# Patient Record
Sex: Male | Born: 1935
Health system: Southern US, Community
[De-identification: ages and names within clinical notes are randomized; demographics above are authoritative.]

## PROBLEM LIST (undated history)

## (undated) DIAGNOSIS — K219 Gastro-esophageal reflux disease without esophagitis: Secondary | ICD-10-CM

## (undated) DIAGNOSIS — E119 Type 2 diabetes mellitus without complications: Secondary | ICD-10-CM

## (undated) DIAGNOSIS — I2510 Atherosclerotic heart disease of native coronary artery without angina pectoris: Secondary | ICD-10-CM

## (undated) DIAGNOSIS — M1990 Unspecified osteoarthritis, unspecified site: Secondary | ICD-10-CM

## (undated) DIAGNOSIS — I219 Acute myocardial infarction, unspecified: Secondary | ICD-10-CM

## (undated) DIAGNOSIS — E785 Hyperlipidemia, unspecified: Secondary | ICD-10-CM

## (undated) DIAGNOSIS — Z8719 Personal history of other diseases of the digestive system: Secondary | ICD-10-CM

## (undated) DIAGNOSIS — I10 Essential (primary) hypertension: Secondary | ICD-10-CM

## (undated) DIAGNOSIS — G8929 Other chronic pain: Secondary | ICD-10-CM

## (undated) DIAGNOSIS — I639 Cerebral infarction, unspecified: Secondary | ICD-10-CM

## (undated) DIAGNOSIS — J069 Acute upper respiratory infection, unspecified: Secondary | ICD-10-CM

## (undated) DIAGNOSIS — G459 Transient cerebral ischemic attack, unspecified: Secondary | ICD-10-CM

## (undated) DIAGNOSIS — I723 Aneurysm of iliac artery: Secondary | ICD-10-CM

## (undated) DIAGNOSIS — M549 Dorsalgia, unspecified: Secondary | ICD-10-CM

## (undated) DIAGNOSIS — J45909 Unspecified asthma, uncomplicated: Secondary | ICD-10-CM

## (undated) DIAGNOSIS — I672 Cerebral atherosclerosis: Secondary | ICD-10-CM

## (undated) HISTORY — DX: Transient cerebral ischemic attack, unspecified: G45.9

## (undated) HISTORY — DX: Essential (primary) hypertension: I10

## (undated) HISTORY — PX: KNEE CARTILAGE SURGERY: SHX688

## (undated) HISTORY — DX: Cerebral atherosclerosis: I67.2

## (undated) HISTORY — DX: Atherosclerotic heart disease of native coronary artery without angina pectoris: I25.10

## (undated) HISTORY — PX: SHOULDER ARTHROSCOPY W/ ROTATOR CUFF REPAIR: SHX2400

## (undated) HISTORY — PX: ANAL FISSURE REPAIR: SHX2312

## (undated) HISTORY — DX: Hyperlipidemia, unspecified: E78.5

## (undated) HISTORY — PX: CATARACT EXTRACTION W/ INTRAOCULAR LENS  IMPLANT, BILATERAL: SHX1307

## (undated) HISTORY — DX: Aneurysm of iliac artery: I72.3

## (undated) HISTORY — DX: Unspecified asthma, uncomplicated: J45.909

## (undated) HISTORY — DX: Cerebral infarction, unspecified: I63.9

## (undated) HISTORY — DX: Unspecified osteoarthritis, unspecified site: M19.90

## (undated) HISTORY — DX: Acute upper respiratory infection, unspecified: J06.9

---

## 1994-08-03 DIAGNOSIS — I219 Acute myocardial infarction, unspecified: Secondary | ICD-10-CM

## 1994-08-03 HISTORY — DX: Acute myocardial infarction, unspecified: I21.9

## 1994-08-03 HISTORY — PX: CORONARY ANGIOPLASTY: SHX604

## 2002-01-30 ENCOUNTER — Encounter: Payer: Self-pay | Admitting: Family Medicine

## 2002-01-30 ENCOUNTER — Encounter: Admission: RE | Admit: 2002-01-30 | Discharge: 2002-01-30 | Payer: Self-pay | Admitting: Family Medicine

## 2002-02-21 ENCOUNTER — Encounter: Payer: Self-pay | Admitting: Specialist

## 2002-02-21 ENCOUNTER — Encounter: Admission: RE | Admit: 2002-02-21 | Discharge: 2002-02-21 | Payer: Self-pay | Admitting: Specialist

## 2002-02-23 ENCOUNTER — Ambulatory Visit (HOSPITAL_BASED_OUTPATIENT_CLINIC_OR_DEPARTMENT_OTHER): Admission: RE | Admit: 2002-02-23 | Discharge: 2002-02-24 | Payer: Self-pay | Admitting: Specialist

## 2002-04-06 ENCOUNTER — Encounter: Admission: RE | Admit: 2002-04-06 | Discharge: 2002-05-26 | Payer: Self-pay | Admitting: Specialist

## 2003-02-27 ENCOUNTER — Ambulatory Visit (HOSPITAL_COMMUNITY): Admission: RE | Admit: 2003-02-27 | Discharge: 2003-02-27 | Payer: Self-pay | Admitting: Gastroenterology

## 2005-10-07 ENCOUNTER — Ambulatory Visit: Payer: Self-pay | Admitting: Cardiology

## 2007-04-20 ENCOUNTER — Ambulatory Visit: Payer: Self-pay | Admitting: Cardiology

## 2007-05-24 ENCOUNTER — Ambulatory Visit: Payer: Self-pay

## 2008-08-08 ENCOUNTER — Ambulatory Visit: Payer: Self-pay | Admitting: Cardiology

## 2009-05-30 ENCOUNTER — Encounter (INDEPENDENT_AMBULATORY_CARE_PROVIDER_SITE_OTHER): Payer: Self-pay | Admitting: *Deleted

## 2009-06-04 ENCOUNTER — Encounter: Payer: Self-pay | Admitting: Cardiology

## 2009-06-04 DIAGNOSIS — I672 Cerebral atherosclerosis: Secondary | ICD-10-CM | POA: Insufficient documentation

## 2009-06-05 ENCOUNTER — Ambulatory Visit: Payer: Self-pay

## 2009-06-05 ENCOUNTER — Encounter: Payer: Self-pay | Admitting: Cardiovascular Disease

## 2009-06-05 ENCOUNTER — Encounter: Payer: Self-pay | Admitting: Cardiology

## 2009-06-05 DIAGNOSIS — I10 Essential (primary) hypertension: Secondary | ICD-10-CM | POA: Insufficient documentation

## 2009-06-05 DIAGNOSIS — E1159 Type 2 diabetes mellitus with other circulatory complications: Secondary | ICD-10-CM | POA: Insufficient documentation

## 2009-08-28 ENCOUNTER — Ambulatory Visit: Payer: Self-pay | Admitting: Cardiology

## 2009-08-28 DIAGNOSIS — E1169 Type 2 diabetes mellitus with other specified complication: Secondary | ICD-10-CM

## 2009-08-28 DIAGNOSIS — I2510 Atherosclerotic heart disease of native coronary artery without angina pectoris: Secondary | ICD-10-CM

## 2009-08-28 DIAGNOSIS — J069 Acute upper respiratory infection, unspecified: Secondary | ICD-10-CM | POA: Insufficient documentation

## 2009-08-28 DIAGNOSIS — I25110 Atherosclerotic heart disease of native coronary artery with unstable angina pectoris: Secondary | ICD-10-CM | POA: Insufficient documentation

## 2009-08-28 DIAGNOSIS — E785 Hyperlipidemia, unspecified: Secondary | ICD-10-CM | POA: Insufficient documentation

## 2010-06-10 ENCOUNTER — Telehealth (INDEPENDENT_AMBULATORY_CARE_PROVIDER_SITE_OTHER): Payer: Self-pay | Admitting: *Deleted

## 2010-07-04 ENCOUNTER — Encounter: Payer: Self-pay | Admitting: Cardiology

## 2010-07-17 ENCOUNTER — Encounter: Payer: Self-pay | Admitting: Cardiovascular Disease

## 2010-07-17 DIAGNOSIS — I723 Aneurysm of iliac artery: Secondary | ICD-10-CM

## 2010-07-18 ENCOUNTER — Encounter: Payer: Self-pay | Admitting: Cardiovascular Disease

## 2010-07-18 ENCOUNTER — Ambulatory Visit: Payer: Self-pay

## 2010-09-02 NOTE — Progress Notes (Signed)
Summary: Records Request   Faxed Carotid to [facility] ([phone]). Debby Freiberg  June 10, 2010 4:03 PM

## 2010-09-02 NOTE — Assessment & Plan Note (Signed)
Summary: [facility]  Medications Added METOPROLOL TARTRATE 100 MG TABS (METOPROLOL TARTRATE) 1 by mouth two times a day AMLODIPINE BESY-BENAZEPRIL HCL 5-10 MG CAPS (AMLODIPINE BESY-BENAZEPRIL HCL) 1 by mouth daily SIMCOR 1000-20 MG XR24H-TAB (NIACIN-SIMVASTATIN) 1 by mouth daily NITROSTAT 0.4 MG SUBL (NITROGLYCERIN) as needed CELEBREX 200 MG CAPS (CELECOXIB) as needed HYDROCHLOROTHIAZIDE 25 MG TABS (HYDROCHLOROTHIAZIDE) 1/2 by mouth daily MELOXICAM 15 MG TABS (MELOXICAM) 1/2 by mouth daily ADVAIR DISKUS 100-50 MCG/DOSE AEPB (FLUTICASONE-SALMETEROL) 2 puffs daily PROAIR HFA 108 (90 BASE) MCG/ACT AERS (ALBUTEROL SULFATE) as needed VITAMIN D 1000 UNIT TABS (CHOLECALCIFEROL) 1 by mouth weekly ASPIRIN 81 MG  TABS (ASPIRIN) 1 by mouth daily FISH OIL   OIL (FISH OIL) 2 by mouth dialy AZITHROMYCIN 1 GM PACK (AZITHROMYCIN) as directed1 AZITHROMYCIN 1 GM PACK (AZITHROMYCIN) as directed      Allergies Added: NKDA  Visit Type:  Follow-up Primary Provider:  Dr. Vernon Prey  CC:  CAD.  History of Present Illness: The patient presents for yearly followup. Since I last saw him he has had no new cardiovascular problems. 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 With this he denies any chest discomfort, neck or arm discomfort. He has none of the excessive diaphoresis or "hungry feeling" that was his angina at the time of his infarct. He denies any shortness of breath, PND or orthopnea. He has had a cough and wheezing for a couple of weeks. He denies any palpitations, presyncope or syncope. He's had no weight gain or edema.  Current Medications (verified): 1)  Metoprolol Tartrate 100 Mg Tabs (Metoprolol Tartrate) .Marland Kitchen.. 1 By Mouth Two Times A Day 2)  Amlodipine Besy-Benazepril Hcl 5-10 Mg Caps (Amlodipine Besy-Benazepril Hcl) .Marland Kitchen.. 1 By Mouth Daily 3)  Simcor 1000-20 Mg Xr24h-Tab (Niacin-Simvastatin) .Marland Kitchen.. 1 By Mouth Daily 4)  Nitrostat 0.4 Mg Subl (Nitroglycerin) .... As Needed 5)  Celebrex 200 Mg Caps  (Celecoxib) .... As Needed 6)  Hydrochlorothiazide 25 Mg Tabs (Hydrochlorothiazide) .... 1/2 By Mouth Daily 7)  Meloxicam 15 Mg Tabs (Meloxicam) .... 1/2 By Mouth Daily 8)  Advair Diskus 100-50 Mcg/dose Aepb (Fluticasone-Salmeterol) .... 2 Puffs Daily 9)  Proair Hfa 108 (90 Base) Mcg/act Aers (Albuterol Sulfate) .... As Needed 10)  Vitamin D 1000 Unit Tabs (Cholecalciferol) .Marland Kitchen.. 1 By Mouth Weekly 11)  Aspirin 81 Mg  Tabs (Aspirin) .Marland Kitchen.. 1 By Mouth Daily 12)  Fish Oil   Oil (Fish Oil) .... 2 By Mouth Dialy  Allergies (verified): No Known Drug Allergies  Past History:  Past Medical History: Reviewed history from 08/16/2009 and no changes required.  1. Coronary artery disease (myocardial infarction at [facility] in       1996, 90% mid LAD stenosis, 60-70% proximal circumflex stenosis,       sequential 50% stenosis, proximal mid right coronary artery.  He       had an atherectomy and angioplasty of the LAD.  He had normal       ejection fraction).   2. Asthma.   3. Hypertension.   4. Hyperlipidemia.   5. Bilateral carotid artery plaque.   6. Lens implant.   7. Right knee surgery.   8. Repair of a rectal fistula.   Past Surgical History: Lens implant.   Right knee surgery.   Repair of a rectal  fistula.   Review of Systems       As stated in the HPI and negative for all other systems.   Vital Signs:  Patient profile:   77 year old male  Height:      69 inches Weight:      207 pounds BMI:     30.68 Pulse rate:   63 / minute Resp:     18 per minute BP sitting:   142 / 80  (right arm)  Vitals Entered By: Marrion Coy, CNA (August 28, 2009 10:13 AM)  Physical Exam  General:  Well developed, well nourished, in no acute distress. Head:  normocephalic and atraumatic Eyes:  PERRLA/EOM intact; conjunctiva and lids normal. Neck:  Neck supple, no JVD. No masses, thyromegaly or abnormal cervical nodes. Chest Wall:  no deformities or breast masses noted Lungs:  diffuse  expiratory wheezes and decreased breath sounds no crackles Abdomen:  Bowel sounds positive; abdomen soft and non-tender without masses, organomegaly, or hernias noted. No hepatosplenomegaly. Msk:  Back normal, normal gait. Muscle strength and tone normal. Extremities:  No clubbing or cyanosis. Neurologic:  Alert and oriented x 3. Skin:  Intact without lesions or rashes. Axillary Nodes:  no significant adenopathy Inguinal Nodes:  no significant adenopathy Psych:  Normal affect.   Detailed Cardiovascular Exam  Neck    Carotids: Carotids full and equal bilaterally without bruits.      Neck Veins: Normal, no JVD.    Heart    Inspection: no deformities or lifts noted.      Palpation: normal PMI with no thrills palpable.      Auscultation: regular rate and rhythm, S1, S2 without murmurs, rubs, gallops, or clicks.    Vascular    Abdominal Aorta: no palpable masses, pulsations, or audible bruits.      Femoral Pulses: normal femoral pulses bilaterally.      Pedal Pulses: normal pedal pulses bilaterally.      Radial Pulses: normal radial pulses bilaterally.      Peripheral Circulation: no clubbing, cyanosis, or edema noted with normal capillary refill.     Impression & Recommendations:  Problem # 1:  CORONARY ATHEROSCLEROSIS NATIVE CORONARY ARTERY (ICD-414.01)  The patient is having no new symptoms. No further cardiovascular testing is suggested. He exerts himself vigorously daily and I will consider this his stress test. He does need continued aggressive risk reduction. Orders: EKG w/ Interpretation (93000)  His updated medication list for this problem includes:    Metoprolol Tartrate 100 Mg Tabs (Metoprolol tartrate) .Marland Kitchen... 1 by mouth two times a day    Amlodipine Besy-benazepril Hcl 5-10 Mg Caps (Amlodipine besy-benazepril hcl) .Marland Kitchen... 1 by mouth daily    Nitrostat 0.4 Mg Subl (Nitroglycerin) .Marland Kitchen... As needed    Aspirin 81 Mg Tabs (Aspirin) .Marland Kitchen... 1 by mouth daily  Problem # 2:   HYPERTENSION (ICD-401.9)  His updated medication list for this problem includes:    Metoprolol Tartrate 100 Mg Tabs (Metoprolol tartrate) .Marland Kitchen... 1 by mouth two times a day    Amlodipine Besy-benazepril Hcl 5-10 Mg Caps (Amlodipine besy-benazepril hcl) .Marland Kitchen... 1 by mouth daily    Hydrochlorothiazide 25 Mg Tabs (Hydrochlorothiazide) .Marland Kitchen... 1/2 by mouth daily    Aspirin 81 Mg Tabs (Aspirin) .Marland Kitchen... 1 by mouth daily  Problem # 3:  UPPER RESPIRATORY INFECTION (ICD-465.9)  The patient has been battling cough and congestion consistent with a possible bacterial upper respiratory infection for the past 10 days or so. I will go ahead and take the liberty of giving him a Z-Pak and asked him to follow with his primary physician.  His updated medication list for this problem includes:    Advair Diskus 100-50 Mcg/dose Aepb (Fluticasone-salmeterol) .Marland Kitchen... 2 puffs  daily    Proair Hfa 108 (90 Base) Mcg/act Aers (Albuterol sulfate) .Marland Kitchen... As needed    Azithromycin 1 Gm Pack (Azithromycin) .Marland Kitchen... As directed  Problem # 4:  DYSLIPIDEMIA (ICD-272.4)  I reviewed his lipid profile. His LDL was 61 and HDL 45. This is excellent .  His updated medication list for this problem includes:    Simcor 1000-20 Mg Xr24h-tab (Niacin-simvastatin) .Marland Kitchen... 1 by mouth daily  Patient Instructions: 1)  Your physician recommends that you schedule a follow-up appointment in: 1 yr in [place] with Dr Antoine Poche 2)  Your physician recommends that you continue on your current medications as directed. Please refer to the Current Medication list given to you today. Prescriptions: AZITHROMYCIN 1 GM PACK (AZITHROMYCIN) as directed  #1 x 0   Entered by:   Charolotte Capuchin, RN   Authorized by:   Rollene Rotunda, MD, [facility]   Signed by:   Charolotte Capuchin, RN on 08/28/2009   Method used:   Electronically to        [facility] [phone]* (retail)       [address] [place].       [place], [place]  [address]       Ph: [phone]       Fax:  [phone]   RxID:   [id number] AZITHROMYCIN 1 GM PACK (AZITHROMYCIN) as directed1  #1 x 0   Entered by:   Charolotte Capuchin, RN   Authorized by:   Rollene Rotunda, MD, [facility]   Signed by:   Charolotte Capuchin, RN on 08/28/2009   Method used:   Electronically to        [facility] [phone]* (retail)       [address] [place].       [place], [place]  [address]       Ph: [phone]       Fax: [phone]   RxID:   [id number]

## 2010-09-04 NOTE — Miscellaneous (Signed)
Summary: Orders Update  Clinical Lists Changes  Problems: Added new problem of ILIAC ARTERY ANEURYSM (ICD-442.2) Orders: Added new Test order of Abdominal Aorta Duplex (Abd Aorta Duplex) - Signed

## 2010-11-19 ENCOUNTER — Ambulatory Visit (INDEPENDENT_AMBULATORY_CARE_PROVIDER_SITE_OTHER): Payer: Self-pay | Admitting: Cardiology

## 2010-11-19 ENCOUNTER — Encounter: Payer: Self-pay | Admitting: Cardiology

## 2010-11-19 VITALS — BP 156/84 | HR 64 | Ht 72.0 in | Wt 210.0 lb

## 2010-11-19 DIAGNOSIS — I672 Cerebral atherosclerosis: Secondary | ICD-10-CM

## 2010-11-19 DIAGNOSIS — I10 Essential (primary) hypertension: Secondary | ICD-10-CM

## 2010-11-19 DIAGNOSIS — I723 Aneurysm of iliac artery: Secondary | ICD-10-CM

## 2010-11-19 DIAGNOSIS — I2510 Atherosclerotic heart disease of native coronary artery without angina pectoris: Secondary | ICD-10-CM

## 2010-11-19 DIAGNOSIS — E785 Hyperlipidemia, unspecified: Secondary | ICD-10-CM

## 2010-11-19 MED ORDER — HYDROCHLOROTHIAZIDE 25 MG PO TABS: ORAL_TABLET | ORAL | Status: DC

## 2010-11-19 NOTE — Assessment & Plan Note (Signed)
I do not have a copy of the most recent lipids but this is usually followed expertly by his primary providers and I will defer to their management.

## 2010-11-19 NOTE — Assessment & Plan Note (Signed)
He had a mild plaque bilateral November 2010 and is due for followup again this winter.

## 2010-11-19 NOTE — Assessment & Plan Note (Addendum)
I reviewed his BP diary and his BPs are for the most part less than 140/90.  Of note  he had been instructed to increase his hydrochlorothiazide to 25 mg Monday Wednesday and Friday and I reviewed this with him and he will make this change.

## 2010-11-19 NOTE — Patient Instructions (Signed)
Increase Hydrochlorothiazide to 25 mg a day on Monday, Wednesday and Friday.  Continue other medications as ordered. You are being scheduled for a Lexiscan myoview.  Please follow the instruction sheet given. You are to have a carotid doppler and Abdominal ultrasound in November.  You will be called with an appointment however if you do not receive a call, please call the office at [phone] to schedule. Follow up with Dr Antoine Poche in 1 year in the [place] office.

## 2010-11-19 NOTE — Assessment & Plan Note (Signed)
He had small abdominal aortic aneurysm and iliac aneurysms and is due for followup this December

## 2010-11-19 NOTE — Progress Notes (Signed)
HPI The patient presents for followup of his known coronary disease. Since I last saw him he has had no acute complaints. He tries to stay active he somewhat limited by knee pain. 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 He does not have palpitations, presyncope or syncope. He has not had any new shortness of breath, PND or orthopnea. He has had no weight gain or edema.   Allergies  Allergen Reactions  . Codeine Other (See Comments)    Bad dreams    Current Outpatient Prescriptions  Medication Sig Dispense Refill  . Albuterol Sulfate (PROAIR HFA IN) Inhale into the lungs as directed.        Marland Kitchen amLODipine-benazepril (LOTREL) 5-10 MG per capsule Take 1 capsule by mouth daily.        Marland Kitchen aspirin 81 MG tablet Take 81 mg by mouth daily.        . Cholecalciferol (VITAMIN D3) 1000 UNITS CAPS Take by mouth once a week.        . fish oil-omega-3 fatty acids 1000 MG capsule Take 2 g by mouth daily.        . Fluticasone-Salmeterol (ADVAIR DISKUS) 100-50 MCG/DOSE AEPB Inhale 1 puff into the lungs every 12 (twelve) hours.        . hydrochlorothiazide 25 MG tablet Take 12.5 mg by mouth daily.        . meloxicam (MOBIC) 15 MG tablet Take 7.5 mg by mouth daily.        . metFORMIN (GLUMETZA) 500 MG (MOD) 24 hr tablet Take 500 mg by mouth daily with breakfast.        . metoprolol (LOPRESSOR) 100 MG tablet Take 100 mg by mouth 2 (two) times daily.        . niacin (NIASPAN) 1000 MG CR tablet Take 1,000 mg by mouth at bedtime.        . nitroGLYCERIN (NITROSTAT) 0.4 MG SL tablet Place 0.4 mg under the tongue every 5 (five) minutes as needed.        . rosuvastatin (CRESTOR) 10 MG tablet Take 10 mg by mouth daily.        . celecoxib (CELEBREX) 200 MG capsule Take 200 mg by mouth as needed.        Marland Kitchen DISCONTD: niacin-simvastatin (SIMCOR) 1000-20 MG 24 hr tablet Take 1 tablet by mouth daily.          Past Medical History  Diagnosis Date  .  DYSLIPIDEMIA   . HYPERTENSION   . CORONARY ATHEROSCLEROSIS NATIVE CORONARY ARTERY   . Cerebral atherosclerosis   . UPPER RESPIRATORY INFECTION   . ILIAC ARTERY ANEURYSM     Past Surgical History  Procedure Date  . Intraocular lens insertion   . Right knee surgery   . Repair of rectal fistula     ROS: As stated in the HPI and negative for all other systems.  PHYSICAL EXAM BP 156/84  Pulse 64  Ht 6' (1.829 m)  Wt 210 lb (95.255 kg)  BMI 28.48 kg/m2 GENERAL:  Well appearing HEENT:  Pupils equal round and reactive, fundi not visualized, oral mucosa unremarkable NECK:  No jugular venous distention, waveform within normal limits, carotid upstroke brisk and symmetric, right bruit, no thyromegaly LYMPHATICS:  No cervical, inguinal adenopathy LUNGS:  Clear to auscultation bilaterally BACK:  No CVA tenderness CHEST:  Unremarkable HEART:  PMI not displaced or sustained,S1 and S2 within normal limits, no S3, no  S4, no clicks, no rubs, no murmurs ABD:  Flat, positive bowel sounds normal in frequency in pitch, no bruits, no rebound, no guarding, no midline pulsatile mass, no hepatomegaly, no splenomegaly EXT:  2 plus pulses throughout, no edema, no cyanosis no clubbing SKIN:  No rashes no nodules NEURO:  Cranial nerves II through XII grossly intact, motor grossly intact throughout PSYCH:  Cognitively intact, oriented to person place and time  EKG:  Sinus rhythm, rate 65, axis within normal limits, intervals within normal, no acute ST-T wave changes.  ASSESSMENT AND PLAN

## 2010-11-19 NOTE — Assessment & Plan Note (Signed)
She is not as physically active as he used to be. It has been many years since his PCI and stenting of stress testing. This is indicated that he would not be a little on the treadmill so he will have pharmacologic perfusion imaging.

## 2010-11-20 ENCOUNTER — Encounter: Payer: Self-pay | Admitting: Cardiology

## 2010-11-20 NOTE — Progress Notes (Signed)
Addended by: Deliah Goody on: 11/20/2010 02:06 PM   Modules accepted: Orders

## 2010-12-02 ENCOUNTER — Encounter (HOSPITAL_COMMUNITY): Payer: Self-pay | Admitting: Radiology

## 2010-12-16 NOTE — Assessment & Plan Note (Signed)
[facility]                            CARDIOLOGY OFFICE NOTE   CORNEL, [name]                         MRN:          [id number]  DATE:08/08/2008                            DOB:          [birth year]    PRIMARY CARE PHYSICIAN:  Ernestina Penna, MD.   REASON FOR PRESENTATION:  Evaluate the patient with coronary artery  disease, hypertension, and dyslipidemia.   HISTORY OF PRESENT ILLNESS:  The patient presents for followup of the  above.  This is his yearly followup.  He is now 77 years old.  In the  last year, he has done well.  He does not exercise as much as I would  like, though he occasionally pedals a bicycle.  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  With this level of activity, he does not get any chest  pressure, neck, or arm discomfort.  He does not have any palpitation,  presyncope, or syncope.  He has no PND or orthopnea.  He has noted that  his blood pressure at home is running, elevated in the 140s-150s  routinely.   PAST MEDICAL HISTORY:  1. Coronary artery disease (myocardial infarction at [facility] in      1996, 90% mid LAD stenosis, 60-70% proximal circumflex stenosis,      sequential 50% stenosis, proximal mid right coronary artery.  He      had an atherectomy and angioplasty of the LAD.  He had normal      ejection fraction).  2. Asthma.  3. Hypertension.  4. Hyperlipidemia.  5. Bilateral carotid artery plaque.  6. Lens implant.  7. Right knee surgery.  8. Repair of a rectal fistula.   ALLERGIES:  Voltaren.   MEDICATIONS:  1. Metoprolol 100 mg b.i.d.  2. Advair.  3. Aspirin 81 mg daily.  4. Vitamin E.  5. Hydrochlorothiazide.  6. Fish oil.  7. Prilosec.  8. Advicor 1000/20.  9. Lotrel 5/10.  10.Mobic 7.5 mg daily.   REVIEW OF SYSTEMS:  As stated in the HPI and otherwise negative for all  other systems.   PHYSICAL EXAMINATION:  GENERAL:  The patient is pleasant and in no  distress.  VITAL SIGNS:  Blood pressure  142/76, heart rate 66 and regular, and  weight 215 pounds.  HEENT:  Eyelids unremarkable.  Pupils equal, round, and reactive to  light.  Fundi not visualized.  Oral mucosa unremarkable.  NECK:  No jugular venous distention at 45 degrees.  Carotid upstroke  brisk and symmetrical.  No bruits.  No thyromegaly.  LYMPHATICS:  No cervical, axillary, or inguinal adenopathy.  LUNGS:  Clear to auscultation bilaterally.  BACK:  No costovertebral angle tenderness.  CHEST:  Unremarkable.  HEART:  PMI not displaced or sustained.  S1 and S2 within normal limits.  No S3, no S4.  No clicks, no rubs, no murmurs.  ABDOMEN:  Obese, positive bowel sounds.  Normal in frequency and pitch.  No bruits, no rebound, no guarding or midline pulsatile mass.  No  hepatomegaly.  No splenomegaly.  SKIN:  No rashes, no nodules,  some skin cancers with one on his left  forearm.  NEUROLOGIC:  Oriented to person, place, and time.  Cranial nerves II  through XII grossly intact.  Motor grossly intact.   EKG, sinus rhythm, rate 66, axis within normal limits, intervals within  normal limits, no acute ST-T wave changes.   ASSESSMENT AND PLAN:  1. Coronary artery disease.  The patient is having no ongoing angina.      At this point, no further cardiovascular testing is suggested.      Rather, he should continue with aggressive risk reduction.  2. Hypertension.  Blood pressure is elevated and not at target.      Therefore, I am going to take the liberty of increasing his Lotrel      to 5/20.  When he starts this 2 weeks later, he will get a BMET.      Its okay for him to use his 2 weeks of the 5/10 prescription first.  3. Dyslipidemia.  This is followed closely by Dr. Christell Constant with a goal      LDL less than 100 and HDL greater than 40.  4. Weight.  The patient is overweight.  This has been stable.  We have      discussed the need for weight loss.  His body mass index is 29.  5. Followup.  I think he could come back in 1 year or  sooner if      needed.     Rollene Rotunda, MD, [facility]  Electronically Signed    JH/MedQ  DD: 08/08/2008  DT: 08/09/2008  Job #: [id number]   cc:   Ernestina Penna, M.D.

## 2010-12-16 NOTE — Assessment & Plan Note (Signed)
[facility]                            CARDIOLOGY OFFICE NOTE   RANGEL, ECHEVERRI                         MRN:          [id number]  DATE:04/20/2007                            DOB:          [birth year]    PRIMARY:  Ernestina Penna, M.D.   REASON FOR PRESENTATION:  Evaluate the patient with coronary artery  disease.   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  He returns for 28-month followup.  Since I last saw him, he  has been doing relatively well.  He has gained about 15 pounds because  he eats too much.  He does do work and raises horses.  He gets on an  exercise bike that he keeps in his yard.  He does that a couple of times  a week for about 12 minutes.  He says with this level of activity,  pedaling the bicycle quickly, he does not get any chest discomfort, neck  or arm discomfort.  He does not have any palpitations, pre-syncope, or  syncope.  He denies any PND or orthopnea.   PAST MEDICAL HISTORY:  Coronary artery disease (myocardial infarction at  [facility] in 1996, 90% mid LAD stenosis, 60-70% proximal circumflex  stenosis, sequential 50% stenosis, proximal mid right coronary artery.  He had atherectomy and angioplasty of the LAD.  He had a normal ejection  fraction).  Asthma.  Hypertension.  Hyperlipidemia.  Bilateral cataract  artery plaque.  Lens implant.  Right knee surgery.  Repair of a rectal  fistula.   ALLERGIES:  VOLTAREN.   MEDICATIONS:  1. Advicor 1000/20 mg daily.  2. Hydrochlorothiazide 12 mg daily.  3. Metoprolol 100 mg b.i.d.  4. Fish oil.  5. Aspirin 325 mg daily.  6. Mucinex.  7. Celebrex.  8. Prilosec.  9. Lotrel 5/10 daily.  10.Glucosamine.  11.Vitamin C and D.   REVIEW OF SYSTEMS:  As stated in the HPI and otherwise negative for  other systems.   PHYSICAL EXAMINATION:  The patient is in no distress.  Blood pressure 136/78, heart rate 64 and regular, weight 215  pounds,  body mass index 29.  HEENT:  Eyelids unremarkable.  Pupils are equal, round, and reactive to  light and accommodation.  Fundi are not visualized.  Oral mucosa  unremarkable.  NECK:  No jugular venous distension at 45 degrees, carotid upstroke  brisk and symmetric, right carotid bruit, no thyromegaly.  LYMPHATICS:  No cervical, axillary, or inguinal adenopathy.  LUNGS:  Clear to auscultation bilaterally.  BACK:  No costovertebral angle tenderness.  CHEST:  Unremarkable.  HEART:  PMI not displaced or sustained, S1 and S2 within normal limits,  no S3, no murmurs.  ABDOMEN:  Obese, positive bowel sounds, normal in frequency and pitch,  no bruits, rebound, guarding.  No midline pulsatile masses,  hepatomegaly, splenomegaly.  SKIN:  No rashes, no nodules.  EXTREMITIES:  With 2+ pulses throughout, no edema, cyanosis, clubbing.  NEURO:  Oriented to person, place, and time, cranial nerves 2-12 grossly  intact, motor grossly intact throughout.  EKG:  Sinus rhythm.  Rate 64.  Axis within normal limits, intervals  within normal limits.  No acute ST-T wave changes.   ASSESSMENT AND PLAN:  1. Coronary artery disease.  The patient is doing well with respect to      this.  He will continue with aggressive secondary risk reduction.      He is not exercising as much as I would suggest, and I outlined a      plan to him.  2. Carotid bruit.  It has been 3 years since his last carotid Doppler,      at which point he had 0-39% bilateral stenosis.  I will repeat      this.  3. Dyslipidemia per Dr. Christell Constant with a goal LDL of less than 100 and HDL      greater than 40.  4. Obesity.  We discussed the need to lose weight with diet and      exercise, and I gave him some specific instructions.  5. Hypertension.  Blood pressure is well controlled and he will      continue the other medications as listed.     Rollene Rotunda, MD, [facility]  Electronically Signed    JH/MedQ  DD: 04/20/2007  DT:  04/20/2007  Job #: [id number]

## 2011-01-07 ENCOUNTER — Ambulatory Visit (HOSPITAL_COMMUNITY): Payer: Medicare Other | Attending: Cardiology | Admitting: Radiology

## 2011-01-07 VITALS — Ht 71.0 in | Wt 201.0 lb

## 2011-01-07 DIAGNOSIS — I2510 Atherosclerotic heart disease of native coronary artery without angina pectoris: Secondary | ICD-10-CM | POA: Insufficient documentation

## 2011-01-07 MED ORDER — TECHNETIUM TC 99M TETROFOSMIN IV KIT
11.0000 | PACK | Freq: Once | INTRAVENOUS | Status: AC | PRN
  Administered 2011-01-07: 11 via INTRAVENOUS

## 2011-01-07 MED ORDER — REGADENOSON 0.4 MG/5ML IV SOLN
0.4000 mg | Freq: Once | INTRAVENOUS | Status: AC
  Administered 2011-01-07: 0.4 mg via INTRAVENOUS

## 2011-01-07 MED ORDER — TECHNETIUM TC 99M TETROFOSMIN IV KIT
33.0000 | PACK | Freq: Once | INTRAVENOUS | Status: AC | PRN
  Administered 2011-01-07: 33 via INTRAVENOUS

## 2011-01-07 NOTE — Progress Notes (Addendum)
[facility] SITE 3 NUCLEAR MED [address] [place] [address] [phone]  Cardiology Nuclear Med Study  [name] is a 77 y.o. male [id number] [birth year]   Nuclear Med Background Indication for Stress Test:  Evaluation for Ischemia and PTCA Patency History:  ~16 yrs ago MI>PTCA, h/o small AAA Cardiac Risk Factors: Carotid Disease, Hypertension, Lipids, NIDDM, PVD and TIA  Symptoms:  No cardiac complaints.   Nuclear Pre-Procedure Caffeine/Decaff Intake:  None NPO After: 9:00pm   Lungs:  Clear.  O2 sat 94% on RA. IV 0.9% NS with Angio Cath:  20g  IV Site: R Antecubital  IV Started by:  Bonnita Levan, RN  Height: 5\' 11"  (1.803 m)  Weight:  201 lb (91.173 kg)  BMI:  Body mass index is 28.03 kg/(m^2). Tech Comments:  Patient held Metformin and Metoprolol this AM    Nuclear Med Study 1 or 2 day study: 1 day  Stress Test Type:  Eugenie Birks  Reading MD: Charlton Haws, MD  Order Authorizing Provider:  Dr. Daiva Nakayama  Resting Radionuclide: Technetium 23m Tetrofosmin  Resting Radionuclide Dose: 11 mCi   Stress Radionuclide:  Technetium 45m Tetrofosmin  Stress Radionuclide Dose: 33 mCi           Stress Protocol Rest HR: 70 Stress HR: 84  Rest BP: 141/81 Stress BP: 145/92  Exercise Time (min): n/a METS: n/a   Predicted Max HR: 142 bpm % Max HR: 59.15 bpm Rate Pressure Product: 32440   Dose of Adenosine (mg):  n/a Dose of Lexiscan: 0.4 mg  Dose of Atropine (mg): n/a Dose of Dobutamine: n/a mcg/kg/min (at max HR)  Stress Test Technologist: Smiley Houseman, CMA-N  Nuclear Technologist:  Doyne Keel, CNMT     Rest Procedure:  Myocardial perfusion imaging was performed at rest 45 minutes following the intravenous administration of Technetium 44m Tetrofosmin.  Rest ECG: No acute changes.  Stress Procedure:  The patient received IV Lexiscan 0.4 mg over 15-seconds.  Technetium 36m Tetrofosmin injected at 30-seconds.  There were no significant changes with  Lexiscan, other than occasional PVC's.  Quantitative spect images were obtained after a 45 minute delay.  Stress ECG: No significant change from baseline ECG  QPS Raw Data Images:  Normal; no motion artifact; normal heart/lung ratio. Stress Images:  Normal homogeneous uptake in all areas of the myocardium. Rest Images:  Normal homogeneous uptake in all areas of the myocardium. Subtraction (SDS):  Normal Transient Ischemic Dilatation (Normal <1.22):.95 Lung/Heart Ratio (Normal <0.45):  .33   Quantitative Gated Spect Images QGS EDV:  72 ml QGS ESV:  24 ml QGS cine images:  NL LV Function; NL Wall Motion QGS EF: 67%  Impression Exercise Capacity:  Lexiscan with no exercise. BP Response:  Normal blood pressure response. Clinical Symptoms:  There is dyspnea. ECG Impression:  No significant ST segment change suggestive of ischemia. Comparison with Prior Nuclear Study: No previous nuclear study performed  Overall Impression:  Normal stress nuclear study.  Charlton Haws    No evidence of ischemia or infarct.  No further work up.  Rollene Rotunda

## 2011-01-08 NOTE — Progress Notes (Signed)
COPY ROUTED TO DR. HOCHREIN °

## 2011-01-14 NOTE — Progress Notes (Signed)
Pt's wife aware of results and will convey them to the patient.

## 2011-06-08 ENCOUNTER — Other Ambulatory Visit: Payer: Self-pay | Admitting: Cardiology

## 2011-07-08 ENCOUNTER — Other Ambulatory Visit: Payer: Self-pay | Admitting: Cardiology

## 2011-07-08 DIAGNOSIS — I714 Abdominal aortic aneurysm, without rupture: Secondary | ICD-10-CM

## 2011-07-20 ENCOUNTER — Encounter: Payer: Medicare Other | Admitting: Cardiology

## 2011-07-20 ENCOUNTER — Encounter: Payer: Medicare Other | Admitting: *Deleted

## 2011-08-20 ENCOUNTER — Encounter (INDEPENDENT_AMBULATORY_CARE_PROVIDER_SITE_OTHER): Payer: Medicare Other | Admitting: Cardiology

## 2011-08-20 DIAGNOSIS — I714 Abdominal aortic aneurysm, without rupture: Secondary | ICD-10-CM

## 2011-08-31 ENCOUNTER — Other Ambulatory Visit: Payer: Self-pay | Admitting: *Deleted

## 2011-08-31 DIAGNOSIS — I6529 Occlusion and stenosis of unspecified carotid artery: Secondary | ICD-10-CM

## 2011-09-01 ENCOUNTER — Encounter (INDEPENDENT_AMBULATORY_CARE_PROVIDER_SITE_OTHER): Payer: Medicare Other | Admitting: *Deleted

## 2011-09-01 DIAGNOSIS — I6529 Occlusion and stenosis of unspecified carotid artery: Secondary | ICD-10-CM

## 2011-11-25 ENCOUNTER — Ambulatory Visit (INDEPENDENT_AMBULATORY_CARE_PROVIDER_SITE_OTHER): Payer: Medicare Other | Admitting: Cardiology

## 2011-11-25 ENCOUNTER — Encounter: Payer: Self-pay | Admitting: Cardiology

## 2011-11-25 VITALS — BP 130/80 | HR 66 | Ht 72.0 in | Wt 208.0 lb

## 2011-11-25 DIAGNOSIS — E785 Hyperlipidemia, unspecified: Secondary | ICD-10-CM

## 2011-11-25 DIAGNOSIS — I10 Essential (primary) hypertension: Secondary | ICD-10-CM

## 2011-11-25 DIAGNOSIS — I2510 Atherosclerotic heart disease of native coronary artery without angina pectoris: Secondary | ICD-10-CM

## 2011-11-25 DIAGNOSIS — I739 Peripheral vascular disease, unspecified: Secondary | ICD-10-CM

## 2011-11-25 NOTE — Progress Notes (Signed)
HPI The patient presents for followup of his known coronary disease. Since I last saw him he has had no acute complaints. He tries to stay active he somewhat limited by knee pain. He works his tobacco plants which requires vigorous activity. With this he has no symptoms.  The patient denies any new symptoms such as chest discomfort, neck or arm discomfort. There has been no new shortness of breath, PND or orthopnea. There have been no reported palpitations, presyncope or syncope. He has had no weight gain or edema and in fact has continued to lose weight.   Allergies  Allergen Reactions  . Codeine Other (See Comments)    Bad dreams    Current Outpatient Prescriptions  Medication Sig Dispense Refill  . Albuterol Sulfate (PROAIR HFA IN) Inhale into the lungs as directed.        Marland Kitchen amLODipine-benazepril (LOTREL) 5-10 MG per capsule TAKE ONE CAPSULE BY MOUTH EVERY DAY  30 capsule  6  . aspirin 81 MG tablet Take 81 mg by mouth daily.        . fish oil-omega-3 fatty acids 1000 MG capsule Take 2 g by mouth daily.        . Fluticasone-Salmeterol (ADVAIR DISKUS) 100-50 MCG/DOSE AEPB Inhale 1 puff into the lungs every 12 (twelve) hours.        . hydrochlorothiazide 25 MG tablet Take 1/2 tablet daily except for Monday, Wednesday and Friday - then take one tablet daily  30 tablet  11  . meloxicam (MOBIC) 15 MG tablet Take 7.5 mg by mouth daily.        . metFORMIN (GLUMETZA) 500 MG (MOD) 24 hr tablet Take 500 mg by mouth daily with breakfast.        . metoprolol (LOPRESSOR) 100 MG tablet Take 100 mg by mouth 2 (two) times daily.        . niacin (NIASPAN) 1000 MG CR tablet Take 1,000 mg by mouth at bedtime.        . nitroGLYCERIN (NITROSTAT) 0.4 MG SL tablet Place 0.4 mg under the tongue every 5 (five) minutes as needed.        . rosuvastatin (CRESTOR) 10 MG tablet Take 10 mg by mouth daily.          Past Medical History  Diagnosis Date  . DYSLIPIDEMIA   . HYPERTENSION   . CORONARY ATHEROSCLEROSIS  NATIVE CORONARY ARTERY   . Cerebral atherosclerosis   . UPPER RESPIRATORY INFECTION   . ILIAC ARTERY ANEURYSM     Past Surgical History  Procedure Date  . Intraocular lens insertion   . Right knee surgery   . Repair of rectal fistula     ROS: As stated in the HPI and negative for all other systems.  PHYSICAL EXAM BP 130/80  Pulse 66  Ht 6' (1.829 m)  Wt 208 lb (94.348 kg)  BMI 28.21 kg/m2 GENERAL:  Well appearing HEENT:  Pupils equal round and reactive, fundi not visualized, oral mucosa unremarkable NECK:  No jugular venous distention, waveform within normal limits, carotid upstroke brisk and symmetric, right bruit, no thyromegaly LYMPHATICS:  No cervical, inguinal adenopathy LUNGS:  Clear to auscultation bilaterally BACK:  No CVA tenderness CHEST:  Unremarkable HEART:  PMI not displaced or sustained,S1 and S2 within normal limits, no S3, no S4, no clicks, no rubs, soft apical early peaking systolic murmur ABD:  Flat, positive bowel sounds normal in frequency in pitch, no bruits, no rebound, no guarding, no midline pulsatile mass,  no hepatomegaly, no splenomegaly EXT:  2 plus pulses throughout, no edema, no cyanosis no clubbing SKIN:  No rashes no nodules NEURO:  Cranial nerves II through XII grossly intact, motor grossly intact throughout PSYCH:  Cognitively intact, oriented to person place and time  EKG:  Sinus rhythm, rate 66, axis within normal limits, intervals within normal, no acute ST-T wave changes.  11/25/2011   ASSESSMENT AND PLAN

## 2011-11-25 NOTE — Patient Instructions (Signed)
The current medical regimen is effective;  continue present plan and medications.  Follow up in 1 year with Dr. Hochrein in [place].  You will receive a letter in the mail 2 months before you are due.  Please call us when you receive this letter to schedule your follow up appointment.  

## 2011-11-25 NOTE — Assessment & Plan Note (Signed)
His LDL and HDL were both 42. He will continue with the meds as listed.

## 2011-11-25 NOTE — Assessment & Plan Note (Signed)
I reviewed his for this and he is down ultrasound. He has mild carotid plaque a small aneurysm but will be followed in 2 years.

## 2011-11-25 NOTE — Assessment & Plan Note (Signed)
He had a stress test last year. At this point he will continue with risk reduction. No change in therapy is indicated.

## 2011-11-25 NOTE — Assessment & Plan Note (Signed)
The blood pressure is at target. No change in medications is indicated. We will continue with therapeutic lifestyle changes (TLC).  

## 2012-01-08 ENCOUNTER — Other Ambulatory Visit: Payer: Self-pay | Admitting: Cardiology

## 2012-06-02 ENCOUNTER — Encounter: Payer: Self-pay | Admitting: Cardiology

## 2012-06-25 ENCOUNTER — Other Ambulatory Visit: Payer: Self-pay | Admitting: Cardiology

## 2012-10-04 ENCOUNTER — Encounter: Payer: Self-pay | Admitting: Cardiology

## 2012-11-25 ENCOUNTER — Other Ambulatory Visit: Payer: Self-pay | Admitting: Family Medicine

## 2012-12-27 ENCOUNTER — Other Ambulatory Visit: Payer: Self-pay | Admitting: Nurse Practitioner

## 2012-12-27 ENCOUNTER — Other Ambulatory Visit: Payer: Self-pay | Admitting: Family Medicine

## 2012-12-28 ENCOUNTER — Ambulatory Visit (INDEPENDENT_AMBULATORY_CARE_PROVIDER_SITE_OTHER): Payer: Medicare Other | Admitting: Cardiology

## 2012-12-28 ENCOUNTER — Encounter: Payer: Self-pay | Admitting: Cardiology

## 2012-12-28 ENCOUNTER — Encounter: Payer: Self-pay | Admitting: *Deleted

## 2012-12-28 VITALS — BP 138/76 | HR 64 | Ht 71.0 in | Wt 207.8 lb

## 2012-12-28 DIAGNOSIS — I739 Peripheral vascular disease, unspecified: Secondary | ICD-10-CM

## 2012-12-28 DIAGNOSIS — I2510 Atherosclerotic heart disease of native coronary artery without angina pectoris: Secondary | ICD-10-CM

## 2012-12-28 NOTE — Progress Notes (Signed)
HPI The patient presents for followup of his known coronary disease. Since I last saw him he has had no acute complaints. He tries to stay active he somewhat limited by knee pain. He works his farm. With this he has no symptoms.  The patient denies any new symptoms such as chest discomfort, neck or arm discomfort. There has been no new shortness of breath, PND or orthopnea. There have been no reported palpitations, presyncope or syncope. He has had no weight gain or edema.   Allergies  Allergen Reactions  . Codeine Other (See Comments)    Bad dreams    Current Outpatient Prescriptions  Medication Sig Dispense Refill  . ADVAIR DISKUS 100-50 MCG/DOSE AEPB INHALE ONE DOSE BY MOUTH EVERY 12 HOURS AS NEEDED  60 each  4  . Albuterol Sulfate (PROAIR HFA IN) Inhale into the lungs as directed.        Marland Kitchen amLODipine-benazepril (LOTREL) 5-10 MG per capsule TAKE ONE CAPSULE BY MOUTH EVERY DAY  30 capsule  6  . ANDROGEL PUMP 20.25 MG/ACT (1.62%) GEL       . aspirin 81 MG tablet Take 81 mg by mouth daily.        . cholecalciferol (VITAMIN D) 1000 UNITS tablet Take 1,000 Units by mouth daily.      . fish oil-omega-3 fatty acids 1000 MG capsule Take 2 g by mouth daily.        . hydrochlorothiazide (HYDRODIURIL) 25 MG tablet TAKE ONE-HALF TABLET BY MOUTH EVERY DAY EXCEPT  ON MONDAY, WEDNESDAY AND FRIDAY TAKE ONE  TABLET BY MOUTH  30 tablet  2  . ibuprofen (ADVIL,MOTRIN) 200 MG tablet Take 200 mg by mouth daily.      . metFORMIN (GLUMETZA) 500 MG (MOD) 24 hr tablet Take 500 mg by mouth daily with breakfast.        . metoprolol (LOPRESSOR) 100 MG tablet TAKE ONE TABLET BY MOUTH TWICE DAILY  60 tablet  4  . niacin (NIASPAN) 1000 MG CR tablet Take 1,000 mg by mouth at bedtime.        . nitroGLYCERIN (NITROSTAT) 0.4 MG SL tablet Place 0.4 mg under the tongue every 5 (five) minutes as needed.        . rosuvastatin (CRESTOR) 10 MG tablet Take 10 mg by mouth daily.         No current facility-administered  medications for this visit.    Past Medical History  Diagnosis Date  . DYSLIPIDEMIA   . HYPERTENSION   . CORONARY ATHEROSCLEROSIS NATIVE CORONARY ARTERY     Myocardial infarction at [facility] in 1996 with a direct hernia the 90% LAD stenosis and medical management of 60-70% proximal circumflex and 50% RCA stenosis.  . Cerebral atherosclerosis   . UPPER RESPIRATORY INFECTION   . ILIAC ARTERY ANEURYSM     Past Surgical History  Procedure Laterality Date  . Intraocular lens insertion    . Right knee surgery    . Repair of rectal fistula      ROS: As stated in the HPI and negative for all other systems.  PHYSICAL EXAM BP 138/76  Pulse 64  Ht 5\' 11"  (1.803 m)  Wt 207 lb 12.8 oz (94.257 kg)  BMI 28.99 kg/m2 GENERAL:  Well appearing HEENT:  Pupils equal round and reactive, fundi not visualized, oral mucosa unremarkable NECK:  No jugular venous distention, waveform within normal limits, carotid upstroke brisk and symmetric,soft  right bruit, no thyromegaly LYMPHATICS:  No cervical, inguinal  adenopathy LUNGS:  Clear to auscultation bilaterally BACK:  No CVA tenderness CHEST: Unremarkable HEART:  PMI not displaced or sustained,S1 and S2 within normal limits, no S3, no S4, no clicks, no rubs, soft apical early peaking systolic murmur ABD:  Flat, positive bowel sounds normal in frequency in pitch, no bruits, no rebound, no guarding, no midline pulsatile mass, no hepatomegaly, no splenomegaly EXT:  2 plus pulses throughout, no edema, no cyanosis no clubbing SKIN:  No rashes no nodules NEURO:  Cranial nerves II through XII grossly intact, motor grossly intact throughout PSYCH:  Cognitively intact, oriented to person place and time  EKG:  Sinus rhythm, rate 64, axis within normal limits, intervals within normal, no acute ST-T wave changes.  12/28/2012   ASSESSMENT AND PLAN  CAD:  The patient has no new sypmtoms.  No further cardiovascular testing is indicated.  We will continue with  aggressive risk reduction and meds as listed.  HTN:  The blood pressure is at target. No change in medications is indicated. We will continue with therapeutic lifestyle changes (TLC).  HYPERLIPIDEMIA:  His lipids were at target in April.  He will continue current therapy.  CAROTID STENOSIS:  He is up-to-date with both followup of carotid Dopplers and abdominal ultrasound.

## 2012-12-28 NOTE — Patient Instructions (Addendum)
The current medical regimen is effective;  continue present plan and medications.  Follow up in 1 year with Dr Hochrein.  You will receive a letter in the mail 2 months before you are due.  Please call us when you receive this letter to schedule your follow up appointment.  

## 2013-01-19 ENCOUNTER — Ambulatory Visit: Payer: Self-pay | Admitting: Family Medicine

## 2013-01-25 ENCOUNTER — Other Ambulatory Visit: Payer: Self-pay | Admitting: Family Medicine

## 2013-01-26 ENCOUNTER — Other Ambulatory Visit: Payer: Self-pay

## 2013-01-26 MED ORDER — HYDROCHLOROTHIAZIDE 25 MG PO TABS: ORAL_TABLET | ORAL | Status: DC

## 2013-02-02 ENCOUNTER — Other Ambulatory Visit: Payer: Medicare Other

## 2013-02-02 ENCOUNTER — Ambulatory Visit (INDEPENDENT_AMBULATORY_CARE_PROVIDER_SITE_OTHER): Payer: Medicare Other | Admitting: Family Medicine

## 2013-02-02 ENCOUNTER — Encounter: Payer: Self-pay | Admitting: Family Medicine

## 2013-02-02 VITALS — BP 145/90 | HR 77 | Temp 97.7°F | Ht 68.5 in | Wt 201.2 lb

## 2013-02-02 DIAGNOSIS — E559 Vitamin D deficiency, unspecified: Secondary | ICD-10-CM

## 2013-02-02 DIAGNOSIS — R5381 Other malaise: Secondary | ICD-10-CM

## 2013-02-02 DIAGNOSIS — Z79899 Other long term (current) drug therapy: Secondary | ICD-10-CM

## 2013-02-02 DIAGNOSIS — I10 Essential (primary) hypertension: Secondary | ICD-10-CM

## 2013-02-02 DIAGNOSIS — E785 Hyperlipidemia, unspecified: Secondary | ICD-10-CM

## 2013-02-02 DIAGNOSIS — E119 Type 2 diabetes mellitus without complications: Secondary | ICD-10-CM

## 2013-02-02 LAB — POCT CBC
Granulocyte percent: 67 %G (ref 37–80)
Lymph, poc: 1.6 (ref 0.6–3.4)
MCH, POC: 34.6 pg — AB (ref 27–31.2)
MCV: 95.6 fL (ref 80–97)
Platelet Count, POC: 137 10*3/uL — AB (ref 142–424)
RDW, POC: 12.5 %
WBC: 5.4 10*3/uL (ref 4.6–10.2)

## 2013-02-02 LAB — HEPATIC FUNCTION PANEL
ALT: 38 U/L (ref 0–53)
AST: 40 U/L — ABNORMAL HIGH (ref 0–37)
Albumin: 4.3 g/dL (ref 3.5–5.2)
Alkaline Phosphatase: 85 U/L (ref 39–117)

## 2013-02-02 LAB — BASIC METABOLIC PANEL WITH GFR
BUN: 16 mg/dL (ref 6–23)
Calcium: 9.5 mg/dL (ref 8.4–10.5)
Creat: 1.27 mg/dL (ref 0.50–1.35)
GFR, Est African American: 61 mL/min
GFR, Est Non African American: 53 mL/min — ABNORMAL LOW
Potassium: 4.2 mEq/L (ref 3.5–5.3)

## 2013-02-02 NOTE — Progress Notes (Signed)
Subjective:    Patient ID: [name], male    DOB: 1929/08/25, 79 y.o.   MRN: [id number]  HPI Patient returns to clinic today for followup of chronic medical problems. This includes hyperlipidemia type 2 diabetes mellitus controlled, hypertension and coronary artery disease. He is up-to-date on his health maintenance. He is on AndroGel for testosterone deficiency. Patient indicates that he does not think using AndroGel has helped him any he would like to stop using it. We will check testosterone levels today and a PSA level in the blood work that was drawn this morning. Patient does complain with more muscle aches myalgias especially in the shoulders and arms. He would like a refill on the Mobic that he has taken in the past and we reminded him that he has to be careful with taking this medication and potentially damaging his stomach, his kidneys or running up his blood pressure.   Review of Systems  Constitutional: Positive for fatigue.  HENT: Negative.   Eyes: Negative.   Respiratory: Negative.   Cardiovascular: Negative.   Gastrointestinal: Negative.   Endocrine: Negative.   Genitourinary: Positive for frequency (2 x times at night).  Musculoskeletal: Positive for myalgias (bilateral legs) and arthralgias (knees, hips, elbows).  Skin: Positive for rash (improving, lower legs).  Allergic/Immunologic: Negative.   Neurological: Negative.   Hematological: Negative.   Psychiatric/Behavioral: Negative.        Objective:   Physical Exam BP 145/90  Pulse 77  Temp(Src) 97.7 F (36.5 C) (Oral)  Ht 5' 8.5" (1.74 m)  Wt 201 lb 3.2 oz (91.264 kg)  BMI 30.14 kg/m2  The patient appeared well nourished and normally developed for his age, alert and oriented to time and place. Speech, behavior and judgement appear normal. Vital signs as documented.  Head exam is unremarkable. No scleral icterus or pallor noted. Ears nose and throat were within normal limits.  Neck is without jugular  venous distension, thyromegally, or carotid bruits. Carotid upstrokes are brisk bilaterally. No cervical adenopathy. Lungs are clear anteriorly and posteriorly to auscultation. Normal respiratory effort. Cardiac exam reveals regular rate and rhythm at 96 per minute. First and second heart sounds normal.  No murmurs, rubs or gallops.  Abdominal exam reveals normal bowl sounds, no masses, no organomegaly and no aortic enlargement. No inguinal adenopathy. There is no abdominal tenderness. There is an umbilical hernia. Extremities are nonedematous and both femoral and pedal pulses are normal. Skin without pallor or jaundice.  Warm and dry, without rash. Neurologic exam reveals normal deep tendon reflexes and normal sensation. Diabetic foot exam was done today. Rectal exam revealed a slightly enlarged prostate that was smooth without any lumps. There were no rectal masses. The external genitalia were normal. There were no hernias present .          Assessment & Plan:  1. Type II or unspecified type diabetes mellitus without mention of complication, not stated as uncontrolled - POCT glycosylated hemoglobin (Hb A1C) - BASIC METABOLIC PANEL WITH GFR - Testosterone, Total & Free Direct - PSA  2. Hypertension - BASIC METABOLIC PANEL WITH GFR - Testosterone, Total & Free Direct - PSA  3. Encounter for long-term (current) use of other medications - Hepatic function panel - Testosterone, Total & Free Direct - PSA  4. Fatigue - POCT CBC - Testosterone, Total & Free Direct - PSA  5. Hyperlipidemia - NMR Lipoprofile with Lipids - Testosterone, Total & Free Direct - PSA  6. Vitamin D deficiency - Vitamin D  25 hydroxy - Testosterone, Total & Free Direct - PSA  7. Testosterone deficiency -Patient will stop AndroGel because it is not helping him  8. Arthralgias and myalgias -No the 15 mg 1/2-1 daily as needed after eating, otherwise he will try Advil one twice daily after meals, he  understands that he is not to take both medications  Patient Instructions  Fall precautions discussed Continue current meds and therapeutic lifestyle changes

## 2013-02-02 NOTE — Patient Instructions (Addendum)
Fall precautions discussed Continue current meds and therapeutic lifestyle changes 

## 2013-02-03 LAB — VITAMIN D 25 HYDROXY (VIT D DEFICIENCY, FRACTURES): Vit D, 25-Hydroxy: 40 ng/mL (ref 30–89)

## 2013-02-06 LAB — NMR LIPOPROFILE WITH LIPIDS
HDL Particle Number: 24.9 umol/L — ABNORMAL LOW (ref >=30.5)
HDL Size: 8.5 nm — ABNORMAL LOW (ref >=9.2)
HDL-C: 33 mg/dL — ABNORMAL LOW (ref >=40)
Large HDL-P: <1.3 umol/L — ABNORMAL LOW (ref >=4.8)
Large VLDL-P: 2.3 nmol/L (ref <=2.7)
Triglycerides: 172 mg/dL — ABNORMAL HIGH (ref <150)

## 2013-02-07 LAB — TESTOSTERONE, TOTAL AND FREE DIRECT MEASURE
Free Testosterone, Direct: 2.1 pg/mL — ABNORMAL LOW (ref 3.8–34.2)
Testosterone: 328 ng/dL (ref 300–890)

## 2013-02-27 ENCOUNTER — Other Ambulatory Visit: Payer: Self-pay | Admitting: Family Medicine

## 2013-03-28 ENCOUNTER — Other Ambulatory Visit: Payer: Self-pay | Admitting: Family Medicine

## 2013-03-29 ENCOUNTER — Other Ambulatory Visit: Payer: Self-pay

## 2013-03-29 MED ORDER — METFORMIN HCL ER 500 MG PO TB24: 500.0000 mg | ORAL_TABLET | Freq: Two times a day (BID) | ORAL | Status: DC

## 2013-03-29 MED ORDER — ROSUVASTATIN CALCIUM 10 MG PO TABS: 10.0000 mg | ORAL_TABLET | Freq: Every day | ORAL | Status: DC

## 2013-04-28 ENCOUNTER — Other Ambulatory Visit: Payer: Self-pay | Admitting: Family Medicine

## 2013-05-30 ENCOUNTER — Other Ambulatory Visit: Payer: Self-pay | Admitting: Family Medicine

## 2013-06-30 ENCOUNTER — Other Ambulatory Visit: Payer: Self-pay | Admitting: Family Medicine

## 2013-07-31 ENCOUNTER — Other Ambulatory Visit: Payer: Self-pay | Admitting: Family Medicine

## 2013-08-01 ENCOUNTER — Encounter: Payer: Self-pay | Admitting: Family Medicine

## 2013-08-01 ENCOUNTER — Ambulatory Visit (INDEPENDENT_AMBULATORY_CARE_PROVIDER_SITE_OTHER): Payer: Medicare Other | Admitting: Family Medicine

## 2013-08-01 VITALS — BP 148/82 | HR 58 | Temp 97.2°F | Ht 68.5 in | Wt 200.0 lb

## 2013-08-01 DIAGNOSIS — E559 Vitamin D deficiency, unspecified: Secondary | ICD-10-CM | POA: Insufficient documentation

## 2013-08-01 DIAGNOSIS — M25561 Pain in right knee: Secondary | ICD-10-CM

## 2013-08-01 DIAGNOSIS — M25569 Pain in unspecified knee: Secondary | ICD-10-CM

## 2013-08-01 DIAGNOSIS — I10 Essential (primary) hypertension: Secondary | ICD-10-CM

## 2013-08-01 DIAGNOSIS — E785 Hyperlipidemia, unspecified: Secondary | ICD-10-CM

## 2013-08-01 DIAGNOSIS — E8881 Metabolic syndrome: Secondary | ICD-10-CM

## 2013-08-01 DIAGNOSIS — R21 Rash and other nonspecific skin eruption: Secondary | ICD-10-CM

## 2013-08-01 DIAGNOSIS — E119 Type 2 diabetes mellitus without complications: Secondary | ICD-10-CM | POA: Insufficient documentation

## 2013-08-01 DIAGNOSIS — J209 Acute bronchitis, unspecified: Secondary | ICD-10-CM

## 2013-08-01 LAB — POCT WET PREP WITH KOH
Clue Cells Wet Prep HPF POC: NEGATIVE
KOH Prep POC: POSITIVE
Trichomonas, UA: NEGATIVE

## 2013-08-01 MED ORDER — AZITHROMYCIN 250 MG PO TABS: ORAL_TABLET | ORAL | Status: DC

## 2013-08-01 MED ORDER — MECLIZINE HCL 32 MG PO TABS: 32.0000 mg | ORAL_TABLET | Freq: Four times a day (QID) | ORAL | Status: DC | PRN

## 2013-08-01 NOTE — Patient Instructions (Addendum)
Continue current medications. Continue good therapeutic lifestyle changes which include good diet and exercise. Fall precautions discussed with patient. Schedule your flu vaccine if you haven't had it yet If you are over 77 years old - you may need Prevnar 13 or the adult Pneumonia vaccine. We will try to get a visit with [facility] orthopedics when they come to this office To get a Prevnar, flu shot, and shingle shot. Wait 2 weeks come back and get the flu and Prevnar then. Weight weeks after that and return and give the shingle shot. We will call him with lab work once those results are available Take antibiotic as directed Take Mucinex, blue and white in color, over-the-counter, one twice daily with a large glass of water for cough and congestion Use inhalers regularly Drink plenty of fluid

## 2013-08-01 NOTE — Addendum Note (Signed)
Addended by: Prescott Gum on: 08/01/2013 08:58 AM   Modules accepted: Orders

## 2013-08-01 NOTE — Addendum Note (Signed)
Addended by: Magdalene River on: 08/01/2013 08:54 AM   Modules accepted: Orders, Medications

## 2013-08-01 NOTE — Progress Notes (Signed)
Subjective:    Patient ID: [name], male    DOB: 1930/07/22, 79 y.o.   MRN: [id number]  HPI Pt here for follow up and management of chronic medical problems. Patient is here today for routine management of his problems, but he also is complaining of cough congestion and a rash on his legs. He's also had some dizziness. His past medical history was reviewed. His health maintenance indicates that he needs lab work flu shot Prevnar vaccine and also in need of a shingle shot. Patient also complains of bilateral knee pain.       Patient Active Problem List   Diagnosis Date Noted  . PVD (peripheral vascular disease) 11/25/2011  . ILIAC ARTERY ANEURYSM 07/17/2010  . DYSLIPIDEMIA 08/28/2009  . CORONARY ATHEROSCLEROSIS NATIVE CORONARY ARTERY 08/28/2009  . UPPER RESPIRATORY INFECTION 08/28/2009  . HYPERTENSION 06/05/2009  . CEREBRAL ATHEROSCLEROSIS 06/04/2009   Outpatient Encounter Prescriptions as of 08/01/2013  Medication Sig  . ADVAIR DISKUS 100-50 MCG/DOSE AEPB INHALE ONE PUFF INTO LUNGS EVERY 12 HOURS AS NEEDED  . amLODipine-benazepril (LOTREL) 5-10 MG per capsule TAKE ONE CAPSULE BY MOUTH EVERY DAY  . aspirin 81 MG tablet Take 81 mg by mouth daily.    . fish oil-omega-3 fatty acids 1000 MG capsule Take 2 g by mouth daily.    . hydrochlorothiazide (HYDRODIURIL) 25 MG tablet TAKE ONE-HALF TABLET BY MOUTH ONCE DAILY EXCEPT  MONDAY,  WEDNESDAY  AND  FRIDAY  TAKE  A  WHOLE  TABLET  . ibuprofen (ADVIL,MOTRIN) 200 MG tablet Take 200 mg by mouth daily.  . meloxicam (MOBIC) 15 MG tablet Take 15 mg by mouth daily.  . metFORMIN (GLUCOPHAGE-XR) 500 MG 24 hr tablet Take 1 tablet (500 mg total) by mouth 2 (two) times daily.  . metoprolol (LOPRESSOR) 100 MG tablet TAKE ONE TABLET BY MOUTH TWICE DAILY  . niacin (NIASPAN) 1000 MG CR tablet TAKE ONE TABLET BY MOUTH AT BEDTIME  . NITROSTAT 0.4 MG SL tablet USE AS DIRECTED  . PROAIR HFA 108 (90 BASE) MCG/ACT inhaler INHALE 2 PUFFS AS DIRECTED  FOUR TIMES A DAY AS NEEDED  . [DISCONTINUED] Albuterol Sulfate (PROAIR HFA IN) Inhale into the lungs as directed.    . [DISCONTINUED] CRESTOR 10 MG tablet TAKE ONE TABLET BY MOUTH ONCE DAILY  . rosuvastatin (CRESTOR) 10 MG tablet Take 1 tablet (10 mg total) by mouth daily.  . [DISCONTINUED] ANDROGEL PUMP 20.25 MG/ACT (1.62%) GEL   . [DISCONTINUED] cholecalciferol (VITAMIN D) 1000 UNITS tablet Take 1,000 Units by mouth daily.    Review of Systems  Constitutional: Negative.   HENT: Positive for congestion.   Eyes: Negative.   Respiratory: Positive for cough.   Cardiovascular: Negative.   Gastrointestinal: Negative.   Endocrine: Negative.   Genitourinary: Negative.   Musculoskeletal: Positive for arthralgias (knee pain).  Skin: Positive for rash (bilateral legs).  Allergic/Immunologic: Negative.   Neurological: Positive for dizziness.  Hematological: Negative.   Psychiatric/Behavioral: Negative.        Objective:   Physical Exam  Nursing note and vitals reviewed. Constitutional: He is oriented to person, place, and time. He appears well-developed and well-nourished. No distress.  HENT:  Head: Normocephalic and atraumatic.  Left Ear: External ear normal.  Mouth/Throat: Oropharynx is clear and moist. No oropharyngeal exudate.  Ears cerumen right EAC, nasal congestion bilateral  Eyes: Conjunctivae and EOM are normal. Pupils are equal, round, and reactive to light. Right eye exhibits no discharge. Left eye exhibits no discharge. No scleral  icterus.  Neck: Normal range of motion. Neck supple. No thyromegaly present.  Cardiovascular: Normal rate, regular rhythm, normal heart sounds and intact distal pulses.  Exam reveals no gallop and no friction rub.   No murmur heard. 72 per minute  Pulmonary/Chest: Effort normal and breath sounds normal. No respiratory distress. He has no wheezes. He has no rales. He exhibits no tenderness.  Dry cough, no axillary nodes  Abdominal: Soft. Bowel  sounds are normal. He exhibits no mass. There is no tenderness. There is no rebound and no guarding.  Umbilical hernia present with out tenderness  Musculoskeletal: He exhibits no edema and no tenderness.  Range of motion slightly limited due to 2 arthritic pain and deformity in his right knee  Lymphadenopathy:    He has no cervical adenopathy.  Neurological: He is alert and oriented to person, place, and time. He has normal reflexes. No cranial nerve deficit.  Skin: Skin is warm and dry. No rash noted. No erythema. No pallor.  Patches of circular skin rash on the lower legs right greater than the left  Psychiatric: He has a normal mood and affect. His behavior is normal. Judgment and thought content normal.   BP 148/82  Pulse 58  Temp(Src) 97.2 F (36.2 C) (Oral)  Ht 5' 8.5" (1.74 m)  Wt 200 lb (90.719 kg)  BMI 29.96 kg/m2  Wet prep or KOH prep not  available at the time of patient visit       Assessment & Plan:  1. HYPERTENSION - POCT CBC - BMP8+EGFR - Hepatic function panel  2. DYSLIPIDEMIA - POCT CBC - NMR, lipoprofile  3. Diabetes - POCT CBC - POCT glycosylated hemoglobin (Hb A1C) - POCT UA - Microalbumin  4. Vitamin D deficiency - Vit D  25 hydroxy (rtn osteoporosis monitoring)  5. Metabolic syndrome  6. Rash, skin - POCT Wet Prep with KOH  7. Acute bronchitis - azithromycin (ZITHROMAX) 250 MG tablet; 2 tablets for the first day then 1 daily for 4 days  Dispense: 6 tablet; Refill: 0  8. Bilateral knee pain  Patient Instructions  Continue current medications. Continue good therapeutic lifestyle changes which include good diet and exercise. Fall precautions discussed with patient. Schedule your flu vaccine if you haven't had it yet If you are over 86 years old - you may need Prevnar 13 or the adult Pneumonia vaccine. We will try to get a visit with [facility] orthopedics when they come to this office To get a Prevnar, flu shot, and shingle shot. Wait 2  weeks come back and get the flu and Prevnar then. Weight weeks after that and return and give the shingle shot. We will call him with lab work once those results are available Take antibiotic as directed Take Mucinex, blue and white in color, over-the-counter, one twice daily with a large glass of water for cough and congestion Use inhalers regularly Drink plenty of fluid   Use a coolmist humidification at home  Nyra Capes MD

## 2013-08-01 NOTE — Addendum Note (Signed)
Addended by: Prescott Gum on: 08/01/2013 08:57 AM   Modules accepted: Orders

## 2013-08-02 LAB — CBC WITH DIFFERENTIAL
Basophils Absolute: 0 10*3/uL (ref 0.0–0.2)
Eos: 5 %
Eosinophils Absolute: 0.4 10*3/uL (ref 0.0–0.4)
HCT: 44.3 % (ref 37.5–51.0)
Immature Granulocytes: 0 %
Lymphocytes Absolute: 2.1 10*3/uL (ref 0.7–3.1)
MCH: 33 pg (ref 26.6–33.0)
MCHC: 34.8 g/dL (ref 31.5–35.7)
MCV: 95 fL (ref 79–97)
Monocytes Absolute: 0.6 10*3/uL (ref 0.1–0.9)
Neutrophils Relative %: 54 %
Platelets: 189 10*3/uL (ref 150–379)
RBC: 4.67 x10E6/uL (ref 4.14–5.80)
RDW: 12.9 % (ref 12.3–15.4)

## 2013-08-02 LAB — HEPATIC FUNCTION PANEL
ALT: 16 IU/L (ref 0–44)
AST: 14 IU/L (ref 0–40)
Albumin: 4.5 g/dL (ref 3.5–4.7)
Alkaline Phosphatase: 98 IU/L (ref 39–117)
Bilirubin, Direct: 0.17 mg/dL (ref 0.00–0.40)
Total Bilirubin: 0.7 mg/dL (ref 0.0–1.2)
Total Protein: 7 g/dL (ref 6.0–8.5)

## 2013-08-02 LAB — NMR, LIPOPROFILE
HDL Particle Number: 29.7 umol/L — ABNORMAL LOW (ref >=30.5)
LDL Size: 20.6 nm (ref >20.5)
Small LDL Particle Number: 752 nmol/L — ABNORMAL HIGH (ref <=527)
Triglycerides by NMR: 154 mg/dL — ABNORMAL HIGH (ref <150)

## 2013-08-02 LAB — BMP8+EGFR
BUN/Creatinine Ratio: 14 (ref 10–22)
Calcium: 9.5 mg/dL (ref 8.6–10.2)
Creatinine, Ser: 1.03 mg/dL (ref 0.76–1.27)
GFR calc Af Amer: 78 mL/min/{1.73_m2} (ref >59)
GFR calc non Af Amer: 68 mL/min/{1.73_m2} (ref >59)
Sodium: 137 mmol/L (ref 134–144)

## 2013-08-02 LAB — VITAMIN D 25 HYDROXY (VIT D DEFICIENCY, FRACTURES): Vit D, 25-Hydroxy: 28.3 ng/mL — ABNORMAL LOW (ref 30.0–100.0)

## 2013-08-11 ENCOUNTER — Telehealth: Payer: Self-pay | Admitting: Family Medicine

## 2013-08-15 ENCOUNTER — Ambulatory Visit (INDEPENDENT_AMBULATORY_CARE_PROVIDER_SITE_OTHER): Payer: Medicare Other

## 2013-08-15 ENCOUNTER — Telehealth: Payer: Self-pay | Admitting: *Deleted

## 2013-08-15 DIAGNOSIS — Z23 Encounter for immunization: Secondary | ICD-10-CM

## 2013-08-15 NOTE — Telephone Encounter (Signed)
Please followup on this skin scraping results from the lab and let me know what the results were and we will call the patient

## 2013-08-15 NOTE — Telephone Encounter (Signed)
Gave patient results of recent labwork. Was asking about a scraping that was done and took to the lab. Do not see results in computer. Please advise if test was done.

## 2013-08-16 NOTE — Telephone Encounter (Signed)
Pt aware per lab note

## 2013-08-28 ENCOUNTER — Other Ambulatory Visit: Payer: Self-pay | Admitting: Family Medicine

## 2013-08-30 ENCOUNTER — Encounter: Payer: Medicare Other | Admitting: *Deleted

## 2013-08-30 NOTE — Progress Notes (Signed)
This encounter was created in error - please disregard.

## 2013-09-11 DIAGNOSIS — J349 Unspecified disorder of nose and nasal sinuses: Secondary | ICD-10-CM | POA: Insufficient documentation

## 2013-09-11 DIAGNOSIS — R918 Other nonspecific abnormal finding of lung field: Secondary | ICD-10-CM | POA: Insufficient documentation

## 2013-09-12 ENCOUNTER — Ambulatory Visit: Payer: Medicare Other

## 2013-09-12 ENCOUNTER — Encounter: Payer: Self-pay | Admitting: Family Medicine

## 2013-09-12 ENCOUNTER — Ambulatory Visit (INDEPENDENT_AMBULATORY_CARE_PROVIDER_SITE_OTHER): Payer: Medicare Other | Admitting: Family Medicine

## 2013-09-12 VITALS — BP 181/99 | HR 87 | Temp 97.1°F | Ht 68.5 in | Wt 202.0 lb

## 2013-09-12 DIAGNOSIS — J343 Hypertrophy of nasal turbinates: Secondary | ICD-10-CM | POA: Insufficient documentation

## 2013-09-12 DIAGNOSIS — E8881 Metabolic syndrome: Secondary | ICD-10-CM

## 2013-09-12 DIAGNOSIS — J329 Chronic sinusitis, unspecified: Secondary | ICD-10-CM

## 2013-09-12 DIAGNOSIS — I10 Essential (primary) hypertension: Secondary | ICD-10-CM

## 2013-09-12 DIAGNOSIS — Z8673 Personal history of transient ischemic attack (TIA), and cerebral infarction without residual deficits: Secondary | ICD-10-CM

## 2013-09-12 DIAGNOSIS — G459 Transient cerebral ischemic attack, unspecified: Secondary | ICD-10-CM | POA: Insufficient documentation

## 2013-09-12 NOTE — Progress Notes (Signed)
Subjective:    Patient ID: [name], male    DOB: 1929/11/06, 80 y.o.   MRN: [id number]  HPI Patient here today to discuss up-coming surgery with Dr Laurance Flatten. History is significant in that the patient had a T. I A. on January 24 and was hospitalized for 24 hours at [facility]. He had a CT scan of the head and neck and apparently this showed a severe sinus infection on the right side.  He was subsequently started on Aggrenox and it was felt urgent that he have  sinus surgery. This is scheduled for March. He indicates he has no further followups with the neurologist. His blood pressure was elevated initially today and a repeat blood pressure was 160/90. He indicates his blood pressures at home are good running from 120-130/60-70. The January 24 admission was diagnosed as a TIA per patient. He also has a history of arthritis in his right knee and he has a scheduled appointment with the orthopedist at this office this month..       Patient Active Problem List   Diagnosis Date Noted  . Metabolic syndrome 05/30/2535  . Vitamin D deficiency 08/01/2013  . Diabetes 08/01/2013  . PVD (peripheral vascular disease) 11/25/2011  . ILIAC ARTERY ANEURYSM 07/17/2010  . DYSLIPIDEMIA 08/28/2009  . CORONARY ATHEROSCLEROSIS NATIVE CORONARY ARTERY 08/28/2009  . UPPER RESPIRATORY INFECTION 08/28/2009  . HYPERTENSION 06/05/2009  . CEREBRAL ATHEROSCLEROSIS 06/04/2009   Outpatient Encounter Prescriptions as of 09/12/2013  Medication Sig  . ADVAIR DISKUS 100-50 MCG/DOSE AEPB INHALE ONE DOSE BY MOUTH EVERY 12 HOURS AS NEEDED  . amLODipine-benazepril (LOTREL) 5-10 MG per capsule TAKE ONE CAPSULE BY MOUTH ONCE DAILY  . dipyridamole-aspirin (AGGRENOX) 200-25 MG per 12 hr capsule Take 1 capsule by mouth 2 (two) times daily.  . fish oil-omega-3 fatty acids 1000 MG capsule Take 2 g by mouth daily.    . hydrochlorothiazide (HYDRODIURIL) 25 MG tablet TAKE ONE-HALF TABLET BY MOUTH ONCE DAILY EXCEPT  MONDAY,   WEDNESDAY  AND  FRIDAY  TAKE  A  WHOLE  TABLET  . ibuprofen (ADVIL,MOTRIN) 200 MG tablet Take 200 mg by mouth daily.  . meloxicam (MOBIC) 15 MG tablet Take 15 mg by mouth daily.  . metFORMIN (GLUCOPHAGE-XR) 500 MG 24 hr tablet TAKE ONE TABLET BY MOUTH TWICE DAILY  . metoprolol (LOPRESSOR) 100 MG tablet TAKE ONE TABLET BY MOUTH TWICE DAILY  . niacin (NIASPAN) 1000 MG CR tablet TAKE ONE TABLET BY MOUTH AT BEDTIME  . NITROSTAT 0.4 MG SL tablet USE AS DIRECTED  . PROAIR HFA 108 (90 BASE) MCG/ACT inhaler INHALE 2 PUFFS AS DIRECTED FOUR TIMES A DAY AS NEEDED  . rosuvastatin (CRESTOR) 10 MG tablet Take 1 tablet (10 mg total) by mouth daily.  . meclizine (ANTIVERT) 32 MG tablet Take 1 tablet (32 mg total) by mouth 4 (four) times daily as needed.  . [DISCONTINUED] aspirin 81 MG tablet Take 81 mg by mouth daily.    . [DISCONTINUED] azithromycin (ZITHROMAX) 250 MG tablet As directed    Review of Systems  Constitutional: Negative.   HENT: Negative.   Eyes: Negative.   Respiratory: Negative.   Cardiovascular: Negative.   Gastrointestinal: Negative.   Endocrine: Negative.   Genitourinary: Negative.   Musculoskeletal: Negative.   Skin: Negative.   Allergic/Immunologic: Negative.   Neurological: Negative.   Hematological: Negative.   Psychiatric/Behavioral: Negative.        Objective:   Physical Exam  Nursing note and vitals reviewed. Constitutional:  He is oriented to person, place, and time. He appears well-developed and well-nourished. No distress.  HENT:  Head: Normocephalic and atraumatic.  Right Ear: External ear normal.  Left Ear: External ear normal.  Nose: Nose normal.  Mouth/Throat: Oropharynx is clear and moist. No oropharyngeal exudate.  No sinus tenderness and no drainage apparent  Eyes: Conjunctivae and EOM are normal. Pupils are equal, round, and reactive to light. Right eye exhibits no discharge. Left eye exhibits no discharge. No scleral icterus.  Neck: Normal range of  motion. Neck supple. No thyromegaly present.  There is a right supraclavicular bruit. There are no carotid bruits.  Cardiovascular: Normal rate, regular rhythm, normal heart sounds and intact distal pulses.  Exam reveals no gallop and no friction rub.   No murmur heard. At 72 per minute  Pulmonary/Chest: Effort normal and breath sounds normal. No respiratory distress. He has no wheezes. He has no rales. He exhibits no tenderness.  Abdominal: Soft. Bowel sounds are normal.  Musculoskeletal: Normal range of motion.  Lymphadenopathy:    He has no cervical adenopathy.  Neurological: He is alert and oriented to person, place, and time. He has normal reflexes. No cranial nerve deficit.  Skin: Skin is warm and dry. No rash noted.  Psychiatric: He has a normal mood and affect. His behavior is normal. Judgment and thought content normal.   BP 181/99  Pulse 87  Temp(Src) 97.1 F (36.2 C) (Oral)  Ht 5' 8.5" (1.74 m)  Wt 202 lb (91.627 kg)  BMI 30.26 kg/m2        Assessment & Plan:  1. Sinusitis  2. History of TIA (transient ischemic attack)  3. Essential hypertension, benign  4. Metabolic syndrome Patient Instructions  You should continue to take the baby aspirin with the Aggrenox. This is directly taken from the note on discharge from [facility]. Keep your appointment with the orthopedic surgeon regarding your right knee Before any knee replacement is done you will need to see the cardiologist. Be sure her and remind the ear nose and throat specialist that her doing her sinus surgery about your TIA and the medication that you have been started on for this.    Arrie Senate MD

## 2013-09-12 NOTE — Patient Instructions (Signed)
You should continue to take the baby aspirin with the Aggrenox. This is directly taken from the note on discharge from [facility]. Keep your appointment with the orthopedic surgeon regarding your right knee Before any knee replacement is done you will need to see the cardiologist. Be sure her and remind the ear nose and throat specialist that her doing her sinus surgery about your TIA and the medication that you have been started on for this.

## 2013-09-25 ENCOUNTER — Telehealth: Payer: Self-pay | Admitting: *Deleted

## 2013-09-25 NOTE — Telephone Encounter (Signed)
Patient is having a dental procedure done soon. He takes aggrenox and Dr Bobby Rumpf wants a faxed note sent to him at fax # [phone] stating directions on how patient should stop med and if he is clear for procedure.  Aggrenox 25mg -200mg  1 pill BID

## 2013-09-25 NOTE — Telephone Encounter (Signed)
Pt understands and note to be sent to Dr Bobby Rumpf.

## 2013-09-25 NOTE — Telephone Encounter (Signed)
Although routine holding Aggrenox prior to most dental procedures is not recommmended since Dr Bobby Rumpf is asking to hold.... Recommend hold Aggrenox starting 5 days prior to dental procedure.  Restart Aggrenox the day following procedure.  Note given to Dr Laurance Flatten to sign and his nurse to fax to Dr Bobby Rumpf.  Tried to call patient no answer - will have nurse continue to try to contact him.

## 2013-09-25 NOTE — Telephone Encounter (Signed)
I spoke with Dr. Bobby Rumpf, and understanding his desire to not take this medication, but also understanding the patient's history of TIAs, I'm going to recommend that we just hold the medication the day of the dental work and then start it back the next day. Please send a note to Dr. Bobby Rumpf stating this.

## 2013-09-29 ENCOUNTER — Ambulatory Visit (HOSPITAL_BASED_OUTPATIENT_CLINIC_OR_DEPARTMENT_OTHER): Payer: Medicare Other

## 2013-09-29 ENCOUNTER — Ambulatory Visit (HOSPITAL_COMMUNITY): Payer: Medicare Other | Attending: Cardiovascular Disease

## 2013-09-29 ENCOUNTER — Encounter: Payer: Self-pay | Admitting: Cardiovascular Disease

## 2013-09-29 DIAGNOSIS — I739 Peripheral vascular disease, unspecified: Secondary | ICD-10-CM | POA: Insufficient documentation

## 2013-09-29 DIAGNOSIS — I714 Abdominal aortic aneurysm, without rupture, unspecified: Secondary | ICD-10-CM

## 2013-09-29 DIAGNOSIS — I6529 Occlusion and stenosis of unspecified carotid artery: Secondary | ICD-10-CM

## 2013-10-02 ENCOUNTER — Encounter (HOSPITAL_COMMUNITY): Payer: Medicare Other

## 2013-10-31 ENCOUNTER — Other Ambulatory Visit: Payer: Self-pay | Admitting: Family Medicine

## 2013-12-04 ENCOUNTER — Other Ambulatory Visit: Payer: Self-pay | Admitting: Family Medicine

## 2013-12-06 ENCOUNTER — Ambulatory Visit: Payer: Medicare Other | Admitting: Family Medicine

## 2013-12-20 ENCOUNTER — Other Ambulatory Visit: Payer: Self-pay | Admitting: Family Medicine

## 2013-12-22 ENCOUNTER — Ambulatory Visit (INDEPENDENT_AMBULATORY_CARE_PROVIDER_SITE_OTHER): Payer: Medicare Other | Admitting: Family Medicine

## 2013-12-22 ENCOUNTER — Encounter: Payer: Self-pay | Admitting: Family Medicine

## 2013-12-22 VITALS — BP 126/75 | HR 64 | Temp 97.8°F | Ht 68.5 in | Wt 198.0 lb

## 2013-12-22 DIAGNOSIS — E119 Type 2 diabetes mellitus without complications: Secondary | ICD-10-CM

## 2013-12-22 DIAGNOSIS — M2550 Pain in unspecified joint: Secondary | ICD-10-CM

## 2013-12-22 DIAGNOSIS — H6120 Impacted cerumen, unspecified ear: Secondary | ICD-10-CM

## 2013-12-22 DIAGNOSIS — Z8673 Personal history of transient ischemic attack (TIA), and cerebral infarction without residual deficits: Secondary | ICD-10-CM

## 2013-12-22 DIAGNOSIS — E785 Hyperlipidemia, unspecified: Secondary | ICD-10-CM

## 2013-12-22 DIAGNOSIS — H6121 Impacted cerumen, right ear: Secondary | ICD-10-CM

## 2013-12-22 DIAGNOSIS — R351 Nocturia: Secondary | ICD-10-CM

## 2013-12-22 DIAGNOSIS — I10 Essential (primary) hypertension: Secondary | ICD-10-CM

## 2013-12-22 DIAGNOSIS — E559 Vitamin D deficiency, unspecified: Secondary | ICD-10-CM

## 2013-12-22 LAB — POCT URINALYSIS DIPSTICK
Bilirubin, UA: NEGATIVE
Blood, UA: NEGATIVE
Glucose, UA: NEGATIVE
Ketones, UA: NEGATIVE
LEUKOCYTES UA: NEGATIVE
Nitrite, UA: NEGATIVE
PH UA: 6.5
Protein, UA: NEGATIVE
Spec Grav, UA: 1.01
Urobilinogen, UA: NEGATIVE

## 2013-12-22 LAB — POCT CBC
Granulocyte percent: 67.7 %G (ref 37–80)
HCT, POC: 46 % (ref 43.5–53.7)
Hemoglobin: 15.2 g/dL (ref 14.1–18.1)
Lymph, poc: 2.1 (ref 0.6–3.4)
MCH: 33 pg — AB (ref 27–31.2)
MCHC: 33.1 g/dL (ref 31.8–35.4)
MCV: 99.4 fL — AB (ref 80–97)
MPV: 7.8 fL (ref 0–99.8)
POC Granulocyte: 4.8 (ref 2–6.9)
POC LYMPH PERCENT: 29.5 %L (ref 10–50)
Platelet Count, POC: 182 10*3/uL (ref 142–424)
RBC: 4.6 M/uL — AB (ref 4.69–6.13)
RDW, POC: 12.3 %
WBC: 7.1 10*3/uL (ref 4.6–10.2)

## 2013-12-22 LAB — POCT GLYCOSYLATED HEMOGLOBIN (HGB A1C): Hemoglobin A1C: 6.4

## 2013-12-22 LAB — POCT UA - MICROSCOPIC ONLY
Bacteria, U Microscopic: NEGATIVE
Casts, Ur, LPF, POC: NEGATIVE
Crystals, Ur, HPF, POC: NEGATIVE
Mucus, UA: NEGATIVE
RBC, urine, microscopic: NEGATIVE
WBC, Ur, HPF, POC: NEGATIVE
YEAST UA: NEGATIVE

## 2013-12-22 MED ORDER — MELOXICAM 15 MG PO TABS: 15.0000 mg | ORAL_TABLET | Freq: Every day | ORAL | Status: DC

## 2013-12-22 NOTE — Progress Notes (Signed)
Subjective:    Patient ID: [name], male    DOB: 1930/02/09, 80 y.o.   MRN: [id number]  HPI Patient comes in today for his 6 month follow up on chronic medical conditions. He also needs a refill on his Meloxicam. He hasn't taken it in months but would like to restart it. The patient has a persistent rash on his right knee and he has an appointment to see the dermatologist .  He is due for lab work today and an FOBT. He also complains of hearing problems from the right ear. He is due to see the cardiologist in June. He is still contemplating on having knee replacement by the orthopedist. He will discuss with his cardiologist to make sure there is no contraindication to knee replacement. He does have nocturia.   Review of Systems  Constitutional: Negative.   HENT: Negative.   Eyes: Negative.   Respiratory: Negative.   Cardiovascular: Negative.   Gastrointestinal: Negative.   Endocrine: Negative.   Genitourinary:       Nocturia  Musculoskeletal: Positive for arthralgias (chronic).  Skin: Positive for rash (Rash on right knee. Using Lamisil. Seeing dermatologist in the next few days. ).  Allergic/Immunologic: Negative.   Neurological: Negative.   Hematological: Negative.   Psychiatric/Behavioral: Negative.        Objective:   Physical Exam  Nursing note and vitals reviewed. Constitutional: He is oriented to person, place, and time. He appears well-developed and well-nourished. No distress.  The patient was pleasant and cooperative and alert. He is somewhat kyphotic in appearance.  HENT:  Head: Normocephalic and atraumatic.  Left Ear: External ear normal.  Nose: Nose normal.  Mouth/Throat: Oropharynx is clear and moist. No oropharyngeal exudate.  The right ear canal was impacted with cerumen and this was partially removed with an ear curette and further removal will be necessary  Eyes: Conjunctivae and EOM are normal. Pupils are equal, round, and reactive to light. Right eye  exhibits no discharge. Left eye exhibits no discharge. No scleral icterus.  Neck: Normal range of motion. Neck supple. No thyromegaly present.  Cardiovascular: Normal rate, regular rhythm and normal heart sounds.  Exam reveals no gallop and no friction rub.   No murmur heard. At 84 per minute, pedal pulses in both feet were difficult to palpate. The inguinal pulses were palpable bilaterally.  Pulmonary/Chest: Effort normal and breath sounds normal. No respiratory distress. He has no wheezes. He has no rales. He exhibits no tenderness.  Breath sounds were good.  Abdominal: Soft. Bowel sounds are normal. He exhibits no mass. There is no tenderness. There is no rebound and no guarding.  Musculoskeletal: Normal range of motion. He exhibits no edema and no tenderness.  Lymphadenopathy:    He has no cervical adenopathy.  Neurological: He is alert and oriented to person, place, and time. He has normal reflexes. No cranial nerve deficit.  Skin: Skin is warm and dry. Rash (there is a discoloration of the right medial knee which appears to be a rash that is healing. He has been using Lamisil cream on this.) noted.  Psychiatric: He has a normal mood and affect. His behavior is normal. Judgment and thought content normal.   BP 126/75  Pulse 64  Temp(Src) 97.8 F (36.6 C) (Oral)  Ht 5' 8.5" (1.74 m)  Wt 198 lb (89.812 kg)  BMI 29.66 kg/m2       Assessment & Plan:  1. History of TIA (transient ischemic attack)  2. Diabetes -  BMP8+EGFR - POCT glycosylated hemoglobin (Hb A1C)  3. Vitamin D deficiency - Vit D  25 hydroxy (rtn osteoporosis monitoring)  4. Arthralgia  5. Hypertension - POCT CBC  6. Hyperlipidemia - NMR, lipoprofile - Hepatic function panel  7. Nocturia - POCT UA - Microscopic Only - POCT urinalysis dipstick  8. Right ear impacted cerumen  Patient Instructions                        Continue current medications. Continue good therapeutic lifestyle changes which  include good diet and exercise. Fall precautions discussed with patient. If an FOBT was given today- please return it to our front desk. If you are over 2 years old - you may need Prevnar 24 or the adult Pneumonia vaccine. Try debrox ear drops 2-3 drops nightly to the affected ear for 3 nights wait one week and repeat-this will help soften the ear cerumen so we'll be more easy to remove Tylenol is safe for band ibuprofen or meloxicam. Always try this before you take an NSAID Sure and discuss with the cardiologist the possibility of you having the right knee replaced    Medicare Annual Wellness Visit  [place] and the medical providers at [facility] strive to bring you the best medical care.  In doing so we not only want to address your current medical conditions and concerns but also to detect new conditions early and prevent illness, disease and health-related problems.    Medicare offers a yearly Wellness Visit which allows our clinical staff to assess your need for preventative services including immunizations, lifestyle education, counseling to decrease risk of preventable diseases and screening for fall risk and other medical concerns.    This visit is provided free of charge (no copay) for all Medicare recipients. The clinical pharmacists at [facility] have begun to conduct these Wellness Visits which will also include a thorough review of all your medications.    As you primary medical provider recommend that you make an appointment for your Annual Wellness Visit if you have not done so already this year.  You may set up this appointment before you leave today or you may call back [phone]) and schedule an appointment.  Please make sure when you call that you mention that you are scheduling your Annual Wellness Visit with the clinical pharmacist so that the appointment may be made for the proper length of time.      Arrie Senate MD

## 2013-12-22 NOTE — Patient Instructions (Addendum)
    Continue current medications. Continue good therapeutic lifestyle changes which include good diet and exercise. Fall precautions discussed with patient. If an FOBT was given today- please return it to our front desk. If you are over 78 years old - you may need Prevnar 32 or the adult Pneumonia vaccine. Try debrox ear drops 2-3 drops nightly to the affected ear for 3 nights wait one week and repeat-this will help soften the ear cerumen so we'll be more easy to remove Tylenol is safe for band ibuprofen or meloxicam. Always try this before you take an NSAID Sure and discuss with the cardiologist the possibility of you having the right knee replaced    Medicare Annual Wellness Visit  [place] and the medical providers at [facility] strive to bring you the best medical care.  In doing so we not only want to address your current medical conditions and concerns but also to detect new conditions early and prevent illness, disease and health-related problems.    Medicare offers a yearly Wellness Visit which allows our clinical staff to assess your need for preventative services including immunizations, lifestyle education, counseling to decrease risk of preventable diseases and screening for fall risk and other medical concerns.    This visit is provided free of charge (no copay) for all Medicare recipients. The clinical pharmacists at [facility] have begun to conduct these Wellness Visits which will also include a thorough review of all your medications.    As you primary medical provider recommend that you make an appointment for your Annual Wellness Visit if you have not done so already this year.  You may set up this appointment before you leave today or you may call back [phone]) and schedule an appointment.  Please make sure when you call that you mention that you are scheduling your Annual Wellness Visit with the clinical  pharmacist so that the appointment may be made for the proper length of time.

## 2013-12-23 LAB — HEPATIC FUNCTION PANEL
ALK PHOS: 80 IU/L (ref 39–117)
ALT: 19 IU/L (ref 0–44)
AST: 20 IU/L (ref 0–40)
Albumin: 5 g/dL — ABNORMAL HIGH (ref 3.5–4.7)
Bilirubin, Direct: 0.15 mg/dL (ref 0.00–0.40)
Total Bilirubin: 0.7 mg/dL (ref 0.0–1.2)
Total Protein: 7.4 g/dL (ref 6.0–8.5)

## 2013-12-23 LAB — BMP8+EGFR
BUN/Creatinine Ratio: 12 (ref 10–22)
BUN: 14 mg/dL (ref 8–27)
CALCIUM: 9.7 mg/dL (ref 8.6–10.2)
CO2: 28 mmol/L (ref 18–29)
Chloride: 96 mmol/L — ABNORMAL LOW (ref 97–108)
Creatinine, Ser: 1.16 mg/dL (ref 0.76–1.27)
GFR calc Af Amer: 68 mL/min/{1.73_m2} (ref >59)
GFR calc non Af Amer: 59 mL/min/{1.73_m2} — ABNORMAL LOW (ref >59)
Glucose: 148 mg/dL — ABNORMAL HIGH (ref 65–99)
Potassium: 4.2 mmol/L (ref 3.5–5.2)
Sodium: 139 mmol/L (ref 134–144)

## 2013-12-23 LAB — VITAMIN D 25 HYDROXY (VIT D DEFICIENCY, FRACTURES): VIT D 25 HYDROXY: 32 ng/mL (ref 30.0–100.0)

## 2013-12-23 LAB — NMR, LIPOPROFILE
CHOLESTEROL: 112 mg/dL (ref 100–199)
HDL CHOLESTEROL BY NMR: 41 mg/dL (ref >39)
HDL PARTICLE NUMBER: 35.1 umol/L (ref >=30.5)
LDL Particle Number: 633 nmol/L (ref <1000)
LDL Size: 20.3 nm (ref >20.5)
LDLC SERPL CALC-MCNC: 43 mg/dL (ref 0–99)
LP-IR Score: 61 — ABNORMAL HIGH (ref <=45)
Small LDL Particle Number: 331 nmol/L (ref <=527)
Triglycerides by NMR: 141 mg/dL (ref 0–149)

## 2013-12-27 ENCOUNTER — Other Ambulatory Visit: Payer: Medicare Other

## 2013-12-27 DIAGNOSIS — Z1212 Encounter for screening for malignant neoplasm of rectum: Secondary | ICD-10-CM

## 2013-12-29 LAB — FECAL OCCULT BLOOD, IMMUNOCHEMICAL: FECAL OCCULT BLD: NEGATIVE

## 2014-01-05 ENCOUNTER — Telehealth: Payer: Self-pay | Admitting: Cardiology

## 2014-01-08 ENCOUNTER — Other Ambulatory Visit: Payer: Self-pay | Admitting: Family Medicine

## 2014-01-10 ENCOUNTER — Ambulatory Visit: Payer: Medicare Other | Admitting: Cardiology

## 2014-01-26 ENCOUNTER — Ambulatory Visit (INDEPENDENT_AMBULATORY_CARE_PROVIDER_SITE_OTHER): Payer: Medicare Other | Admitting: Cardiology

## 2014-01-26 ENCOUNTER — Encounter: Payer: Self-pay | Admitting: Cardiology

## 2014-01-26 VITALS — BP 151/83 | HR 76 | Ht 71.5 in | Wt 198.0 lb

## 2014-01-26 DIAGNOSIS — I10 Essential (primary) hypertension: Secondary | ICD-10-CM

## 2014-01-26 DIAGNOSIS — I2510 Atherosclerotic heart disease of native coronary artery without angina pectoris: Secondary | ICD-10-CM

## 2014-01-26 DIAGNOSIS — I739 Peripheral vascular disease, unspecified: Secondary | ICD-10-CM

## 2014-01-26 DIAGNOSIS — I672 Cerebral atherosclerosis: Secondary | ICD-10-CM

## 2014-01-26 NOTE — Progress Notes (Signed)
HPI The patient presents for followup of his known coronary disease. Since I last saw him he he was at [facility] with an apparent TIA. I having trouble finding these records. He said HTN with Aggrenox but there was no clear etiology. He has since had no further symptoms. He works his farm. With this he has no symptoms.  The patient denies any new symptoms such as chest discomfort, neck or arm discomfort. There has been no new shortness of breath, PND or orthopnea. There have been no reported palpitations, presyncope or syncope. He has had no weight gain or edema.  He does have some limitations with knee pain however.   Allergies  Allergen Reactions  . Codeine Other (See Comments)    Bad dreams  . Lopid [Gemfibrozil] Nausea Only  . Voltaren [Diclofenac Sodium] Diarrhea    Current Outpatient Prescriptions  Medication Sig Dispense Refill  . ADVAIR DISKUS 100-50 MCG/DOSE AEPB INHALE ONE DOSE BY MOUTH EVERY 12 HOURS AS NEEDED  60 each  4  . amLODipine-benazepril (LOTREL) 5-10 MG per capsule TAKE ONE CAPSULE BY MOUTH ONCE DAILY  30 capsule  4  . dipyridamole-aspirin (AGGRENOX) 200-25 MG per 12 hr capsule Take 1 capsule by mouth 2 (two) times daily.      . fish oil-omega-3 fatty acids 1000 MG capsule Take 2 g by mouth daily.        . hydrochlorothiazide (HYDRODIURIL) 25 MG tablet TAKE ONE-HALF TABLET BY MOUTH ONCE DAILY EXCEPT  MONDAY,  WEDNESDAY,  AND  FRIDAY  TAKE  A  WHOLE  TABLET  30 tablet  5  . ibuprofen (ADVIL,MOTRIN) 200 MG tablet Take 200 mg by mouth daily.      . meclizine (ANTIVERT) 32 MG tablet Take 1 tablet (32 mg total) by mouth 4 (four) times daily as needed.  40 tablet  6  . meloxicam (MOBIC) 15 MG tablet Take 1 tablet (15 mg total) by mouth daily.  30 tablet  1  . metFORMIN (GLUCOPHAGE-XR) 500 MG 24 hr tablet TAKE ONE TABLET BY MOUTH TWICE DAILY  60 tablet  3  . metoprolol (LOPRESSOR) 100 MG tablet TAKE ONE TABLET BY MOUTH TWICE DAILY  60 tablet  4  . niacin (NIASPAN)  1000 MG CR tablet TAKE ONE TABLET BY MOUTH AT BEDTIME  30 tablet  5  . NITROSTAT 0.4 MG SL tablet USE AS DIRECTED  25 tablet  1  . PROAIR HFA 108 (90 BASE) MCG/ACT inhaler INHALE TWO PUFFS INTO LUNGS AS DIRECTED 4 TIMES DAILY AS NEEDED  18 each  0  . rosuvastatin (CRESTOR) 10 MG tablet Take 1 tablet (10 mg total) by mouth daily.  30 tablet  4   No current facility-administered medications for this visit.    Past Medical History  Diagnosis Date  . DYSLIPIDEMIA   . HYPERTENSION   . CORONARY ATHEROSCLEROSIS NATIVE CORONARY ARTERY     Myocardial infarction at [facility] in 1996 with a directional atherectomy of the 90% LAD stenosis and medical management of 60-70% proximal circumflex and 50% RCA stenosis.  . Cerebral atherosclerosis   . UPPER RESPIRATORY INFECTION   . ILIAC ARTERY ANEURYSM   . Diabetes mellitus without complication   . Arthritis   . Asthma   . TIA (transient ischemic attack)     Past Surgical History  Procedure Laterality Date  . Intraocular lens insertion    . Right knee surgery    . Repair of rectal fistula  ROS: As stated in the HPI and negative for all other systems.  PHYSICAL EXAM BP 151/83  Pulse 76  Ht 5' 11.5" (1.816 m)  Wt 198 lb (89.812 kg)  BMI 27.23 kg/m2 GENERAL:  Well appearing HEENT:  Pupils equal round and reactive, fundi not visualized, oral mucosa unremarkable NECK:  No jugular venous distention, waveform within normal limits, carotid upstroke brisk and symmetric,soft  right bruit, no thyromegaly LYMPHATICS:  No cervical, inguinal adenopathy LUNGS:  Clear to auscultation bilaterally BACK:  No CVA tenderness CHEST: Unremarkable HEART:  PMI not displaced or sustained,S1 and S2 within normal limits, no S3, no S4, no clicks, no rubs, soft apical early peaking systolic murmur ABD:  Flat, positive bowel sounds normal in frequency in pitch, no bruits, no rebound, no guarding, no midline pulsatile mass, no hepatomegaly, no splenomegaly EXT:   2 plus pulses throughout, no edema, no cyanosis no clubbing SKIN:  No rashes no nodules NEURO:  Cranial nerves II through XII grossly intact, motor grossly intact throughout PSYCH:  Cognitively intact, oriented to person place and time  EKG:  Sinus rhythm, rate 70, axis within normal limits, intervals within normal, no acute ST-T wave changes.  01/26/2014   ASSESSMENT AND PLAN  CAD:  The patient has no new sypmtoms since stress testing in 2012.  No further cardiovascular testing is indicated.  We will continue with aggressive risk reduction and meds as listed.  He is again having knee surgery. I will do a stress test prior to this for risk stratification.  HTN:  The blood pressure is mildly elevated.  However he says that this does not happen at home or in a. No change in medications is indicated. We will continue with therapeutic lifestyle changes (TLC).  HYPERLIPIDEMIA:   Per Redge Gainer, MD  CAROTID STENOSIS:  He had mild plaquing 2015. I will repeat this in 2017

## 2014-01-26 NOTE — Patient Instructions (Signed)
The current medical regimen is effective;  continue present plan and medications.  Follow up in 1 year with Dr Hochrein.  You will receive a letter in the mail 2 months before you are due.  Please call us when you receive this letter to schedule your follow up appointment.  

## 2014-02-08 ENCOUNTER — Other Ambulatory Visit: Payer: Self-pay | Admitting: Family Medicine

## 2014-03-19 ENCOUNTER — Other Ambulatory Visit: Payer: Self-pay | Admitting: Family Medicine

## 2014-04-17 DIAGNOSIS — H6123 Impacted cerumen, bilateral: Secondary | ICD-10-CM | POA: Insufficient documentation

## 2014-04-19 ENCOUNTER — Other Ambulatory Visit: Payer: Self-pay | Admitting: Family Medicine

## 2014-05-02 ENCOUNTER — Encounter: Payer: Self-pay | Admitting: Family Medicine

## 2014-05-02 ENCOUNTER — Ambulatory Visit (INDEPENDENT_AMBULATORY_CARE_PROVIDER_SITE_OTHER): Payer: Medicare Other | Admitting: Family Medicine

## 2014-05-02 VITALS — BP 136/73 | HR 62 | Temp 97.3°F | Ht 71.5 in | Wt 198.0 lb

## 2014-05-02 DIAGNOSIS — N390 Urinary tract infection, site not specified: Secondary | ICD-10-CM

## 2014-05-02 DIAGNOSIS — E119 Type 2 diabetes mellitus without complications: Secondary | ICD-10-CM

## 2014-05-02 DIAGNOSIS — E559 Vitamin D deficiency, unspecified: Secondary | ICD-10-CM

## 2014-05-02 DIAGNOSIS — E8881 Metabolic syndrome: Secondary | ICD-10-CM

## 2014-05-02 DIAGNOSIS — Z23 Encounter for immunization: Secondary | ICD-10-CM

## 2014-05-02 DIAGNOSIS — I10 Essential (primary) hypertension: Secondary | ICD-10-CM

## 2014-05-02 DIAGNOSIS — N400 Enlarged prostate without lower urinary tract symptoms: Secondary | ICD-10-CM

## 2014-05-02 DIAGNOSIS — E785 Hyperlipidemia, unspecified: Secondary | ICD-10-CM

## 2014-05-02 LAB — POCT CBC
Granulocyte percent: 76.6 %G (ref 37–80)
HCT, POC: 44.5 % (ref 43.5–53.7)
HEMOGLOBIN: 15.2 g/dL (ref 14.1–18.1)
Lymph, poc: 2.3 (ref 0.6–3.4)
MCH, POC: 34.2 pg — AB (ref 27–31.2)
MCHC: 34.2 g/dL (ref 31.8–35.4)
MCV: 100.1 fL — AB (ref 80–97)
MPV: 7.4 fL (ref 0–99.8)
POC Granulocyte: 9.2 — AB (ref 2–6.9)
POC LYMPH %: 19.2 % (ref 10–50)
Platelet Count, POC: 174 10*3/uL (ref 142–424)
RBC: 4.5 M/uL — AB (ref 4.69–6.13)
RDW, POC: 12.1 %
WBC: 12 10*3/uL — AB (ref 4.6–10.2)

## 2014-05-02 LAB — POCT URINALYSIS DIPSTICK
BILIRUBIN UA: NEGATIVE
GLUCOSE UA: NEGATIVE
KETONES UA: NEGATIVE
Nitrite, UA: NEGATIVE
Protein, UA: NEGATIVE
SPEC GRAV UA: 1.01
Urobilinogen, UA: NEGATIVE
pH, UA: 6

## 2014-05-02 LAB — POCT UA - MICROSCOPIC ONLY
BACTERIA, U MICROSCOPIC: NEGATIVE
Casts, Ur, LPF, POC: NEGATIVE
Crystals, Ur, HPF, POC: NEGATIVE
Mucus, UA: NEGATIVE
Yeast, UA: NEGATIVE

## 2014-05-02 LAB — POCT GLYCOSYLATED HEMOGLOBIN (HGB A1C): Hemoglobin A1C: 6.2

## 2014-05-02 MED ORDER — MELOXICAM 15 MG PO TABS: 15.0000 mg | ORAL_TABLET | Freq: Every day | ORAL | Status: DC

## 2014-05-02 NOTE — Progress Notes (Signed)
Subjective:    Patient ID: [name], male    DOB: 21-Mar-1930, 80 y.o.   MRN: [id number]  HPI Pt here for follow up and management of chronic multiple medical problems. The patient continues to complain of back pain. He is due to get lab work and a flu shot today. His Metadate will be refill. He will need a rectal exam today.        Patient Active Problem List   Diagnosis Date Noted  . History of TIA (transient ischemic attack) 09/12/2013  . Metabolic syndrome 38/25/0539  . Vitamin D deficiency 08/01/2013  . Diabetes 08/01/2013  . PVD (peripheral vascular disease) 11/25/2011  . ILIAC ARTERY ANEURYSM 07/17/2010  . DYSLIPIDEMIA 08/28/2009  . CORONARY ATHEROSCLEROSIS NATIVE CORONARY ARTERY 08/28/2009  . UPPER RESPIRATORY INFECTION 08/28/2009  . HYPERTENSION 06/05/2009  . CEREBRAL ATHEROSCLEROSIS 06/04/2009   Outpatient Encounter Prescriptions as of 05/02/2014  Medication Sig  . ADVAIR DISKUS 100-50 MCG/DOSE AEPB INHALE ONE DOSE BY MOUTH EVERY 12 HOURS AS NEEDED  . amLODipine-benazepril (LOTREL) 5-10 MG per capsule TAKE ONE CAPSULE BY MOUTH ONCE DAILY  . CRESTOR 10 MG tablet TAKE ONE TABLET BY MOUTH ONCE DAILY  . dipyridamole-aspirin (AGGRENOX) 200-25 MG per 12 hr capsule Take 1 capsule by mouth 2 (two) times daily.  . fish oil-omega-3 fatty acids 1000 MG capsule Take 2 g by mouth daily.    . hydrochlorothiazide (HYDRODIURIL) 25 MG tablet TAKE ONE-HALF TABLET BY MOUTH ONCE DAILY EXCEPT  MONDAY,  WEDNESDAY,  AND  FRIDAY  TAKE  A  WHOLE  TABLET  . ibuprofen (ADVIL,MOTRIN) 200 MG tablet Take 200 mg by mouth daily.  . meclizine (ANTIVERT) 32 MG tablet Take 1 tablet (32 mg total) by mouth 4 (four) times daily as needed.  . meloxicam (MOBIC) 15 MG tablet Take 1 tablet (15 mg total) by mouth daily.  . metFORMIN (GLUCOPHAGE-XR) 500 MG 24 hr tablet TAKE ONE TABLET BY MOUTH TWICE DAILY  . metoprolol (LOPRESSOR) 100 MG tablet TAKE ONE TABLET BY MOUTH TWICE DAILY  . niacin (NIASPAN) 1000  MG CR tablet TAKE ONE TABLET BY MOUTH AT BEDTIME  . NITROSTAT 0.4 MG SL tablet USE AS DIRECTED  . PROAIR HFA 108 (90 BASE) MCG/ACT inhaler INHALE TWO PUFFS INTO LUNGS AS DIRECTED 4 TIMES DAILY AS NEEDED  . [DISCONTINUED] PROAIR HFA 108 (90 BASE) MCG/ACT inhaler INHALE 2 PUFFS AS DIRECTED FOUR TIMES A DAY AS NEEDED  . [DISCONTINUED] PROAIR HFA 108 (90 BASE) MCG/ACT inhaler INHALE TWO PUFFS INTO LUNGS AS DIRECTED 4 TIMES DAILY AS NEEDED    Review of Systems  Constitutional: Negative.   HENT: Negative.   Eyes: Negative.   Respiratory: Negative.   Cardiovascular: Negative.   Gastrointestinal: Negative.   Endocrine: Negative.   Genitourinary: Negative.   Musculoskeletal: Positive for back pain.  Skin: Negative.   Allergic/Immunologic: Negative.   Neurological: Negative.   Hematological: Negative.   Psychiatric/Behavioral: Negative.        Objective:   Physical Exam  Nursing note and vitals reviewed. Constitutional: He is oriented to person, place, and time. He appears well-developed and well-nourished. No distress.  The patient is pleasant and appears to be younger than his stated age  HENT:  Head: Normocephalic and atraumatic.  Right Ear: External ear normal.  Left Ear: External ear normal.  Nose: Nose normal.  Mouth/Throat: Oropharynx is clear and moist. No oropharyngeal exudate.  Eyes: Conjunctivae and EOM are normal. Pupils are equal, round, and reactive to light.  Right eye exhibits no discharge. Left eye exhibits no discharge. No scleral icterus.  Neck: Normal range of motion. Neck supple. No thyromegaly present.  No carotid bruits  Cardiovascular: Normal rate, regular rhythm and normal heart sounds.  Exam reveals no gallop and no friction rub.   No murmur heard. At 72 per minute, distal pulses were diminished  Pulmonary/Chest: Effort normal and breath sounds normal. No respiratory distress. He has no wheezes. He has no rales. He exhibits no tenderness.  Lungs were clear  anteriorly and posteriorly  Abdominal: Soft. Bowel sounds are normal. He exhibits no mass. There is no tenderness. There is no rebound and no guarding.  There were no inguinal hernias or nodes. The patient does have an umbilical hernia.  Genitourinary: Rectum normal and penis normal.  Minimal prostate enlargement. No labs or masses. No rectal masses. No inguinal hernias. External genitalia within normal limits.  Musculoskeletal: Normal range of motion. He exhibits no edema and no tenderness.  Lymphadenopathy:    He has no cervical adenopathy.  Neurological: He is alert and oriented to person, place, and time. He has normal reflexes. No cranial nerve deficit.  Lower extremity reflexes were diminished equally bilaterally  Skin: Skin is warm and dry. No rash noted. No erythema. No pallor.  Psychiatric: He has a normal mood and affect. His behavior is normal. Judgment and thought content normal.   BP 136/73  Pulse 62  Temp(Src) 97.3 F (36.3 C) (Oral)  Ht 5' 11.5" (1.816 m)  Wt 198 lb (89.812 kg)  BMI 27.23 kg/m2        Assessment & Plan:  1. Vitamin D deficiency - POCT CBC - Vit D  25 hydroxy (rtn osteoporosis monitoring)  2. Metabolic syndrome - POCT CBC  3. HYPERTENSION - POCT CBC - BMP8+EGFR - Hepatic function panel  4. DYSLIPIDEMIA - POCT CBC - NMR, lipoprofile  5. Type 2 diabetes mellitus without complication - POCT CBC - POCT glycosylated hemoglobin (Hb A1C)  6. BPH (benign prostatic hyperplasia) - POCT CBC - POCT UA - Microscopic Only - POCT urinalysis dipstick  7. Vitamin D deficiency - POCT CBC - Vit D  25 hydroxy (rtn osteoporosis monitoring)  Meds ordered this encounter  Medications  . meloxicam (MOBIC) 15 MG tablet    Sig: Take 1 tablet (15 mg total) by mouth daily.    Dispense:  30 tablet    Refill:  2   Patient Instructions                       Medicare Annual Wellness Visit  [facility] and the medical providers at [facility] strive to bring you the best medical care.  In doing so we not only want to address your current medical conditions and concerns but also to detect new conditions early and prevent illness, disease and health-related problems.    Medicare offers a yearly Wellness Visit which allows our clinical staff to assess your need for preventative services including immunizations, lifestyle education, counseling to decrease risk of preventable diseases and screening for fall risk and other medical concerns.    This visit is provided free of charge (no copay) for all Medicare recipients. The clinical pharmacists at [facility] have begun to conduct these Wellness Visits which will also include a thorough review of all your medications.    As you primary medical provider recommend that you make an appointment for your Annual Wellness Visit if you  have not done so already this year.  You may set up this appointment before you leave today or you may call back ([phone]) and schedule an appointment.  Please make sure when you call that you mention that you are scheduling your Annual Wellness Visit with the clinical pharmacist so that the appointment may be made for the proper length of time.     Continue current medications. Continue good therapeutic lifestyle changes which include good diet and exercise. Fall precautions discussed with patient. If an FOBT was given today- please return it to our front desk. If you are over 53 years old - you may need Prevnar 48 or the adult Pneumonia vaccine.  Flu Shots will be available at our office starting mid- September. Please call and schedule a FLU CLINIC APPOINTMENT.   Continue to be careful with lifting and preventing falls so that you do not stir up the arthritis in your back Take as little meloxicam as possible because of all the side effects Take Tylenol for pain Protect your respiratory tract to prevent an asthma  flareup Monitor blood pressures and blood sugars at home when possible Always keep your feet checked regularly   Arrie Senate MD

## 2014-05-02 NOTE — Addendum Note (Signed)
Addended by: Pollyann Kennedy F on: 05/02/2014 05:25 PM   Modules accepted: Orders

## 2014-05-02 NOTE — Patient Instructions (Addendum)
Medicare Annual Wellness Visit  [facility] and the medical providers at [facility] strive to bring you the best medical care.  In doing so we not only want to address your current medical conditions and concerns but also to detect new conditions early and prevent illness, disease and health-related problems.    Medicare offers a yearly Wellness Visit which allows our clinical staff to assess your need for preventative services including immunizations, lifestyle education, counseling to decrease risk of preventable diseases and screening for fall risk and other medical concerns.    This visit is provided free of charge (no copay) for all Medicare recipients. The clinical pharmacists at [facility] have begun to conduct these Wellness Visits which will also include a thorough review of all your medications.    As you primary medical provider recommend that you make an appointment for your Annual Wellness Visit if you have not done so already this year.  You may set up this appointment before you leave today or you may call back [phone]) and schedule an appointment.  Please make sure when you call that you mention that you are scheduling your Annual Wellness Visit with the clinical pharmacist so that the appointment may be made for the proper length of time.     Continue current medications. Continue good therapeutic lifestyle changes which include good diet and exercise. Fall precautions discussed with patient. If an FOBT was given today- please return it to our front desk. If you are over 67 years old - you may need Prevnar 75 or the adult Pneumonia vaccine.  Flu Shots will be available at our office starting mid- September. Please call and schedule a FLU CLINIC APPOINTMENT.   Continue to be careful with lifting and preventing falls so that you do not stir up the arthritis in your back Take as little meloxicam as possible  because of all the side effects Take Tylenol for pain Protect your respiratory tract to prevent an asthma flareup Monitor blood pressures and blood sugars at home when possible Always keep your feet checked regularly

## 2014-05-03 LAB — BMP8+EGFR
BUN/Creatinine Ratio: 10 (ref 10–22)
BUN: 13 mg/dL (ref 8–27)
CO2: 25 mmol/L (ref 18–29)
CREATININE: 1.25 mg/dL (ref 0.76–1.27)
Calcium: 9.8 mg/dL (ref 8.6–10.2)
Chloride: 96 mmol/L — ABNORMAL LOW (ref 97–108)
GFR, EST AFRICAN AMERICAN: 62 mL/min/{1.73_m2} (ref >59)
GFR, EST NON AFRICAN AMERICAN: 53 mL/min/{1.73_m2} — AB (ref >59)
GLUCOSE: 158 mg/dL — AB (ref 65–99)
Potassium: 4.1 mmol/L (ref 3.5–5.2)
Sodium: 138 mmol/L (ref 134–144)

## 2014-05-03 LAB — HEPATIC FUNCTION PANEL
ALK PHOS: 72 IU/L (ref 39–117)
ALT: 43 IU/L (ref 0–44)
AST: 43 IU/L — ABNORMAL HIGH (ref 0–40)
Albumin: 4.6 g/dL (ref 3.5–4.7)
BILIRUBIN DIRECT: 0.21 mg/dL (ref 0.00–0.40)
Total Bilirubin: 0.8 mg/dL (ref 0.0–1.2)
Total Protein: 7.1 g/dL (ref 6.0–8.5)

## 2014-05-03 LAB — NMR, LIPOPROFILE
CHOLESTEROL: 124 mg/dL (ref 100–199)
HDL CHOLESTEROL BY NMR: 49 mg/dL (ref >39)
HDL PARTICLE NUMBER: 37.1 umol/L (ref >=30.5)
LDL Particle Number: 635 nmol/L (ref <1000)
LDL Size: 20.4 nm (ref >20.5)
LDLC SERPL CALC-MCNC: 49 mg/dL (ref 0–99)
LP-IR Score: 60 — ABNORMAL HIGH (ref <=45)
SMALL LDL PARTICLE NUMBER: 396 nmol/L (ref <=527)
Triglycerides by NMR: 131 mg/dL (ref 0–149)

## 2014-05-03 LAB — VITAMIN D 25 HYDROXY (VIT D DEFICIENCY, FRACTURES): VIT D 25 HYDROXY: 38 ng/mL (ref 30.0–100.0)

## 2014-05-04 LAB — URINE CULTURE: Organism ID, Bacteria: NO GROWTH

## 2014-05-25 ENCOUNTER — Other Ambulatory Visit: Payer: Self-pay | Admitting: Family Medicine

## 2014-06-26 ENCOUNTER — Other Ambulatory Visit: Payer: Self-pay | Admitting: Family Medicine

## 2014-07-31 ENCOUNTER — Other Ambulatory Visit: Payer: Self-pay | Admitting: Family Medicine

## 2014-08-29 ENCOUNTER — Other Ambulatory Visit: Payer: Self-pay | Admitting: Family Medicine

## 2014-10-01 ENCOUNTER — Ambulatory Visit (INDEPENDENT_AMBULATORY_CARE_PROVIDER_SITE_OTHER): Payer: Medicare Other | Admitting: Family Medicine

## 2014-10-01 ENCOUNTER — Encounter: Payer: Self-pay | Admitting: *Deleted

## 2014-10-01 ENCOUNTER — Encounter: Payer: Self-pay | Admitting: Family Medicine

## 2014-10-01 ENCOUNTER — Ambulatory Visit (INDEPENDENT_AMBULATORY_CARE_PROVIDER_SITE_OTHER): Payer: Medicare Other

## 2014-10-01 ENCOUNTER — Other Ambulatory Visit: Payer: Self-pay | Admitting: Family Medicine

## 2014-10-01 VITALS — BP 150/77 | HR 77 | Temp 97.2°F | Ht 71.5 in | Wt 202.0 lb

## 2014-10-01 DIAGNOSIS — N400 Enlarged prostate without lower urinary tract symptoms: Secondary | ICD-10-CM | POA: Diagnosis not present

## 2014-10-01 DIAGNOSIS — E785 Hyperlipidemia, unspecified: Secondary | ICD-10-CM

## 2014-10-01 DIAGNOSIS — I739 Peripheral vascular disease, unspecified: Secondary | ICD-10-CM | POA: Diagnosis not present

## 2014-10-01 DIAGNOSIS — M79672 Pain in left foot: Secondary | ICD-10-CM

## 2014-10-01 DIAGNOSIS — J069 Acute upper respiratory infection, unspecified: Secondary | ICD-10-CM

## 2014-10-01 DIAGNOSIS — I10 Essential (primary) hypertension: Secondary | ICD-10-CM

## 2014-10-01 DIAGNOSIS — M79671 Pain in right foot: Secondary | ICD-10-CM

## 2014-10-01 DIAGNOSIS — R05 Cough: Secondary | ICD-10-CM

## 2014-10-01 DIAGNOSIS — E559 Vitamin D deficiency, unspecified: Secondary | ICD-10-CM

## 2014-10-01 DIAGNOSIS — R059 Cough, unspecified: Secondary | ICD-10-CM

## 2014-10-01 DIAGNOSIS — E119 Type 2 diabetes mellitus without complications: Secondary | ICD-10-CM

## 2014-10-01 LAB — POCT CBC
GRANULOCYTE PERCENT: 64.9 % (ref 37–80)
HCT, POC: 46.8 % (ref 43.5–53.7)
Hemoglobin: 15.2 g/dL (ref 14.1–18.1)
LYMPH, POC: 2.4 (ref 0.6–3.4)
MCH, POC: 32.2 pg — AB (ref 27–31.2)
MCHC: 32.4 g/dL (ref 31.8–35.4)
MCV: 99.2 fL — AB (ref 80–97)
MPV: 8 fL (ref 0–99.8)
PLATELET COUNT, POC: 188 10*3/uL (ref 142–424)
POC GRANULOCYTE: 5.4 (ref 2–6.9)
POC LYMPH PERCENT: 29.3 %L (ref 10–50)
RBC: 4.72 M/uL (ref 4.69–6.13)
RDW, POC: 11.7 %
WBC: 8.3 10*3/uL (ref 4.6–10.2)

## 2014-10-01 LAB — POCT GLYCOSYLATED HEMOGLOBIN (HGB A1C)

## 2014-10-01 LAB — POCT UA - MICROALBUMIN: Microalbumin Ur, POC: 20 mg/L

## 2014-10-01 MED ORDER — AMLODIPINE BESYLATE 5 MG PO TABS: 5.0000 mg | ORAL_TABLET | Freq: Every day | ORAL | Status: DC

## 2014-10-01 MED ORDER — BENAZEPRIL HCL 10 MG PO TABS: 10.0000 mg | ORAL_TABLET | Freq: Every day | ORAL | Status: DC

## 2014-10-01 NOTE — Progress Notes (Signed)
 Subjective:    Patient ID: [name], male    DOB: 07/25/1930, 81 y.o.   MRN: [id number]  HPI Pt here for follow up and management of chronic medical problems which includes hypertension, hyperlipidemia and diabetes. He is taking medications regularly. The patient is having some issues with insurance coverage for a couple of his medications. This includes his Aggrenox and his Benicar. He is also having some lingering congestion in the head and some cough following a cold that he had several weeks ago. He continues to have back pain at times.          Patient Active Problem List   Diagnosis Date Noted  . History of TIA (transient ischemic attack) 09/12/2013  . Metabolic syndrome 08/01/2013  . Vitamin D deficiency 08/01/2013  . Diabetes 08/01/2013  . PVD (peripheral vascular disease) 11/25/2011  . ILIAC ARTERY ANEURYSM 07/17/2010  . DYSLIPIDEMIA 08/28/2009  . CORONARY ATHEROSCLEROSIS NATIVE CORONARY ARTERY 08/28/2009  . UPPER RESPIRATORY INFECTION 08/28/2009  . HYPERTENSION 06/05/2009  . CEREBRAL ATHEROSCLEROSIS 06/04/2009   Outpatient Encounter Prescriptions as of 10/01/2014  Medication Sig  . ADVAIR DISKUS 100-50 MCG/DOSE AEPB INHALE ONE DOSE BY MOUTH EVERY 12 HOURS AS NEEDED  . amLODipine-benazepril (LOTREL) 5-10 MG per capsule TAKE ONE CAPSULE BY MOUTH ONCE DAILY  . CRESTOR 10 MG tablet TAKE ONE TABLET BY MOUTH ONCE DAILY  . dipyridamole-aspirin (AGGRENOX) 200-25 MG per 12 hr capsule Take one po bid  . fish oil-omega-3 fatty acids 1000 MG capsule Take 2 g by mouth daily.    . hydrochlorothiazide (HYDRODIURIL) 25 MG tablet TAKE ONE-HALF TABLET BY MOUTH ONCE DAILY EXCEPT  MONDAY,  WEDNESDAY,  AND  FRIDAY  TAKE  A  WHOLE  TABLET  . ibuprofen (ADVIL,MOTRIN) 200 MG tablet Take 200 mg by mouth daily.  . metFORMIN (GLUCOPHAGE-XR) 500 MG 24 hr tablet TAKE ONE TABLET BY MOUTH TWICE DAILY  . metoprolol (LOPRESSOR) 100 MG tablet TAKE ONE TABLET BY MOUTH TWICE DAILY  . niacin  (NIASPAN) 1000 MG CR tablet TAKE ONE TABLET BY MOUTH AT BEDTIME  . PROAIR HFA 108 (90 BASE) MCG/ACT inhaler INHALE TWO PUFFS INTO LUNGS AS DIRECTED 4 TIMES DAILY AS NEEDED  . meclizine (ANTIVERT) 32 MG tablet Take 1 tablet (32 mg total) by mouth 4 (four) times daily as needed. (Patient not taking: Reported on 10/01/2014)  . meloxicam (MOBIC) 15 MG tablet Take 1 tablet (15 mg total) by mouth daily. (Patient not taking: Reported on 10/01/2014)  . NITROSTAT 0.4 MG SL tablet USE AS DIRECTED (Patient not taking: Reported on 10/01/2014)  . [DISCONTINUED] metFORMIN (GLUCOPHAGE-XR) 500 MG 24 hr tablet TAKE ONE TABLET BY MOUTH TWICE DAILY    Review of Systems  Constitutional: Negative.   HENT: Positive for congestion (recent cold - "can't shake congestion").   Eyes: Negative.   Respiratory: Negative.   Cardiovascular: Negative.   Gastrointestinal: Negative.   Endocrine: Negative.   Genitourinary: Negative.   Musculoskeletal: Positive for back pain (at times).  Skin: Negative.   Allergic/Immunologic: Negative.   Neurological: Negative.   Hematological: Negative.   Psychiatric/Behavioral: Negative.        Objective:   Physical Exam  Constitutional: He is oriented to person, place, and time. He appears well-developed and well-nourished. No distress.  HENT:  Head: Normocephalic and atraumatic.  Right Ear: External ear normal.  Left Ear: External ear normal.  Mouth/Throat: Oropharynx is clear and moist. No oropharyngeal exudate.  There is nasal congestion right greater than left.    Eyes: Conjunctivae and EOM are normal. Pupils are equal, round, and reactive to light. Right eye exhibits no discharge. Left eye exhibits no discharge. No scleral icterus.  Neck: Normal range of motion. Neck supple. No thyromegaly present.  There is a right supraclavicular bruit.  Cardiovascular: Normal rate, regular rhythm and normal heart sounds.  Exam reveals no gallop and no friction rub.   No murmur heard. The  pulses in both feet were difficult to palpate. Also the pulses in the right radial area was difficult to palpate.  Pulmonary/Chest: Effort normal and breath sounds normal. No respiratory distress. He has no wheezes. He has no rales. He exhibits no tenderness.  The lungs were clear anteriorly and posteriorly  Abdominal: Soft. Bowel sounds are normal. He exhibits no mass. There is no tenderness. There is no rebound and no guarding.  The abdomen was obese but without masses or tenderness or bruits  Musculoskeletal: Normal range of motion. He exhibits no edema or tenderness.  He is having bilateral foot pain but the balls of his feet were not tender to palpation.  Lymphadenopathy:    He has no cervical adenopathy.  Neurological: He is alert and oriented to person, place, and time. He has normal reflexes. No cranial nerve deficit.  Skin: Skin is warm and dry. No rash noted. No erythema. No pallor.  Psychiatric: He has a normal mood and affect. His behavior is normal. Judgment and thought content normal.  Nursing note and vitals reviewed.  BP 150/77 mmHg  Pulse 77  Temp(Src) 97.2 F (36.2 C) (Oral)  Ht 5' 11.5" (1.816 m)  Wt 202 lb (91.627 kg)  BMI 27.78 kg/m2  [facility] reading (PRIMARY) by  Dr. Moore-chest x-ray, bilateral feet.  Chest x-ray, no active disease.  Heel spurs and degenerative changes and calcified blood vessels                                     Assessment & Plan:  1. Vitamin D deficiency -Continue current treatment pending results of vitamin D level today. - POCT CBC - Vit D  25 hydroxy (rtn osteoporosis monitoring)  2. Type 2 diabetes mellitus without complication -Continue current treatment pending results of lab work today. - POCT glycosylated hemoglobin (Hb A1C) - POCT CBC - BMP8+EGFR - POCT UA - Microalbumin  3. BPH (benign prostatic hyperplasia) -The patient is having no complaints with passing his water and no treatment recommendations were given. - POCT  CBC  4. Essential hypertension -The patient should continue with his Crestor and omega-3 fatty acids and Niaspan. Also continue with aggressive therapeutic lifestyle changes. -Continue with antihypertension control which include amlodipine and Lotensin. He will be given separate prescriptions for these 2 medications because of insurance demands. - POCT CBC - BMP8+EGFR - Hepatic function panel - DG Chest 2 View; Future - POCT UA - Microalbumin - amLODipine (NORVASC) 5 MG tablet; Take 1 tablet (5 mg total) by mouth daily.  Dispense: 90 tablet; Refill: 3 - benazepril (LOTENSIN) 10 MG tablet; Take 1 tablet (10 mg total) by mouth daily.  Dispense: 90 tablet; Refill: 3  5. Hyperlipidemia -Continue with current cholesterol medications - POCT CBC - NMR, lipoprofile - DG Chest 2 View; Future  6. Bilateral foot pain -Use gel inserts -Take extra strength Tylenol for pain and take as little meloxicam as possible due to its impact on your kidneys - DG Foot Complete Left; Future -   DG Foot Complete Right; Future  7. URI (upper respiratory infection) -Keep the house cooler and use a cool mist humidifier and nasal saline  8. Cough -Check with the pharmacist regarding smaller size versions of Mucinex maximum strength and take this twice daily with a large glass of water  9. Peripheral vascular insufficiency -Please discuss the circulation issues in your feet with Dr. Danella Deis and make sure that when you get your Dopplers done that they check the circulation in your lower extremities  Meds ordered this encounter  Medications  . amLODipine (NORVASC) 5 MG tablet    Sig: Take 1 tablet (5 mg total) by mouth daily.    Dispense:  90 tablet    Refill:  3  . benazepril (LOTENSIN) 10 MG tablet    Sig: Take 1 tablet (10 mg total) by mouth daily.    Dispense:  90 tablet    Refill:  3   Patient Instructions                       Medicare Annual Wellness Visit  [facility] and the medical providers at  [facility] strive to bring you the best medical care.  In doing so we not only want to address your current medical conditions and concerns but also to detect new conditions early and prevent illness, disease and health-related problems.    Medicare offers a yearly Wellness Visit which allows our clinical staff to assess your need for preventative services including immunizations, lifestyle education, counseling to decrease risk of preventable diseases and screening for fall risk and other medical concerns.    This visit is provided free of charge (no copay) for all Medicare recipients. The clinical pharmacists at [facility] have begun to conduct these Wellness Visits which will also include a thorough review of all your medications.    As you primary medical provider recommend that you make an appointment for your Annual Wellness Visit if you have not done so already this year.  You may set up this appointment before you leave today or you may call back ([phone]) and schedule an appointment.  Please make sure when you call that you mention that you are scheduling your Annual Wellness Visit with the clinical pharmacist so that the appointment may be made for the proper length of time.     Continue current medications. Continue good therapeutic lifestyle changes which include good diet and exercise. Fall precautions discussed with patient. If an FOBT was given today- please return it to our front desk. If you are over 36 years old - you may need Prevnar 52 or the adult Pneumonia vaccine.  Flu Shots are still available at our office. If you still haven't had one please call to set up a nurse visit to get one.   After your visit with [place] today you will receive a survey in the mail or online from [employer] regarding your care with [place]. Please take a moment to fill this out. Your feedback is very important to [place] as you can help [place] better understand your  patient needs as well as improve your experience and satisfaction. WE CARE ABOUT YOU!!!   The patient should continue to drink plenty of water and fluids. He should use a cool mist humidifier in his bedroom at night and keep the house as cool as possible He should continue taking Mucinex maximum strength or a smaller version of the same by mouth 1  twice daily for cough and congestion with a large glass of water Use nasal saline for nasal congestion Consider getting some gel inserts for your she used because of the foot pain you have been having.   Don W. Moore MD   

## 2014-10-01 NOTE — Patient Instructions (Addendum)
Medicare Annual Wellness Visit  [facility] and the medical providers at [facility] strive to bring you the best medical care.  In doing so we not only want to address your current medical conditions and concerns but also to detect new conditions early and prevent illness, disease and health-related problems.    Medicare offers a yearly Wellness Visit which allows our clinical staff to assess your need for preventative services including immunizations, lifestyle education, counseling to decrease risk of preventable diseases and screening for fall risk and other medical concerns.    This visit is provided free of charge (no copay) for all Medicare recipients. The clinical pharmacists at [facility] have begun to conduct these Wellness Visits which will also include a thorough review of all your medications.    As you primary medical provider recommend that you make an appointment for your Annual Wellness Visit if you have not done so already this year.  You may set up this appointment before you leave today or you may call back [phone]) and schedule an appointment.  Please make sure when you call that you mention that you are scheduling your Annual Wellness Visit with the clinical pharmacist so that the appointment may be made for the proper length of time.     Continue current medications. Continue good therapeutic lifestyle changes which include good diet and exercise. Fall precautions discussed with patient. If an FOBT was given today- please return it to our front desk. If you are over 103 years old - you may need Prevnar 67 or the adult Pneumonia vaccine.  Flu Shots are still available at our office. If you still haven't had one please call to set up a nurse visit to get one.   After your visit with [place] today you will receive a survey in the mail or online from [employer] regarding your care with [place]. Please take a moment to  fill this out. Your feedback is very important to [place] as you can help [place] better understand your patient needs as well as improve your experience and satisfaction. WE CARE ABOUT YOU!!!   The patient should continue to drink plenty of water and fluids. He should use a cool mist humidifier in his bedroom at night and keep the house as cool as possible He should continue taking Mucinex maximum strength or a smaller version of the same by mouth 1 twice daily for cough and congestion with a large glass of water Use nasal saline for nasal congestion Consider getting some gel inserts for your she used because of the foot pain you have been having.

## 2014-10-02 LAB — BMP8+EGFR
BUN / CREAT RATIO: 11 (ref 10–22)
BUN: 14 mg/dL (ref 8–27)
CHLORIDE: 95 mmol/L — AB (ref 97–108)
CO2: 24 mmol/L (ref 18–29)
Calcium: 9.6 mg/dL (ref 8.6–10.2)
Creatinine, Ser: 1.26 mg/dL (ref 0.76–1.27)
GFR calc Af Amer: 61 mL/min/{1.73_m2} (ref >59)
GFR calc non Af Amer: 53 mL/min/{1.73_m2} — ABNORMAL LOW (ref >59)
Glucose: 177 mg/dL — ABNORMAL HIGH (ref 65–99)
POTASSIUM: 4.1 mmol/L (ref 3.5–5.2)
Sodium: 136 mmol/L (ref 134–144)

## 2014-10-02 LAB — MICROALBUMIN, URINE: Microalbumin, Urine: 6.3 ug/mL (ref 0.0–17.0)

## 2014-10-02 LAB — NMR, LIPOPROFILE
Cholesterol: 125 mg/dL (ref 100–199)
HDL Cholesterol by NMR: 51 mg/dL (ref >39)
HDL PARTICLE NUMBER: 31.6 umol/L (ref >=30.5)
LDL Particle Number: 719 nmol/L (ref <1000)
LDL Size: 20.2 nm (ref >20.5)
LDL-C: 54 mg/dL (ref 0–99)
LP-IR Score: 62 — ABNORMAL HIGH (ref <=45)
SMALL LDL PARTICLE NUMBER: 468 nmol/L (ref <=527)
TRIGLYCERIDES BY NMR: 102 mg/dL (ref 0–149)

## 2014-10-02 LAB — HEPATIC FUNCTION PANEL
ALBUMIN: 4.9 g/dL — AB (ref 3.5–4.7)
ALT: 18 IU/L (ref 0–44)
AST: 24 IU/L (ref 0–40)
Alkaline Phosphatase: 73 IU/L (ref 39–117)
Bilirubin Total: 0.8 mg/dL (ref 0.0–1.2)
Bilirubin, Direct: 0.21 mg/dL (ref 0.00–0.40)
Total Protein: 7.2 g/dL (ref 6.0–8.5)

## 2014-10-02 LAB — VITAMIN D 25 HYDROXY (VIT D DEFICIENCY, FRACTURES): VIT D 25 HYDROXY: 26.1 ng/mL — AB (ref 30.0–100.0)

## 2014-10-03 ENCOUNTER — Other Ambulatory Visit: Payer: Self-pay | Admitting: *Deleted

## 2014-10-03 ENCOUNTER — Telehealth: Payer: Self-pay | Admitting: *Deleted

## 2014-10-03 ENCOUNTER — Other Ambulatory Visit: Payer: Self-pay | Admitting: Radiology

## 2014-10-03 DIAGNOSIS — I6523 Occlusion and stenosis of bilateral carotid arteries: Secondary | ICD-10-CM

## 2014-10-03 MED ORDER — VITAMIN D (ERGOCALCIFEROL) 1.25 MG (50000 UNIT) PO CAPS: 50000.0000 [IU] | ORAL_CAPSULE | ORAL | Status: DC

## 2014-10-03 NOTE — Telephone Encounter (Signed)
Pt notified of results Verbalizes understanding 

## 2014-10-03 NOTE — Progress Notes (Signed)
Pt notified of results

## 2014-10-03 NOTE — Telephone Encounter (Signed)
-----   Message from Chipper Herb, MD sent at 10/02/2014  9:54 PM EST ----- The urine microalbumin was low and within normal limits

## 2014-10-17 ENCOUNTER — Encounter (HOSPITAL_COMMUNITY): Payer: Medicare Other

## 2014-10-18 ENCOUNTER — Encounter (HOSPITAL_COMMUNITY): Payer: Self-pay

## 2014-10-18 ENCOUNTER — Ambulatory Visit (HOSPITAL_COMMUNITY): Payer: Medicare Other | Attending: Cardiology | Admitting: Cardiology

## 2014-10-18 DIAGNOSIS — I6523 Occlusion and stenosis of bilateral carotid arteries: Secondary | ICD-10-CM | POA: Insufficient documentation

## 2014-10-18 NOTE — Progress Notes (Signed)
Carotid duplex performed 

## 2014-10-22 ENCOUNTER — Other Ambulatory Visit: Payer: Self-pay | Admitting: Dermatology

## 2014-10-25 ENCOUNTER — Encounter: Payer: Self-pay | Admitting: Cardiology

## 2014-10-25 NOTE — Telephone Encounter (Signed)
This encounter was created in error - please disregard.

## 2014-10-25 NOTE — Telephone Encounter (Signed)
Returning your call. °

## 2014-10-29 ENCOUNTER — Other Ambulatory Visit: Payer: Self-pay | Admitting: Family Medicine

## 2014-11-29 ENCOUNTER — Other Ambulatory Visit: Payer: Self-pay | Admitting: Dermatology

## 2014-12-28 ENCOUNTER — Other Ambulatory Visit: Payer: Self-pay | Admitting: Family Medicine

## 2015-01-05 ENCOUNTER — Other Ambulatory Visit: Payer: Self-pay | Admitting: Family Medicine

## 2015-02-07 ENCOUNTER — Ambulatory Visit: Payer: Medicare Other | Admitting: Family Medicine

## 2015-02-08 ENCOUNTER — Encounter: Payer: Self-pay | Admitting: Family Medicine

## 2015-02-08 ENCOUNTER — Ambulatory Visit (INDEPENDENT_AMBULATORY_CARE_PROVIDER_SITE_OTHER): Payer: Medicare Other | Admitting: Family Medicine

## 2015-02-08 VITALS — BP 137/82 | HR 59 | Temp 96.9°F | Ht 71.5 in | Wt 198.0 lb

## 2015-02-08 DIAGNOSIS — I723 Aneurysm of iliac artery: Secondary | ICD-10-CM | POA: Diagnosis not present

## 2015-02-08 DIAGNOSIS — I10 Essential (primary) hypertension: Secondary | ICD-10-CM

## 2015-02-08 DIAGNOSIS — E785 Hyperlipidemia, unspecified: Secondary | ICD-10-CM | POA: Diagnosis not present

## 2015-02-08 DIAGNOSIS — M2550 Pain in unspecified joint: Secondary | ICD-10-CM | POA: Diagnosis not present

## 2015-02-08 DIAGNOSIS — I739 Peripheral vascular disease, unspecified: Secondary | ICD-10-CM | POA: Diagnosis not present

## 2015-02-08 DIAGNOSIS — E559 Vitamin D deficiency, unspecified: Secondary | ICD-10-CM

## 2015-02-08 DIAGNOSIS — N400 Enlarged prostate without lower urinary tract symptoms: Secondary | ICD-10-CM

## 2015-02-08 DIAGNOSIS — N5201 Erectile dysfunction due to arterial insufficiency: Secondary | ICD-10-CM | POA: Diagnosis not present

## 2015-02-08 DIAGNOSIS — E119 Type 2 diabetes mellitus without complications: Secondary | ICD-10-CM

## 2015-02-08 LAB — POCT CBC
Granulocyte percent: 64.1 %G (ref 37–80)
HCT, POC: 42.7 % — AB (ref 43.5–53.7)
Hemoglobin: 14.7 g/dL (ref 14.1–18.1)
Lymph, poc: 2.4 (ref 0.6–3.4)
MCH, POC: 33.7 pg — AB (ref 27–31.2)
MCHC: 34.5 g/dL (ref 31.8–35.4)
MCV: 97.7 fL — AB (ref 80–97)
MPV: 8.7 fL (ref 0–99.8)
POC GRANULOCYTE: 4.8 (ref 2–6.9)
POC LYMPH %: 32 % (ref 10–50)
Platelet Count, POC: 175 10*3/uL (ref 142–424)
RBC: 4.37 M/uL — AB (ref 4.69–6.13)
RDW, POC: 11.9 %
WBC: 7.5 10*3/uL (ref 4.6–10.2)

## 2015-02-08 LAB — POCT GLYCOSYLATED HEMOGLOBIN (HGB A1C): HEMOGLOBIN A1C: 6.6

## 2015-02-08 MED ORDER — HYDROCHLOROTHIAZIDE 25 MG PO TABS: ORAL_TABLET | ORAL | Status: DC

## 2015-02-08 MED ORDER — ALBUTEROL SULFATE HFA 108 (90 BASE) MCG/ACT IN AERS: INHALATION_SPRAY | RESPIRATORY_TRACT | Status: DC

## 2015-02-08 MED ORDER — METOPROLOL TARTRATE 100 MG PO TABS: 100.0000 mg | ORAL_TABLET | Freq: Two times a day (BID) | ORAL | Status: DC

## 2015-02-08 MED ORDER — FLUTICASONE-SALMETEROL 100-50 MCG/DOSE IN AEPB: INHALATION_SPRAY | RESPIRATORY_TRACT | Status: DC

## 2015-02-08 MED ORDER — NIACIN ER (ANTIHYPERLIPIDEMIC) 1000 MG PO TBCR: 1000.0000 mg | EXTENDED_RELEASE_TABLET | Freq: Every day | ORAL | Status: DC

## 2015-02-08 MED ORDER — BENAZEPRIL HCL 10 MG PO TABS: 10.0000 mg | ORAL_TABLET | Freq: Every day | ORAL | Status: DC

## 2015-02-08 MED ORDER — AMLODIPINE BESYLATE 5 MG PO TABS: 5.0000 mg | ORAL_TABLET | Freq: Every day | ORAL | Status: DC

## 2015-02-08 MED ORDER — ASPIRIN-DIPYRIDAMOLE ER 25-200 MG PO CP12: 1.0000 | ORAL_CAPSULE | Freq: Two times a day (BID) | ORAL | Status: DC

## 2015-02-08 MED ORDER — VARDENAFIL HCL 20 MG PO TABS: 20.0000 mg | ORAL_TABLET | Freq: Every day | ORAL | Status: DC | PRN

## 2015-02-08 MED ORDER — ROSUVASTATIN CALCIUM 10 MG PO TABS: 10.0000 mg | ORAL_TABLET | Freq: Every day | ORAL | Status: DC

## 2015-02-08 MED ORDER — METFORMIN HCL ER 500 MG PO TB24: 500.0000 mg | ORAL_TABLET | Freq: Two times a day (BID) | ORAL | Status: DC

## 2015-02-08 NOTE — Patient Instructions (Addendum)
Medicare Annual Wellness Visit  [facility] and the medical providers at [facility] strive to bring you the best medical care.  In doing so we not only want to address your current medical conditions and concerns but also to detect new conditions early and prevent illness, disease and health-related problems.    Medicare offers a yearly Wellness Visit which allows our clinical staff to assess your need for preventative services including immunizations, lifestyle education, counseling to decrease risk of preventable diseases and screening for fall risk and other medical concerns.    This visit is provided free of charge (no copay) for all Medicare recipients. The clinical pharmacists at [facility] have begun to conduct these Wellness Visits which will also include a thorough review of all your medications.    As you primary medical provider recommend that you make an appointment for your Annual Wellness Visit if you have not done so already this year.  You may set up this appointment before you leave today or you may call back ([phone]) and schedule an appointment.  Please make sure when you call that you mention that you are scheduling your Annual Wellness Visit with the clinical pharmacist so that the appointment may be made for the proper length of time.     Continue current medications. Continue good therapeutic lifestyle changes which include good diet and exercise. Fall precautions discussed with patient. If an FOBT was given today- please return it to our front desk. If you are over 16 years old - you may need Prevnar 58 or the adult Pneumonia vaccine.  Flu Shots are still available at our office. If you still haven't had one please call to set up a nurse visit to get one.   After your visit with [place] today you will receive a survey in the mail or online from [employer] regarding your care with [place]. Please take a moment to  fill this out. Your feedback is very important to [place] as you can help [place] better understand your patient needs as well as improve your experience and satisfaction. WE CARE ABOUT YOU!!!   The patient should constantly be aware of climbing and trying to prevent injuries from falls He should continue with his follow-up with his ophthalmologist, his cardiologist This summer drink plenty of fluids Continue to monitor blood sugars regularly and feet regularly

## 2015-02-08 NOTE — Progress Notes (Signed)
Subjective:    Patient ID: [name], male    DOB: 12/07/1929, 81 y.o.   MRN: [id number]  HPI Pt here for follow up and management of chronic medical problems which includes hypertension and hyperlipidemia. He is taking medications regularly. The patient comes in today looking good and feeling well with no specific complaints.      Patient Active Problem List   Diagnosis Date Noted  . History of TIA (transient ischemic attack) 09/12/2013  . Metabolic syndrome 44/08/270  . Vitamin D deficiency 08/01/2013  . Diabetes 08/01/2013  . PVD (peripheral vascular disease) 11/25/2011  . ILIAC ARTERY ANEURYSM 07/17/2010  . DYSLIPIDEMIA 08/28/2009  . CORONARY ATHEROSCLEROSIS NATIVE CORONARY ARTERY 08/28/2009  . UPPER RESPIRATORY INFECTION 08/28/2009  . HYPERTENSION 06/05/2009  . CEREBRAL ATHEROSCLEROSIS 06/04/2009   Outpatient Encounter Prescriptions as of 02/08/2015  Medication Sig  . albuterol (PROAIR HFA) 108 (90 BASE) MCG/ACT inhaler INHALE TWO PUFFS INTO LUNGS 4 TIMES DAILY AS NEEDED  . amLODipine (NORVASC) 5 MG tablet Take 1 tablet (5 mg total) by mouth daily.  . benazepril (LOTENSIN) 10 MG tablet Take 1 tablet (10 mg total) by mouth daily.  Marland Kitchen dipyridamole-aspirin (AGGRENOX) 200-25 MG per 12 hr capsule Take 1 capsule by mouth 2 (two) times daily.  . fish oil-omega-3 fatty acids 1000 MG capsule Take 2 g by mouth daily.    . Fluticasone-Salmeterol (ADVAIR DISKUS) 100-50 MCG/DOSE AEPB INHALE ONE DOSE BY MOUTH EVERY 12 HOURS AS NEEDED  . hydrochlorothiazide (HYDRODIURIL) 25 MG tablet TAKE ONE-HALF TABLET BY MOUTH ONCE DAILY EXCEPT  ON  MONDAY,  WEDNESDAY  AND  FRIDAY  TAKE  A  WHOLE  TABLET  . ibuprofen (ADVIL,MOTRIN) 200 MG tablet Take 200 mg by mouth daily.  . meclizine (ANTIVERT) 32 MG tablet Take 1 tablet (32 mg total) by mouth 4 (four) times daily as needed.  . meloxicam (MOBIC) 15 MG tablet Take 1 tablet (15 mg total) by mouth daily.  . metFORMIN (GLUCOPHAGE-XR) 500 MG 24 hr  tablet Take 1 tablet (500 mg total) by mouth 2 (two) times daily.  . metoprolol (LOPRESSOR) 100 MG tablet Take 1 tablet (100 mg total) by mouth 2 (two) times daily.  . niacin (NIASPAN) 1000 MG CR tablet Take 1 tablet (1,000 mg total) by mouth at bedtime.  Marland Kitchen NITROSTAT 0.4 MG SL tablet USE AS DIRECTED  . rosuvastatin (CRESTOR) 10 MG tablet Take 1 tablet (10 mg total) by mouth daily.  . Vitamin D, Ergocalciferol, (DRISDOL) 50000 UNITS CAPS capsule Take 1 capsule (50,000 Units total) by mouth every 7 (seven) days.  . [DISCONTINUED] ADVAIR DISKUS 100-50 MCG/DOSE AEPB INHALE ONE DOSE BY MOUTH EVERY 12 HOURS AS NEEDED  . [DISCONTINUED] amLODipine (NORVASC) 5 MG tablet Take 1 tablet (5 mg total) by mouth daily.  . [DISCONTINUED] benazepril (LOTENSIN) 10 MG tablet Take 1 tablet (10 mg total) by mouth daily.  . [DISCONTINUED] CRESTOR 10 MG tablet TAKE ONE TABLET BY MOUTH ONCE DAILY  . [DISCONTINUED] dipyridamole-aspirin (AGGRENOX) 200-25 MG per 12 hr capsule TAKE 1 TABLET BY MOUTH 2 TIMES A DAY  . [DISCONTINUED] hydrochlorothiazide (HYDRODIURIL) 25 MG tablet TAKE ONE-HALF TABLET BY MOUTH ONCE DAILY EXCEPT  ON  MONDAY,  WEDNESDAY  AND  FRIDAY  TAKE  A  WHOLE  TABLET  . [DISCONTINUED] metFORMIN (GLUCOPHAGE-XR) 500 MG 24 hr tablet TAKE ONE TABLET BY MOUTH TWICE DAILY  . [DISCONTINUED] metoprolol (LOPRESSOR) 100 MG tablet TAKE ONE TABLET BY MOUTH TWICE DAILY  . [DISCONTINUED] niacin (  NIASPAN) 1000 MG CR tablet TAKE ONE TABLET BY MOUTH AT BEDTIME  . [DISCONTINUED] PROAIR HFA 108 (90 BASE) MCG/ACT inhaler INHALE TWO PUFFS INTO LUNGS 4 TIMES DAILY AS NEEDED  . [DISCONTINUED] amLODipine-benazepril (LOTREL) 5-10 MG per capsule TAKE ONE CAPSULE BY MOUTH ONCE DAILY  . [DISCONTINUED] niacin (NIASPAN) 1000 MG CR tablet TAKE ONE TABLET BY MOUTH AT BEDTIME   No facility-administered encounter medications on file as of 02/08/2015.     Review of Systems  Constitutional: Negative.   HENT: Negative.   Eyes: Negative.     Respiratory: Negative.   Cardiovascular: Negative.   Gastrointestinal: Negative.   Endocrine: Negative.   Genitourinary: Negative.   Musculoskeletal: Negative.   Skin: Negative.   Allergic/Immunologic: Negative.   Neurological: Negative.   Hematological: Negative.   Psychiatric/Behavioral: Negative.        Objective:   Physical Exam  Constitutional: He is oriented to person, place, and time. He appears well-developed and well-nourished. No distress.  The patient is pleasant and alert and appears much younger than his stated age  HENT:  Head: Normocephalic and atraumatic.  Right Ear: External ear normal.  Left Ear: External ear normal.  Nose: Nose normal.  Mouth/Throat: Oropharynx is clear and moist. No oropharyngeal exudate.  Eyes: Conjunctivae and EOM are normal. Pupils are equal, round, and reactive to light. Right eye exhibits no discharge. Left eye exhibits no discharge. No scleral icterus.  Neck: Normal range of motion. Neck supple. No thyromegaly present.  The patient has a right supraclavicular bruit  Cardiovascular: Normal rate, regular rhythm and normal heart sounds.  Exam reveals no gallop and no friction rub.   No murmur heard. Pedal pulses are diminished and shortly the right greater than left, 60/m  Pulmonary/Chest: Effort normal and breath sounds normal. No respiratory distress. He has no wheezes. He has no rales. He exhibits no tenderness.  Lungs are clear anteriorly and posteriorly and there is no axillary adenopathy  Abdominal: Soft. Bowel sounds are normal. He exhibits no mass. There is no tenderness. There is no rebound and no guarding.  No abdominal bruits  Musculoskeletal: Normal range of motion. He exhibits no edema or tenderness.  Lymphadenopathy:    He has no cervical adenopathy.  Neurological: He is alert and oriented to person, place, and time. He has normal reflexes. No cranial nerve deficit.  Skin: Skin is warm and dry. No rash noted. No erythema.  No pallor.  Psychiatric: He has a normal mood and affect. His behavior is normal. Judgment and thought content normal.  Nursing note and vitals reviewed.  BP 137/82 mmHg  Pulse 59  Temp(Src) 96.9 F (36.1 C) (Oral)  Ht 5' 11.5" (1.816 m)  Wt 198 lb (89.812 kg)  BMI 27.23 kg/m2        Assessment & Plan:  1. Vitamin D deficiency -And 10 you current treatment pending results of lab work - POCT CBC - Vit D  25 hydroxy (rtn osteoporosis monitoring)  2. Type 2 diabetes mellitus without complication -The patient says his blood sugars at home are running in the 120s to 130s the majority of the time both fasting and during the day - POCT CBC - POCT glycosylated hemoglobin (Hb A1C)  3. BPH (benign prostatic hyperplasia) -Is having no symptoms with this and still has erectile dysfunction - POCT CBC  4. Essential hypertension -The blood pressure is good today he should continue with current treatment - POCT CBC - BMP8+EGFR - Hepatic function panel - benazepril (LOTENSIN)  10 MG tablet; Take 1 tablet (10 mg total) by mouth daily.  Dispense: 90 tablet; Refill: 3 - amLODipine (NORVASC) 5 MG tablet; Take 1 tablet (5 mg total) by mouth daily.  Dispense: 90 tablet; Refill: 3  5. Hyperlipidemia -Any with Crestor and aggressive therapeutic lifestyle changes which include diet and exercise - POCT CBC - NMR, lipoprofile  6. Erectile dysfunction due to arterial insufficiency -His Levitra prescription was refilled  7. Peripheral vascular insufficiency -She has no complaints with this but he does have diminished pulses in both lower extremities.  Meds ordered this encounter  Medications  . benazepril (LOTENSIN) 10 MG tablet    Sig: Take 1 tablet (10 mg total) by mouth daily.    Dispense:  90 tablet    Refill:  3  . amLODipine (NORVASC) 5 MG tablet    Sig: Take 1 tablet (5 mg total) by mouth daily.    Dispense:  90 tablet    Refill:  3  . albuterol (PROAIR HFA) 108 (90 BASE) MCG/ACT  inhaler    Sig: INHALE TWO PUFFS INTO LUNGS 4 TIMES DAILY AS NEEDED    Dispense:  54 each    Refill:  3  . niacin (NIASPAN) 1000 MG CR tablet    Sig: Take 1 tablet (1,000 mg total) by mouth at bedtime.    Dispense:  90 tablet    Refill:  3  . metoprolol (LOPRESSOR) 100 MG tablet    Sig: Take 1 tablet (100 mg total) by mouth 2 (two) times daily.    Dispense:  180 tablet    Refill:  3  . metFORMIN (GLUCOPHAGE-XR) 500 MG 24 hr tablet    Sig: Take 1 tablet (500 mg total) by mouth 2 (two) times daily.    Dispense:  180 tablet    Refill:  3  . hydrochlorothiazide (HYDRODIURIL) 25 MG tablet    Sig: TAKE ONE-HALF TABLET BY MOUTH ONCE DAILY EXCEPT  ON  MONDAY,  WEDNESDAY  AND  FRIDAY  TAKE  A  WHOLE  TABLET    Dispense:  90 tablet    Refill:  3  . dipyridamole-aspirin (AGGRENOX) 200-25 MG per 12 hr capsule    Sig: Take 1 capsule by mouth 2 (two) times daily.    Dispense:  180 capsule    Refill:  3  . rosuvastatin (CRESTOR) 10 MG tablet    Sig: Take 1 tablet (10 mg total) by mouth daily.    Dispense:  90 tablet    Refill:  3  . Fluticasone-Salmeterol (ADVAIR DISKUS) 100-50 MCG/DOSE AEPB    Sig: INHALE ONE DOSE BY MOUTH EVERY 12 HOURS AS NEEDED    Dispense:  180 each    Refill:  3  . vardenafil (LEVITRA) 20 MG tablet    Sig: Take 1 tablet (20 mg total) by mouth daily as needed for erectile dysfunction.    Dispense:  12 tablet    Refill:  0   Patient Instructions                       Medicare Annual Wellness Visit  [facility] and the medical providers at [facility] strive to bring you the best medical care.  In doing so we not only want to address your current medical conditions and concerns but also to detect new conditions early and prevent illness, disease and health-related problems.    Medicare offers a yearly Wellness Visit which allows our clinical  staff to assess your need for preventative services including immunizations, lifestyle education,  counseling to decrease risk of preventable diseases and screening for fall risk and other medical concerns.    This visit is provided free of charge (no copay) for all Medicare recipients. The clinical pharmacists at [facility] have begun to conduct these Wellness Visits which will also include a thorough review of all your medications.    As you primary medical provider recommend that you make an appointment for your Annual Wellness Visit if you have not done so already this year.  You may set up this appointment before you leave today or you may call back ([phone]) and schedule an appointment.  Please make sure when you call that you mention that you are scheduling your Annual Wellness Visit with the clinical pharmacist so that the appointment may be made for the proper length of time.     Continue current medications. Continue good therapeutic lifestyle changes which include good diet and exercise. Fall precautions discussed with patient. If an FOBT was given today- please return it to our front desk. If you are over 29 years old - you may need Prevnar 97 or the adult Pneumonia vaccine.  Flu Shots are still available at our office. If you still haven't had one please call to set up a nurse visit to get one.   After your visit with [place] today you will receive a survey in the mail or online from [employer] regarding your care with [place]. Please take a moment to fill this out. Your feedback is very important to [place] as you can help [place] better understand your patient needs as well as improve your experience and satisfaction. WE CARE ABOUT YOU!!!   The patient should constantly be aware of climbing and trying to prevent injuries from falls He should continue with his follow-up with his ophthalmologist, his cardiologist This summer drink plenty of fluids Continue to monitor blood sugars regularly and feet regularly      8. Arthralgia -Can use to have his aches and pains but  is used to this and has no specific need for medication at this time.  9. Aneurysm of iliac artery -At the present time he is having no problems with this.  10. Peripheral vascular disease -He has had recent Dopplers to further evaluate this and no emergent need was found to correct any problems associated with this.  Arrie Senate MD

## 2015-02-09 LAB — BMP8+EGFR
BUN / CREAT RATIO: 12 (ref 10–22)
BUN: 15 mg/dL (ref 8–27)
CHLORIDE: 97 mmol/L (ref 97–108)
CO2: 23 mmol/L (ref 18–29)
Calcium: 9.1 mg/dL (ref 8.6–10.2)
Creatinine, Ser: 1.3 mg/dL — ABNORMAL HIGH (ref 0.76–1.27)
GFR calc Af Amer: 59 mL/min/{1.73_m2} — ABNORMAL LOW (ref >59)
GFR, EST NON AFRICAN AMERICAN: 51 mL/min/{1.73_m2} — AB (ref >59)
GLUCOSE: 166 mg/dL — AB (ref 65–99)
Potassium: 4.1 mmol/L (ref 3.5–5.2)
Sodium: 137 mmol/L (ref 134–144)

## 2015-02-09 LAB — NMR, LIPOPROFILE
Cholesterol: 103 mg/dL (ref 100–199)
HDL Cholesterol by NMR: 41 mg/dL (ref >39)
HDL Particle Number: 31.2 umol/L (ref >=30.5)
LDL Particle Number: 495 nmol/L (ref <1000)
LDL SIZE: 20.5 nm (ref >20.5)
LDL-C: 37 mg/dL (ref 0–99)
LP-IR Score: 73 — ABNORMAL HIGH (ref <=45)
SMALL LDL PARTICLE NUMBER: 162 nmol/L (ref <=527)
Triglycerides by NMR: 123 mg/dL (ref 0–149)

## 2015-02-09 LAB — HEPATIC FUNCTION PANEL
ALK PHOS: 78 IU/L (ref 39–117)
ALT: 28 IU/L (ref 0–44)
AST: 20 IU/L (ref 0–40)
Albumin: 4.4 g/dL (ref 3.5–4.7)
BILIRUBIN, DIRECT: 0.2 mg/dL (ref 0.00–0.40)
Bilirubin Total: 0.9 mg/dL (ref 0.0–1.2)
TOTAL PROTEIN: 6.7 g/dL (ref 6.0–8.5)

## 2015-02-09 LAB — VITAMIN D 25 HYDROXY (VIT D DEFICIENCY, FRACTURES): VIT D 25 HYDROXY: 41.2 ng/mL (ref 30.0–100.0)

## 2015-02-11 ENCOUNTER — Other Ambulatory Visit: Payer: Medicare Other

## 2015-02-11 DIAGNOSIS — Z1212 Encounter for screening for malignant neoplasm of rectum: Secondary | ICD-10-CM

## 2015-02-11 NOTE — Progress Notes (Signed)
Lab only 

## 2015-02-12 ENCOUNTER — Telehealth: Payer: Self-pay | Admitting: *Deleted

## 2015-02-12 NOTE — Telephone Encounter (Signed)
-----   Message from Chipper Herb, MD sent at 02/09/2015 10:01 AM EDT ----- The blood sugar is elevated at 166. The creatinine, the most important kidney function test is also slightly elevated at 1.30. The patient should avoid all NSAIDs, like ibuprofen and Aleve, and should only take Tylenol if needed for pain. The electrolytes including potassium are within normal limits. All liver function tests are within normal limits All cholesterol numbers with advanced lipid testing are excellent and at goal the patient should continue with his current treatment and aggressive therapeutic lifestyle changes which include diet and exercise The vitamin D level is good and within normal limits and the patient should continue with current treatment

## 2015-02-13 LAB — FECAL OCCULT BLOOD, IMMUNOCHEMICAL: FECAL OCCULT BLD: NEGATIVE

## 2015-02-13 NOTE — Progress Notes (Signed)
Patient aware.

## 2015-02-14 DIAGNOSIS — J342 Deviated nasal septum: Secondary | ICD-10-CM | POA: Insufficient documentation

## 2015-02-25 ENCOUNTER — Encounter: Payer: Self-pay | Admitting: Family Medicine

## 2015-03-19 ENCOUNTER — Other Ambulatory Visit: Payer: Self-pay | Admitting: Dermatology

## 2015-03-21 LAB — HM DIABETES EYE EXAM

## 2015-03-27 ENCOUNTER — Encounter: Payer: Self-pay | Admitting: *Deleted

## 2015-04-22 ENCOUNTER — Ambulatory Visit (INDEPENDENT_AMBULATORY_CARE_PROVIDER_SITE_OTHER): Payer: Medicare Other | Admitting: Family Medicine

## 2015-04-22 ENCOUNTER — Encounter: Payer: Self-pay | Admitting: Family Medicine

## 2015-04-22 VITALS — BP 135/80 | HR 68 | Temp 97.0°F | Ht 71.5 in | Wt 196.4 lb

## 2015-04-22 DIAGNOSIS — Z0000 Encounter for general adult medical examination without abnormal findings: Secondary | ICD-10-CM | POA: Diagnosis not present

## 2015-04-22 DIAGNOSIS — Z23 Encounter for immunization: Secondary | ICD-10-CM | POA: Diagnosis not present

## 2015-04-22 NOTE — Patient Instructions (Signed)
Great to meet you!  Come back in about 1 month to follow up with diabetes  Consider claritin for your allergies

## 2015-04-22 NOTE — Progress Notes (Signed)
Subjective:    [name] is a 81 y.o. male who presents for Medicare Annual/Subsequent preventive examination.   Preventive Screening-Counseling & Management  Tobacco History  Smoking status  . Former Smoker  . Quit date: 11/24/1984  Smokeless tobacco  . Not on file    Problems Prior to Visit 1. See below  Current Problems (verified) Patient Active Problem List   Diagnosis Date Noted  . History of TIA (transient ischemic attack) 09/12/2013  . Metabolic syndrome 52/84/1324  . Vitamin D deficiency 08/01/2013  . Diabetes 08/01/2013  . PVD (peripheral vascular disease) 11/25/2011  . ILIAC ARTERY ANEURYSM 07/17/2010  . DYSLIPIDEMIA 08/28/2009  . CORONARY ATHEROSCLEROSIS NATIVE CORONARY ARTERY 08/28/2009  . UPPER RESPIRATORY INFECTION 08/28/2009  . HYPERTENSION 06/05/2009  . CEREBRAL ATHEROSCLEROSIS 06/04/2009    Medications Prior to Visit Current Outpatient Prescriptions on File Prior to Visit  Medication Sig Dispense Refill  . albuterol (PROAIR HFA) 108 (90 BASE) MCG/ACT inhaler INHALE TWO PUFFS INTO LUNGS 4 TIMES DAILY AS NEEDED 54 each 3  . amLODipine (NORVASC) 5 MG tablet Take 1 tablet (5 mg total) by mouth daily. 90 tablet 3  . benazepril (LOTENSIN) 10 MG tablet Take 1 tablet (10 mg total) by mouth daily. 90 tablet 3  . dipyridamole-aspirin (AGGRENOX) 200-25 MG per 12 hr capsule Take 1 capsule by mouth 2 (two) times daily. 180 capsule 3  . fish oil-omega-3 fatty acids 1000 MG capsule Take 2 g by mouth daily.      . Fluticasone-Salmeterol (ADVAIR DISKUS) 100-50 MCG/DOSE AEPB INHALE ONE DOSE BY MOUTH EVERY 12 HOURS AS NEEDED 180 each 3  . hydrochlorothiazide (HYDRODIURIL) 25 MG tablet TAKE ONE-HALF TABLET BY MOUTH ONCE DAILY EXCEPT  ON  MONDAY,  WEDNESDAY  AND  FRIDAY  TAKE  A  WHOLE  TABLET 90 tablet 3  . ibuprofen (ADVIL,MOTRIN) 200 MG tablet Take 200 mg by mouth daily.    . meclizine (ANTIVERT) 32 MG tablet Take 1 tablet (32 mg total) by mouth 4 (four) times daily  as needed. 40 tablet 6  . meloxicam (MOBIC) 15 MG tablet Take 1 tablet (15 mg total) by mouth daily. 30 tablet 2  . metFORMIN (GLUCOPHAGE-XR) 500 MG 24 hr tablet Take 1 tablet (500 mg total) by mouth 2 (two) times daily. 180 tablet 3  . metoprolol (LOPRESSOR) 100 MG tablet Take 1 tablet (100 mg total) by mouth 2 (two) times daily. 180 tablet 3  . niacin (NIASPAN) 1000 MG CR tablet Take 1 tablet (1,000 mg total) by mouth at bedtime. 90 tablet 3  . NITROSTAT 0.4 MG SL tablet USE AS DIRECTED 25 tablet 3  . rosuvastatin (CRESTOR) 10 MG tablet Take 1 tablet (10 mg total) by mouth daily. 90 tablet 3  . Vitamin D, Ergocalciferol, (DRISDOL) 50000 UNITS CAPS capsule Take 1 capsule (50,000 Units total) by mouth every 7 (seven) days. 12 capsule 0   No current facility-administered medications on file prior to visit.    Current Medications (verified) Current Outpatient Prescriptions  Medication Sig Dispense Refill  . albuterol (PROAIR HFA) 108 (90 BASE) MCG/ACT inhaler INHALE TWO PUFFS INTO LUNGS 4 TIMES DAILY AS NEEDED 54 each 3  . amLODipine (NORVASC) 5 MG tablet Take 1 tablet (5 mg total) by mouth daily. 90 tablet 3  . benazepril (LOTENSIN) 10 MG tablet Take 1 tablet (10 mg total) by mouth daily. 90 tablet 3  . dipyridamole-aspirin (AGGRENOX) 200-25 MG per 12 hr capsule Take 1 capsule by mouth 2 (two)  times daily. 180 capsule 3  . fish oil-omega-3 fatty acids 1000 MG capsule Take 2 g by mouth daily.      . Fluticasone-Salmeterol (ADVAIR DISKUS) 100-50 MCG/DOSE AEPB INHALE ONE DOSE BY MOUTH EVERY 12 HOURS AS NEEDED 180 each 3  . hydrochlorothiazide (HYDRODIURIL) 25 MG tablet TAKE ONE-HALF TABLET BY MOUTH ONCE DAILY EXCEPT  ON  MONDAY,  WEDNESDAY  AND  FRIDAY  TAKE  A  WHOLE  TABLET 90 tablet 3  . ibuprofen (ADVIL,MOTRIN) 200 MG tablet Take 200 mg by mouth daily.    . meclizine (ANTIVERT) 32 MG tablet Take 1 tablet (32 mg total) by mouth 4 (four) times daily as needed. 40 tablet 6  . meloxicam (MOBIC)  15 MG tablet Take 1 tablet (15 mg total) by mouth daily. 30 tablet 2  . metFORMIN (GLUCOPHAGE-XR) 500 MG 24 hr tablet Take 1 tablet (500 mg total) by mouth 2 (two) times daily. 180 tablet 3  . metoprolol (LOPRESSOR) 100 MG tablet Take 1 tablet (100 mg total) by mouth 2 (two) times daily. 180 tablet 3  . niacin (NIASPAN) 1000 MG CR tablet Take 1 tablet (1,000 mg total) by mouth at bedtime. 90 tablet 3  . NITROSTAT 0.4 MG SL tablet USE AS DIRECTED 25 tablet 3  . rosuvastatin (CRESTOR) 10 MG tablet Take 1 tablet (10 mg total) by mouth daily. 90 tablet 3  . Vitamin D, Ergocalciferol, (DRISDOL) 50000 UNITS CAPS capsule Take 1 capsule (50,000 Units total) by mouth every 7 (seven) days. 12 capsule 0   No current facility-administered medications for this visit.     Allergies (verified) Codeine; Lopid; and Voltaren   PAST HISTORY  Family History Family History  Problem Relation Age of Onset  . Coronary artery disease Mother   . Cancer Father     lung  . Cancer Brother     lung    Social History Social History  Substance Use Topics  . Smoking status: Former Smoker    Quit date: 11/24/1984  . Smokeless tobacco: Not on file  . Alcohol Use: No    Are there smokers in your home (other than you)?  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  Depression Screen (Note: if answer to either of the following is "Yes", a more complete depression screening is indicated)   Q1: Over the past two weeks, have you felt down, depressed or hopeless? No  Q2: Over the past two weeks, have you felt little interest or pleasure in doing things? No  Have you lost interest or pleasure in daily life? No  Do you often feel hopeless? No  Do you cry easily over simple problems?  No  Activities of Daily Living In your present state of health, do you have any difficulty performing the following activities?:  Driving? No Managing money?  No Feeding yourself? No Getting from bed to chair? No Climbing a flight of stairs? No Preparing food and eating?: No Bathing or showering? No Getting dressed: No Getting to the toilet? No Using the toilet:No Moving around from place to place: No In the past year have you fallen or had a near fall?:No   Are you sexually active?  No  Do you have more than one partner?  No  Hearing Difficulties: No  Do you often ask people to speak up or repeat themselves? No Do you experience ringing or noises in your ears? No Do you have difficulty understanding soft or whispered voices? No   Do you feel that you have a problem with memory? No  Do you often misplace items? No  Do you feel safe at home?  Yes  Cognitive Testing  Alert? Yes  Normal Appearance?Yes  Oriented to person? Yes  Place? Yes   Time? Yes  Recall of three objects?  Yes  Can perform simple calculations? Yes  Displays appropriate judgment?Yes  Can read the correct time from a watch face?Yes   Advanced Directives have been discussed with the patient? Yes   List the Names of Other Physician/Practitioners you currently use: 1.    Indicate any recent Medical Services you may have received from other than [facility] providers in the past year (date may be approximate).  Immunization History  Administered Date(s) Administered  . Influenza,inj,Quad PF,36+ Mos 08/15/2013, 05/02/2014  . Pneumococcal Conjugate-13 08/15/2013  . Zoster 09/13/2013    Screening Tests Health Maintenance  Topic Date Due  . INFLUENZA VACCINE  03/04/2015  . PNA vac Low Risk Adult (2 of 2 - PPSV23) 08/11/2015 (Originally 08/15/2014)  . HEMOGLOBIN A1C  08/11/2015  . URINE MICROALBUMIN  10/01/2015  . FOOT EXAM  02/08/2016  . OPHTHALMOLOGY EXAM  03/20/2016  . TETANUS/TDAP  04/12/2021  .  COLONOSCOPY  02/01/2023  . ZOSTAVAX  Completed    All answers were reviewed with the patient and necessary referrals were made:  Kenn File, MD   04/22/2015   History reviewed: allergies, current medications, past family history, past medical history, past social history, past surgical history and problem list  Review of Systems Pertinent items are noted in HPI.    Objective:      Blood pressure 135/80, pulse 68, temperature 97 F (36.1 C), temperature source Oral, height 5' 11.5" (1.816 m), weight 196 lb 6.4 oz (89.086 kg). Body mass index is 27.01 kg/(m^2).  Gen: NAD, alert, cooperative with exam HEENT: NCAT, EOMI, PERRL CV: RRR, good S1/S2, no murmur Resp: CTABL, no wheezes, non-labored Ext: No edema, warm Neuro: Alert and oriented, No gross deficits       Assessment:     Mr. Arlen Dupuis is a pleasant 81 year old male here for his Medicare annual wellness visit. He is doing well and received a 26 out of 30 on his MMSE today. He has a known iliac artery aneurysm thats being monitored by cardiology.  Currently he states that he's having mild cough due to increased ragweed which is easily controlled with pro air.   We discussed advanced directives and Pneumovax.      Plan:     During the course of the visit the patient was educated and counseled about appropriate screening and preventive services including:    Pneumococcal vaccine   Advanced directives discussed  Diet review for nutrition referral? No, offered with loss of appetite but patient declined   Patient Instructions (the written plan) was given to the patient.  Medicare Attestation I have personally reviewed: The patient's medical and social history Their use of alcohol, tobacco or illicit drugs Their current medications and supplements The patient's functional ability including ADLs,fall risks, home safety risks, cognitive, and hearing and visual impairment Diet and physical  activities Evidence for depression or mood disorders  The patient's weight, height, BMI, and visual acuity have been recorded in the chart.  I have made referrals, counseling,  and provided education to the patient based on review of the above and I have provided the patient with a written personalized care plan for preventive services.     Kenn File, MD   04/22/2015

## 2015-05-13 ENCOUNTER — Other Ambulatory Visit: Payer: Self-pay | Admitting: Family Medicine

## 2015-05-17 ENCOUNTER — Telehealth: Payer: Self-pay | Admitting: Family Medicine

## 2015-05-17 NOTE — Telephone Encounter (Signed)
Pt's wife aware of appointment date/time  05/22/15 at 1:30

## 2015-05-22 ENCOUNTER — Ambulatory Visit (INDEPENDENT_AMBULATORY_CARE_PROVIDER_SITE_OTHER): Payer: Medicare Other | Admitting: Cardiology

## 2015-05-22 ENCOUNTER — Ambulatory Visit: Payer: Medicare Other | Admitting: Cardiology

## 2015-05-22 ENCOUNTER — Encounter: Payer: Self-pay | Admitting: Cardiology

## 2015-05-22 VITALS — BP 144/81 | HR 74 | Ht 71.0 in | Wt 197.0 lb

## 2015-05-22 DIAGNOSIS — I10 Essential (primary) hypertension: Secondary | ICD-10-CM

## 2015-05-22 NOTE — Patient Instructions (Signed)
Medication Instructions:  The current medical regimen is effective;  continue present plan and medications.  Follow-Up: Follow up in 1 year with Dr. Hochrein.  You will receive a letter in the mail 2 months before you are due.  Please call us when you receive this letter to schedule your follow up appointment.  Thank you for choosing [facility]!!      

## 2015-05-22 NOTE — Progress Notes (Signed)
HPI The patient presents for followup of his known coronary disease. Since I last saw him he has done well.  The patient denies any new symptoms such as chest discomfort, neck or arm discomfort. There has been no new shortness of breath, PND or orthopnea. There have been no reported palpitations, presyncope or syncope.  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     Allergies  Allergen Reactions  . Codeine Other (See Comments)    Bad dreams  . Lopid [Gemfibrozil] Nausea Only  . Voltaren [Diclofenac Sodium] Diarrhea    Current Outpatient Prescriptions  Medication Sig Dispense Refill  . albuterol (PROAIR HFA) 108 (90 BASE) MCG/ACT inhaler INHALE TWO PUFFS INTO LUNGS 4 TIMES DAILY AS NEEDED 54 each 3  . amLODipine (NORVASC) 5 MG tablet Take 1 tablet (5 mg total) by mouth daily. 90 tablet 3  . benazepril (LOTENSIN) 10 MG tablet Take 1 tablet (10 mg total) by mouth daily. 90 tablet 3  . dipyridamole-aspirin (AGGRENOX) 200-25 MG per 12 hr capsule Take 1 capsule by mouth 2 (two) times daily. 180 capsule 3  . fish oil-omega-3 fatty acids 1000 MG capsule Take 2 g by mouth daily.      . Fluticasone-Salmeterol (ADVAIR DISKUS) 100-50 MCG/DOSE AEPB INHALE ONE DOSE BY MOUTH EVERY 12 HOURS AS NEEDED 180 each 3  . hydrochlorothiazide (HYDRODIURIL) 25 MG tablet TAKE ONE-HALF TABLET BY MOUTH ONCE DAILY EXCEPT  ON  MONDAY,  WEDNESDAY  AND  FRIDAY  TAKE  A  WHOLE  TABLET 90 tablet 3  . ibuprofen (ADVIL,MOTRIN) 200 MG tablet Take 200 mg by mouth daily.    . meclizine (ANTIVERT) 32 MG tablet Take 1 tablet (32 mg total) by mouth 4 (four) times daily as needed. 40 tablet 6  . meloxicam (MOBIC) 15 MG tablet TAKE 1 TABLET DAILY 30 tablet 0  . metFORMIN (GLUCOPHAGE-XR) 500 MG 24 hr tablet Take 1 tablet (500 mg total) by mouth 2 (two) times daily. 180 tablet 3  . metoprolol (LOPRESSOR) 100 MG tablet Take 1 tablet (100 mg total) by mouth 2 (two) times daily. 180 tablet 3  . niacin (NIASPAN) 1000 MG CR tablet Take 1  tablet (1,000 mg total) by mouth at bedtime. 90 tablet 3  . NITROSTAT 0.4 MG SL tablet USE AS DIRECTED 25 tablet 3  . rosuvastatin (CRESTOR) 10 MG tablet Take 1 tablet (10 mg total) by mouth daily. 90 tablet 3  . Vitamin D, Ergocalciferol, (DRISDOL) 50000 UNITS CAPS capsule Take 1 capsule (50,000 Units total) by mouth every 7 (seven) days. 12 capsule 0   No current facility-administered medications for this visit.    Past Medical History  Diagnosis Date  . DYSLIPIDEMIA   . HYPERTENSION   . CORONARY ATHEROSCLEROSIS NATIVE CORONARY ARTERY     Myocardial infarction at [facility] in 1996 with a directional atherectomy of the 90% LAD stenosis and medical management of 60-70% proximal circumflex and 50% RCA stenosis.  . Cerebral atherosclerosis   . UPPER RESPIRATORY INFECTION   . ILIAC ARTERY ANEURYSM   . Diabetes mellitus without complication ([place])   . Arthritis   . Asthma   . TIA (transient ischemic attack)     Past Surgical History  Procedure Laterality Date  . Intraocular lens insertion    . Right knee surgery    . Repair of rectal fistula      ROS: As stated in the HPI and negative for all other systems.  PHYSICAL EXAM BP 144/81 mmHg  Pulse 74  Ht 5\' 11"  (1.803 m)  Wt 197 lb (89.359 kg)  BMI 27.49 kg/m2  SpO2 95% GENERAL:  Well appearing NECK:  No jugular venous distention, waveform within normal limits, carotid upstroke brisk and symmetric,soft  right bruit, no thyromegaly LUNGS:  Clear to auscultation bilaterally BACK:  No CVA tenderness CHEST: Unremarkable HEART:  PMI not displaced or sustained,S1 and S2 within normal limits, no S3, no S4, no clicks, no rubs, soft apical early peaking systolic murmur ABD:  Flat, positive bowel sounds normal in frequency in pitch, no bruits, no rebound, no guarding, no midline pulsatile mass, no hepatomegaly, no splenomegaly EXT:  2 plus pulses throughout, no edema, no cyanosis no clubbing   EKG:  Sinus rhythm, rate 72, axis within  normal limits, intervals within normal, no acute ST-T wave changes.  05/22/2015   ASSESSMENT AND PLAN  CAD:  The patient has no new sypmtoms since stress testing in 2012.  No further cardiovascular testing is indicated.  We will continue with aggressive risk reduction and meds as listed.    HTN:  The blood pressure is mildly elevated.  However he says that this does not happen at home or in frequently in the office. No change in medications is indicated. We will continue with therapeutic lifestyle changes (TLC).  HYPERLIPIDEMIA:   Per Redge Gainer, MD.  However, current guidelines do not suggest niacin plus a statin in the absence of some other compelling indication such as very difficult to control hypertriglyceridemia. I would suggest discontinuing the niacin.  CAROTID STENOSIS:  He had mild plaquing 2015. I will repeat this in 2017  DM:  He will continue on the meds as listed. His A1c was 6.6.  Lab Results  Component Value Date   HGBA1C 6.6 02/08/2015

## 2015-05-23 ENCOUNTER — Ambulatory Visit (INDEPENDENT_AMBULATORY_CARE_PROVIDER_SITE_OTHER): Payer: Medicare Other | Admitting: Family Medicine

## 2015-05-23 ENCOUNTER — Encounter: Payer: Self-pay | Admitting: Family Medicine

## 2015-05-23 VITALS — BP 157/86 | HR 60 | Temp 97.1°F | Ht 71.0 in | Wt 197.2 lb

## 2015-05-23 DIAGNOSIS — I10 Essential (primary) hypertension: Secondary | ICD-10-CM

## 2015-05-23 DIAGNOSIS — Z23 Encounter for immunization: Secondary | ICD-10-CM | POA: Diagnosis not present

## 2015-05-23 DIAGNOSIS — E118 Type 2 diabetes mellitus with unspecified complications: Secondary | ICD-10-CM

## 2015-05-23 LAB — POCT GLYCOSYLATED HEMOGLOBIN (HGB A1C): Hemoglobin A1C: 6.2

## 2015-05-23 MED ORDER — BENAZEPRIL HCL 20 MG PO TABS: 20.0000 mg | ORAL_TABLET | Freq: Every day | ORAL | Status: DC

## 2015-05-23 NOTE — Progress Notes (Signed)
   HPI  Patient presents today here for follow-up diabetes and hypertension.  Diabetes Goodman compliance Fasting blood sugars elevated 150-180, come down to around 110 around lunchtime. 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  Hypertension No chest pain, dyspnea, palpitations, leg edema. Taking meds regularly, he does cut his HCTZ in half on Monday Wednesday and Friday Blood pressure ranging at home from 254-270 systolic  Hyperlipidemia Good med compliance  PMH: Smoking status noted ROS: Per HPI  Objective: BP 157/86 mmHg  Pulse 60  Temp(Src) 97.1 F (36.2 C) (Oral)  Ht 5\' 11"  (1.803 m)  Wt 197 lb 3.2 oz (89.449 kg)  BMI 27.52 kg/m2 Gen: NAD, alert, cooperative with exam HEENT: NCAT CV: RRR, good S1/S2, no murmur Resp: CTABL, no wheezes, non-labored Ext: No edema, warm Neuro: Alert and oriented, No gross deficits  Assessment and plan:  # Dementia Slightly elevated today, he reports that he frequently in the 623J systolic at home Increase benazepril to 20 mg Continue HCTZ at current dose  # Hyperlipidemia Continue Crestor, stop niacin  # Diabetes A1c 6.2 Continue metformin twice a day   Orders Placed This Encounter  Procedures  . Flu Vaccine QUAD 36+ mos IM  . POCT glycosylated hemoglobin (Hb A1C)    Meds ordered this encounter  Medications  . benazepril (LOTENSIN) 20 MG tablet    Sig: Take 1 tablet (20 mg total) by mouth daily.    Dispense:  90 tablet    Refill:  [place], MD [facility] 05/23/2015, 9:37 AM

## 2015-05-23 NOTE — Patient Instructions (Addendum)
Great to see you!  Come back in 3 months  You are doing great on your diabetes, keep up the good work!  Stop niacin

## 2015-07-08 ENCOUNTER — Ambulatory Visit: Payer: Medicare Other | Admitting: Family Medicine

## 2015-07-09 ENCOUNTER — Other Ambulatory Visit: Payer: Self-pay | Admitting: Family Medicine

## 2015-07-15 ENCOUNTER — Encounter (HOSPITAL_COMMUNITY): Payer: Self-pay | Admitting: *Deleted

## 2015-07-15 ENCOUNTER — Ambulatory Visit (INDEPENDENT_AMBULATORY_CARE_PROVIDER_SITE_OTHER): Payer: Medicare Other | Admitting: Family Medicine

## 2015-07-15 ENCOUNTER — Other Ambulatory Visit (HOSPITAL_COMMUNITY): Payer: Medicare Other

## 2015-07-15 ENCOUNTER — Inpatient Hospital Stay (HOSPITAL_COMMUNITY)
Admission: EM | Admit: 2015-07-15 | Discharge: 2015-07-17 | DRG: 062 | Disposition: A | Discharge status: Home / Self Care | Payer: Medicare Other | Attending: Neurology | Admitting: Neurology

## 2015-07-15 ENCOUNTER — Emergency Department (HOSPITAL_COMMUNITY): Payer: Medicare Other

## 2015-07-15 VITALS — BP 183/85 | HR 73 | Temp 96.9°F | Ht 71.0 in

## 2015-07-15 DIAGNOSIS — Z9282 Status post administration of tPA (rtPA) in a different facility within the last 24 hours prior to admission to current facility: Secondary | ICD-10-CM

## 2015-07-15 DIAGNOSIS — I2510 Atherosclerotic heart disease of native coronary artery without angina pectoris: Secondary | ICD-10-CM | POA: Diagnosis present

## 2015-07-15 DIAGNOSIS — I25110 Atherosclerotic heart disease of native coronary artery with unstable angina pectoris: Secondary | ICD-10-CM | POA: Diagnosis present

## 2015-07-15 DIAGNOSIS — Z87891 Personal history of nicotine dependence: Secondary | ICD-10-CM | POA: Diagnosis not present

## 2015-07-15 DIAGNOSIS — N189 Chronic kidney disease, unspecified: Secondary | ICD-10-CM | POA: Diagnosis present

## 2015-07-15 DIAGNOSIS — E1159 Type 2 diabetes mellitus with other circulatory complications: Secondary | ICD-10-CM | POA: Diagnosis present

## 2015-07-15 DIAGNOSIS — Z886 Allergy status to analgesic agent status: Secondary | ICD-10-CM | POA: Diagnosis not present

## 2015-07-15 DIAGNOSIS — I252 Old myocardial infarction: Secondary | ICD-10-CM | POA: Diagnosis not present

## 2015-07-15 DIAGNOSIS — R4701 Aphasia: Secondary | ICD-10-CM | POA: Diagnosis present

## 2015-07-15 DIAGNOSIS — Z885 Allergy status to narcotic agent status: Secondary | ICD-10-CM | POA: Diagnosis not present

## 2015-07-15 DIAGNOSIS — G8191 Hemiplegia, unspecified affecting right dominant side: Secondary | ICD-10-CM | POA: Diagnosis present

## 2015-07-15 DIAGNOSIS — I6789 Other cerebrovascular disease: Secondary | ICD-10-CM | POA: Diagnosis not present

## 2015-07-15 DIAGNOSIS — Z888 Allergy status to other drugs, medicaments and biological substances status: Secondary | ICD-10-CM

## 2015-07-15 DIAGNOSIS — E1122 Type 2 diabetes mellitus with diabetic chronic kidney disease: Secondary | ICD-10-CM | POA: Diagnosis present

## 2015-07-15 DIAGNOSIS — N183 Chronic kidney disease, stage 3 unspecified: Secondary | ICD-10-CM | POA: Diagnosis present

## 2015-07-15 DIAGNOSIS — I639 Cerebral infarction, unspecified: Secondary | ICD-10-CM | POA: Diagnosis present

## 2015-07-15 DIAGNOSIS — R4781 Slurred speech: Secondary | ICD-10-CM

## 2015-07-15 DIAGNOSIS — Z791 Long term (current) use of non-steroidal anti-inflammatories (NSAID): Secondary | ICD-10-CM

## 2015-07-15 DIAGNOSIS — R4702 Dysphasia: Secondary | ICD-10-CM | POA: Diagnosis present

## 2015-07-15 DIAGNOSIS — R200 Anesthesia of skin: Secondary | ICD-10-CM | POA: Diagnosis not present

## 2015-07-15 DIAGNOSIS — M1990 Unspecified osteoarthritis, unspecified site: Secondary | ICD-10-CM | POA: Diagnosis present

## 2015-07-15 DIAGNOSIS — J45909 Unspecified asthma, uncomplicated: Secondary | ICD-10-CM | POA: Diagnosis present

## 2015-07-15 DIAGNOSIS — R479 Unspecified speech disturbances: Secondary | ICD-10-CM | POA: Diagnosis not present

## 2015-07-15 DIAGNOSIS — G459 Transient cerebral ischemic attack, unspecified: Secondary | ICD-10-CM

## 2015-07-15 DIAGNOSIS — Z7984 Long term (current) use of oral hypoglycemic drugs: Secondary | ICD-10-CM | POA: Diagnosis not present

## 2015-07-15 DIAGNOSIS — I129 Hypertensive chronic kidney disease with stage 1 through stage 4 chronic kidney disease, or unspecified chronic kidney disease: Secondary | ICD-10-CM | POA: Diagnosis present

## 2015-07-15 DIAGNOSIS — I10 Essential (primary) hypertension: Secondary | ICD-10-CM | POA: Diagnosis present

## 2015-07-15 DIAGNOSIS — I739 Peripheral vascular disease, unspecified: Secondary | ICD-10-CM | POA: Diagnosis present

## 2015-07-15 DIAGNOSIS — E1169 Type 2 diabetes mellitus with other specified complication: Secondary | ICD-10-CM | POA: Diagnosis present

## 2015-07-15 DIAGNOSIS — I672 Cerebral atherosclerosis: Secondary | ICD-10-CM | POA: Diagnosis present

## 2015-07-15 DIAGNOSIS — E1151 Type 2 diabetes mellitus with diabetic peripheral angiopathy without gangrene: Secondary | ICD-10-CM | POA: Diagnosis present

## 2015-07-15 DIAGNOSIS — Z8673 Personal history of transient ischemic attack (TIA), and cerebral infarction without residual deficits: Secondary | ICD-10-CM | POA: Diagnosis not present

## 2015-07-15 DIAGNOSIS — I723 Aneurysm of iliac artery: Secondary | ICD-10-CM | POA: Diagnosis present

## 2015-07-15 DIAGNOSIS — E785 Hyperlipidemia, unspecified: Secondary | ICD-10-CM | POA: Diagnosis present

## 2015-07-15 DIAGNOSIS — M6289 Other specified disorders of muscle: Secondary | ICD-10-CM | POA: Diagnosis not present

## 2015-07-15 DIAGNOSIS — Z79899 Other long term (current) drug therapy: Secondary | ICD-10-CM

## 2015-07-15 DIAGNOSIS — R29702 NIHSS score 2: Secondary | ICD-10-CM | POA: Diagnosis present

## 2015-07-15 DIAGNOSIS — R531 Weakness: Secondary | ICD-10-CM | POA: Diagnosis present

## 2015-07-15 DIAGNOSIS — E119 Type 2 diabetes mellitus without complications: Secondary | ICD-10-CM

## 2015-07-15 HISTORY — DX: Acute myocardial infarction, unspecified: I21.9

## 2015-07-15 LAB — DIFFERENTIAL
BASOS ABS: 0 10*3/uL (ref 0.0–0.1)
Basophils Relative: 0 %
Eosinophils Absolute: 0.5 10*3/uL (ref 0.0–0.7)
Eosinophils Relative: 5 %
LYMPHS ABS: 2.3 10*3/uL (ref 0.7–4.0)
LYMPHS PCT: 24 %
Monocytes Absolute: 0.8 10*3/uL (ref 0.1–1.0)
Monocytes Relative: 9 %
NEUTROS PCT: 62 %
Neutro Abs: 6.1 10*3/uL (ref 1.7–7.7)

## 2015-07-15 LAB — CBC
HCT: 43.8 % (ref 39.0–52.0)
HEMOGLOBIN: 15.6 g/dL (ref 13.0–17.0)
MCH: 34.2 pg — AB (ref 26.0–34.0)
MCHC: 35.6 g/dL (ref 30.0–36.0)
MCV: 96.1 fL (ref 78.0–100.0)
Platelets: 167 10*3/uL (ref 150–400)
RBC: 4.56 MIL/uL (ref 4.22–5.81)
RDW: 12.3 % (ref 11.5–15.5)
WBC: 9.7 10*3/uL (ref 4.0–10.5)

## 2015-07-15 LAB — I-STAT CHEM 8, ED
BUN: 18 mg/dL (ref 6–20)
CALCIUM ION: 1.09 mmol/L — AB (ref 1.13–1.30)
CREATININE: 1.3 mg/dL — AB (ref 0.61–1.24)
Chloride: 94 mmol/L — ABNORMAL LOW (ref 101–111)
Glucose, Bld: 178 mg/dL — ABNORMAL HIGH (ref 65–99)
HCT: 49 % (ref 39.0–52.0)
Hemoglobin: 16.7 g/dL (ref 13.0–17.0)
Potassium: 3.9 mmol/L (ref 3.5–5.1)
Sodium: 136 mmol/L (ref 135–145)
TCO2: 26 mmol/L (ref 0–100)

## 2015-07-15 LAB — COMPREHENSIVE METABOLIC PANEL
ALBUMIN: 4 g/dL (ref 3.5–5.0)
ALK PHOS: 82 U/L (ref 38–126)
ALT: 22 U/L (ref 17–63)
AST: 24 U/L (ref 15–41)
Anion gap: 10 (ref 5–15)
BILIRUBIN TOTAL: 1.1 mg/dL (ref 0.3–1.2)
BUN: 15 mg/dL (ref 6–20)
CALCIUM: 9.2 mg/dL (ref 8.9–10.3)
CO2: 26 mmol/L (ref 22–32)
CREATININE: 1.39 mg/dL — AB (ref 0.61–1.24)
Chloride: 98 mmol/L — ABNORMAL LOW (ref 101–111)
GFR calc Af Amer: 52 mL/min — ABNORMAL LOW (ref >60)
GFR calc non Af Amer: 45 mL/min — ABNORMAL LOW (ref >60)
GLUCOSE: 183 mg/dL — AB (ref 65–99)
Potassium: 4.1 mmol/L (ref 3.5–5.1)
Sodium: 134 mmol/L — ABNORMAL LOW (ref 135–145)
TOTAL PROTEIN: 7.2 g/dL (ref 6.5–8.1)

## 2015-07-15 LAB — PROTIME-INR
INR: 1.04 (ref 0.00–1.49)
Prothrombin Time: 13.8 seconds (ref 11.6–15.2)

## 2015-07-15 LAB — APTT: APTT: 28 s (ref 24–37)

## 2015-07-15 LAB — I-STAT TROPONIN, ED: Troponin i, poc: 0 ng/mL (ref 0.00–0.08)

## 2015-07-15 MED ORDER — PANTOPRAZOLE SODIUM 40 MG IV SOLR
40.0000 mg | Freq: Every day | INTRAVENOUS | Status: DC
  Administered 2015-07-15 – 2015-07-16 (×2): 40 mg via INTRAVENOUS
  Filled 2015-07-15 (×2): qty 40

## 2015-07-15 MED ORDER — ALTEPLASE (STROKE) FULL DOSE INFUSION
82.0000 mg | INTRAVENOUS | Status: AC
  Administered 2015-07-15: 82 mg via INTRAVENOUS
  Filled 2015-07-15: qty 82

## 2015-07-15 MED ORDER — ACETAMINOPHEN 325 MG PO TABS: 650.0000 mg | ORAL_TABLET | ORAL | Status: DC | PRN

## 2015-07-15 MED ORDER — SENNOSIDES-DOCUSATE SODIUM 8.6-50 MG PO TABS: 1.0000 | ORAL_TABLET | Freq: Every evening | ORAL | Status: DC | PRN

## 2015-07-15 MED ORDER — LABETALOL HCL 5 MG/ML IV SOLN: 10.0000 mg | INTRAVENOUS | Status: DC | PRN

## 2015-07-15 MED ORDER — SODIUM CHLORIDE 0.9 % IV SOLN
INTRAVENOUS | Status: DC
  Administered 2015-07-15: 18:00:00 via INTRAVENOUS

## 2015-07-15 MED ORDER — STROKE: EARLY STAGES OF RECOVERY BOOK
Freq: Once | Status: AC
  Administered 2015-07-15: 18:00:00
  Filled 2015-07-15: qty 1

## 2015-07-15 MED ORDER — ACETAMINOPHEN 650 MG RE SUPP: 650.0000 mg | RECTAL | Status: DC | PRN

## 2015-07-15 NOTE — ED Notes (Addendum)
Pt was eating lunch with wife and he began shaking his hands stating that his hands were numb.  The then began having R sided facial droop and could not express his thoughts.  Upon driving pt to pcp's most of symptoms resolved except for tongue deviating to R and disarthria. CBG 112.

## 2015-07-15 NOTE — Code Documentation (Addendum)
81yo male arriving to [facility] via [place] at [phone].  Patient went to his primary care physician d/t having sudden right hand weakness while eating lunch today at 1230.  Patient's wife reports that they got home around 1210 and ate lunch and she noticed the patient was having difficulty with his hands then subsequently dropped the coffee mug in his right hand.  She then noticed right facial droop and took the patient to his physician's office.  Patient on Aggrenox and wife gave ASA following onset of symptoms.  EMS was called but all symptoms had resolved other than some tongue deviation to the right.  En route to the hospital EMS reported that patient began having word finding difficulty.  Code stroke called on arrival to the ED.  Stroke team to the bedside.  Patient to CT.  NIHSS 2, see documentation for details and code stroke times.  Dr. Armida Sans at the bedside.  Patient with some improvement in speech, however, speech not completely fluent.  While at the bedside patient began having worsening of speech with expressive aphasia.  Dr. Armida Sans discussed treatment with tPA with patient's wife and pharmacy notified to mix tPA at 1512.  2nd PIV started by ED RN.  Patient with waxing and waning of symptoms.  tPA delivered to the bedside at 1521.  BP within tPA parameters.  tPA 8mg  bolus given at 1524 over 1 minute followed by 74mg /hr for a total of 82mg  per pharmacy dosing.  Patient monitored frequently per post-tPA protocol.  Patient continued to have loss of fluency of speech.  Patient is a unit [facility] admission candidate per Dr. Armida Sans. Patient transferred to unit 5C15.  tPA completed at 1626 and NS flush started.  Bedside handoff with [place] and [place].

## 2015-07-15 NOTE — ED Notes (Signed)
Attempted report 

## 2015-07-15 NOTE — ED Provider Notes (Signed)
CSN: [id number]     Arrival date & time 07/15/15  1443 History   First MD Initiated Contact with Patient 07/15/15 1458     Chief Complaint  Patient presents with  . Code Stroke     (Consider location/radiation/quality/duration/timing/severity/associated sxs/prior Treatment) HPI Comments: 81 year old male with past medical history including hypertension, hyperlipidemia, CAD s/p MI, CVA, T2DM who p/w weakness and slurred speech. History obtained with the assistance of the patient's wife. She reports that at 12:30 today, they were eating lunch when the patient began shaking his hands, saying that they felt numb. He then dropped a coffee cup with his right hand. He got up and went to the den and a few minutes later she noticed slurred speech and right-sided facial droop. She took him to his PCP where he was immediately transferred here for concern for stroke. Patient currently denies any pain, headache, dizziness, visual changes, extremity numbness or weakness. No fevers, vomiting, or recent illness. He only endorses "not feeling 100%."  Of note, the patient did have 2 recent TIAs, most recently on 06/13/15. His symptoms resolved at that time.  The history is provided by the spouse and the patient.    Past Medical History  Diagnosis Date  . DYSLIPIDEMIA   . HYPERTENSION   . CORONARY ATHEROSCLEROSIS NATIVE CORONARY ARTERY     Myocardial infarction at [facility] in 1996 with a directional atherectomy of the 90% LAD stenosis and medical management of 60-70% proximal circumflex and 50% RCA stenosis.  . Cerebral atherosclerosis   . UPPER RESPIRATORY INFECTION   . ILIAC ARTERY ANEURYSM   . Diabetes mellitus without complication ([place])   . Arthritis   . Asthma   . TIA (transient ischemic attack)    Past Surgical History  Procedure Laterality Date  . Intraocular lens insertion    . Right knee surgery    . Repair of rectal fistula     Family History  Problem Relation Age of Onset  .  Coronary artery disease Mother   . Cancer Father     lung  . Cancer Brother     lung   Social History  Substance Use Topics  . Smoking status: Former Smoker    Quit date: 11/24/1984  . Smokeless tobacco: Never Used  . Alcohol Use: No    Review of Systems 10 Systems reviewed and are negative for acute change except as noted in the HPI.    Allergies  Codeine; Lopid; and Voltaren  Home Medications   Prior to Admission medications   Medication Sig Start Date End Date Taking? Authorizing Provider  albuterol (PROAIR HFA) 108 (90 BASE) MCG/ACT inhaler INHALE TWO PUFFS INTO LUNGS 4 TIMES DAILY AS NEEDED 02/08/15   Chipper Herb, MD  amLODipine (NORVASC) 5 MG tablet Take 1 tablet (5 mg total) by mouth daily. 02/08/15   Chipper Herb, MD  benazepril (LOTENSIN) 20 MG tablet Take 1 tablet (20 mg total) by mouth daily. 05/23/15   Timmothy Euler, MD  dipyridamole-aspirin (AGGRENOX) 200-25 MG per 12 hr capsule Take 1 capsule by mouth 2 (two) times daily. 02/08/15   Chipper Herb, MD  fish oil-omega-3 fatty acids 1000 MG capsule Take 2 g by mouth daily.      Historical Provider, MD  Fluticasone-Salmeterol (ADVAIR DISKUS) 100-50 MCG/DOSE AEPB INHALE ONE DOSE BY MOUTH EVERY 12 HOURS AS NEEDED 02/08/15   Chipper Herb, MD  hydrochlorothiazide (HYDRODIURIL) 25 MG tablet TAKE ONE-HALF TABLET BY MOUTH ONCE DAILY EXCEPT  ON  MONDAY,  [facility]  AND  FRIDAY  TAKE  A  WHOLE  TABLET 02/08/15   Chipper Herb, MD  ibuprofen (ADVIL,MOTRIN) 200 MG tablet Take 200 mg by mouth daily.    Historical Provider, MD  meclizine (ANTIVERT) 25 MG tablet TAKE  (1)  TABLET  FOUR TIMES DAILY AS NEEDED. 07/09/15   Timmothy Euler, MD  meloxicam (MOBIC) 15 MG tablet TAKE 1 TABLET DAILY 05/14/15   Chipper Herb, MD  metFORMIN (GLUCOPHAGE-XR) 500 MG 24 hr tablet Take 1 tablet (500 mg total) by mouth 2 (two) times daily. 02/08/15   Chipper Herb, MD  metoprolol (LOPRESSOR) 100 MG tablet Take 1 tablet (100 mg total) by mouth 2  (two) times daily. 02/08/15   Chipper Herb, MD  niacin (NIASPAN) 1000 MG CR tablet Take 1 tablet (1,000 mg total) by mouth at bedtime. 02/08/15   Chipper Herb, MD  NITROSTAT 0.4 MG SL tablet USE AS DIRECTED 06/27/14   Chipper Herb, MD  rosuvastatin (CRESTOR) 10 MG tablet Take 1 tablet (10 mg total) by mouth daily. 02/08/15   Chipper Herb, MD  Vitamin D, Ergocalciferol, (DRISDOL) 50000 UNITS CAPS capsule Take 1 capsule (50,000 Units total) by mouth every 7 (seven) days. 10/03/14   Chipper Herb, MD   There were no vitals taken for this visit. Physical Exam  Constitutional: He is oriented to person, place, and time. He appears well-developed and well-nourished. No distress.  Awake, alert  HENT:  Head: Normocephalic and atraumatic.  Mild facial droop at right mouth  Eyes: Conjunctivae and EOM are normal. Pupils are equal, round, and reactive to light.  Neck: Neck supple.  Cardiovascular: Normal rate, regular rhythm and normal heart sounds.   No murmur heard. Pulmonary/Chest: Effort normal and breath sounds normal. No respiratory distress.  Abdominal: Soft. Bowel sounds are normal. He exhibits no distension.  Musculoskeletal: He exhibits no edema or tenderness.  Neurological: He is alert and oriented to person, place, and time. He has normal reflexes. He exhibits normal muscle tone.  Mild expressive aphasia, unable to repeat "No ifs, ands or buts"; +R hand pronator drift, abnormal finger-to-nose testing on R; 4+/5 strength RUE, 5/5 strength LUE and BLE; mild facial asymmetry w/ subtle R droop at mouth but otherwise the rest of CN intact; normal sensation throughout  Skin: Skin is warm and dry.  Psychiatric: He has a normal mood and affect. Judgment and thought content normal.  Nursing note and vitals reviewed.   ED Course  .Critical Care Performed by: Sharlett Iles Authorized by: Sharlett Iles Total critical care time: 35 minutes Critical care time was exclusive of  separately billable procedures and treating other patients. Critical care was necessary to treat or prevent imminent or life-threatening deterioration of the following conditions: CNS failure or compromise. Critical care was time spent personally by me on the following activities: development of treatment plan with patient or surrogate, discussions with consultants, evaluation of patient's response to treatment, examination of patient, obtaining history from patient or surrogate, ordering and performing treatments and interventions, ordering and review of laboratory studies, ordering and review of radiographic studies, re-evaluation of patient's condition and review of old charts.   (including critical care time) Labs Review Labs Reviewed  CBC - Abnormal; Notable for the following:    MCH 34.2 (*)    All other components within normal limits  I-STAT CHEM 8, ED - Abnormal; Notable for the following:  Chloride 94 (*)    Creatinine, Ser 1.30 (*)    Glucose, Bld 178 (*)    Calcium, Ion 1.09 (*)    All other components within normal limits  DIFFERENTIAL  PROTIME-INR  APTT  COMPREHENSIVE METABOLIC PANEL  I-STAT TROPOININ, ED  CBG MONITORING, ED    Imaging Review Ct Head Wo Contrast  07/15/2015  CLINICAL DATA:  Code stroke EXAM: CT HEAD WITHOUT CONTRAST TECHNIQUE: Contiguous axial images were obtained from the base of the skull through the vertex without intravenous contrast. COMPARISON:  None. FINDINGS: Generalized atrophy. Chronic microvascular ischemia in the white matter. Negative for acute infarct. Negative for hemorrhage or mass. No edema or shift of the midline structures. Calvarium intact. IMPRESSION: Atrophy and mild chronic microvascular ischemia. No acute abnormality. Critical Value/emergent results were called by telephone at the time of interpretation on 07/15/2015 at 3:03 pm to Dr. Armida Sans , who verbally acknowledged these results. Electronically Signed   By: Franchot Gallo M.D.    On: 07/15/2015 15:03   I have personally reviewed and evaluated these images and lab results as part of my medical decision-making.   EKG Interpretation None      MDM   Final diagnoses:  Ischemic stroke ([place])  Acute right-sided weakness  Slurred speech    Patient with an extensive medical history who presents from his PCP office for concern for strokelike symptoms that began at 12:30 today. The patient was declared a stroke alert and taken immediately to CT scanner. I reviewed CT scan which was negative for acute hemorrhage. Reviewed his EKG which showed sinus rhythm and no acute ischemic changes. Neurology, Dr. Armida Sans, at bedside in ED. On exam, the patient has mild expressive aphasia and right upper extremity weakness with pronator drift and abnormal finger to nose testing. He denies any complaints and is oriented and able to follow commands. Based on his persistent symptoms with clear time of onset, Dr. Armida Sans determined that patient eligible for PRISMs study which he discussed with patient's wife. The patient will receive TPA versus placebo and will be admitted to the stroke service for further care.  Patient admitted for further treatment   Sharlett Iles, MD 07/15/15 1616

## 2015-07-15 NOTE — Patient Instructions (Signed)
We are sending you to the hospital for evaluation.

## 2015-07-15 NOTE — H&P (Signed)
Admission H&P    Chief Complaint: code stroke, right hand weakness, right lower face weakness, difficulty talking. HPI: [name] is an 81 y.o. male with a past medical history significant for HTN, DM, CAD, MI, TIA, iliac artery aneurysm, brought in by EMS as a code stroke due to acute onset onset of the above stated symptoms. Wife is at the bedside and said that they were eating lunch when he began to have trouble using his hands and dropped a cup from his right hand. Then, he started having difficulty expressing himself and she noticed some right face droopiness and thus EMS was summoned.  Initial NIHSS 2, with fluctuating non fluent spontaneous language. CT brain was personally reviewed and showed no acute abnormality. Denies HA, vertigo, double vision, focal weakness or numbness, confusion, or vision impairment. Patient takes aggrenox daily.   LSN: 12:30 pm NIHSS 2  tPA Given: yes Modified Rankin score: 0   Past Medical History  Diagnosis Date  . DYSLIPIDEMIA   . HYPERTENSION   . CORONARY ATHEROSCLEROSIS NATIVE CORONARY ARTERY     Myocardial infarction at [facility] in 1996 with a directional atherectomy of the 90% LAD stenosis and medical management of 60-70% proximal circumflex and 50% RCA stenosis.  . Cerebral atherosclerosis   . UPPER RESPIRATORY INFECTION   . ILIAC ARTERY ANEURYSM   . Diabetes mellitus without complication ([place])   . Arthritis   . Asthma   . TIA (transient ischemic attack)     Past Surgical History  Procedure Laterality Date  . Intraocular lens insertion    . Right knee surgery    . Repair of rectal fistula      Family History  Problem Relation Age of Onset  . Coronary artery disease Mother   . Cancer Father     lung  . Cancer Brother     lung   Social History:  reports that he quit smoking about 30 years ago. He has never used smokeless tobacco. He reports that he does not drink alcohol or use illicit drugs.  Allergies:  Allergies   Allergen Reactions  . Codeine Other (See Comments)    Bad dreams  . Lopid [Gemfibrozil] Nausea Only  . Voltaren [Diclofenac Sodium] Diarrhea     (Not in a hospital admission)  ROS: History obtained from patient, family, and chart review  General ROS: negative for - chills, fatigue, fever, night sweats, weight gain or weight loss Psychological ROS: negative for - behavioral disorder, hallucinations, memory difficulties, mood swings or suicidal ideation Ophthalmic ROS: negative for - blurry vision, double vision, eye pain or loss of vision ENT ROS: negative for - epistaxis, nasal discharge, oral lesions, sore throat, tinnitus or vertigo Allergy and Immunology ROS: negative for - hives or itchy/watery eyes Hematological and Lymphatic ROS: negative for - bleeding problems, bruising or swollen lymph nodes Endocrine ROS: negative for - galactorrhea, hair pattern changes, polydipsia/polyuria or temperature intolerance Respiratory ROS: negative for - cough, hemoptysis, shortness of breath or wheezing Cardiovascular ROS: negative for - chest pain, dyspnea on exertion, edema or irregular heartbeat Gastrointestinal ROS: negative for - abdominal pain, diarrhea, hematemesis, nausea/vomiting or stool incontinence Genito-Urinary ROS: negative for - dysuria, hematuria, incontinence or urinary frequency/urgency Musculoskeletal ROS: negative for - joint swelling or muscular weakness Neurological ROS: as noted in HPI   Physical Examination: Blood pressure 137/80, pulse 80, temperature 98.6 F (37 C), temperature source Oral, resp. rate 16, height 5\' 11"  (1.803 m), weight 91.4 kg (201 lb 8  oz), SpO2 100 %.  HEENT-  Normocephalic, no lesions, without obvious abnormality.  Normal external eye and conjunctiva.  Normal TM's bilaterally.  Normal auditory canals and external ears. Normal external nose, mucus membranes and septum.  Normal pharynx. Neck supple with no masses, nodes, nodules or  enlargement. Cardiovascular - regular rate and rhythm, S1, S2 normal, no murmur, click, rub or gallop Lungs - chest clear, no wheezing, rales, normal symmetric air entry, Heart exam - S1, S2 normal, no murmur, no gallop, rate regular Abdomen - soft, non-tender; bowel sounds normal; no masses,  no organomegaly Extremities - no edema, cyanosis, or clubbing.  Neurologic Examination: General: NAD Mental Status: Alert, oriented, thought content appropriate. Spontaneous language is non fluent, at times with impaired comprehension . Able to follow 3 step commands without difficulty. Cranial Nerves: II: Discs flat bilaterally; Visual fields grossly normal, pupils equal, round, reactive to light and accommodation III,IV, VI: ptosis not present, extra-ocular motions intact bilaterally V,VII: smile asymmetric due to mild right lower face weakness, facial light touch sensation normal bilaterally VIII: hearing normal bilaterally IX,X: uvula rises symmetrically XI: bilateral shoulder shrug XII: midline tongue extension without atrophy or fasciculations Motor: Right : Upper extremity   5/5    Left:     Upper extremity   5/5  Lower extremity   5/5     Lower extremity   5/5 Tone and bulk:normal tone throughout; no atrophy noted Sensory: Pinprick and light touch intact throughout, bilaterally Deep Tendon Reflexes:  1+ all over Plantars: Right: downgoing   Left: downgoing Cerebellar: normal finger-to-nose,  normal heel-to-shin test Gait:  No tested due to multiple leads    Results for orders placed or performed during the hospital encounter of 07/15/15 (from the past 48 hour(s))  I-stat troponin, ED (not at [facility], [facility])     Status: None   Collection Time: 07/15/15  3:01 PM  Result Value Ref Range   Troponin i, poc 0.00 0.00 - 0.08 ng/mL   Comment 3            Comment: Due to the release kinetics of cTnI, a negative result within the first hours of the onset of symptoms does not rule  out myocardial infarction with certainty. If myocardial infarction is still suspected, repeat the test at appropriate intervals.   Protime-INR     Status: None   Collection Time: 07/15/15  3:02 PM  Result Value Ref Range   Prothrombin Time 13.8 11.6 - 15.2 seconds   INR 1.04 0.00 - 1.49  APTT     Status: None   Collection Time: 07/15/15  3:02 PM  Result Value Ref Range   aPTT 28 24 - 37 seconds  CBC     Status: Abnormal   Collection Time: 07/15/15  3:02 PM  Result Value Ref Range   WBC 9.7 4.0 - 10.5 K/uL   RBC 4.56 4.22 - 5.81 MIL/uL   Hemoglobin 15.6 13.0 - 17.0 g/dL   HCT 43.8 39.0 - 52.0 %   MCV 96.1 78.0 - 100.0 fL   MCH 34.2 (H) 26.0 - 34.0 pg   MCHC 35.6 30.0 - 36.0 g/dL   RDW 12.3 11.5 - 15.5 %   Platelets 167 150 - 400 K/uL  Differential     Status: None   Collection Time: 07/15/15  3:02 PM  Result Value Ref Range   Neutrophils Relative % 62 %   Neutro Abs 6.1 1.7 - 7.7 K/uL   Lymphocytes Relative 24 %  Lymphs Abs 2.3 0.7 - 4.0 K/uL   Monocytes Relative 9 %   Monocytes Absolute 0.8 0.1 - 1.0 K/uL   Eosinophils Relative 5 %   Eosinophils Absolute 0.5 0.0 - 0.7 K/uL   Basophils Relative 0 %   Basophils Absolute 0.0 0.0 - 0.1 K/uL  I-Stat Chem 8, ED  (not at [facility], [facility])     Status: Abnormal   Collection Time: 07/15/15  3:03 PM  Result Value Ref Range   Sodium 136 135 - 145 mmol/L   Potassium 3.9 3.5 - 5.1 mmol/L   Chloride 94 (L) 101 - 111 mmol/L   BUN 18 6 - 20 mg/dL   Creatinine, Ser 1.30 (H) 0.61 - 1.24 mg/dL   Glucose, Bld 178 (H) 65 - 99 mg/dL   Calcium, Ion 1.09 (L) 1.13 - 1.30 mmol/L   TCO2 26 0 - 100 mmol/L   Hemoglobin 16.7 13.0 - 17.0 g/dL   HCT 49.0 39.0 - 52.0 %   Ct Head Wo Contrast  07/15/2015  CLINICAL DATA:  Code stroke EXAM: CT HEAD WITHOUT CONTRAST TECHNIQUE: Contiguous axial images were obtained from the base of the skull through the vertex without intravenous contrast. COMPARISON:  None. FINDINGS: Generalized atrophy. Chronic  microvascular ischemia in the white matter. Negative for acute infarct. Negative for hemorrhage or mass. No edema or shift of the midline structures. Calvarium intact. IMPRESSION: Atrophy and mild chronic microvascular ischemia. No acute abnormality. Critical Value/emergent results were called by telephone at the time of interpretation on 07/15/2015 at 3:03 pm to Dr. Armida Sans , who verbally acknowledged these results. Electronically Signed   By: Franchot Gallo M.D.   On: 07/15/2015 15:03    Assessment: 81 y.o. male with acute onset right face weakness, right hand clumsiness (resolverd), and language disturbance as described above. NIHSS 2, with fluctuating expressive dysphasia. CT brain reveals no acute abnormality. Patient language has not fully returned to normal, and thus after carefully reviewing inclusion and exclusion criteria  I recommended proceeding with iv tPA. Family is at the bedside and was updated regarding patient condition. No contraindications to be admitted to a stroke bed in the 5th floor. Stroke team will resume care in the morning.   Stroke Risk Factors - age, HTN, DM, CAD, TIA   Plan: 1. HgbA1c, fasting lipid panel 2. MRI, MRA  of the brain without contrast 3. PT consult, OT consult, Speech consult 4. Echocardiogram 5. Carotid dopplers 6. Prophylactic therapy-as per post iv tPA protocol 7. Risk factor modification 8. Telemetry monitoring 9. NPO  Dorian Pod, MD [facility] [phone]  07/15/2015, 3:32 PM

## 2015-07-15 NOTE — Progress Notes (Signed)
   HPI  Patient presents today here today with a numbness and slow speech.  Patient is wife is present and states that starting about 20 minutes ago he's developed numbness of his tongue and is speaking slower than usual. He states that he has a history of TIAs. His wife states that over the last 3 weeks he's had 3 or 4 such episodes that have resolved on their own after several hours.  He states that he speaking a little bit slower than usual and having difficulty making his words like usual. He has numbness and tingling of his bilateral hands as well as numbness and tingling of his tongue.   PMH: Smoking status noted ROS: Per HPI  Objective: BP 183/85 mmHg  Pulse 73  Temp(Src) 96.9 F (36.1 C) (Oral)  Ht 5\' 11"  (1.803 m) Gen: NAD, alert, cooperative with exam HEENT: NCAT, EOMI, PERRL CV: RRR, good S1/S2, no murmur Resp: CTABL, no wheezes, non-labored Ext: No edema, warm Neuro: Alert and oriented, strength 5/5 and sensation intact in all 4 extremities, cranial nerves II through XII intact with some right tongue deviation Slowed speech compared to previous  Assessment and plan:  # Possible stroke, history of TIA Given patient's constellation of symptoms and past medical history of stroke I have referred him to the hospital via ambulance. He has only a few neurologic findings that are objective including right tongue deviation and slow speech compared to our previous exam. His wife states that he speaking slowly and seems to be thinking slower than usual. Given his history of TIA think it's very important for him to have emergent eval for stroke.    Laroy Apple, MD [facility] 07/15/2015, 1:24 PM

## 2015-07-16 ENCOUNTER — Inpatient Hospital Stay (HOSPITAL_COMMUNITY): Payer: Medicare Other

## 2015-07-16 ENCOUNTER — Ambulatory Visit (HOSPITAL_COMMUNITY): Payer: Medicare Other

## 2015-07-16 DIAGNOSIS — R479 Unspecified speech disturbances: Secondary | ICD-10-CM

## 2015-07-16 DIAGNOSIS — I639 Cerebral infarction, unspecified: Principal | ICD-10-CM

## 2015-07-16 DIAGNOSIS — R531 Weakness: Secondary | ICD-10-CM | POA: Diagnosis present

## 2015-07-16 DIAGNOSIS — M6289 Other specified disorders of muscle: Secondary | ICD-10-CM

## 2015-07-16 LAB — LIPID PANEL
Cholesterol: 189 mg/dL (ref 0–200)
HDL: 32 mg/dL — AB (ref >40)
LDL CALC: 108 mg/dL — AB (ref 0–99)
Total CHOL/HDL Ratio: 5.9 RATIO
Triglycerides: 245 mg/dL — ABNORMAL HIGH (ref <150)
VLDL: 49 mg/dL — ABNORMAL HIGH (ref 0–40)

## 2015-07-16 MED ORDER — ASPIRIN EC 325 MG PO TBEC
325.0000 mg | DELAYED_RELEASE_TABLET | Freq: Every day | ORAL | Status: DC
  Administered 2015-07-16 – 2015-07-17 (×2): 325 mg via ORAL
  Filled 2015-07-16 (×2): qty 1

## 2015-07-16 NOTE — Procedures (Signed)
History: 81 yo M with aphasia  Sedation: noen  Technique: This is a 21 channel routine scalp EEG performed at the bedside with bipolar and monopolar montages arranged in accordance to the international 10/20 system of electrode placement. One channel was dedicated to EKG recording.    Background: The background predominantly consists of the posterior rhythm which achieves a maximal frequency of 7 Hz. There is some irregular generalized slow activity as well.   Photic stimulation: Physiologic driving is not performed  EEG Abnormalities: 1) Generalized irregular slow activity 2) Slow PDR  Clinical Interpretation: This EEG is consistent with a generalized non-specific cerebral dysfunction(encephalopathy). There was no seizure or seizure predisposition recorded on this study.   Roland Rack, MD [facility] [phone]  If 7pm- 7am, please page neurology on call as listed in [place].

## 2015-07-16 NOTE — Care Management Note (Signed)
Case Management Note  Patient Details  Name: [name] MRN: [id number] Date of Birth: 08/08/1929  Subjective/Objective:                    Action/Plan: Patient was admitted with right hand weakness, right lower face weakness, difficulty talking. Lives at home with spouse.  Will follow for discharge needs pending PT/OT evals and physician orders.  Expected Discharge Date:   (Pending)               Expected Discharge Plan:     In-House Referral:     Discharge planning Services     Post Acute Care Choice:    Choice offered to:     DME Arranged:    DME Agency:     HH Arranged:    HH Agency:     Status of Service:  In process, will continue to follow  Medicare Important Message Given:    Date Medicare IM Given:    Medicare IM give by:    Date Additional Medicare IM Given:    Additional Medicare Important Message give by:     If discussed at [facility] of Stay Meetings, dates discussed:    Additional Comments:  Rolm Baptise, RN 07/16/2015, 11:26 AM [phone]

## 2015-07-16 NOTE — Progress Notes (Signed)
SLP Cancellation Note  Patient Details Name: TALHAH LOCKETT MRN: [id number] DOB: 03-Jun-1930   Cancelled treatment:       Reason Eval/Treat Not Completed: Patient at procedure or test/unavailable.  SLP will follow up as able.  Gunnar Fusi, M.A., CCC-SLP [phone]  [place] 07/16/2015, 2:19 PM

## 2015-07-16 NOTE — Progress Notes (Signed)
VASCULAR LAB PRELIMINARY  PRELIMINARY  PRELIMINARY  PRELIMINARY  Carotid duplex completed.    Preliminary report:  Bilateral:  1-39% ICA stenosis.  Vertebral artery flow is antegrade.     [name], RVS 07/16/2015, 2:12 PM

## 2015-07-16 NOTE — Evaluation (Signed)
Physical Therapy Evaluation and Discharge Patient Details Name: [name] MRN: [id number] DOB: [birth year] Today's Date: 07/16/2015   History of Present Illness  Adm with Rt sided weakness, facial droop, and decr communication. CT negative and given tPA. Speech symptoms have come/gone since admitted. PMHx- previous similar episodes (CVA never confirmed), HTN, DM, CAD, MI, bil knee OA    Clinical Impression  Patient evaluated by Physical Therapy with no further acute PT needs identified. Wife present and confirmed pt at baseline for mobility. All education has been completed and the patient has no further questions.  PT is signing off. Thank you for this referral.     Follow Up Recommendations No PT follow up    Equipment Recommendations  None recommended by PT    Recommendations for Other Services       Precautions / Restrictions Precautions Precautions: None      Mobility  Bed Mobility Overal bed mobility: Independent                Transfers Overall transfer level: Independent Equipment used: None                Ambulation/Gait Ambulation/Gait assistance: Independent Ambulation Distance (Feet): 150 Feet (180) Assistive device: None Gait Pattern/deviations: Step-through pattern;Antalgic (bil knee OA) Gait velocity: able to vary speed up/down Gait velocity interpretation: at or above normal speed for age/gender    Stairs Stairs: Yes Stairs assistance: Modified independent (Device/Increase time) Stair Management: One rail Left;Forwards Number of Stairs: 5 General stair comments: did not feel safe attempting step without UE assist due to knee pain (would use cane at home)  Wheelchair Mobility    Modified Rankin (Stroke Patients Only) Modified Rankin (Stroke Patients Only) Pre-Morbid Rankin Score: No symptoms Modified Rankin: No symptoms     Balance Overall balance assessment: Independent                                Standardized Balance Assessment Standardized Balance Assessment : Dynamic Gait Index   Dynamic Gait Index Level Surface: Normal Change in Gait Speed: Normal Gait with Horizontal Head Turns: Mild Impairment Gait and Pivot Turn: Normal Steps: Mild Impairment       Pertinent Vitals/Pain Pain Assessment:  (chronic back pain, not rated)    Home Living Family/patient expects to be discharged to:: Private residence Living Arrangements: Spouse/significant other Available Help at Discharge: Family;Available 24 hours/day Type of Home: House Home Access: Stairs to enter Entrance Stairs-Rails: None (post along deck) Technical brewer of Steps: 1+1 Home Layout: Multi-level Home Equipment: Cane - single point;Crutches      Prior Function Level of Independence: Independent with assistive device(s)         Comments: uses cane when walking on property     Hand Dominance        Extremity/Trunk Assessment   Upper Extremity Assessment: Overall WFL for tasks assessed;Defer to OT evaluation           Lower Extremity Assessment: Overall WFL for tasks assessed      Cervical / Trunk Assessment: Normal  Communication   Communication: HOH (during PT eval)  Cognition Arousal/Alertness: Awake/alert Behavior During Therapy: WFL for tasks assessed/performed Overall Cognitive Status: Within Functional Limits for tasks assessed                      General Comments General comments (skin integrity, edema, etc.): Wife present and confirmed all answers  to home information. Confirmed his speech is near/at baseline.    Exercises        Assessment/Plan    PT Assessment Patent does not need any further PT services  PT Diagnosis Difficulty walking   PT Problem List    PT Treatment Interventions     PT Goals (Current goals can be found in the Care Plan section) Acute Rehab PT Goals Patient Stated Goal: go home PT Goal Formulation: All assessment and education  complete, DC therapy    Frequency     Barriers to discharge        Co-evaluation               End of Session Equipment Utilized During Treatment: Gait belt Activity Tolerance: Patient tolerated treatment well Patient left: in chair;with call bell/phone within reach;with nursing/sitter in room;with family/visitor present Nurse Communication: Mobility status (no further PT needs; can be Indep when IV d/c'd)         Time: [id number] PT Time Calculation (min) (ACUTE ONLY): 31 min   Charges:   PT Evaluation $Initial PT Evaluation Tier I: 1 Procedure PT Treatments $Gait Training: 8-22 mins   PT G Codes:        [name] 08/11/2015, 1:35 PM Pager [phone]

## 2015-07-16 NOTE — Progress Notes (Signed)
Pt MRI results back. MD paged. MD stated ok to give asa and change vitals and neuro checks to Q4

## 2015-07-16 NOTE — Progress Notes (Signed)
EEG completed, results pending. 

## 2015-07-16 NOTE — Progress Notes (Signed)
STROKE TEAM PROGRESS NOTE   HISTORY [name] is an 81 y.o. male with a past medical history significant for HTN, DM, CAD, MI, TIA, iliac artery aneurysm, brought in by EMS as a code stroke due to acute onset onset of right hand weakness, right lower face weakness, difficulty talking. Wife is at the bedside and said that they were eating lunch when he began to have trouble using his hands and dropped a cup from his right hand. Then, he started having difficulty expressing himself and she noticed some right face droopiness and thus EMS was summoned. (LKW 07/14/2105 at 1230p). Initial NIHSS 2, with fluctuating non fluent spontaneous language. CT brain was personally reviewed and showed no acute abnormality. Denies HA, vertigo, double vision, focal weakness or numbness, confusion, or vision impairment. Patient takes aggrenox daily. NIHSS 2.  Modified Rankin score: 0. Patient was administered TPA 07/14/2105 at 1524. He was admitted to the stroke floor [facility] for further evaluation and treatment.   SUBJECTIVE (INTERVAL HISTORY) His wife and daughter in law are at the bedside.  He had fluctuating aphasia this am, CT unremarkable. No stroke seen. No hx seizures per pt. Had stroke previously with similar sx. Patient has episodes of hand shaking and abnormal speech at home. He remains independent otherwise.   OBJECTIVE Temp:  [96.9 F (36.1 C)-98.6 F (37 C)] 98 F (36.7 C) (12/13 1030) Pulse Rate:  [72-104] 99 (12/13 1030) Cardiac Rhythm:  [-] Normal sinus rhythm (12/13 0700) Resp:  [13-26] 16 (12/13 1030) BP: (109-183)/(64-97) 137/77 mmHg (12/13 1030) SpO2:  [92 %-100 %] 95 % (12/13 1030) Weight:  [91.4 kg (201 lb 8 oz)] 91.4 kg (201 lb 8 oz) (12/12 1516)  CBC:   Recent Labs Lab 07/15/15 1502 07/15/15 1503  WBC 9.7  --   NEUTROABS 6.1  --   HGB 15.6 16.7  HCT 43.8 49.0  MCV 96.1  --   PLT 167  --     Basic Metabolic Panel:   Recent Labs Lab 07/15/15 1502 07/15/15 1503  NA 134* 136   K 4.1 3.9  CL 98* 94*  CO2 26  --   GLUCOSE 183* 178*  BUN 15 18  CREATININE 1.39* 1.30*  CALCIUM 9.2  --     Lipid Panel:     Component Value Date/Time   CHOL 189 07/16/2015 0735   CHOL 132 02/02/2013 0853   TRIG 245* 07/16/2015 0735   TRIG 123 02/08/2015 1009   TRIG 172* 02/02/2013 0853   HDL 32* 07/16/2015 0735   HDL 41 02/08/2015 1009   HDL 33* 02/02/2013 0853   CHOLHDL 5.9 07/16/2015 0735   VLDL 49* 07/16/2015 0735   LDLCALC 108* 07/16/2015 0735   LDLCALC 49 05/02/2014 0934   LDLCALC 65 02/02/2013 0853   HgbA1c:  Lab Results  Component Value Date   HGBA1C 6.2 05/23/2015   Urine Drug Screen: No results found for: LABOPIA, COCAINSCRNUR, LABBENZ, AMPHETMU, THCU, LABBARB    IMAGING  Ct Head Wo Contrast 07/16/2015  Stable noncontrast CT appearance of the brain since yesterday. No evolving infarct identified. No intracranial hemorrhage or mass effect.  07/15/2015  Atrophy and mild chronic microvascular ischemia. No acute abnormality.     PHYSICAL EXAM Pleasant elderly Caucasian male not in distress. . Afebrile. Head is nontraumatic. Neck is supple without bruit.    Cardiac exam no murmur or gallop. Lungs are clear to auscultation. Distal pulses are well felt. Neurological Exam :  Awake alert oriented 3. Speech  seems quite fluent with only occasional word finding difficulty.  He will to comprehend, repeat and name quite well. Extraocular movements are full range without nystagmus. Fundi were not visualized. Vision acuity seems adequate. Pupils equal reactive. Face is symmetric without weakness. Tongue is midline. Motor system exam revealed no upper or lower extremity drift. Symmetric and equal strength in all 4 extremities. No focal weakness. Sensation appears preserved bilaterally. Deep tendon pulses symmetric. Plantars are downgoing. Gait was not tested. ASSESSMENT/PLAN [name] is a 81 y.o. male with history of HTN, DM, CAD, MI, TIA, iliac artery aneurysm  presenting with right hand weakness and expressive aphaisa with worsening of aphasia in the ED. He received IV t-PA 07/15/2015 at 1524.   Recurrent speech abnormalities:  Likely L brain cortical infarct, etiology unknown, stroke workup underway, fluctuating symptoms this am. Stroke sx are repetitive If MRI negative for stroke Suspicious for seizures vs primary progressive aphasia.  Resultant  Expressive aphaisa, unable to repeat,   Repeat CT without acute stroke, acute abnormality  MRI  pending   MRA  pending   Carotid Doppler  pending   2D Echo  pending   Check EEG to r/o seizures  LDL 108  HgbA1c 6.2 in Oct  SCDs for VTE prophylaxis Diet Heart Room service appropriate?: Yes; Fluid consistency:: Thin  dipyridamole SR 250 mg/aspirin 25 mg twice a day prior to admission, now on No antithrombotic as within 24h of tPA. Start aspirin 24h post tPA  Ongoing aggressive stroke risk factor management  Therapy recommendations:  Pending. Ok for pt to be OOB now.  Disposition:  pending   Hypertension  Stable  Hyperlipidemia  Home meds:  crestor 10 and omega 3  LDL 108, goal < 70  Continue statin at discharge  Diabetes type II  HgbA1c 6.2 in Oct, at goal < 7.0  Controlled  Other Stroke Risk Factors  Advanced age  Former Cigarette smoker, quit smoking 30 years ago Hx stroke/TIA  2003 - aphasia, improved [facility])            Coronary artery disease  R ilicac artery aneurysm  Known ICA stenosis per pt, states they follow it  Other Active Problems  Chronic kidney disease, Cr 1.3  Hospital day # [place] for Pager information 07/16/2015 11:42 AM  I have personally examined this patient, reviewed notes, independently viewed imaging studies, participated in medical decision making and plan of care. I have made any additions or clarifications directly to the above note. Agree with note above. The patient presented with  fluctuating aphasia and speech difficulties and received IV tPA and seemed to have improvement. He had worsening of her symptoms this morning transiently and repeat CT scan shows no acute abnormality. He remains at risk for neurological worsening, recurrent stroke, TIA and possible seizures. He needs close neurological monitoring and aggressive blood pressure control. I had a long discussion with the patient, wife and daughter at the bedside and answered questions. Recommend check EEG. Given the fact that he is had somewhat similar process in the past with negative workup possibility of simple partial seizures versus primary progressive aphasia may need to be considered if MRI shows no acute stroke This patient is critically ill and at significant risk of neurological worsening, death and care requires constant monitoring of vital signs, hemodynamics,respiratory and cardiac monitoring, extensive review of multiple databases, frequent neurological assessment, discussion with family, other specialists and medical decision making of  high complexity.I have made any additions or clarifications directly to the above note.This critical care time does not reflect procedure time, or teaching time or supervisory time of PA/NP/Med Resident etc but could involve care discussion time.  I spent 30 minutes of neurocritical care time  in the care of  this patient.   Antony Contras, MD Medical Director [facility] Pager: [phone] 07/16/2015 3:49 PM    To contact Stroke Continuity provider, please refer to [email or web address] After hours, contact General Neurology

## 2015-07-16 NOTE — Progress Notes (Signed)
OT Cancellation Note  Patient Details Name: [name] MRN: [id number] DOB: 02-11-1930   Cancelled Treatment:    Reason Eval/Treat Not Completed: Medical issues which prohibited therapy--pt currently on strict bedrest. Will attempt to see later if time allows.   Redmond Baseman, OTR/L 07/16/2015, 8:28 AM

## 2015-07-16 NOTE — Progress Notes (Signed)
PT Cancellation Note  Patient Details Name: [name] MRN: [id number] DOB: February 24, 1930   Cancelled Treatment:    Reason Eval/Treat Not Completed: Medical issues which prohibited therapy--pt currently on strict bedrest. Will check on later today   Yesenia Fontenette 07/16/2015, 8:08 AM Pager [phone]

## 2015-07-16 NOTE — Progress Notes (Signed)
Upon Q1 hour neuro check noted that pt was having expressive and receptive aphasia. At his 0730 check he only had slurred speech. MD notified. Order for CT given. Episode lasted about 45 minutes then returned to baseline.

## 2015-07-17 ENCOUNTER — Ambulatory Visit (HOSPITAL_COMMUNITY): Payer: Medicare Other

## 2015-07-17 DIAGNOSIS — I6789 Other cerebrovascular disease: Secondary | ICD-10-CM

## 2015-07-17 DIAGNOSIS — N183 Chronic kidney disease, stage 3 unspecified: Secondary | ICD-10-CM | POA: Diagnosis present

## 2015-07-17 LAB — HEMOGLOBIN A1C
Hgb A1c MFr Bld: 6.4 % — ABNORMAL HIGH (ref 4.8–5.6)
MEAN PLASMA GLUCOSE: 137 mg/dL

## 2015-07-17 MED ORDER — CLOPIDOGREL BISULFATE 75 MG PO TABS: 75.0000 mg | ORAL_TABLET | Freq: Every day | ORAL | Status: DC

## 2015-07-17 NOTE — Discharge Summary (Signed)
Stroke Discharge Summary  Patient ID: [name]   MRN: [id number]      DOB: 04-22-1930  Date of Admission: 07/15/2015 Date of Discharge: 07/17/2015  Attending Physician:  Garvin Fila, MD, Stroke MD Patient's PCP:  Redge Gainer, MD  DISCHARGE DIAGNOSIS:  Principal Problem:   CVA (cerebral infarction) - L internal capsule infarct s/p IV tPA secondary to small vessel disease  Active Problems:   Hyperlipidemia LDL goal <70   Essential hypertension   CORONARY ATHEROSCLEROSIS NATIVE CORONARY ARTERY   Cerebral atherosclerosis   ILIAC ARTERY ANEURYSM   PVD (peripheral vascular disease) ([place])   Diabetes ([place])   History of TIA (transient ischemic attack)   Acute right-sided weakness   Chronic kidney disease  Past Medical History  Diagnosis Date  . DYSLIPIDEMIA   . HYPERTENSION   . CORONARY ATHEROSCLEROSIS NATIVE CORONARY ARTERY     Myocardial infarction at [facility] in 1996 with a directional atherectomy of the 90% LAD stenosis and medical management of 60-70% proximal circumflex and 50% RCA stenosis.  . Cerebral atherosclerosis   . UPPER RESPIRATORY INFECTION   . ILIAC ARTERY ANEURYSM   . Diabetes mellitus without complication ([place])   . Arthritis   . Asthma   . TIA (transient ischemic attack)   . Myocardial infarction [facility]) 1996   Past Surgical History  Procedure Laterality Date  . Intraocular lens insertion    . Right knee surgery    . Repair of rectal fistula        Medication List    STOP taking these medications        dipyridamole-aspirin 200-25 MG 12hr capsule  Commonly known as:  AGGRENOX     ibuprofen 200 MG tablet  Commonly known as:  ADVIL,MOTRIN     meclizine 25 MG tablet  Commonly known as:  ANTIVERT     meloxicam 15 MG tablet  Commonly known as:  MOBIC      TAKE these medications        albuterol 108 (90 BASE) MCG/ACT inhaler  Commonly known as:  PROAIR HFA  INHALE TWO PUFFS INTO LUNGS 4 TIMES DAILY AS NEEDED     amLODipine 5  MG tablet  Commonly known as:  NORVASC  Take 1 tablet (5 mg total) by mouth daily.     benazepril 20 MG tablet  Commonly known as:  LOTENSIN  Take 1 tablet (20 mg total) by mouth daily.     clopidogrel 75 MG tablet  Commonly known as:  PLAVIX  Take 1 tablet (75 mg total) by mouth daily.  Start taking on:  07/18/2015     fish oil-omega-3 fatty acids 1000 MG capsule  Take 2 g by mouth daily.     Fluticasone-Salmeterol 100-50 MCG/DOSE Aepb  Commonly known as:  ADVAIR DISKUS  INHALE ONE DOSE BY MOUTH EVERY 12 HOURS AS NEEDED     hydrochlorothiazide 25 MG tablet  Commonly known as:  HYDRODIURIL  TAKE ONE-HALF TABLET BY MOUTH ONCE DAILY EXCEPT  ON  MONDAY,  WEDNESDAY  AND  FRIDAY  TAKE  A  WHOLE  TABLET     metFORMIN 500 MG 24 hr tablet  Commonly known as:  GLUCOPHAGE-XR  Take 1 tablet (500 mg total) by mouth 2 (two) times daily.     metoprolol 100 MG tablet  Commonly known as:  LOPRESSOR  Take 1 tablet (100 mg total) by mouth 2 (two) times daily.     niacin 1000  MG CR tablet  Commonly known as:  NIASPAN  Take 1 tablet (1,000 mg total) by mouth at bedtime.     NITROSTAT 0.4 MG SL tablet  Generic drug:  nitroGLYCERIN  USE AS DIRECTED     rosuvastatin 10 MG tablet  Commonly known as:  CRESTOR  Take 1 tablet (10 mg total) by mouth daily.     Vitamin D (Ergocalciferol) 50000 UNITS Caps capsule  Commonly known as:  DRISDOL  Take 1 capsule (50,000 Units total) by mouth every 7 (seven) days.        LABORATORY STUDIES CBC    Component Value Date/Time   WBC 9.7 07/15/2015 1502   WBC 7.5 02/08/2015 1010   WBC 6.6 08/01/2013 0855   RBC 4.56 07/15/2015 1502   RBC 4.37* 02/08/2015 1010   RBC 4.67 08/01/2013 0855   HGB 16.7 07/15/2015 1503   HGB 14.7 02/08/2015 1010   HCT 49.0 07/15/2015 1503   HCT 42.7* 02/08/2015 1010   PLT 167 07/15/2015 1502   MCV 96.1 07/15/2015 1502   MCV 97.7* 02/08/2015 1010   MCH 34.2* 07/15/2015 1502   MCH 33.7* 02/08/2015 1010   MCH 33.0  08/01/2013 0855   MCHC 35.6 07/15/2015 1502   MCHC 34.5 02/08/2015 1010   MCHC 34.8 08/01/2013 0855   RDW 12.3 07/15/2015 1502   RDW 12.9 08/01/2013 0855   LYMPHSABS 2.3 07/15/2015 1502   LYMPHSABS 2.1 08/01/2013 0855   MONOABS 0.8 07/15/2015 1502   EOSABS 0.5 07/15/2015 1502   EOSABS 0.4 08/01/2013 0855   BASOSABS 0.0 07/15/2015 1502   BASOSABS 0.0 08/01/2013 0855   CMP    Component Value Date/Time   NA 136 07/15/2015 1503   NA 137 02/08/2015 1009   K 3.9 07/15/2015 1503   CL 94* 07/15/2015 1503   CO2 26 07/15/2015 1502   GLUCOSE 178* 07/15/2015 1503   GLUCOSE 166* 02/08/2015 1009   BUN 18 07/15/2015 1503   BUN 15 02/08/2015 1009   CREATININE 1.30* 07/15/2015 1503   CREATININE 1.27 02/02/2013 0853   CALCIUM 9.2 07/15/2015 1502   PROT 7.2 07/15/2015 1502   PROT 6.7 02/08/2015 1009   ALBUMIN 4.0 07/15/2015 1502   ALBUMIN 4.4 02/08/2015 1009   AST 24 07/15/2015 1502   ALT 22 07/15/2015 1502   ALKPHOS 82 07/15/2015 1502   BILITOT 1.1 07/15/2015 1502   BILITOT 0.9 02/08/2015 1009   GFRNONAA 45* 07/15/2015 1502   GFRNONAA 53* 02/02/2013 0853   GFRAA 52* 07/15/2015 1502   GFRAA 61 02/02/2013 0853   COAGS Lab Results  Component Value Date   INR 1.04 07/15/2015   Lipid Panel    Component Value Date/Time   CHOL 189 07/16/2015 0735   CHOL 132 02/02/2013 0853   TRIG 245* 07/16/2015 0735   TRIG 123 02/08/2015 1009   TRIG 172* 02/02/2013 0853   HDL 32* 07/16/2015 0735   HDL 41 02/08/2015 1009   HDL 33* 02/02/2013 0853   CHOLHDL 5.9 07/16/2015 0735   VLDL 49* 07/16/2015 0735   LDLCALC 108* 07/16/2015 0735   LDLCALC 49 05/02/2014 0934   LDLCALC 65 02/02/2013 0853   HgbA1C  Lab Results  Component Value Date   HGBA1C 6.4* 07/16/2015   Cardiac Panel (last 3 results) No results for input(s): CKTOTAL, CKMB, TROPONINI, RELINDX in the last 72 hours. Urinalysis    Component Value Date/Time   BILIRUBINUR neg 05/02/2014 0956   PROTEINUR neg 05/02/2014 0956    UROBILINOGEN negative 05/02/2014 0956  NITRITE neg 05/02/2014 0956   LEUKOCYTESUR moderate (2+) 05/02/2014 0956   Urine Drug Screen No results found for: LABOPIA, COCAINSCRNUR, LABBENZ, AMPHETMU, THCU, LABBARB  Alcohol Level No results found for: [facility]   SIGNIFICANT DIAGNOSTIC STUDIES Ct Head Wo Contrast 07/16/2015 Stable noncontrast CT appearance of the brain since yesterday. No evolving infarct identified. No intracranial hemorrhage or mass effect.  07/15/2015 Atrophy and mild chronic microvascular ischemia. No acute abnormality.   Mri & Mra Head/brain Wo Cm 07/16/2015 Punctate area of restricted diffusion in the region of the genu of the LEFT internal capsule. Within limits for visualization on this truncated exam, no complicating hemorrhage post tPA. Gross patency of the internal carotid artery is established, with special attention to the LEFT anterior circulation. Moderate intracranial atherosclerotic change elsewhere as described.   EEG 1) Generalized irregular slow activity 2) Slow PDR  Carotid Doppler  There is 1-39% bilateral ICA stenosis. Vertebral artery flow is antegrade.     2D Echocardiogram  - Left ventricle: The cavity size was normal. Wall thickness wasnormal. Systolic function was normal. The estimated ejectionfraction was in the range of 60% to 65%. Wall motion was normal;there were no regional wall motion abnormalities. Dopplerparameters are consistent with abnormal left ventricularrelaxation (grade 1 diastolic dysfunction). Doppler parametersare consistent with high ventricular filling pressure. - Aortic valve: Valve area (VTI): 1.68 cm^2. Valve area (Vmax):1.56 cm^2. Valve area (Vmean): 1.33 cm^2. - Mitral valve: Calcified annulus. Mildly thickened leaflets .   HISTORY OF PRESENT ILLNESS [name] is an 81 y.o. male with a past medical history significant for HTN, DM, CAD, MI, TIA, iliac artery aneurysm, brought in by EMS as a code stroke due to  acute onset onset of right hand weakness, right lower face weakness, difficulty talking. Wife is at the bedside and said that they were eating lunch when he began to have trouble using his hands and dropped a cup from his right hand. Then, he started having difficulty expressing himself and she noticed some right face droopiness and thus EMS was summoned. (LKW 07/14/2105 at 1230p). Initial NIHSS 2, with fluctuating non fluent spontaneous language. CT brain was personally reviewed and showed no acute abnormality. Denies HA, vertigo, double vision, focal weakness or numbness, confusion, or vision impairment. Patient takes aggrenox daily. NIHSS 2. Modified Rankin score: 0. Patient was administered TPA 07/14/2105 at 1524. He was admitted to the stroke floor [facility] for further evaluation and treatment.    HOSPITAL COURSE [name] is a 81 y.o. male with history of HTN, DM, CAD, MI, TIA, iliac artery aneurysm presenting with right hand weakness and expressive aphaisa with worsening of aphasia in the ED. He received IV t-PA 07/15/2015 at 1524.   Stroke: L internal capsule infarct s/p IV tPA secondary to small vessel disease   Resultant Expressive aphaisa, unable to repeat (improved)  Repeat CT without acute stroke, acute abnormality  MRI L internal capsule infarct  MRA moderate intracranial atherosclerosis  Carotid Doppler No significant stenosis   2D Echo No source of embolus    EEG without seizures   LDL 108  HgbA1c 6.2 in Oct  dipyridamole SR 250 mg/aspirin 25 mg twice a day prior to admission, placed on aspirin 325 mg daily in the hospital. Change to plavix 75 mg daily at discharge  Ongoing aggressive stroke risk factor management  Therapy recommendations: no therapy needs  Disposition: returm home  Hypertension  Stable  Hyperlipidemia  Home meds: crestor 10 and omega 3  LDL 108,  goal < 70  Continue statin at discharge  Diabetes type II  HgbA1c 6.2 in Oct,  at goal < 7.0  Controlled  Other Stroke Risk Factors  Advanced age  Former Cigarette smoker, quit smoking 30 years ago Hx stroke/TIA  2003 - aphasia, improved [facility])   Coronary artery disease  R ilicac artery aneurysm  Other Active Problems  Chronic kidney disease, Cr 1.3   DISCHARGE EXAM Blood pressure 113/76, pulse 97, temperature 97.6 F (36.4 C), temperature source Oral, resp. rate 20, height 5\' 11"  (1.803 m), weight 91.4 kg (201 lb 8 oz), SpO2 96 %. Pleasant elderly Caucasian male not in distress. . Afebrile. Head is nontraumatic. Neck is supple without bruit. Cardiac exam no murmur or gallop. Lungs are clear to auscultation. Distal pulses are well felt. Neurological Exam :  Awake alert oriented 3. Speech seems quite fluent with only occasional word finding difficulty. He will to comprehend, repeat and name quite well. Extraocular movements are full range without nystagmus. Fundi were not visualized. Vision acuity seems adequate. Pupils equal reactive. Face is symmetric without weakness. Tongue is midline. Motor system exam revealed no upper or lower extremity drift. Symmetric and equal strength in all 4 extremities. No focal weakness. Sensation appears preserved bilaterally. Deep tendon pulses symmetric. Plantars are downgoing. Gait was not tested.   Discharge Diet   Diet Heart Room service appropriate?: Yes; Fluid consistency:: Thin liquids  DISCHARGE PLAN  Disposition:  home   clopidogrel 75 mg daily for secondary stroke prevention.  Follow-up Redge Gainer, MD in 2 weeks.  Follow-up with Dr. Antony Contras, Stroke Clinic in 2 months.  35 minutes were spent preparing discharge.  [place] for Pager information 07/17/2015 3:22 PM   I have personally examined this patient, reviewed notes, independently viewed imaging studies, participated in medical decision making and plan of care. I have made any additions  or clarifications directly to the above note. Agree with note above.    Antony Contras, MD Medical Director [facility] Pager: [phone] 07/17/2015 7:33 PM

## 2015-07-17 NOTE — Evaluation (Signed)
Speech Language Pathology Evaluation Patient Details Name: [name] MRN: [id number] DOB: 27-Apr-1930 Today's Date: 07/17/2015 Time: [id number] SLP Time Calculation (min) (ACUTE ONLY): 17 min  Problem List:  Patient Active Problem List   Diagnosis Date Noted  . Acute right-sided weakness   . Stroke with cerebral ischemia ([place]) 07/15/2015  . CVA (cerebral infarction) 07/15/2015  . History of TIA (transient ischemic attack) 09/12/2013  . Metabolic syndrome 0000000  . Vitamin D deficiency 08/01/2013  . Diabetes ([place]) 08/01/2013  . PVD (peripheral vascular disease) ([place]) 11/25/2011  . ILIAC ARTERY ANEURYSM 07/17/2010  . DYSLIPIDEMIA 08/28/2009  . CORONARY ATHEROSCLEROSIS NATIVE CORONARY ARTERY 08/28/2009  . UPPER RESPIRATORY INFECTION 08/28/2009  . Essential hypertension 06/05/2009  . CEREBRAL ATHEROSCLEROSIS 06/04/2009   Past Medical History:  Past Medical History  Diagnosis Date  . DYSLIPIDEMIA   . HYPERTENSION   . CORONARY ATHEROSCLEROSIS NATIVE CORONARY ARTERY     Myocardial infarction at [facility] in 1996 with a directional atherectomy of the 90% LAD stenosis and medical management of 60-70% proximal circumflex and 50% RCA stenosis.  . Cerebral atherosclerosis   . UPPER RESPIRATORY INFECTION   . ILIAC ARTERY ANEURYSM   . Diabetes mellitus without complication ([place])   . Arthritis   . Asthma   . TIA (transient ischemic attack)   . Myocardial infarction [facility]) 1996   Past Surgical History:  Past Surgical History  Procedure Laterality Date  . Intraocular lens insertion    . Right knee surgery    . Repair of rectal fistula     HPI:  Admitted with Right sided weakness, facial droop, and decreased communication. CT negative and given tPA. Speech symptoms have come/gone since admitted. PMHx- previous similar episodes (CVA never confirmed), HTN, DM, CAD, MI, bil knee OA    Assessment / Plan / Recommendation Clinical Impression  Patient appears to be at his baseline  level of functioning. Patient was 100% intelligible and oral-motor musculature appears WFL for strength and ROM. Patient able to follow multi-step commands and verbalize at the conversation level without difficulty. Both the patient and his family report his speech and language are back to baseline, therefore, skilled SLP intervention is not warranted at this time.     SLP Assessment  Patient does not need any further Speech Lanaguage Pathology Services    Follow Up Recommendations  None    Frequency and Duration N/A       SLP Evaluation Prior Functioning  Cognitive/Linguistic Baseline: Within functional limits Type of Home: House Available Help at Discharge: Family;Available 24 hours/day   Cognition  Overall Cognitive Status: Within Functional Limits for tasks assessed Arousal/Alertness: Awake/alert Orientation Level: Oriented X4 Attention: Selective Selective Attention: Appears intact Memory: Appears intact Awareness: Appears intact Problem Solving: Appears intact Safety/Judgment: Appears intact    Comprehension  Auditory Comprehension Overall Auditory Comprehension: Appears within functional limits for tasks assessed Visual Recognition/Discrimination Discrimination: Within Function Limits Reading Comprehension Reading Status: Not tested    Expression Expression Primary Mode of Expression: Verbal Verbal Expression Overall Verbal Expression: Appears within functional limits for tasks assessed Written Expression Dominant Hand: Left Written Expression: Not tested   Oral / Motor Oral Motor/Sensory Function Overall Oral Motor/Sensory Function: Within functional limits Motor Speech Overall Motor Speech: Appears within functional limits for tasks assessed    Diron Haddon 07/17/2015, 1:51 PM

## 2015-07-17 NOTE — Progress Notes (Signed)
  Echocardiogram 2D Echocardiogram has been performed.  Joelene Millin 07/17/2015, 1:11 PM

## 2015-07-17 NOTE — Progress Notes (Signed)
Pt noted ambulating in the hallway with tech standby assist. Gait steady. No note distress. Pt denies pain or discomfort. Will continue to monitor.

## 2015-07-17 NOTE — Progress Notes (Signed)
Pt discharging at this time home with family. Pt alert, verbal taking all personal belongings. IV discontinued, dry dressing applied. Discharge instructions provided with verbal understanding. Pt made aware of follow up appt. No noted distress. Pt denies pain or discomfort.

## 2015-07-17 NOTE — Evaluation (Signed)
Occupational Therapy Evaluation Patient Details Name: ANJAY KOKAL MRN: [id number] DOB: 1930/02/12 Today's Date: 07/17/2015    History of Present Illness Adm with Rt sided weakness, facial droop, and decr communication. CT negative and given tPA. Speech symptoms have come/gone since admitted. PMHx- previous similar episodes (CVA never confirmed), HTN, DM, CAD, MI, bil knee OA   Clinical Impression   Pt admitted with above and evaluated by Occupational Therapy with no further acute OT needs identified.  Wife present and confirm pt is at baseline for mobility and self-care tasks.  Performed toilet transfer and simulated LB dressing with pt reporting all back to baseline.  All education has been completed with pt and wife and both report no further questions.  OT is signing off.    Follow Up Recommendations  No OT follow up    Equipment Recommendations  None recommended by OT    Recommendations for Other Services       Precautions / Restrictions Precautions Precautions: None      Mobility  Transfers Overall transfer level: Independent Equipment used: None                       ADL Overall ADL's : Independent;At baseline                                             Vision Vision Assessment?: No apparent visual deficits   Perception Perception Perception Tested?: Yes Comments: WFL   Praxis Praxis Praxis tested?: Within functional limits    Pertinent Vitals/Pain Pain Assessment: No/denies pain     Hand Dominance Left   Extremity/Trunk Assessment Upper Extremity Assessment Upper Extremity Assessment: Overall WFL for tasks assessed   Lower Extremity Assessment Lower Extremity Assessment: Overall WFL for tasks assessed   Cervical / Trunk Assessment Cervical / Trunk Assessment: Normal   Communication Communication Communication: No difficulties   Cognition Arousal/Alertness: Awake/alert Behavior During Therapy: WFL for tasks  assessed/performed Overall Cognitive Status: Within Functional Limits for tasks assessed                      Home Living Family/patient expects to be discharged to:: Private residence Living Arrangements: Spouse/significant other Available Help at Discharge: Family;Available 24 hours/day Type of Home: House Home Access: Stairs to enter [employer] of Steps: 1+1 Entrance Stairs-Rails: None (post along deck) Home Layout: Multi-level Alternate Level Stairs-Number of Steps: 2 (between kitchen and living room) Alternate Level Stairs-Rails: None Bathroom Shower/Tub: Tub/shower unit Shower/tub characteristics: Architectural technologist: Standard     Home Equipment: Radio producer - single point;Crutches          Prior Functioning/Environment Level of Independence: Independent with assistive device(s)        Comments: uses cane when walking on property    OT Diagnosis:  RUE weakness   OT Problem List:     OT Treatment/Interventions:      OT Goals(Current goals can be found in the care plan section) Acute Rehab OT Goals Patient Stated Goal: go home OT Goal Formulation: All assessment and education complete, DC therapy  OT Frequency:     Barriers to D/C:  None             End of Session    Activity Tolerance: Patient tolerated treatment well;No increased pain Patient left: in chair;with call bell/phone within reach;with family/visitor present  [id number] OT Time Calculation (min): 9 min Charges:  OT General Charges $OT Visit: 1 Procedure OT Evaluation $Initial OT Evaluation Tier I: 1 Procedure G-CodesSimonne Come, [id number] 07/17/2015, 1:33 PM

## 2015-07-19 ENCOUNTER — Telehealth: Payer: Self-pay | Admitting: *Deleted

## 2015-07-19 NOTE — Telephone Encounter (Signed)
Call Completed and Appointment Scheduled: Yes, Date: 07/26/15 Dr Wendi Snipes   DISCHARGE INFORMATION Date of Discharge:07/17/15   Discharge Facility: [facility]   Principal Discharge Diagnosis: Cerebral infarction  Patient and/or caregiver is knowledgeable of his/her condition(s) and treatment: Yes   MEDICATION RECONCILIATION Medication list reviewed with patient: Yes  Patient is able to obtain needed medications: Yes   ACTIVITIES OF DAILY LIVING  Is the patient able to perform his/her own ADLs: Yes  Patient is receiving home health services: no   PATIENT EDUCATION Questions/Concerns Discussed: Spoke with patient's wife. He has had some difficulty sleeping. Believes it due to being worried about his health. He did sleep better last night though. They did not have any other questions or concerns but will call back if any come up.

## 2015-07-26 ENCOUNTER — Ambulatory Visit (INDEPENDENT_AMBULATORY_CARE_PROVIDER_SITE_OTHER): Payer: Medicare Other | Admitting: Family Medicine

## 2015-07-26 ENCOUNTER — Encounter: Payer: Self-pay | Admitting: Family Medicine

## 2015-07-26 VITALS — BP 131/78 | HR 69 | Temp 97.0°F | Ht 71.0 in | Wt 196.4 lb

## 2015-07-26 DIAGNOSIS — I639 Cerebral infarction, unspecified: Secondary | ICD-10-CM | POA: Diagnosis not present

## 2015-07-26 DIAGNOSIS — N183 Chronic kidney disease, stage 3 unspecified: Secondary | ICD-10-CM

## 2015-07-26 NOTE — Patient Instructions (Addendum)
Great to see you!  The medicines are up to date,. If you have questions feel free to call  Avoid NSAIDs- like Meloxicam, advil/motrin, or aleve  Try tylenol 1-2 tabs 3 times daily as needed instead  We will call with your results within 1 week.

## 2015-07-26 NOTE — Progress Notes (Signed)
   HPI  Patient presents today for hospital follow-up in the transitional care clinic.  He was discharged on December 14 after being admitted for acute CVA. He was treated with TPA and had resolution of expressive aphasia.  Since being home he has no more problems concerning for stroke. He has developed a cough with a runny nose over the last 1 day, however he has no fever, malaise, shortness of breath, or chest pain.  He has a few questions about medications, we have reviewed them in detail and updated his medication list.  He denies any continued weakness, numbness or tingling, or signs of the stroke.  PMH: Smoking status noted ROS: Per HPI  Objective: BP 131/78 mmHg  Pulse 69  Temp(Src) 97 F (36.1 C) (Oral)  Ht '5\' 11"'$  (1.803 m)  Wt 196 lb 6.4 oz (89.086 kg)  BMI 27.40 kg/m2 Gen: NAD, alert, cooperative with exam HEENT: NCAT, EOMI, PERRL, TMs obscured by cerumen bilaterally, nares with swelling and erythema, oropharynx clear CV: RRR, good S1/S2, no murmur Resp: CTABL, no wheezes, non-labored Ext: No edema, warm Neuro: Alert and oriented, strength 5/5 and sensation intact in bilateral upper and lower extremities, cranial nerves II through XII intact except for slight rightward tongue deviation No signs of expressive aphasia that were present previously  Assessment and plan:  # Acute CVA Antiplatelet treatment has been escalated to Plavix Continue statin Continue ACE inhibitor  # Hypertension Discontinue amlodipine, he is Arty done this and his blood pressures well controlled Continue HCTZ and ACE inhibitor  # Hyperlipidemia Continue Crestor, discontinuing the niacin  # CKD stage III Labs today CMP, vitamin D Avoid NSAIDs   Orders Placed This Encounter  Procedures  . CMP14+EGFR  . VITAMIN D 25 Hydroxy (Vit-D Deficiency, Fractures)    Meds ordered this encounter  Medications  . DISCONTD: meloxicam (MOBIC) 15 MG tablet    Sig: Take 1 tablet by mouth as  needed.  . Meclizine HCl 25 MG CHEW    Sig: Chew 1 tablet by mouth as needed.    Laroy Apple, MD [facility] 07/26/2015, 9:01 AM

## 2015-07-27 LAB — CMP14+EGFR
A/G RATIO: 1.9 (ref 1.1–2.5)
ALT: 14 IU/L (ref 0–44)
AST: 14 IU/L (ref 0–40)
Albumin: 4.8 g/dL — ABNORMAL HIGH (ref 3.5–4.7)
Alkaline Phosphatase: 95 IU/L (ref 39–117)
BILIRUBIN TOTAL: 0.7 mg/dL (ref 0.0–1.2)
BUN/Creatinine Ratio: 11 (ref 10–22)
BUN: 13 mg/dL (ref 8–27)
CHLORIDE: 97 mmol/L (ref 96–106)
CO2: 24 mmol/L (ref 18–29)
Calcium: 9.7 mg/dL (ref 8.6–10.2)
Creatinine, Ser: 1.22 mg/dL (ref 0.76–1.27)
GFR calc non Af Amer: 54 mL/min/{1.73_m2} — ABNORMAL LOW (ref >59)
GFR, EST AFRICAN AMERICAN: 63 mL/min/{1.73_m2} (ref >59)
Globulin, Total: 2.5 g/dL (ref 1.5–4.5)
Glucose: 156 mg/dL — ABNORMAL HIGH (ref 65–99)
POTASSIUM: 4.8 mmol/L (ref 3.5–5.2)
Sodium: 138 mmol/L (ref 134–144)
TOTAL PROTEIN: 7.3 g/dL (ref 6.0–8.5)

## 2015-07-27 LAB — VITAMIN D 25 HYDROXY (VIT D DEFICIENCY, FRACTURES): Vit D, 25-Hydroxy: 26.8 ng/mL — ABNORMAL LOW (ref 30.0–100.0)

## 2015-07-28 ENCOUNTER — Other Ambulatory Visit: Payer: Self-pay | Admitting: Family Medicine

## 2015-07-28 MED ORDER — VITAMIN D3 1.25 MG (50000 UT) PO CAPS: 1.0000 | ORAL_CAPSULE | ORAL | Status: DC

## 2015-08-20 DIAGNOSIS — Z885 Allergy status to narcotic agent status: Secondary | ICD-10-CM | POA: Diagnosis not present

## 2015-08-20 DIAGNOSIS — Z87891 Personal history of nicotine dependence: Secondary | ICD-10-CM | POA: Diagnosis not present

## 2015-08-20 DIAGNOSIS — J45909 Unspecified asthma, uncomplicated: Secondary | ICD-10-CM | POA: Diagnosis not present

## 2015-08-20 DIAGNOSIS — J343 Hypertrophy of nasal turbinates: Secondary | ICD-10-CM | POA: Diagnosis not present

## 2015-08-20 DIAGNOSIS — Z7982 Long term (current) use of aspirin: Secondary | ICD-10-CM | POA: Diagnosis not present

## 2015-08-20 DIAGNOSIS — H6123 Impacted cerumen, bilateral: Secondary | ICD-10-CM | POA: Diagnosis not present

## 2015-08-20 DIAGNOSIS — Z888 Allergy status to other drugs, medicaments and biological substances status: Secondary | ICD-10-CM | POA: Diagnosis not present

## 2015-08-20 DIAGNOSIS — Z7984 Long term (current) use of oral hypoglycemic drugs: Secondary | ICD-10-CM | POA: Diagnosis not present

## 2015-08-20 DIAGNOSIS — J320 Chronic maxillary sinusitis: Secondary | ICD-10-CM | POA: Diagnosis not present

## 2015-08-20 DIAGNOSIS — Z79899 Other long term (current) drug therapy: Secondary | ICD-10-CM | POA: Diagnosis not present

## 2015-08-20 DIAGNOSIS — I2510 Atherosclerotic heart disease of native coronary artery without angina pectoris: Secondary | ICD-10-CM | POA: Diagnosis not present

## 2015-08-20 DIAGNOSIS — E119 Type 2 diabetes mellitus without complications: Secondary | ICD-10-CM | POA: Diagnosis not present

## 2015-08-20 DIAGNOSIS — J329 Chronic sinusitis, unspecified: Secondary | ICD-10-CM | POA: Diagnosis not present

## 2015-08-20 DIAGNOSIS — J342 Deviated nasal septum: Secondary | ICD-10-CM | POA: Diagnosis not present

## 2015-08-20 DIAGNOSIS — I10 Essential (primary) hypertension: Secondary | ICD-10-CM | POA: Diagnosis not present

## 2015-08-27 ENCOUNTER — Encounter: Payer: Self-pay | Admitting: Family Medicine

## 2015-08-27 ENCOUNTER — Ambulatory Visit (INDEPENDENT_AMBULATORY_CARE_PROVIDER_SITE_OTHER): Payer: Medicare Other | Admitting: Family Medicine

## 2015-08-27 VITALS — BP 136/84 | HR 81 | Temp 95.6°F | Ht 71.0 in | Wt 195.8 lb

## 2015-08-27 DIAGNOSIS — J45901 Unspecified asthma with (acute) exacerbation: Secondary | ICD-10-CM

## 2015-08-27 MED ORDER — PREDNISONE 20 MG PO TABS: 40.0000 mg | ORAL_TABLET | Freq: Every day | ORAL | Status: DC

## 2015-08-27 NOTE — Patient Instructions (Signed)
Great to see you!  Lets see you back in April to talk about diabetes and blood pressure  Try the prednisone 2 pills once daily for 5 days to see if it can help you over the cough you have.

## 2015-08-27 NOTE — Progress Notes (Signed)
   HPI  Patient presents today to discuss cough and cold and for follow-up.  Patient was admitted to the hospital in December with an acute CVA. He's doing well since that time.  He explained the last month or so he's had cough, nasal congestion, bilateral back pain that hurts worse with deep inspiration or cough. His back pain seems to be getting better and his cough seems to be slightly getting better However recently he's been using his albuterol inhaler more than usual and had some wheezing at night.  He's tolerating his cholesterol medicines easily. He does not have much of an appetite and so as not watching it very closely.  His blood sugars on average her fasting 130 to 140 Taking metformin daily    PMH: Smoking status noted ROS: Per HPI  Objective: BP 136/84 mmHg  Pulse 81  Temp(Src) 95.6 F (35.3 C) (Oral)  Ht 5\' 11"  (1.803 m)  Wt 195 lb 12.8 oz (88.814 kg)  BMI 27.32 kg/m2 Gen: NAD, alert, cooperative with exam HEENT: NCAT, nares with some swelling bilaterally, TMs normal bilaterally, MMM, oropharynx clear CV: RRR, good S1/S2, no murmur Resp: CTABL, no wheezes, non-labored, cough intermittently Ext: No edema, warm Neuro: Alert and oriented  Assessment and plan:  # Asthma exacerbation No signs of bacterial infection, prednisone burst Discussed appropriate albuterol use, he is doing well with this,  # Status post CVA  No residual effects, doing well, no concerning symptoms. Tolerating Lipitor well Also on ACE inhibitor and Plavix  # Hypertension Doing well, controlled Continue metoprolol and HCTZ and benazepril If respiratory symptoms get worse consider changing to bisoprolol  # Hyperlipidemia Goal of LDL less than 70 with previous CVA Continue Lipitor, recheck next visit   Meds ordered this encounter  Medications  . predniSONE (DELTASONE) 20 MG tablet    Sig: Take 2 tablets (40 mg total) by mouth daily with breakfast.    Dispense:  10 tablet   Refill:  0    Laroy Apple, MD [facility] 08/27/2015, 8:56 AM

## 2015-09-19 ENCOUNTER — Other Ambulatory Visit: Payer: Self-pay | Admitting: Cardiology

## 2015-09-19 DIAGNOSIS — I714 Abdominal aortic aneurysm, without rupture, unspecified: Secondary | ICD-10-CM

## 2015-09-25 ENCOUNTER — Other Ambulatory Visit: Payer: Self-pay | Admitting: Family Medicine

## 2015-09-30 ENCOUNTER — Encounter: Payer: Self-pay | Admitting: Neurology

## 2015-09-30 ENCOUNTER — Ambulatory Visit (INDEPENDENT_AMBULATORY_CARE_PROVIDER_SITE_OTHER): Payer: Medicare Other | Admitting: Neurology

## 2015-09-30 VITALS — BP 124/78 | HR 76 | Ht 71.0 in | Wt 199.2 lb

## 2015-09-30 DIAGNOSIS — I6381 Other cerebral infarction due to occlusion or stenosis of small artery: Secondary | ICD-10-CM

## 2015-09-30 DIAGNOSIS — I639 Cerebral infarction, unspecified: Secondary | ICD-10-CM | POA: Diagnosis not present

## 2015-09-30 NOTE — Progress Notes (Signed)
[facility] [address] [place]. [place] [address] [phone]       OFFICE FOLLOW-UP NOTE  [name] Date of Birth:  March 07, 1930 Medical Record Number:  [id number]   HPI: 60 year Caucasian male seen today for first office follow-up visit following hospital admission for stroke in December 2016. [name] is an 82 y.o. male with a past medical history significant for HTN, DM, CAD, MI, TIA, iliac artery aneurysm, brought in by EMS as a code stroke due to acute onset onset of right hand weakness, right lower face weakness, difficulty talking. Wife is at the bedside and said that they were eating lunch when he began to have trouble using his hands and dropped a cup from his right hand. Then, he started having difficulty expressing himself and she noticed some right face droopiness and thus EMS was summoned. ([facility] 07/14/2105 at 1230p). Initial NIHSS 2, with fluctuating non fluent spontaneous language. CT brain was personally reviewed and showed no acute abnormality. Denies HA, vertigo, double vision, focal weakness or numbness, confusion, or vision impairment. Patient takes aggrenox daily. NIHSS 2. Modified Rankin score: 0. Patient was administered TPA 07/14/2105 at 1524. He was admitted to the stroke floor for further evaluation and treatment. CT scan of the head on admission showed no acute abnormality. He was administered IV tPA uneventfully without any complications and showed significant clinical improvement. MRI scan of the brain showed small left internal capsule infarct. MRA of the brain showed moderate changes of indeterminate atherosclerosis but the left middle cerebral artery was wide open. Transthoracic echo showed normal ejection fraction without cardiac source of embolism. EEG showed no seizure activity. LDL cholesterol was marginally elevated at 108. Hemoglobin A1c in October 2016 was 6.2. Patient did well and was seen by physical occupational therapy and had  no therapy needs and was returned home and is tolerating Plavix well without bleeding bruising or other side effects. He states his blood pressure is doing good and today it was 124/78 in office. His fasting sugars are slightly high in the 130s range. He is eating healthy and does walk a lot. He has gradually lost 20-30 pounds over the last 2 years. He had lab work done by his primary physician last week which was apparently fine but I do not have those results. He has no complaints today. ROS:   14 system review of systems is positive for feeling cold, easy bruising, joint pain and all other systems negative  PMH:  Past Medical History  Diagnosis Date  . DYSLIPIDEMIA   . HYPERTENSION   . CORONARY ATHEROSCLEROSIS NATIVE CORONARY ARTERY     Myocardial infarction at [facility] in 1996 with a directional atherectomy of the 90% LAD stenosis and medical management of 60-70% proximal circumflex and 50% RCA stenosis.  . Cerebral atherosclerosis   . UPPER RESPIRATORY INFECTION   . ILIAC ARTERY ANEURYSM   . Diabetes mellitus without complication ([place])   . Arthritis   . Asthma   . TIA (transient ischemic attack)   . Myocardial infarction ([place]) 1996  . Stroke ([place])   . History of rotator cuff surgery     Social History:  Social History   Social History  . Marital Status: Married    Spouse Name: N/A  . Number of Children: N/A  . Years of Education: N/A   Occupational History  . 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 Smoking status:  Former Smoker    Quit date: 11/24/1984  . Smokeless tobacco: Never Used  . Alcohol Use: No  . Drug Use: No  . Sexual Activity: Yes   Other Topics Concern  . Not on file   Social History Narrative    Medications:   Current Outpatient Prescriptions on File Prior to Visit  Medication Sig Dispense Refill  . albuterol (PROAIR HFA) 108 (90 BASE) MCG/ACT inhaler INHALE TWO PUFFS INTO LUNGS 4 TIMES DAILY AS NEEDED 54 each 3  .  benazepril (LOTENSIN) 20 MG tablet Take 1 tablet (20 mg total) by mouth daily. 90 tablet 3  . Cholecalciferol (VITAMIN D3) 50000 UNITS CAPS Take 1 capsule by mouth once a week. 12 capsule 3  . clopidogrel (PLAVIX) 75 MG tablet Take 1 tablet (75 mg total) by mouth daily. 30 tablet 2  . fish oil-omega-3 fatty acids 1000 MG capsule Take 2 g by mouth daily.      . Fluticasone-Salmeterol (ADVAIR DISKUS) 100-50 MCG/DOSE AEPB INHALE ONE DOSE BY MOUTH EVERY 12 HOURS AS NEEDED 180 each 3  . hydrochlorothiazide (HYDRODIURIL) 25 MG tablet TAKE ONE-HALF TABLET BY MOUTH ONCE DAILY EXCEPT  ON  MONDAY,  WEDNESDAY  AND  FRIDAY  TAKE  A  WHOLE  TABLET 90 tablet 3  . metFORMIN (GLUCOPHAGE-XR) 500 MG 24 hr tablet Take 1 tablet (500 mg total) by mouth 2 (two) times daily. 180 tablet 3  . metoprolol (LOPRESSOR) 100 MG tablet Take 1 tablet (100 mg total) by mouth 2 (two) times daily. 180 tablet 3  . NITROSTAT 0.4 MG SL tablet USE AS DIRECTED 25 tablet 1  . rosuvastatin (CRESTOR) 10 MG tablet Take 1 tablet (10 mg total) by mouth daily. 90 tablet 3   No current facility-administered medications on file prior to visit.    Allergies:   Allergies  Allergen Reactions  . Codeine Other (See Comments)    Bad dreams  . Lopid [Gemfibrozil] Nausea Only  . Voltaren [Diclofenac Sodium] Diarrhea    Physical Exam General: well developed, well nourished elderly Caucasian male, seated, in no evident distress Head: head normocephalic and atraumatic.  Neck: supple with no carotid or supraclavicular bruits Cardiovascular: regular rate and rhythm, no murmurs Musculoskeletal: no deformity Skin:  no rash/petichiae Vascular:  Normal pulses all extremities Filed Vitals:   09/30/15 0912  BP: 124/78  Pulse: 76   Neurologic Exam Mental Status: Awake and fully alert. Oriented to place and time. Recent and remote memory intact. Attention span, concentration and fund of knowledge appropriate. Mood and affect appropriate.  Cranial  Nerves: Fundoscopic exam reveals sharp disc margins. Pupils equal, briskly reactive to light. Extraocular movements full without nystagmus. Visual fields full to confrontation. Hearing intact. Facial sensation intact. Face, tongue, palate moves normally and symmetrically.  Motor: Normal bulk and tone. Normal strength in all tested extremity muscles.Diminished fine finger movements on the right. Orbits left over right upper extremity. Sensory.: intact to touch ,pinprick .position and vibratory sensation.  Coordination: Rapid alternating movements normal in all extremities. Finger-to-nose and heel-to-shin performed accurately bilaterally. Gait and Station: Arises from chair without difficulty. Stance is normal. Gait demonstrates normal stride length and balance . Able to heel, toe and tandem walk without difficulty.  Reflexes: 1+ and symmetric. Toes downgoing.   NIHSS  0 Modified Rankin  1   ASSESSMENT: 25 year patient with small left internal capsule infarct secondary to small vessel disease in December 2017 treated with IV TPA with full functional recovery. Vascular risk factors  of diabetes, hyperlipidemia, age and sex.    PLAN: I had a long d/w patient and his wife about his recent stroke, risk for recurrent stroke/TIAs, personally independently reviewed imaging studies and stroke evaluation results and answered questions.Continue Plavix  for secondary stroke prevention and maintain strict control of hypertension with blood pressure goal below 130/90, diabetes with hemoglobin A1c goal below 6.5% and lipids with LDL cholesterol goal below 70 mg/dL. I also advised the patient to eat a healthy diet with plenty of whole grains, cereals, fruits and vegetables, exercise regularly and maintain ideal body weight Greater than 50% of time during this 25 minute visit was spent on counseling,explanation of diagnosis, planning of further management, discussion with patient and family and coordination of  care.Followup in the future with stroke NP in 6 months or call earlier if necessary.  Antony Contras, MD Note: This document was prepared with digital dictation and possible smart phrase technology. Any transcriptional errors that result from this process are unintentional

## 2015-09-30 NOTE — Patient Instructions (Signed)
I had a long d/w patient and his wife about his recent stroke, risk for recurrent stroke/TIAs, personally independently reviewed imaging studies and stroke evaluation results and answered questions.Continue Plavix  for secondary stroke prevention and maintain strict control of hypertension with blood pressure goal below 130/90, diabetes with hemoglobin A1c goal below 6.5% and lipids with LDL cholesterol goal below 70 mg/dL. I also advised the patient to eat a healthy diet with plenty of whole grains, cereals, fruits and vegetables, exercise regularly and maintain ideal body weight Followup in the future with stroke NP in 6 months or call earlier if necessary. Stroke Prevention Some medical conditions and behaviors are associated with an increased chance of having a stroke. You may prevent a stroke by making healthy choices and managing medical conditions. HOW CAN I REDUCE MY RISK OF HAVING A STROKE?   Stay physically active. Get at least 30 minutes of activity on most or all days.  Do not smoke. It may also be helpful to avoid exposure to secondhand smoke.  Limit alcohol use. Moderate alcohol use is considered to be:  No more than 2 drinks per day for men.  No more than 1 drink per day for nonpregnant women.  Eat healthy foods. This involves:  Eating 5 or more servings of fruits and vegetables a day.  Making dietary changes that address high blood pressure (hypertension), high cholesterol, diabetes, or obesity.  Manage your cholesterol levels.  Making food choices that are high in fiber and low in saturated fat, trans fat, and cholesterol may control cholesterol levels.  Take any prescribed medicines to control cholesterol as directed by your health care provider.  Manage your diabetes.  Controlling your carbohydrate and sugar intake is recommended to manage diabetes.  Take any prescribed medicines to control diabetes as directed by your health care provider.  Control your  hypertension.  Making food choices that are low in salt (sodium), saturated fat, trans fat, and cholesterol is recommended to manage hypertension.  Ask your health care provider if you need treatment to lower your blood pressure. Take any prescribed medicines to control hypertension as directed by your health care provider.  If you are 44-44 years of age, have your blood pressure checked every 3-5 years. If you are 45 years of age or older, have your blood pressure checked every year.  Maintain a healthy weight.  Reducing calorie intake and making food choices that are low in sodium, saturated fat, trans fat, and cholesterol are recommended to manage weight.  Stop drug abuse.  Avoid taking birth control pills.  Talk to your health care provider about the risks of taking birth control pills if you are over 62 years old, smoke, get migraines, or have ever had a blood clot.  Get evaluated for sleep disorders (sleep apnea).  Talk to your health care provider about getting a sleep evaluation if you snore a lot or have excessive sleepiness.  Take medicines only as directed by your health care provider.  For some people, aspirin or blood thinners (anticoagulants) are helpful in reducing the risk of forming abnormal blood clots that can lead to stroke. If you have the irregular heart rhythm of atrial fibrillation, you should be on a blood thinner unless there is a good reason you cannot take them.  Understand all your medicine instructions.  Make sure that other conditions (such as anemia or atherosclerosis) are addressed. SEEK IMMEDIATE MEDICAL CARE IF:   You have sudden weakness or numbness of the face, arm,  or leg, especially on one side of the body.  Your face or eyelid droops to one side.  You have sudden confusion.  You have trouble speaking (aphasia) or understanding.  You have sudden trouble seeing in one or both eyes.  You have sudden trouble walking.  You have  dizziness.  You have a loss of balance or coordination.  You have a sudden, severe headache with no known cause.  You have new chest pain or an irregular heartbeat. Any of these symptoms may represent a serious problem that is an emergency. Do not wait to see if the symptoms will go away. Get medical help at once. Call your local emergency services (911 in [place] Do not drive yourself to the hospital.   This information is not intended to replace advice given to you by your health care provider. Make sure you discuss any questions you have with your health care provider.   Document Released: 08/27/2004 Document Revised: 08/10/2014 Document Reviewed: 01/20/2013 Elsevier Interactive Patient Education [employer].

## 2015-10-01 ENCOUNTER — Ambulatory Visit (HOSPITAL_COMMUNITY)
Admission: RE | Admit: 2015-10-01 | Discharge: 2015-10-01 | Disposition: A | Discharge status: Home / Self Care | Payer: Medicare Other | Source: Ambulatory Visit | Attending: Urology | Admitting: Urology

## 2015-10-01 DIAGNOSIS — I708 Atherosclerosis of other arteries: Secondary | ICD-10-CM | POA: Diagnosis not present

## 2015-10-01 DIAGNOSIS — I10 Essential (primary) hypertension: Secondary | ICD-10-CM | POA: Insufficient documentation

## 2015-10-01 DIAGNOSIS — E119 Type 2 diabetes mellitus without complications: Secondary | ICD-10-CM | POA: Diagnosis not present

## 2015-10-01 DIAGNOSIS — E785 Hyperlipidemia, unspecified: Secondary | ICD-10-CM | POA: Insufficient documentation

## 2015-10-01 DIAGNOSIS — I700 Atherosclerosis of aorta: Secondary | ICD-10-CM | POA: Insufficient documentation

## 2015-10-01 DIAGNOSIS — I714 Abdominal aortic aneurysm, without rupture, unspecified: Secondary | ICD-10-CM

## 2015-10-07 ENCOUNTER — Telehealth: Payer: Self-pay | Admitting: Cardiology

## 2015-10-07 NOTE — Telephone Encounter (Signed)
Pt returning call from today,he did not know who it was.

## 2015-10-07 NOTE — Telephone Encounter (Signed)
Results given, pt verbalized understanding.

## 2015-10-12 ENCOUNTER — Other Ambulatory Visit: Payer: Self-pay | Admitting: Family Medicine

## 2015-10-14 MED ORDER — CLOPIDOGREL BISULFATE 75 MG PO TABS: 75.0000 mg | ORAL_TABLET | Freq: Every day | ORAL | Status: DC

## 2015-10-14 NOTE — Telephone Encounter (Signed)
done

## 2015-10-17 NOTE — Patient Outreach (Signed)
[facility]) Care Management  10/17/2015  [name] May 28, 1930 [id number]   Per physician office visit note on 09/30/15 with Dr. Leonie Man, mRS = [place], [place] Management Assistant

## 2015-10-21 ENCOUNTER — Other Ambulatory Visit: Payer: Self-pay | Admitting: Dermatology

## 2015-10-21 DIAGNOSIS — C44329 Squamous cell carcinoma of skin of other parts of face: Secondary | ICD-10-CM | POA: Diagnosis not present

## 2015-10-21 DIAGNOSIS — C44629 Squamous cell carcinoma of skin of left upper limb, including shoulder: Secondary | ICD-10-CM | POA: Diagnosis not present

## 2015-10-21 DIAGNOSIS — C44319 Basal cell carcinoma of skin of other parts of face: Secondary | ICD-10-CM | POA: Diagnosis not present

## 2015-10-23 ENCOUNTER — Inpatient Hospital Stay (HOSPITAL_COMMUNITY): Admission: RE | Admit: 2015-10-23 | Payer: Medicare Other | Source: Ambulatory Visit

## 2015-11-26 ENCOUNTER — Ambulatory Visit (INDEPENDENT_AMBULATORY_CARE_PROVIDER_SITE_OTHER): Payer: Medicare Other | Admitting: Family Medicine

## 2015-11-26 ENCOUNTER — Encounter: Payer: Self-pay | Admitting: Family Medicine

## 2015-11-26 VITALS — BP 133/74 | HR 61 | Temp 96.7°F | Ht 71.0 in | Wt 193.0 lb

## 2015-11-26 DIAGNOSIS — E118 Type 2 diabetes mellitus with unspecified complications: Secondary | ICD-10-CM

## 2015-11-26 DIAGNOSIS — I10 Essential (primary) hypertension: Secondary | ICD-10-CM | POA: Diagnosis not present

## 2015-11-26 DIAGNOSIS — R12 Heartburn: Secondary | ICD-10-CM

## 2015-11-26 LAB — BAYER DCA HB A1C WAIVED: HB A1C: 6.3 % (ref <7.0)

## 2015-11-26 NOTE — Progress Notes (Signed)
   HPI  Patient presents today here to follow-up for diabetes, back pain, and GERD.  Diabetes Fasting blood sugars 140-160, postprandials 120-150, he's noticed with exercise he has a decrease in blood sugar. Get metformin compliance Not really watch his diet, however does not have much of an appetite.  Back pain Describes bilateral flank pain when standing more than 10 minutes at a time. Also has bilateral knee pain No sciatica, no increased pain with twisting or bending.  GERD or heartburn Has stopped PPI due to concerns about interaction with Plavix. Using  tums or Rolaids for very infrequent heartburn symptoms. Has symptoms after eating spicy foods  PMH: Smoking status noted ROS: Per HPI  Objective: BP 133/74 mmHg  Pulse 61  Temp(Src) 96.7 F (35.9 C) (Oral)  Ht 5\' 11"  (1.803 m)  Wt 193 lb (87.544 kg)  BMI 26.93 kg/m2 Gen: NAD, alert, cooperative with exam HEENT: NCAT CV: RRR, good S1/S2, no murmur Resp: CTABL, no wheezes, non-labored Ext: No edema, warm Neuro: Alert and oriented, No gross deficits  Assessment and plan:  # Type 2 diabetes Well controlled Continue metformin Last GFR was 57, monitor every 6 months. Labs in July  # Hypertension Well controlled Continue benazepril, and metoprolol.  # Heartburn Mild, his cast as needed Tums or Rolaids, persistent start Pepcid agree with minimizing Mitchell, MD [facility] 11/26/2015, 8:55 AM

## 2015-11-26 NOTE — Patient Instructions (Signed)
Great to see you!  Consider pepcid or zantac for heartburn if he has persistent symptoms  Lets see him back in 3 months  His A1C is very well controlled, keep up the good work!

## 2015-12-02 HISTORY — PX: BASAL CELL CARCINOMA EXCISION: SHX1214

## 2015-12-05 ENCOUNTER — Emergency Department (HOSPITAL_COMMUNITY): Payer: Medicare Other

## 2015-12-05 ENCOUNTER — Observation Stay (HOSPITAL_COMMUNITY): Payer: Medicare Other

## 2015-12-05 ENCOUNTER — Other Ambulatory Visit: Payer: Self-pay | Admitting: Dermatology

## 2015-12-05 ENCOUNTER — Observation Stay (HOSPITAL_COMMUNITY)
Admission: EM | Admit: 2015-12-05 | Discharge: 2015-12-06 | Disposition: A | Discharge status: Home / Self Care | Payer: Medicare Other | Attending: Internal Medicine | Admitting: Internal Medicine

## 2015-12-05 ENCOUNTER — Encounter (HOSPITAL_COMMUNITY): Payer: Self-pay

## 2015-12-05 DIAGNOSIS — Z9841 Cataract extraction status, right eye: Secondary | ICD-10-CM | POA: Insufficient documentation

## 2015-12-05 DIAGNOSIS — Z823 Family history of stroke: Secondary | ICD-10-CM | POA: Diagnosis not present

## 2015-12-05 DIAGNOSIS — E119 Type 2 diabetes mellitus without complications: Secondary | ICD-10-CM | POA: Insufficient documentation

## 2015-12-05 DIAGNOSIS — R4702 Dysphasia: Secondary | ICD-10-CM | POA: Diagnosis not present

## 2015-12-05 DIAGNOSIS — E870 Hyperosmolality and hypernatremia: Secondary | ICD-10-CM | POA: Diagnosis not present

## 2015-12-05 DIAGNOSIS — Z9842 Cataract extraction status, left eye: Secondary | ICD-10-CM | POA: Diagnosis not present

## 2015-12-05 DIAGNOSIS — R479 Unspecified speech disturbances: Secondary | ICD-10-CM | POA: Diagnosis not present

## 2015-12-05 DIAGNOSIS — Z888 Allergy status to other drugs, medicaments and biological substances status: Secondary | ICD-10-CM | POA: Diagnosis not present

## 2015-12-05 DIAGNOSIS — I25110 Atherosclerotic heart disease of native coronary artery with unstable angina pectoris: Secondary | ICD-10-CM | POA: Diagnosis present

## 2015-12-05 DIAGNOSIS — Z8249 Family history of ischemic heart disease and other diseases of the circulatory system: Secondary | ICD-10-CM | POA: Insufficient documentation

## 2015-12-05 DIAGNOSIS — I252 Old myocardial infarction: Secondary | ICD-10-CM | POA: Insufficient documentation

## 2015-12-05 DIAGNOSIS — M1990 Unspecified osteoarthritis, unspecified site: Secondary | ICD-10-CM | POA: Diagnosis not present

## 2015-12-05 DIAGNOSIS — R29818 Other symptoms and signs involving the nervous system: Secondary | ICD-10-CM | POA: Diagnosis not present

## 2015-12-05 DIAGNOSIS — Z79899 Other long term (current) drug therapy: Secondary | ICD-10-CM | POA: Insufficient documentation

## 2015-12-05 DIAGNOSIS — I2510 Atherosclerotic heart disease of native coronary artery without angina pectoris: Secondary | ICD-10-CM | POA: Diagnosis not present

## 2015-12-05 DIAGNOSIS — I10 Essential (primary) hypertension: Secondary | ICD-10-CM | POA: Diagnosis present

## 2015-12-05 DIAGNOSIS — Z801 Family history of malignant neoplasm of trachea, bronchus and lung: Secondary | ICD-10-CM | POA: Insufficient documentation

## 2015-12-05 DIAGNOSIS — Z885 Allergy status to narcotic agent status: Secondary | ICD-10-CM | POA: Insufficient documentation

## 2015-12-05 DIAGNOSIS — I639 Cerebral infarction, unspecified: Secondary | ICD-10-CM | POA: Diagnosis not present

## 2015-12-05 DIAGNOSIS — R471 Dysarthria and anarthria: Secondary | ICD-10-CM | POA: Diagnosis present

## 2015-12-05 DIAGNOSIS — L089 Local infection of the skin and subcutaneous tissue, unspecified: Secondary | ICD-10-CM | POA: Diagnosis not present

## 2015-12-05 DIAGNOSIS — R51 Headache: Secondary | ICD-10-CM | POA: Diagnosis not present

## 2015-12-05 DIAGNOSIS — I672 Cerebral atherosclerosis: Secondary | ICD-10-CM | POA: Diagnosis present

## 2015-12-05 DIAGNOSIS — C44311 Basal cell carcinoma of skin of nose: Secondary | ICD-10-CM | POA: Diagnosis not present

## 2015-12-05 DIAGNOSIS — J45909 Unspecified asthma, uncomplicated: Secondary | ICD-10-CM | POA: Insufficient documentation

## 2015-12-05 DIAGNOSIS — Z87891 Personal history of nicotine dependence: Secondary | ICD-10-CM | POA: Insufficient documentation

## 2015-12-05 DIAGNOSIS — E1149 Type 2 diabetes mellitus with other diabetic neurological complication: Secondary | ICD-10-CM

## 2015-12-05 DIAGNOSIS — I491 Atrial premature depolarization: Secondary | ICD-10-CM | POA: Diagnosis not present

## 2015-12-05 DIAGNOSIS — I69320 Aphasia following cerebral infarction: Secondary | ICD-10-CM | POA: Diagnosis not present

## 2015-12-05 DIAGNOSIS — E785 Hyperlipidemia, unspecified: Secondary | ICD-10-CM | POA: Diagnosis present

## 2015-12-05 DIAGNOSIS — Z7984 Long term (current) use of oral hypoglycemic drugs: Secondary | ICD-10-CM | POA: Diagnosis not present

## 2015-12-05 DIAGNOSIS — J439 Emphysema, unspecified: Secondary | ICD-10-CM | POA: Diagnosis not present

## 2015-12-05 DIAGNOSIS — E1169 Type 2 diabetes mellitus with other specified complication: Secondary | ICD-10-CM | POA: Diagnosis present

## 2015-12-05 DIAGNOSIS — Z7902 Long term (current) use of antithrombotics/antiplatelets: Secondary | ICD-10-CM | POA: Diagnosis not present

## 2015-12-05 DIAGNOSIS — R414 Neurologic neglect syndrome: Secondary | ICD-10-CM | POA: Insufficient documentation

## 2015-12-05 DIAGNOSIS — I152 Hypertension secondary to endocrine disorders: Secondary | ICD-10-CM | POA: Diagnosis present

## 2015-12-05 DIAGNOSIS — G459 Transient cerebral ischemic attack, unspecified: Principal | ICD-10-CM | POA: Insufficient documentation

## 2015-12-05 DIAGNOSIS — E1159 Type 2 diabetes mellitus with other circulatory complications: Secondary | ICD-10-CM | POA: Diagnosis present

## 2015-12-05 LAB — COMPREHENSIVE METABOLIC PANEL WITH GFR
ALT: 19 U/L (ref 17–63)
AST: 20 U/L (ref 15–41)
Albumin: 4 g/dL (ref 3.5–5.0)
Alkaline Phosphatase: 58 U/L (ref 38–126)
Anion gap: 12 (ref 5–15)
BUN: 15 mg/dL (ref 6–20)
CO2: 25 mmol/L (ref 22–32)
Calcium: 9.7 mg/dL (ref 8.9–10.3)
Chloride: 99 mmol/L — ABNORMAL LOW (ref 101–111)
Creatinine, Ser: 1.18 mg/dL (ref 0.61–1.24)
GFR calc Af Amer: >60 mL/min
GFR calc non Af Amer: 55 mL/min — ABNORMAL LOW
Glucose, Bld: 173 mg/dL — ABNORMAL HIGH (ref 65–99)
Potassium: 3.7 mmol/L (ref 3.5–5.1)
Sodium: 136 mmol/L (ref 135–145)
Total Bilirubin: 1 mg/dL (ref 0.3–1.2)
Total Protein: 6.7 g/dL (ref 6.5–8.1)

## 2015-12-05 LAB — CBC
HEMATOCRIT: 41.6 % (ref 39.0–52.0)
HEMOGLOBIN: 14.6 g/dL (ref 13.0–17.0)
MCH: 33.2 pg (ref 26.0–34.0)
MCHC: 35.1 g/dL (ref 30.0–36.0)
MCV: 94.5 fL (ref 78.0–100.0)
Platelets: 163 10*3/uL (ref 150–400)
RBC: 4.4 MIL/uL (ref 4.22–5.81)
RDW: 12.4 % (ref 11.5–15.5)
WBC: 7.8 10*3/uL (ref 4.0–10.5)

## 2015-12-05 LAB — DIFFERENTIAL
BASOS ABS: 0 10*3/uL (ref 0.0–0.1)
BASOS PCT: 0 %
EOS ABS: 0.2 10*3/uL (ref 0.0–0.7)
Eosinophils Relative: 3 %
Lymphocytes Relative: 25 %
Lymphs Abs: 1.9 10*3/uL (ref 0.7–4.0)
Monocytes Absolute: 0.7 10*3/uL (ref 0.1–1.0)
Monocytes Relative: 9 %
NEUTROS ABS: 5 10*3/uL (ref 1.7–7.7)
NEUTROS PCT: 63 %

## 2015-12-05 LAB — I-STAT CHEM 8, ED
BUN: 16 mg/dL (ref 6–20)
CALCIUM ION: 1.16 mmol/L (ref 1.13–1.30)
Chloride: 97 mmol/L — ABNORMAL LOW (ref 101–111)
Creatinine, Ser: 1.1 mg/dL (ref 0.61–1.24)
Glucose, Bld: 165 mg/dL — ABNORMAL HIGH (ref 65–99)
HCT: 45 % (ref 39.0–52.0)
Hemoglobin: 15.3 g/dL (ref 13.0–17.0)
Potassium: 3.7 mmol/L (ref 3.5–5.1)
SODIUM: 137 mmol/L (ref 135–145)
TCO2: 26 mmol/L (ref 0–100)

## 2015-12-05 LAB — I-STAT TROPONIN, ED: Troponin i, poc: 0.01 ng/mL (ref 0.00–0.08)

## 2015-12-05 LAB — PROTIME-INR
INR: 1.06 (ref 0.00–1.49)
Prothrombin Time: 14 s (ref 11.6–15.2)

## 2015-12-05 LAB — CBG MONITORING, ED: GLUCOSE-CAPILLARY: 180 mg/dL — AB (ref 65–99)

## 2015-12-05 LAB — APTT: aPTT: 29 s (ref 24–37)

## 2015-12-05 NOTE — ED Notes (Signed)
Pt from home with stroke like symptoms. At 1900 pt started having difficultly word finding.

## 2015-12-05 NOTE — Consult Note (Signed)
Admission H&P    Chief Complaint: Transient speech output difficulty.  HPI: [name] is an 82 y.o. male with a history of hypertension, hyperlipidemia, diabetes mellitus, stroke and myocardial infarction, brought to the emergency room and code stroke status following acute onset of speech output difficulty. Patient's wife indicated that he had severe difficulty with getting his words out. Speech was not noticeably slurred. There was no facial droop. He also had no focal extremity weakness. He's been taking Plavix daily since a stroke in December 2016. CT scan of his head showed no acute intracranial abnormality. Patient's speech difficulty subsequently resolved. His NIH stroke score at the time of this evaluation was 0.  LSN: 7:00 PM on 12/04/2012 tPA Given: No: Deficits resolved mRankin:  Past Medical History  Diagnosis Date  . DYSLIPIDEMIA   . HYPERTENSION   . CORONARY ATHEROSCLEROSIS NATIVE CORONARY ARTERY     Myocardial infarction at [facility] in 1996 with a directional atherectomy of the 90% LAD stenosis and medical management of 60-70% proximal circumflex and 50% RCA stenosis.  . Cerebral atherosclerosis   . UPPER RESPIRATORY INFECTION   . ILIAC ARTERY ANEURYSM   . Diabetes mellitus without complication ([place])   . Arthritis   . Asthma   . TIA (transient ischemic attack)   . Myocardial infarction ([place]) 1996  . Stroke ([place])   . History of rotator cuff surgery     Past Surgical History  Procedure Laterality Date  . Intraocular lens insertion    . Right knee surgery    . Repair of rectal fistula      Family History  Problem Relation Age of Onset  . Coronary artery disease Mother   . Cancer Father     lung  . Stroke Father   . Cancer Brother     lung  . Stroke Sister    Social History:  reports that he quit smoking about 31 years ago. He has never used smokeless tobacco. He reports that he does not drink alcohol or use illicit drugs.  Allergies:  Allergies   Allergen Reactions  . Codeine Other (See Comments)    Bad dreams  . Lopid [Gemfibrozil] Nausea Only  . Voltaren [Diclofenac Sodium] Diarrhea    Medications: Preadmission medications were reviewed by me.  ROS: History obtained from spouse and the patient  General ROS: negative for - chills, fatigue, fever, night sweats, weight gain or weight loss Psychological ROS: negative for - behavioral disorder, hallucinations, memory difficulties, mood swings or suicidal ideation Ophthalmic ROS: negative for - blurry vision, double vision, eye pain or loss of vision ENT ROS: negative for - epistaxis, nasal discharge, oral lesions, sore throat, tinnitus or vertigo Allergy and Immunology ROS: negative for - hives or itchy/watery eyes Hematological and Lymphatic ROS: negative for - bleeding problems, bruising or swollen lymph nodes Endocrine ROS: negative for - galactorrhea, hair pattern changes, polydipsia/polyuria or temperature intolerance Respiratory ROS: negative for - cough, hemoptysis, shortness of breath or wheezing Cardiovascular ROS: negative for - chest pain, dyspnea on exertion, edema or irregular heartbeat Gastrointestinal ROS: negative for - abdominal pain, diarrhea, hematemesis, nausea/vomiting or stool incontinence Genito-Urinary ROS: negative for - dysuria, hematuria, incontinence or urinary frequency/urgency Musculoskeletal ROS: negative for - joint swelling or muscular weakness Neurological ROS: as noted in HPI Dermatological ROS: Patient underwent excision of a suspicious appearing lesion in the right temporal area earlier today  Physical Examination: Blood pressure 185/89, pulse 68, temperature 98.1 F (36.7 C), temperature source Oral, resp.  rate 16, height 5\' 11"  (1.803 m), weight 89 kg (196 lb 3.4 oz), SpO2 96 %.  HEENT-  Normocephalic, no lesions, without obvious abnormality.  Normal external eye and conjunctiva.  Normal TM's bilaterally.  Normal auditory canals and  external ears. Normal external nose, mucus membranes and septum.  Normal pharynx. Neck supple with no masses, nodes, nodules or enlargement. Cardiovascular - regular rate and rhythm, S1, S2 normal, no murmur, click, rub or gallop Lungs - chest clear, no wheezing, rales, normal symmetric air entry Abdomen - soft, non-tender; bowel sounds normal; no masses,  no organomegaly Extremities - no joint deformities, effusion, or inflammation and no edema  Neurologic Examination: Mental Status: Alert, oriented, thought content appropriate.  Speech fluent without evidence of aphasia. Able to follow commands without difficulty. Cranial Nerves: II-Visual fields were normal. III/IV/VI-Pupils were equal and reacted normally to light. Extraocular movements were full and conjugate.    V/VII-no facial numbness and no facial weakness. VIII-normal. X-normal speech and symmetrical palatal movement. XI: trapezius strength/neck flexion strength normal bilaterally XII-midline tongue extension with normal strength. Motor: 5/5 bilaterally with normal tone and bulk Sensory: Normal throughout. Deep Tendon Reflexes: 1+ and symmetric. Plantars: Mute bilaterally Cerebellar: Normal finger-to-nose testing. Carotid auscultation: Normal  Results for orders placed or performed during the hospital encounter of 12/05/15 (from the past 48 hour(s))  CBC     Status: None   Collection Time: 12/05/15  9:31 PM  Result Value Ref Range   WBC 7.8 4.0 - 10.5 K/uL   RBC 4.40 4.22 - 5.81 MIL/uL   Hemoglobin 14.6 13.0 - 17.0 g/dL   HCT 41.6 39.0 - 52.0 %   MCV 94.5 78.0 - 100.0 fL   MCH 33.2 26.0 - 34.0 pg   MCHC 35.1 30.0 - 36.0 g/dL   RDW 12.4 11.5 - 15.5 %   Platelets 163 150 - 400 K/uL  Differential     Status: None   Collection Time: 12/05/15  9:31 PM  Result Value Ref Range   Neutrophils Relative % 63 %   Neutro Abs 5.0 1.7 - 7.7 K/uL   Lymphocytes Relative 25 %   Lymphs Abs 1.9 0.7 - 4.0 K/uL   Monocytes Relative 9  %   Monocytes Absolute 0.7 0.1 - 1.0 K/uL   Eosinophils Relative 3 %   Eosinophils Absolute 0.2 0.0 - 0.7 K/uL   Basophils Relative 0 %   Basophils Absolute 0.0 0.0 - 0.1 K/uL  I-stat troponin, ED (not at [facility], [facility])     Status: None   Collection Time: 12/05/15  9:40 PM  Result Value Ref Range   Troponin i, poc 0.01 0.00 - 0.08 ng/mL   Comment 3            Comment: Due to the release kinetics of cTnI, a negative result within the first hours of the onset of symptoms does not rule out myocardial infarction with certainty. If myocardial infarction is still suspected, repeat the test at appropriate intervals.   I-Stat Chem 8, ED  (not at [facility], [facility])     Status: Abnormal   Collection Time: 12/05/15  9:41 PM  Result Value Ref Range   Sodium 137 135 - 145 mmol/L   Potassium 3.7 3.5 - 5.1 mmol/L   Chloride 97 (L) 101 - 111 mmol/L   BUN 16 6 - 20 mg/dL   Creatinine, Ser 1.10 0.61 - 1.24 mg/dL   Glucose, Bld 165 (H) 65 - 99 mg/dL   Calcium, Ion 1.16  1.13 - 1.30 mmol/L   TCO2 26 0 - 100 mmol/L   Hemoglobin 15.3 13.0 - 17.0 g/dL   HCT 45.0 39.0 - 52.0 %   Ct Head Wo Contrast  12/05/2015  CLINICAL DATA:  Code stroke. EXAM: CT HEAD WITHOUT CONTRAST TECHNIQUE: Contiguous axial images were obtained from the base of the skull through the vertex without intravenous contrast. COMPARISON:  07/16/2015; brain MRI -07/16/2015 FINDINGS: Scattered periventricular hypodensities compatible with microvascular ischemic disease. The gray-white differentiation is otherwise well maintained without CT evidence of acute large territory infarct. No intraparenchymal or extra-axial mass or hemorrhage. Unchanged size and configuration of the ventricles and basilar cisterns. No midline shift. Intracranial atherosclerosis. Limited visualization of the paranasal sinuses and mastoid air cells is normal. No air-fluid levels. Regional soft tissues appear normal. No displaced calvarial fracture. Post bilateral cataract surgery.  IMPRESSION: Similar findings of mild ischemic disease without acute intracranial process. Critical Value/emergent results were called by telephone at the time of interpretation on 12/05/2015 at 9:43 pm to Dr. Nicole Kindred, who verbally acknowledged these results. Electronically Signed   By: Sandi Mariscal M.D.   On: 12/05/2015 21:47    Assessment: 82 y.o. male with multiple risk factors for stroke as well as history of stroke presenting with probable TIA. However, an acute recurrent cerebral infarction cannot be ruled out at this point.  Stroke Risk Factors - diabetes mellitus, hyperlipidemia and hypertension  Plan: 1. HgbA1c, fasting lipid panel 2. MRI, MRA  of the brain without contrast 3. PT consult, OT consult, Speech consult 4. Echocardiogram 5. Carotid dopplers 6. Prophylactic therapy-Antiplatelet med: Plavix 7. Risk factor modification 8. Telemetry monitoring  C.R. Nicole Kindred, MD [facility] [phone]  12/05/2015, 9:57 PM

## 2015-12-05 NOTE — ED Provider Notes (Signed)
CSN: [id number]     Arrival date & time 12/05/15  2129 History   First MD Initiated Contact with Patient 12/05/15 2134     Chief Complaint  Patient presents with  . Code Stroke    An emergency department physician performed an initial assessment on this suspected stroke patient at 2132. (Consider location/radiation/quality/duration/timing/severity/associated sxs/prior Treatment) HPI Patient presents as a code stroke. Patient began having speech difficulty about 2.5 hours prior to ED arrival. There was no clear precipitant. Symptoms were persistent, but I the time of arrival symptoms have improved. At the time of my evaluation the patient denies any weakness anywhere, states that his speech is essentially normal.  Family members corroborate this. Today the patient had elective removal of a right facial skin lesion, has some mild pain about the area, but has been otherwise well, with no recent illness. He also denies recent changes in medication. Patient take Plavix as directed. He has a notable history of stroke 6 months ago with aphasia, facial droop, hemi-neglect. Patient received TPA during that since the event. No similar events subsequently.  Past Medical History  Diagnosis Date  . DYSLIPIDEMIA   . HYPERTENSION   . CORONARY ATHEROSCLEROSIS NATIVE CORONARY ARTERY     Myocardial infarction at [facility] in 1996 with a directional atherectomy of the 90% LAD stenosis and medical management of 60-70% proximal circumflex and 50% RCA stenosis.  . Cerebral atherosclerosis   . UPPER RESPIRATORY INFECTION   . ILIAC ARTERY ANEURYSM   . Diabetes mellitus without complication ([place])   . Arthritis   . Asthma   . TIA (transient ischemic attack)   . Myocardial infarction ([place]) 1996  . Stroke ([place])   . History of rotator cuff surgery    Past Surgical History  Procedure Laterality Date  . Intraocular lens insertion    . Right knee surgery    . Repair of rectal fistula     Family  History  Problem Relation Age of Onset  . Coronary artery disease Mother   . Cancer Father     lung  . Stroke Father   . Cancer Brother     lung  . Stroke Sister    Social History  Substance Use Topics  . Smoking status: Former Smoker    Quit date: 11/24/1984  . Smokeless tobacco: Never Used  . Alcohol Use: No    Review of Systems  Constitutional:       Per HPI, otherwise negative  HENT:       Per HPI, otherwise negative  Respiratory:       Per HPI, otherwise negative  Cardiovascular:       Per HPI, otherwise negative  Gastrointestinal: Negative for vomiting.  Endocrine:       Negative aside from HPI  Genitourinary:       Neg aside from HPI   Musculoskeletal:       Per HPI, otherwise negative  Skin: Positive for wound.  Neurological: Positive for speech difficulty. Negative for syncope.      Allergies  Codeine; Lopid; and Voltaren  Home Medications   Prior to Admission medications   Medication Sig Start Date End Date Taking? Authorizing Provider  albuterol (PROAIR HFA) 108 (90 BASE) MCG/ACT inhaler INHALE TWO PUFFS INTO LUNGS 4 TIMES DAILY AS NEEDED 02/08/15  Yes Chipper Herb, MD  benazepril (LOTENSIN) 10 MG tablet Take 10 mg by mouth daily. 11/18/15  Yes Historical Provider, MD  Cholecalciferol (VITAMIN D3) 50000 UNITS CAPS Take  1 capsule by mouth once a week. Patient taking differently: Take 1 capsule by mouth once a week. On Friday 07/28/15  Yes Timmothy Euler, MD  clopidogrel (PLAVIX) 75 MG tablet Take 1 tablet (75 mg total) by mouth daily. 10/14/15  Yes Timmothy Euler, MD  fish oil-omega-3 fatty acids 1000 MG capsule Take 2 g by mouth daily.     Yes Historical Provider, MD  Fluticasone-Salmeterol (ADVAIR DISKUS) 100-50 MCG/DOSE AEPB INHALE ONE DOSE BY MOUTH EVERY 12 HOURS AS NEEDED 02/08/15  Yes Chipper Herb, MD  hydrochlorothiazide (HYDRODIURIL) 25 MG tablet TAKE ONE-HALF TABLET BY MOUTH ONCE DAILY EXCEPT  ON  MONDAY,  [facility]  AND  FRIDAY  TAKE  A   WHOLE  TABLET 02/08/15  Yes Chipper Herb, MD  metFORMIN (GLUCOPHAGE-XR) 500 MG 24 hr tablet Take 1 tablet (500 mg total) by mouth 2 (two) times daily. 02/08/15  Yes Chipper Herb, MD  metoprolol (LOPRESSOR) 100 MG tablet Take 1 tablet (100 mg total) by mouth 2 (two) times daily. 02/08/15  Yes Chipper Herb, MD  nitroGLYCERIN (NITROSTAT) 0.4 MG SL tablet Place 0.4 mg under the tongue every 5 (five) minutes as needed for chest pain.   Yes Historical Provider, MD  rosuvastatin (CRESTOR) 10 MG tablet Take 1 tablet (10 mg total) by mouth daily. 02/08/15  Yes Chipper Herb, MD  benazepril (LOTENSIN) 20 MG tablet Take 1 tablet (20 mg total) by mouth daily. Patient not taking: Reported on 12/05/2015 05/23/15   Timmothy Euler, MD  NITROSTAT 0.4 MG SL tablet USE AS DIRECTED Patient not taking: Reported on 12/05/2015 09/25/15   Timmothy Euler, MD   BP 171/87 mmHg  Pulse 72  Temp(Src) 98.1 F (36.7 C) (Oral)  Resp 19  Ht 5\' 11"  (1.803 m)  Wt 196 lb 3.4 oz (89 kg)  BMI 27.38 kg/m2  SpO2 95% Physical Exam  Constitutional: He is oriented to person, place, and time. He appears well-developed. No distress.  HENT:  Head: Normocephalic and atraumatic.  Eyes: Conjunctivae and EOM are normal.  Cardiovascular: Normal rate and regular rhythm.   Pulmonary/Chest: Effort normal. No stridor. No respiratory distress.  Abdominal: He exhibits no distension.  Musculoskeletal: He exhibits no edema.  Neurological: He is alert and oriented to person, place, and time. He displays no atrophy and no tremor. No cranial nerve deficit or sensory deficit. He exhibits normal muscle tone. He displays no seizure activity. Coordination normal.  Skin: Skin is warm and dry.     Psychiatric: He has a normal mood and affect.  Nursing note and vitals reviewed.   ED Course  Procedures (including critical care time) Labs Review Labs Reviewed  COMPREHENSIVE METABOLIC PANEL - Abnormal; Notable for the following:    Chloride 99 (*)     Glucose, Bld 173 (*)    GFR calc non Af Amer 55 (*)    All other components within normal limits  CBG MONITORING, ED - Abnormal; Notable for the following:    Glucose-Capillary 180 (*)    All other components within normal limits  I-STAT CHEM 8, ED - Abnormal; Notable for the following:    Chloride 97 (*)    Glucose, Bld 165 (*)    All other components within normal limits  PROTIME-INR  APTT  CBC  DIFFERENTIAL  I-STAT TROPOININ, ED    Imaging Review Ct Head Wo Contrast  12/05/2015  CLINICAL DATA:  Code stroke. EXAM: CT HEAD WITHOUT CONTRAST TECHNIQUE: Contiguous  axial images were obtained from the base of the skull through the vertex without intravenous contrast. COMPARISON:  07/16/2015; brain MRI -07/16/2015 FINDINGS: Scattered periventricular hypodensities compatible with microvascular ischemic disease. The gray-white differentiation is otherwise well maintained without CT evidence of acute large territory infarct. No intraparenchymal or extra-axial mass or hemorrhage. Unchanged size and configuration of the ventricles and basilar cisterns. No midline shift. Intracranial atherosclerosis. Limited visualization of the paranasal sinuses and mastoid air cells is normal. No air-fluid levels. Regional soft tissues appear normal. No displaced calvarial fracture. Post bilateral cataract surgery. IMPRESSION: Similar findings of mild ischemic disease without acute intracranial process. Critical Value/emergent results were called by telephone at the time of interpretation on 12/05/2015 at 9:43 pm to Dr. Nicole Kindred, who verbally acknowledged these results. Electronically Signed   By: Sandi Mariscal M.D.   On: 12/05/2015 21:47   I have personally reviewed and evaluated these images and lab results as part of my medical decision-making.   EKG Interpretation None     Patient's initial evaluation conducted with our neurology colleagues. With resolution of symptoms, reassuring CT scan, the patient will be  admitted for further evaluation, management. MDM  Patient presents with concern of aphasia. Notably, symptoms lasted for almost 2.5 hours, but resolved. Patient had no other weakness, and after resolution of his speech difficulty, had no ongoing neurologic changes. Given the patient's history of recent stroke, and risk factors, the patient required admission for further evaluation and management, though the initial studies were reassuring.   Carmin Muskrat, MD 12/05/15 2230

## 2015-12-05 NOTE — ED Notes (Signed)
Attempted report 

## 2015-12-06 ENCOUNTER — Encounter (HOSPITAL_COMMUNITY): Payer: Medicare Other

## 2015-12-06 ENCOUNTER — Encounter (HOSPITAL_COMMUNITY): Payer: Self-pay | Admitting: Rehabilitation

## 2015-12-06 ENCOUNTER — Observation Stay (HOSPITAL_COMMUNITY): Payer: Medicare Other

## 2015-12-06 DIAGNOSIS — E1149 Type 2 diabetes mellitus with other diabetic neurological complication: Secondary | ICD-10-CM | POA: Diagnosis not present

## 2015-12-06 DIAGNOSIS — G459 Transient cerebral ischemic attack, unspecified: Secondary | ICD-10-CM | POA: Diagnosis not present

## 2015-12-06 DIAGNOSIS — E785 Hyperlipidemia, unspecified: Secondary | ICD-10-CM | POA: Diagnosis not present

## 2015-12-06 DIAGNOSIS — R471 Dysarthria and anarthria: Secondary | ICD-10-CM

## 2015-12-06 DIAGNOSIS — I10 Essential (primary) hypertension: Secondary | ICD-10-CM | POA: Diagnosis not present

## 2015-12-06 DIAGNOSIS — I639 Cerebral infarction, unspecified: Secondary | ICD-10-CM | POA: Diagnosis not present

## 2015-12-06 DIAGNOSIS — E1169 Type 2 diabetes mellitus with other specified complication: Secondary | ICD-10-CM

## 2015-12-06 DIAGNOSIS — R4701 Aphasia: Secondary | ICD-10-CM | POA: Diagnosis not present

## 2015-12-06 DIAGNOSIS — E119 Type 2 diabetes mellitus without complications: Secondary | ICD-10-CM

## 2015-12-06 LAB — LIPID PANEL
CHOL/HDL RATIO: 3.2 ratio
CHOLESTEROL: 115 mg/dL (ref 0–200)
HDL: 36 mg/dL — AB (ref >40)
LDL Cholesterol: 32 mg/dL (ref 0–99)
TRIGLYCERIDES: 233 mg/dL — AB (ref <150)
VLDL: 47 mg/dL — ABNORMAL HIGH (ref 0–40)

## 2015-12-06 LAB — GLUCOSE, CAPILLARY: GLUCOSE-CAPILLARY: 149 mg/dL — AB (ref 65–99)

## 2015-12-06 MED ORDER — ALBUTEROL SULFATE (2.5 MG/3ML) 0.083% IN NEBU: 3.0000 mL | INHALATION_SOLUTION | Freq: Four times a day (QID) | RESPIRATORY_TRACT | Status: DC | PRN

## 2015-12-06 MED ORDER — ROSUVASTATIN CALCIUM 10 MG PO TABS: 10.0000 mg | ORAL_TABLET | Freq: Every day | ORAL | Status: DC

## 2015-12-06 MED ORDER — STROKE: EARLY STAGES OF RECOVERY BOOK
Freq: Once | Status: AC
  Administered 2015-12-06: 02:00:00
  Filled 2015-12-06: qty 1

## 2015-12-06 MED ORDER — ENOXAPARIN SODIUM 40 MG/0.4ML ~~LOC~~ SOLN
40.0000 mg | SUBCUTANEOUS | Status: DC
  Administered 2015-12-06: 40 mg via SUBCUTANEOUS
  Filled 2015-12-06: qty 0.4

## 2015-12-06 MED ORDER — SENNOSIDES-DOCUSATE SODIUM 8.6-50 MG PO TABS: 1.0000 | ORAL_TABLET | Freq: Every evening | ORAL | Status: DC | PRN

## 2015-12-06 MED ORDER — SODIUM CHLORIDE 0.9% FLUSH
3.0000 mL | Freq: Two times a day (BID) | INTRAVENOUS | Status: DC
  Administered 2015-12-06 (×2): 3 mL via INTRAVENOUS

## 2015-12-06 MED ORDER — METFORMIN HCL ER 500 MG PO TB24
500.0000 mg | ORAL_TABLET | Freq: Two times a day (BID) | ORAL | Status: DC
  Administered 2015-12-06: 500 mg via ORAL
  Filled 2015-12-06 (×3): qty 1

## 2015-12-06 MED ORDER — VITAMIN D3 25 MCG (1000 UNIT) PO TABS
5000.0000 [IU] | ORAL_TABLET | ORAL | Status: DC
  Administered 2015-12-06: 5000 [IU] via ORAL
  Filled 2015-12-06 (×2): qty 5

## 2015-12-06 MED ORDER — MOMETASONE FURO-FORMOTEROL FUM 100-5 MCG/ACT IN AERO
2.0000 | INHALATION_SPRAY | Freq: Two times a day (BID) | RESPIRATORY_TRACT | Status: DC
  Administered 2015-12-06: 2 via RESPIRATORY_TRACT
  Filled 2015-12-06: qty 8.8

## 2015-12-06 MED ORDER — CLOPIDOGREL BISULFATE 75 MG PO TABS
75.0000 mg | ORAL_TABLET | Freq: Every day | ORAL | Status: DC
  Administered 2015-12-06: 75 mg via ORAL
  Filled 2015-12-06: qty 1

## 2015-12-06 MED ORDER — NITROGLYCERIN 0.4 MG SL SUBL: 0.4000 mg | SUBLINGUAL_TABLET | SUBLINGUAL | Status: DC | PRN

## 2015-12-06 NOTE — Progress Notes (Signed)
[name] to be D/C'd Home per MD order.  Discussed with the patient and all questions fully answered.  VSS, Skin clean, dry and intact without evidence of skin break down, no evidence of skin tears noted. IV catheter discontinued intact. Site without signs and symptoms of complications. Dressing and pressure applied.  An After Visit Summary was printed and given to the patient. Patient received prescription.  D/c education completed with patient/family including follow up instructions, medication list, d/c activities limitations if indicated, with other d/c instructions as indicated by MD - patient able to verbalize understanding, all questions fully answered.   Patient instructed to return to ED, call 911, or call MD for any changes in condition.   Patient escorted via [place], and D/C home via private auto.  [name] 12/06/2015 3:22 PM

## 2015-12-06 NOTE — Progress Notes (Signed)
PT Cancellation Note  Patient Details Name: [name] MRN: [id number] DOB: 04/11/1930   Cancelled Treatment:    Reason Eval/Treat Not Completed: PT screened, no needs identified, will sign off Per OT, pt is functioning at baseline ambulating Mod I and balance looks good. All symptoms have resolved. Does not require skilled PT services. Screened and signed off. Please consult if any functional changes noted.  [name] 12/06/2015, 1:31 PM Wray Kearns, [place], DPT [phone]

## 2015-12-06 NOTE — Care Management Note (Signed)
Case Management Note  Patient Details  Name: [name] MRN: [id number] Date of Birth: 13-Jul-1930  Subjective/Objective:                 Spoke with spouse and daughter in room while patient was getting MRI. Patient is independent and does not have any DME at home, they do not feel he has any physical deficits and he will not need DME or PT at home. PT eval pending. No barriers to MD appointments, or obtaining meds.   Action/Plan:  No DC needs anticipated.  Expected Discharge Date:                  Expected Discharge Plan:  Home/Self Care  In-House Referral:     Discharge planning Services  CM Consult  Post Acute Care Choice:  NA Choice offered to:  Spouse  DME Arranged:    DME Agency:     HH Arranged:    [place] Agency:     Status of Service:  Completed, signed off  Medicare Important Message Given:    Date Medicare IM Given:    Medicare IM give by:    Date Additional Medicare IM Given:    Additional Medicare Important Message give by:     If discussed at [place] of Stay Meetings, dates discussed:    Additional Comments:  Carles Collet, RN 12/06/2015, 12:06 PM

## 2015-12-06 NOTE — Discharge Summary (Signed)
Physician Discharge Summary  [name]  [id number]  DOB: [birth year]  DOA: 12/05/2015  PCP: Kenn File, MD  Admit date: 12/05/2015 Discharge date: 12/06/2015  Time spent: Less than 30 minutes  Recommendations for Outpatient Follow-up:  1. Dr. Kenn File, PCP in 1 week. 2. Dr. Antony Contras, Neurology in 2 months.  Discharge Diagnoses:  Principal Problem:   TIA (transient ischemic attack) Active Problems:   Hyperlipidemia LDL goal <70   Essential hypertension   CORONARY ATHEROSCLEROSIS NATIVE CORONARY ARTERY   Cerebral atherosclerosis   DM type 2 (diabetes mellitus, type 2) ([place])   Discharge Condition: Improved & Stable  Diet recommendation: Heart healthy and diabetic diet.  Filed Weights   12/05/15 2154 12/06/15 0514  Weight: 89 kg (196 lb 3.4 oz) 89.9 kg (198 lb 3.1 oz)    History of present illness & Hospital course:  82 year old male with a PMH of HTN, HLD, DM 2, stroke, CAD/MI, asthma, excision of skin cancer lesion over right temporal area on day prior to admission, presented to [facility] with complaints of sudden onset of speech difficulty that started around 7 PM on 12/05/15. As per patient, spouse and daughter-in-law, patient understood what was being said but verbal output was inappropriate and he was aware of this. No associated slurred speech, facial asymmetry, extremity tingling, numbness or weakness. The symptoms lasted approximately 2.5 hours and then resolved spontaneously. In the ED, stroke code was initiated, however by that time the symptoms had resolved and no TPA was given. He was admitted to telemetry which showed sinus rhythm without arrhythmias. No recurrence of symptoms. MRI brain confirmed suspected punctate acute left frontal subcortical infarct. 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 He had expressive aphasia related to acute left brain stroke which was  transient and has resolved. Neurology/stroke service consultation and follow-up appreciated. Discussed with Dr. Leonie Man, neurology who indicated that since patient had completed stroke workup with 2-D echo and carotid Dopplers in December 2016, no need to repeat or do any further workup. LDL 32. (Was 108 in December 2016). Hemoglobin A1c 07/16/15:6.4. 2-D echo 07/17/15: LVEF 60-65 percent & grade 1 diastolic dysfunction. Carotid Dopplers 07/16/15: Bilateral 1-39 percent ICA stenosis. Vertebral artery flow is antegrade. PT, OT and ST have evaluated and do not see any home needs. Continue Plavix and statins. Patient was advised to hold blood pressure medications for the next 2 days unless home blood pressure checks are >180/110 mmHg and they verbalized understanding. Patient is to follow-up with Dr. Leonie Man in 2 months.   Consultations:  Neurology/stroke service  Procedures:  None    Discharge Exam:  Complaints:  Denies complaints. No recurrence of speech difficulties. Back to baseline.  Filed Vitals:   12/06/15 0050 12/06/15 0514 12/06/15 0903 12/06/15 1505  BP: 152/73 156/86  135/76  Pulse: 66 76 73 91  Temp: 98.1 F (36.7 C) 98.2 F (36.8 C)  97.9 F (36.6 C)  TempSrc: Oral   Oral  Resp: 18 18 18 19   Height:  5\' 11"  (1.803 m)    Weight:  89.9 kg (198 lb 3.1 oz)    SpO2: 97% 96% 92% 94%    General exam: Pleasant elderly male sitting up comfortably in bed this morning. Respiratory system: Clear. No increased work of breathing. Cardiovascular system: S1 & S2 heard, RRR. No JVD, murmurs, gallops, clicks or pedal edema. Telemetry: Sinus rhythm. Gastrointestinal system: Abdomen is nondistended, soft and nontender. Normal bowel  sounds heard. Central nervous system: Alert and oriented. No focal neurological deficits. Extremities: Symmetric 5 x 5 power.  Discharge Instructions      Discharge Instructions    Call MD for:    Complete by:  As directed   Strokelike symptoms.     Diet -  low sodium heart healthy    Complete by:  As directed      Diet Carb Modified    Complete by:  As directed      Increase activity slowly    Complete by:  As directed             Medication List    TAKE these medications        albuterol 108 (90 Base) MCG/ACT inhaler  Commonly known as:  PROAIR HFA  INHALE TWO PUFFS INTO LUNGS 4 TIMES DAILY AS NEEDED     benazepril 10 MG tablet  Commonly known as:  LOTENSIN  Take 10 mg by mouth daily.     clopidogrel 75 MG tablet  Commonly known as:  PLAVIX  Take 1 tablet (75 mg total) by mouth daily.     fish oil-omega-3 fatty acids 1000 MG capsule  Take 2 g by mouth daily.     Fluticasone-Salmeterol 100-50 MCG/DOSE Aepb  Commonly known as:  ADVAIR DISKUS  INHALE ONE DOSE BY MOUTH EVERY 12 HOURS AS NEEDED     hydrochlorothiazide 25 MG tablet  Commonly known as:  HYDRODIURIL  TAKE ONE-HALF TABLET BY MOUTH ONCE DAILY EXCEPT  ON  MONDAY,  WEDNESDAY  AND  FRIDAY  TAKE  A  WHOLE  TABLET     metFORMIN 500 MG 24 hr tablet  Commonly known as:  GLUCOPHAGE-XR  Take 1 tablet (500 mg total) by mouth 2 (two) times daily.     metoprolol 100 MG tablet  Commonly known as:  LOPRESSOR  Take 1 tablet (100 mg total) by mouth 2 (two) times daily.     nitroGLYCERIN 0.4 MG SL tablet  Commonly known as:  NITROSTAT  Place 0.4 mg under the tongue every 5 (five) minutes as needed for chest pain.     rosuvastatin 10 MG tablet  Commonly known as:  CRESTOR  Take 1 tablet (10 mg total) by mouth daily.     Vitamin D3 50000 units Caps  Take 1 capsule by mouth once a week.       Follow-up Information    Follow up with SETHI,PRAMOD, MD. Schedule an appointment as soon as possible for a visit in 2 months.   Specialties:  Neurology, Radiology   Contact information:   [address] [place] [address] [phone]       Follow up with Kenn File, MD. Schedule an appointment as soon as possible for a visit in 1 week.   Specialty:   Family Medicine   Contact information:   [place] [address] [phone]       Get Medicines reviewed and adjusted: Please take all your medications with you for your next visit with your Primary MD  Please request your Primary MD to go over all hospital tests and procedure/radiological results at the follow up. Please ask your Primary MD to get all Hospital records sent to his/her office.  If you experience worsening of your admission symptoms, develop shortness of breath, life threatening emergency, suicidal or homicidal thoughts you must seek medical attention immediately by calling 911 or calling your MD immediately if symptoms less severe.  You must read complete instructions/literature along with all the possible adverse reactions/side effects for all the Medicines you take and that have been prescribed to you. Take any new Medicines after you have completely understood and accept all the possible adverse reactions/side effects.   Do not drive when taking pain medications.   Do not take more than prescribed Pain, Sleep and Anxiety Medications  Special Instructions: If you have smoked or chewed Tobacco in the last 2 yrs please stop smoking, stop any regular Alcohol and or any Recreational drug use.  Wear Seat belts while driving.  Please note  You were cared for by a hospitalist during your hospital stay. Once you are discharged, your primary care physician will handle any further medical issues. Please note that NO REFILLS for any discharge medications will be authorized once you are discharged, as it is imperative that you return to your primary care physician (or establish a relationship with a primary care physician if you do not have one) for your aftercare needs so that they can reassess your need for medications and monitor your lab values.    The results of significant diagnostics from this hospitalization (including imaging, microbiology, ancillary and  laboratory) are listed below for reference.    Significant Diagnostic Studies: Dg Chest 2 View  12/05/2015  CLINICAL DATA:  TIA tonight.  Hypertension.  Previous smoker. EXAM: CHEST  2 VIEW COMPARISON:  06/02/2012 FINDINGS: Mild hyperinflation suggesting emphysema. Central interstitial pattern to the lungs indicating chronic bronchitis. Linear fibrosis in the lung bases. No focal airspace disease or consolidation. No blunting of costophrenic angles. No pneumothorax. Mediastinal contours appear intact. Calcified and tortuous aorta. Normal heart size and pulmonary vascularity. Degenerative changes in the spine. IMPRESSION: Mild emphysematous and chronic bronchitic changes in the lungs. No evidence of active pulmonary disease. Electronically Signed   By: Lucienne Capers M.D.   On: 12/05/2015 23:57   Ct Head Wo Contrast  12/05/2015  CLINICAL DATA:  Code stroke. EXAM: CT HEAD WITHOUT CONTRAST TECHNIQUE: Contiguous axial images were obtained from the base of the skull through the vertex without intravenous contrast. COMPARISON:  07/16/2015; brain MRI -07/16/2015 FINDINGS: Scattered periventricular hypodensities compatible with microvascular ischemic disease. The gray-white differentiation is otherwise well maintained without CT evidence of acute large territory infarct. No intraparenchymal or extra-axial mass or hemorrhage. Unchanged size and configuration of the ventricles and basilar cisterns. No midline shift. Intracranial atherosclerosis. Limited visualization of the paranasal sinuses and mastoid air cells is normal. No air-fluid levels. Regional soft tissues appear normal. No displaced calvarial fracture. Post bilateral cataract surgery. IMPRESSION: Similar findings of mild ischemic disease without acute intracranial process. Critical Value/emergent results were called by telephone at the time of interpretation on 12/05/2015 at 9:43 pm to Dr. Nicole Kindred, who verbally acknowledged these results. Electronically Signed    By: Sandi Mariscal M.D.   On: 12/05/2015 21:47   Mr Jodene Nam Head Wo Contrast  12/06/2015  CLINICAL DATA:  Acute onset speech difficulty. History of hypertension, dyslipidemia. EXAM: MRA HEAD WITHOUT CONTRAST TECHNIQUE: Angiographic images of the Circle of Willis were obtained using MRA technique without intravenous contrast. COMPARISON:  CT HEAD Dec 05, 2015 at 2141 hours FINDINGS: Mild motion degraded examination. Anterior circulation: Normal flow related enhancement of the included cervical, petrous, cavernous and supraclinoid internal carotid arteries. 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 Patent anterior communicating artery. Normal flow related enhancement of the anterior and middle cerebral arteries, including distal segments. Supernumerary anterior cerebral artery arising from  LEFT A1-2 junction. No large vessel occlusion, high-grade stenosis, abnormal luminal irregularity. Posterior circulation: Codominant vertebral arteries. Basilar artery is patent, with normal flow related enhancement of the main branch vessels. Normal flow related enhancement of the posterior cerebral arteries. Fetal origin RIGHT posterior cerebral artery. LEFT posterior communicating artery is patent. Loss of flow related enhancement LEFT posterior cerebral artery P2 segment on maximum intensity projected reformations though the vessel is patent on raw data. No large vessel occlusion, high-grade stenosis, abnormal luminal irregularity, aneurysm. IMPRESSION: No emergent large vessel occlusion or high-grade stenosis on this mildly motion degraded examination. 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 Electronically Signed   By: Elon Alas M.D.   On: 12/06/2015 00:19   Mr Brain Wo Contrast  12/06/2015  CLINICAL DATA:  Sudden onset expressive aphasia yesterday. Symptoms since resolved. EXAM: MRI HEAD WITHOUT CONTRAST TECHNIQUE: Multiplanar, multiecho pulse sequences of the brain and surrounding  structures were obtained without intravenous contrast. COMPARISON:  Head CT 12/05/2015 and MRI 07/16/2015 FINDINGS: There is a 2 mm focus of hyperintense trace diffusion signal in the posterior left frontal lobe subcortical white matter on the coronal series (series 6, image 16). Very faint signal abnormality is present in this region on the axial diffusion series. It is difficult to confirm reduced ADC due to its small size. There is no other evidence of acute infarct. There is moderate cerebral atrophy. Patchy T2 hyperintensities throughout the cerebral white matter and in the pons are unchanged and compatible with moderate chronic small vessel ischemic disease. Small, chronic infarcts are again seen in both cerebellar hemispheres and in the left periatrial white matter. Prior bilateral cataract extraction is noted. Paranasal sinuses and mastoid air cells are clear. Major intracranial arterial vascular flow voids are preserved. Abnormal appearance of the left sigmoid sinus and left jugular bulb is unchanged and may reflect slow flow. IMPRESSION: 1. Suspected punctate acute left frontal subcortical infarct. 2. Moderate chronic small vessel ischemic disease and cerebral atrophy. Electronically Signed   By: Logan Bores M.D.   On: 12/06/2015 12:22    Microbiology: No results found for this or any previous visit (from the past 240 hour(s)).   Labs: Basic Metabolic Panel:  Recent Labs Lab 12/05/15 2131 12/05/15 2141  NA 136 137  K 3.7 3.7  CL 99* 97*  CO2 25  --   GLUCOSE 173* 165*  BUN 15 16  CREATININE 1.18 1.10  CALCIUM 9.7  --    Liver Function Tests:  Recent Labs Lab 12/05/15 2131  AST 20  ALT 19  ALKPHOS 58  BILITOT 1.0  PROT 6.7  ALBUMIN 4.0   No results for input(s): LIPASE, AMYLASE in the last 168 hours. No results for input(s): AMMONIA in the last 168 hours. CBC:  Recent Labs Lab 12/05/15 2131 12/05/15 2141  WBC 7.8  --   NEUTROABS 5.0  --   HGB 14.6 15.3  HCT 41.6  45.0  MCV 94.5  --   PLT 163  --    Cardiac Enzymes: No results for input(s): CKTOTAL, CKMB, CKMBINDEX, TROPONINI in the last 168 hours. BNP: BNP (last 3 results) No results for input(s): BNP in the last 8760 hours.  ProBNP (last 3 results) No results for input(s): PROBNP in the last 8760 hours.  CBG:  Recent Labs Lab 12/05/15 2218 12/06/15 0827  GLUCAP 180* 149*    Discussed extensively with patient, spouse, patient's 2 daughters-in-law's. Updated care and answered questions.   Signed:  Vernell Leep, MD, FACP, FHM.  [facility] Pager [phone]  If 7PM-7AM, please contact night-coverage [email or web address] Password [email or web address] 12/06/2015, 3:27 PM

## 2015-12-06 NOTE — Progress Notes (Signed)
Code Stroke called on 82 y.o male. LSN 1900. Wife called EMS for acute difficulty getting words out. Speech was not slurred, no facial droop or any other obvious deficits. Pt arrived to [facility], taken STAT to CT scan after labs drawn. CT reviewed per Neurologist Dr. Nicole Kindred showed no acute intracranial abnormality. Pertinent medical history includes hyperlipidemia, HTN, CAD, stroke 07/2015, TIA. Pt takes Plavix daily. NIHSS completed scored 0. No TPA treatment due to resolved symptoms. Pt to be admitted for full stroke workup.

## 2015-12-06 NOTE — Discharge Instructions (Signed)
Stroke Prevention Some medical conditions and behaviors are associated with an increased chance of having a stroke. You may prevent a stroke by making healthy choices and managing medical conditions. HOW CAN I REDUCE MY RISK OF HAVING A STROKE?   Stay physically active. Get at least 30 minutes of activity on most or all days.  Do not smoke. It may also be helpful to avoid exposure to secondhand smoke.  Limit alcohol use. Moderate alcohol use is considered to be:  No more than 2 drinks per day for men.  No more than 1 drink per day for nonpregnant women.  Eat healthy foods. This involves:  Eating 5 or more servings of fruits and vegetables a day.  Making dietary changes that address high blood pressure (hypertension), high cholesterol, diabetes, or obesity.  Manage your cholesterol levels.  Making food choices that are high in fiber and low in saturated fat, trans fat, and cholesterol may control cholesterol levels.  Take any prescribed medicines to control cholesterol as directed by your health care provider.  Manage your diabetes.  Controlling your carbohydrate and sugar intake is recommended to manage diabetes.  Take any prescribed medicines to control diabetes as directed by your health care provider.  Control your hypertension.  Making food choices that are low in salt (sodium), saturated fat, trans fat, and cholesterol is recommended to manage hypertension.  Ask your health care provider if you need treatment to lower your blood pressure. Take any prescribed medicines to control hypertension as directed by your health care provider.  If you are 18-39 years of age, have your blood pressure checked every 3-5 years. If you are 40 years of age or older, have your blood pressure checked every year.  Maintain a healthy weight.  Reducing calorie intake and making food choices that are low in sodium, saturated fat, trans fat, and cholesterol are recommended to manage  weight.  Stop drug abuse.  Avoid taking birth control pills.  Talk to your health care provider about the risks of taking birth control pills if you are over 35 years old, smoke, get migraines, or have ever had a blood clot.  Get evaluated for sleep disorders (sleep apnea).  Talk to your health care provider about getting a sleep evaluation if you snore a lot or have excessive sleepiness.  Take medicines only as directed by your health care provider.  For some people, aspirin or blood thinners (anticoagulants) are helpful in reducing the risk of forming abnormal blood clots that can lead to stroke. If you have the irregular heart rhythm of atrial fibrillation, you should be on a blood thinner unless there is a good reason you cannot take them.  Understand all your medicine instructions.  Make sure that other conditions (such as anemia or atherosclerosis) are addressed. SEEK IMMEDIATE MEDICAL CARE IF:   You have sudden weakness or numbness of the face, arm, or leg, especially on one side of the body.  Your face or eyelid droops to one side.  You have sudden confusion.  You have trouble speaking (aphasia) or understanding.  You have sudden trouble seeing in one or both eyes.  You have sudden trouble walking.  You have dizziness.  You have a loss of balance or coordination.  You have a sudden, severe headache with no known cause.  You have new chest pain or an irregular heartbeat. Any of these symptoms may represent a serious problem that is an emergency. Do not wait to see if the symptoms will   go away. Get medical help at once. Call your local emergency services (911 in [place] Do not drive yourself to the hospital.   This information is not intended to replace advice given to you by your health care provider. Make sure you discuss any questions you have with your health care provider.   Document Released: 08/27/2004 Document Revised: 08/10/2014 Document Reviewed:  01/20/2013 Elsevier Interactive Patient Education 2016 [employer]  

## 2015-12-06 NOTE — Care Management Obs Status (Signed)
[place] NOTIFICATION   Patient Details  Name: [name] MRN: [id number] Date of Birth: [birth year]   Medicare Observation Status Notification Given:  Yes    Carles Collet, RN 12/06/2015, 11:41 AM

## 2015-12-06 NOTE — Evaluation (Signed)
Speech Language Pathology Evaluation Patient Details Name: [name] MRN: [id number] DOB: 1929/12/06 Today's Date: 12/06/2015 Time: 0950-1010 SLP Time Calculation (min) (ACUTE ONLY): 20 min  Problem List:  Patient Active Problem List   Diagnosis Date Noted  . DM type 2 (diabetes mellitus, type 2) ([place]) 12/06/2015  . TIA (transient ischemic attack) 12/05/2015  . CKD (chronic kidney disease) stage 3, GFR 30-59 ml/min 07/17/2015  . Acute right-sided weakness   . Stroke with cerebral ischemia ([place]) 07/15/2015  . CVA (cerebral infarction) - L internal capsule infarct s/p IV tPA secondary to small vessel disease  07/15/2015  . History of TIA (transient ischemic attack) 09/12/2013  . Metabolic syndrome 0000000  . Vitamin D deficiency 08/01/2013  . Diabetes ([place]) 08/01/2013  . PVD (peripheral vascular disease) ([place]) 11/25/2011  . ILIAC ARTERY ANEURYSM 07/17/2010  . Hyperlipidemia LDL goal <70 08/28/2009  . CORONARY ATHEROSCLEROSIS NATIVE CORONARY ARTERY 08/28/2009  . UPPER RESPIRATORY INFECTION 08/28/2009  . Essential hypertension 06/05/2009  . Cerebral atherosclerosis 06/04/2009   Past Medical History:  Past Medical History  Diagnosis Date  . DYSLIPIDEMIA   . HYPERTENSION   . CORONARY ATHEROSCLEROSIS NATIVE CORONARY ARTERY     Myocardial infarction at [facility] in 1996 with a directional atherectomy of the 90% LAD stenosis and medical management of 60-70% proximal circumflex and 50% RCA stenosis.  . Cerebral atherosclerosis   . UPPER RESPIRATORY INFECTION   . ILIAC ARTERY ANEURYSM   . Diabetes mellitus without complication ([place])   . Arthritis   . Asthma   . TIA (transient ischemic attack)   . Myocardial infarction ([place]) 1996  . Stroke ([place])   . History of rotator cuff surgery    Past Surgical History:  Past Surgical History  Procedure Laterality Date  . Intraocular lens insertion    . Right knee surgery    . Repair of rectal fistula     HPI:  Patient is an 82  y.o. male with PMH:  hyperlipidemia, hypertension, CAD, MI, cerebral atherosclerosis, CVA, TIA, osteoarthritis, asthma who was brought to the emergency department via EMS due to strokelike symptoms.   Assessment / Plan / Recommendation Clinical Impression  Patient's cognitive-linguistic and speech function are all within functional to normal limits. Patient reported that he had some "confused speech" which wife confirmed and said it was  not slurred, but more like confused language. Patient also reported some right arm weakness this past December, but it went away. Patient did not exhibit any facial asymetry or oral-motor weakness or decreased ROM, cognitive function appeared normal for verbal problem solving and reasoning. Patient did not exhibit any impairment of speech intelligiibility, and no comprehension or expressive language impairments. Patient's spouse and daughter in law both stated that he appears to be at normal level for cognitive abilities.    SLP Assessment  Patient does not need any further Speech Lanaguage Pathology Services    Follow Up Recommendations  None    Frequency and Duration           SLP Evaluation Prior Functioning  Cognitive/Linguistic Baseline: Within functional limits Type of Home: House Available Help at Discharge: Family;Available 24 hours/day   Cognition  Overall Cognitive Status: Within Functional Limits for tasks assessed Arousal/Alertness: Awake/alert Orientation Level: Oriented X4    Comprehension  Auditory Comprehension Overall Auditory Comprehension: Appears within functional limits for tasks assessed Visual Recognition/Discrimination Discrimination: Not tested Reading Comprehension Reading Status: Not tested    Expression Expression Primary Mode of Expression: Verbal Verbal Expression  Overall Verbal Expression: Appears within functional limits for tasks assessed Written Expression Dominant Hand: Left Written Expression: Not tested    Oral / Motor  Oral Motor/Sensory Function Overall Oral Motor/Sensory Function: Within functional limits Motor Speech Overall Motor Speech: Appears within functional limits for tasks assessed Respiration: Within functional limits Phonation: Normal Resonance: Within functional limits Articulation: Within functional limitis Intelligibility: Intelligible Motor Planning: Witnin functional limits Motor Speech Errors: Not applicable   GO          Functional Limitations: Spoken language expressive Spoken Language Comprehension Current Status [phone]): 0 percent impaired, limited or restricted Spoken Language Comprehension Goal Status (G9160): 0 percent impaired, limited or restricted Spoken Language Comprehension Discharge Status [id number]): 0 percent impaired, limited or restricted Spoken Language Expression Current Status [phone]): 0 percent impaired, limited or restricted Spoken Language Expression Goal Status [id number]): 0 percent impaired, limited or restricted Spoken Language Expression Discharge Status [phone]): 0 percent impaired, limited or restricted Memory Current Status [id number]): 0 percent impaired, limited or restricted Memory Goal Status [id number]): 0 percent impaired, limited or restricted Memory Discharge Status [id number]): 0 percent impaired, limited or restricted         [name] 12/06/2015, 1:38 PM    Sonia Baller, MA, CCC-SLP 12/06/2015 1:39 PM

## 2015-12-06 NOTE — H&P (Signed)
History and Physical    [name] [id number] DOB: 10-Jun-1930 DOA: 12/05/2015  Referring MD/NP/PA: Carmin Ortega, M.D. PCP: Matthew File, MD  Outpatient Specialists: Matthew Fila, MD (neurology)                                          Matthew Breeding, MD (cardiology) Patient coming from: Home.  Chief Complaint: Stroke like symptoms.  HPI: [name] is a 82 y.o. male with medical history significant for hyperlipidemia, hypertension, CAD, MI, cerebral atherosclerosis, CVA, TIA, osteoarthritis, asthma who was brought to the emergency department via EMS due to strokelike symptoms.  Per patient and his wife, around 68, the patient had sudden onset of expressive aphasia without slurred speech, vision changes, cranial nerves, motor, sensorial or cerebellar deficits. His wife subsequently called EMS and he was brought to [facility] ED where a code stroke was initiated, however by that time the symptoms had resolved, no TPA was given, since the patient had an NIHSS score of 0. Symptoms lasted between 2-2-1/2 hours.  Patient stated that he has been having a headache, but he had a basal cell CA skin lesions size from his right frontal temporal area.  ED Course: CVA workup was begun, which so far has been benign.  Review of Systems: As per HPI otherwise 10 point review of systems negative.    Past Medical History  Diagnosis Date  . DYSLIPIDEMIA   . HYPERTENSION   . CORONARY ATHEROSCLEROSIS NATIVE CORONARY ARTERY     Myocardial infarction at [facility] in 1996 with a directional atherectomy of the 90% LAD stenosis and medical management of 60-70% proximal circumflex and 50% RCA stenosis.  . Cerebral atherosclerosis   . UPPER RESPIRATORY INFECTION   . ILIAC ARTERY ANEURYSM   . Diabetes mellitus without complication ([place])   . Arthritis   . Asthma   . TIA (transient ischemic attack)   . Myocardial infarction ([place]) 1996  . Stroke ([place])   . History of rotator cuff  surgery     Past Surgical History  Procedure Laterality Date  . Intraocular lens insertion    . Right knee surgery    . Repair of rectal fistula       reports that he quit smoking about 31 years ago. He has never used smokeless tobacco. He reports that he does not drink alcohol or use illicit drugs.  Allergies  Allergen Reactions  . Codeine Other (See Comments)    Bad dreams  . Lopid [Gemfibrozil] Nausea Only  . Voltaren [Diclofenac Sodium] Diarrhea    Family History  Problem Relation Age of Onset  . Coronary artery disease Mother   . Cancer Father     lung  . Stroke Father   . Cancer Brother     lung  . Stroke Sister     Prior to Admission medications   Medication Sig Start Date End Date Taking? Authorizing Provider  albuterol (PROAIR HFA) 108 (90 BASE) MCG/ACT inhaler INHALE TWO PUFFS INTO LUNGS 4 TIMES DAILY AS NEEDED 02/08/15  Yes Chipper Herb, MD  benazepril (LOTENSIN) 10 MG tablet Take 10 mg by mouth daily. 11/18/15  Yes Historical Provider, MD  Cholecalciferol (VITAMIN D3) 50000 UNITS CAPS Take 1 capsule by mouth once a week. Patient taking differently: Take 1 capsule by mouth once a week. On Friday 07/28/15  Yes Sherley Bounds  Wendi Snipes, MD  clopidogrel (PLAVIX) 75 MG tablet Take 1 tablet (75 mg total) by mouth daily. 10/14/15  Yes Timmothy Euler, MD  fish oil-omega-3 fatty acids 1000 MG capsule Take 2 g by mouth daily.     Yes Historical Provider, MD  Fluticasone-Salmeterol (ADVAIR DISKUS) 100-50 MCG/DOSE AEPB INHALE ONE DOSE BY MOUTH EVERY 12 HOURS AS NEEDED 02/08/15  Yes Chipper Herb, MD  hydrochlorothiazide (HYDRODIURIL) 25 MG tablet TAKE ONE-HALF TABLET BY MOUTH ONCE DAILY EXCEPT  ON  MONDAY,  [facility]  AND  FRIDAY  TAKE  A  WHOLE  TABLET 02/08/15  Yes Chipper Herb, MD  metFORMIN (GLUCOPHAGE-XR) 500 MG 24 hr tablet Take 1 tablet (500 mg total) by mouth 2 (two) times daily. 02/08/15  Yes Chipper Herb, MD  metoprolol (LOPRESSOR) 100 MG tablet Take 1 tablet (100 mg  total) by mouth 2 (two) times daily. 02/08/15  Yes Chipper Herb, MD  nitroGLYCERIN (NITROSTAT) 0.4 MG SL tablet Place 0.4 mg under the tongue every 5 (five) minutes as needed for chest pain.   Yes Historical Provider, MD  rosuvastatin (CRESTOR) 10 MG tablet Take 1 tablet (10 mg total) by mouth daily. 02/08/15  Yes Chipper Herb, MD  benazepril (LOTENSIN) 20 MG tablet Take 1 tablet (20 mg total) by mouth daily. Patient not taking: Reported on 12/05/2015 05/23/15   Timmothy Euler, MD  NITROSTAT 0.4 MG SL tablet USE AS DIRECTED Patient not taking: Reported on 12/05/2015 09/25/15   Timmothy Euler, MD    Physical Exam: Filed Vitals:   12/05/15 2215 12/05/15 2230 12/05/15 2300 12/06/15 0050  BP: 171/87 171/93 177/95 152/73  Pulse: 72 72 66 66  Temp:    98.1 F (36.7 C)  TempSrc:      Resp: 19 14 14 18   Height:      Weight:      SpO2: 95% 96% 94% 97%      Constitutional: NAD, calm, comfortable Filed Vitals:   12/05/15 2215 12/05/15 2230 12/05/15 2300 12/06/15 0050  BP: 171/87 171/93 177/95 152/73  Pulse: 72 72 66 66  Temp:    98.1 F (36.7 C)  TempSrc:      Resp: 19 14 14 18   Height:      Weight:      SpO2: 95% 96% 94% 97%   Eyes: PERRL, lids and conjunctivae normal ENMT: Mucous membranes are moist. Posterior pharynx clear of any exudate or lesions.Normal dentition.  Neck: normal, supple, no masses, no thyromegaly Respiratory: clear to auscultation bilaterally, no wheezing, no crackles. Normal respiratory effort. No accessory muscle use.  Cardiovascular: Regular rate and rhythm, 1/6 systolic murmur / rubs / gallops. 1+ extremity edema. 2+ pedal pulses. No carotid bruits.  Abdomen: no tenderness, no masses palpated. No hepatosplenomegaly. Bowel sounds positive.  Musculoskeletal: no clubbing / cyanosis. No joint deformity upper and lower extremities. Good ROM, no contractures. Normal muscle tone.  Skin: Right frontal temporal area dressing in place. Neurologic: CN 2-12 grossly  intact. Sensation intact, DTR normal. Strength 5/5 in all 4.  Psychiatric: Normal judgment and insight. Alert and oriented x 3. Normal mood.    Labs on Admission: I have personally reviewed following labs and imaging studies  CBC:  Recent Labs Lab 12/05/15 2131 12/05/15 2141  WBC 7.8  --   NEUTROABS 5.0  --   HGB 14.6 15.3  HCT 41.6 45.0  MCV 94.5  --   PLT 163  --    Basic Metabolic  Panel:  Recent Labs Lab 12/05/15 2131 12/05/15 2141  NA 136 137  K 3.7 3.7  CL 99* 97*  CO2 25  --   GLUCOSE 173* 165*  BUN 15 16  CREATININE 1.18 1.10  CALCIUM 9.7  --    GFR: Estimated Creatinine Clearance: 54.2 mL/min (by C-G formula based on Cr of 1.1). Liver Function Tests:  Recent Labs Lab 12/05/15 2131  AST 20  ALT 19  ALKPHOS 58  BILITOT 1.0  PROT 6.7  ALBUMIN 4.0   Coagulation Profile:  Recent Labs Lab 12/05/15 2131  INR 1.06   CBG:  Recent Labs Lab 12/05/15 2218  GLUCAP 180*   Urine analysis:    Component Value Date/Time   BILIRUBINUR neg 05/02/2014 0956   PROTEINUR neg 05/02/2014 0956   UROBILINOGEN negative 05/02/2014 0956   NITRITE neg 05/02/2014 0956   LEUKOCYTESUR moderate (2+) 05/02/2014 0956    Radiological Exams on Admission: Dg Chest 2 View  12/05/2015  CLINICAL DATA:  TIA tonight.  Hypertension.  Previous smoker. EXAM: CHEST  2 VIEW COMPARISON:  06/02/2012 FINDINGS: Mild hyperinflation suggesting emphysema. Central interstitial pattern to the lungs indicating chronic bronchitis. Linear fibrosis in the lung bases. No focal airspace disease or consolidation. No blunting of costophrenic angles. No pneumothorax. Mediastinal contours appear intact. Calcified and tortuous aorta. Normal heart size and pulmonary vascularity. Degenerative changes in the spine. IMPRESSION: Mild emphysematous and chronic bronchitic changes in the lungs. No evidence of active pulmonary disease. Electronically Signed   By: Lucienne Capers M.D.   On: 12/05/2015 23:57   Ct  Head Wo Contrast  12/05/2015  CLINICAL DATA:  Code stroke. EXAM: CT HEAD WITHOUT CONTRAST TECHNIQUE: Contiguous axial images were obtained from the base of the skull through the vertex without intravenous contrast. COMPARISON:  07/16/2015; brain MRI -07/16/2015 FINDINGS: Scattered periventricular hypodensities compatible with microvascular ischemic disease. The gray-white differentiation is otherwise well maintained without CT evidence of acute large territory infarct. No intraparenchymal or extra-axial mass or hemorrhage. Unchanged size and configuration of the ventricles and basilar cisterns. No midline shift. Intracranial atherosclerosis. Limited visualization of the paranasal sinuses and mastoid air cells is normal. No air-fluid levels. Regional soft tissues appear normal. No displaced calvarial fracture. Post bilateral cataract surgery. IMPRESSION: Similar findings of mild ischemic disease without acute intracranial process. Critical Value/emergent results were called by telephone at the time of interpretation on 12/05/2015 at 9:43 pm to Dr. Nicole Kindred, who verbally acknowledged these results. Electronically Signed   By: Sandi Mariscal M.D.   On: 12/05/2015 21:47   Mr Jodene Nam Head Wo Contrast  12/06/2015  CLINICAL DATA:  Acute onset speech difficulty. History of hypertension, dyslipidemia. EXAM: MRA HEAD WITHOUT CONTRAST TECHNIQUE: Angiographic images of the Circle of Willis were obtained using MRA technique without intravenous contrast. COMPARISON:  CT HEAD Dec 05, 2015 at 2141 hours FINDINGS: Mild motion degraded examination. Anterior circulation: Normal flow related enhancement of the included cervical, petrous, cavernous and supraclinoid internal carotid arteries. 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 Patent anterior communicating artery. Normal flow related enhancement of the anterior and middle cerebral arteries, including distal segments. Supernumerary anterior cerebral artery arising  from LEFT A1-2 junction. No large vessel occlusion, high-grade stenosis, abnormal luminal irregularity. Posterior circulation: Codominant vertebral arteries. Basilar artery is patent, with normal flow related enhancement of the main branch vessels. Normal flow related enhancement of the posterior cerebral arteries. Fetal origin RIGHT posterior cerebral artery. LEFT posterior communicating artery is patent. Loss of flow related  enhancement LEFT posterior cerebral artery P2 segment on maximum intensity projected reformations though the vessel is patent on raw data. No large vessel occlusion, high-grade stenosis, abnormal luminal irregularity, aneurysm. IMPRESSION: No emergent large vessel occlusion or high-grade stenosis on this mildly motion degraded examination. 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 Electronically Signed   By: Elon Alas M.D.   On: 12/06/2015 00:19  Echocardiogram 07/17/2015  ------------------------------------------------------------------- LV EF: 60% - 65%  ------------------------------------------------------------------- Indications: CVA 436.  ------------------------------------------------------------------- History: PMH: Coronary artery disease. Risk factors: Hypertension. Diabetes mellitus. Dyslipidemia.  ------------------------------------------------------------------- Study Conclusions  - Left ventricle: The cavity size was normal. Wall thickness was  normal. Systolic function was normal. The estimated ejection  fraction was in the range of 60% to 65%. Wall motion was normal;  there were no regional wall motion abnormalities. Doppler  parameters are consistent with abnormal left ventricular  relaxation (grade 1 diastolic dysfunction). Doppler parameters  are consistent with high ventricular filling pressure. - Aortic valve: Valve area (VTI): 1.68 cm^2. Valve area (Vmax):  1.56 cm^2. Valve area (Vmean): 1.33 cm^2. - Mitral  valve: Calcified annulus. Mildly thickened leaflets .  EKG: Independently reviewed.  Vent. rate 69 BPM PR interval 176 ms QRS duration 90 ms QT/QTc 406/435 ms P-R-T axes 53 4 9 Sinus rhythm Atrial premature complex Baseline wander in lead(s) II III aVF  Assessment/Plan Principal Problem:   TIA (transient ischemic attack)   History of  Cerebral atherosclerosis Admit to telemetry/observation. Frequent neuro checks. Echocardiogram and carotid Doppler done recently. Check fasting lipids and hemoglobin A1c. Neurology is following.  Active Problems:   Hyperlipidemia LDL goal <70 Continue statin. Check fasting lipids.  Monitor LFTs periodically.    Essential hypertension Hold antihypertensives for now. Resume once cleared by neurology.    CORONARY ATHEROSCLEROSIS NATIVE CORONARY ARTERY Continue Crestor and Plavix. Resume beta blocker once reevaluated and cleared by neuro.       DM type 2 (diabetes mellitus, type 2) (HCC) Carbohydrate modified/heart healthy diet. Check hemoglobin A1c. Continue metformin 500 mg by mouth twice a day CBG monitoring before meals.   DVT prophylaxis: Lovenox. Code Status: Full code. Family Communication: The patient's wife was present in the room. Disposition Plan: Admit for further evaluation. Consults called: Neurology Wallie Char, M.D.) Admission status: Observation/telemetry.   Reubin Milan MD [facility] Pager [phone]  If 7PM-7AM, please contact night-coverage [email or web address] Password [email or web address]  12/06/2015, 2:05 AM

## 2015-12-06 NOTE — Progress Notes (Signed)
STROKE TEAM PROGRESS NOTE   HISTORY OF PRESENT ILLNESS [name] is an 82 y.o. male with a history of hypertension, hyperlipidemia, diabetes mellitus, stroke and myocardial infarction, brought to the emergency room in code stroke status following acute onset of speech output difficulty. Patient's wife indicated that he had severe difficulty with getting his words out. Speech was not noticeably slurred. There was no facial droop. He also had no focal extremity weakness. He's been taking Plavix daily since a stroke in December 2016. CT scan of his head showed no acute intracranial abnormality. Patient's speech difficulty subsequently resolved. His NIH stroke score at the time of this evaluation was 0. He was LKW 7:00 PM on 12/04/2012. Patient was not administered IV t-PA secondary to deficits resolved. He was admitted for further evaluation and treatment.   SUBJECTIVE (INTERVAL HISTORY) His wife and daughter are at the bedside.  Overall he feels his condition is stable. Hyperosmolarity recounted detailed history from patient and his wife. He had 2 separate constant episodes of expressive language difficulties  In the last few days. This is similar to his prior episode in December last year where he was changed from Aggrenox to Plavix.   OBJECTIVE Temp:  [98.1 F (36.7 C)-98.2 F (36.8 C)] 98.2 F (36.8 C) (05/05 0514) Pulse Rate:  [66-76] 73 (05/05 0903) Cardiac Rhythm:  [-] Normal sinus rhythm (05/05 0700) Resp:  [14-19] 18 (05/05 0903) BP: (152-185)/(73-95) 156/86 mmHg (05/05 0514) SpO2:  [92 %-97 %] 92 % (05/05 0903) Weight:  [89 kg (196 lb 3.4 oz)-89.9 kg (198 lb 3.1 oz)] 89.9 kg (198 lb 3.1 oz) (05/05 0514)  CBC:  Recent Labs Lab 12/05/15 2131 12/05/15 2141  WBC 7.8  --   NEUTROABS 5.0  --   HGB 14.6 15.3  HCT 41.6 45.0  MCV 94.5  --   PLT 163  --     Basic Metabolic Panel:  Recent Labs Lab 12/05/15 2131 12/05/15 2141  NA 136 137  K 3.7 3.7  CL 99* 97*  CO2 25  --    GLUCOSE 173* 165*  BUN 15 16  CREATININE 1.18 1.10  CALCIUM 9.7  --     Lipid Panel:    Component Value Date/Time   CHOL 115 12/06/2015 0525   CHOL 132 02/02/2013 0853   TRIG 233* 12/06/2015 0525   TRIG 123 02/08/2015 1009   TRIG 172* 02/02/2013 0853   HDL 36* 12/06/2015 0525   HDL 41 02/08/2015 1009   HDL 33* 02/02/2013 0853   CHOLHDL 3.2 12/06/2015 0525   VLDL 47* 12/06/2015 0525   LDLCALC 32 12/06/2015 0525   LDLCALC 49 05/02/2014 0934   LDLCALC 65 02/02/2013 0853   HgbA1c:  Lab Results  Component Value Date   HGBA1C 6.4* 07/16/2015   Urine Drug Screen: No results found for: LABOPIA, COCAINSCRNUR, LABBENZ, AMPHETMU, THCU, LABBARB    IMAGING  Dg Chest 2 View  12/05/2015  CLINICAL DATA:  TIA tonight.  Hypertension.  Previous smoker. EXAM: CHEST  2 VIEW COMPARISON:  06/02/2012 FINDINGS: Mild hyperinflation suggesting emphysema. Central interstitial pattern to the lungs indicating chronic bronchitis. Linear fibrosis in the lung bases. No focal airspace disease or consolidation. No blunting of costophrenic angles. No pneumothorax. Mediastinal contours appear intact. Calcified and tortuous aorta. Normal heart size and pulmonary vascularity. Degenerative changes in the spine. IMPRESSION: Mild emphysematous and chronic bronchitic changes in the lungs. No evidence of active pulmonary disease. Electronically Signed   By: Oren Beckmann.D.  On: 12/05/2015 23:57   Ct Head Wo Contrast  12/05/2015  CLINICAL DATA:  Code stroke. EXAM: CT HEAD WITHOUT CONTRAST TECHNIQUE: Contiguous axial images were obtained from the base of the skull through the vertex without intravenous contrast. COMPARISON:  07/16/2015; brain MRI -07/16/2015 FINDINGS: Scattered periventricular hypodensities compatible with microvascular ischemic disease. The gray-white differentiation is otherwise well maintained without CT evidence of acute large territory infarct. No intraparenchymal or extra-axial mass or  hemorrhage. Unchanged size and configuration of the ventricles and basilar cisterns. No midline shift. Intracranial atherosclerosis. Limited visualization of the paranasal sinuses and mastoid air cells is normal. No air-fluid levels. Regional soft tissues appear normal. No displaced calvarial fracture. Post bilateral cataract surgery. IMPRESSION: Similar findings of mild ischemic disease without acute intracranial process. Critical Value/emergent results were called by telephone at the time of interpretation on 12/05/2015 at 9:43 pm to Dr. Nicole Kindred, who verbally acknowledged these results. Electronically Signed   By: Sandi Mariscal M.D.   On: 12/05/2015 21:47   Matthew Jodene Nam Head Wo Contrast  12/06/2015  CLINICAL DATA:  Acute onset speech difficulty. History of hypertension, dyslipidemia. EXAM: MRA HEAD WITHOUT CONTRAST TECHNIQUE: Angiographic images of the Circle of Willis were obtained using MRA technique without intravenous contrast. COMPARISON:  CT HEAD Dec 05, 2015 at 2141 hours FINDINGS: Mild motion degraded examination. Anterior circulation: Normal flow related enhancement of the included cervical, petrous, cavernous and supraclinoid internal carotid arteries. 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 Patent anterior communicating artery. Normal flow related enhancement of the anterior and middle cerebral arteries, including distal segments. Supernumerary anterior cerebral artery arising from LEFT A1-2 junction. No large vessel occlusion, high-grade stenosis, abnormal luminal irregularity. Posterior circulation: Codominant vertebral arteries. Basilar artery is patent, with normal flow related enhancement of the main branch vessels. Normal flow related enhancement of the posterior cerebral arteries. Fetal origin RIGHT posterior cerebral artery. LEFT posterior communicating artery is patent. Loss of flow related enhancement LEFT posterior cerebral artery P2 segment on maximum intensity projected  reformations though the vessel is patent on raw data. No large vessel occlusion, high-grade stenosis, abnormal luminal irregularity, aneurysm. IMPRESSION: No emergent large vessel occlusion or high-grade stenosis on this mildly motion degraded examination. 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 Electronically Signed   By: Elon Alas M.D.   On: 12/06/2015 00:19       PHYSICAL EXAM Pleasant elderly Caucasian male currently not in distress. Right temporal bandage from recent melanoma surgery. . Afebrile. Head is nontraumatic. Neck is supple without bruit.    Cardiac exam no murmur or gallop. Lungs are clear to auscultation. Distal pulses are well felt. Neurological Exam ;  Awake  Alert oriented x 3. Normal speech and language.eye movements full without nystagmus.fundi were not visualized. Vision acuity and fields appear normal. Hearing is normal. Palatal movements are normal. Face symmetric. Tongue midline. Normal strength, tone, reflexes and coordination. Normal sensation. Gait deferred. ASSESSMENT/PLAN Matthew. JAYLYNN Ortega is a 82 y.o. male with history of hypertension, hyperlipidemia, diabetes mellitus, stroke and myocardial infarction presenting with acute onset of speech difficulty. He did not receive IV t-PA due to deficits resolved.   L brain Stroke vs TIA  Resultant  Expressive aphasia resolved  MRI  pending   MRA  No emergent large vessel disease, 1-86mm R carotid terminus blister aneurysm  Carotid Doppler  pending   2D Echo  Last one done 12/16 EF 60-65% No source of embolus   LDL 32  HgbA1c 6.4 in Dec  Lovenox 40 mg sq daily for VTE prophylaxis  Diet NPO time specified  clopidogrel 75 mg daily prior to admission, now on clopidogrel 75 mg daily. Had been on Aggrenox in the past. No indication to change at this time. Continue Plavix at discharge  Patient counseled to be compliant with his antithrombotic medications  Ongoing aggressive stroke risk factor  management  Therapy recommendations:  No therapy needs  Disposition:  Return home  Hypertension  Stable Permissive hypertension (OK if < 220/120) but gradually normalize in 5-7 days  Hyperlipidemia  Home meds:  Crestor 10  LDL 32, goal < 70  Consider Crestor dose reduction  Continue statin at discharge  Diabetes type II  HgbA1c 6.4 in December, at goal < 7.0  Other Stroke Risk Factors  Advanced age  Former Cigarette smoker  Hx stroke/TIA  07/2015 L internal capsule infarct s/p IV tPA secondary to small vessel disease. Changed aggrenox to plavix  2003 aphasia, improved [facility])  Coronary artery disease  Other Active Problems  R iliac artery aneurysm  Hospital day #   [place] for Pager information 12/06/2015 1:44 PM  I have personally examined this patient, reviewed notes, independently viewed imaging studies, participated in medical decision making and plan of care. I have made any additions or clarifications directly to the above note. Agree with note above. He presented with transient episode of speech disturbance likely expressive aphasia from left hemispheric TIA versus small infarct. He remains at risk for neurological worsening, recurrent stroke, TIA needs ongoing stroke evaluation and aggressive risk factor modification. I had a long discussion the patient and wife at the bedside and answered questions.Continue Plavix for stroke prevention for now. Greater than 50% time during this 25 minute visit was spent on counseling and coordination of care about stroke risk and prevention Antony Contras, MD Medical Director [place] Pager: [phone] 12/06/2015 3:23 PM    To contact Stroke Continuity provider, please refer to [email or web address] After hours, contact General Neurology

## 2015-12-06 NOTE — Evaluation (Signed)
Occupational Therapy Evaluation Patient Details Name: [name] MRN: [id number] DOB: 11-Apr-1930 Today's Date: 12/06/2015    History of Present Illness This 82 y.o. male admitted with sudden onset expressive aphasia thate improved in ED.  MRI showed suspected punctate acute Lt frontal subacute infarct.  PMH includes:  HTN, DM, MI, CVA, h/o rotator cuff surgery    Clinical Impression   Pt admitted with above. He demonstrates the below listed deficits and will benefit from continued OT to maximize safety and independence with BADLs.  Pt presents to OT with deficits with visual saccades and visual scanning - deficits improved somewhat with activities to challenge vision, but still considerably slower than the norm.   Anticipate, he will improve spontaneously, but will follow acutely.  Recommend OP OT ONLY if deficits persist.  Recommend no driving initially post discharge.  Will follow acutely.        Follow Up Recommendations  Outpatient OT (depending on progress )    Equipment Recommendations  None recommended by OT    Recommendations for Other Services       Precautions / Restrictions Precautions Precautions: Fall      Mobility Bed Mobility Overal bed mobility: Independent                Transfers Overall transfer level: Independent Equipment used: None                  Balance Overall balance assessment: No apparent balance deficits (not formally assessed)                                          ADL Overall ADL's : Modified independent                                             Vision Vision Assessment?: Yes Eye Alignment: Within Functional Limits Ocular Range of Motion: Within Functional Limits Alignment/Gaze Preference: Within Defined Limits Tracking/Visual Pursuits: Able to track stimulus in all quads without difficulty Saccades: Decreased speed of saccadic movement;Additional eye shifts occurred during  testing Visual Fields: No apparent deficits Additional Comments: saccades exceptionally slow  with undershooting noted.  Pt slow to scan to right to read room signs    Perception Perception Perception Tested?: Yes   Praxis Praxis Praxis tested?: Within functional limits    Pertinent Vitals/Pain Pain Assessment: No/denies pain     Hand Dominance Left   Extremity/Trunk Assessment Upper Extremity Assessment Upper Extremity Assessment: Overall WFL for tasks assessed   Lower Extremity Assessment Lower Extremity Assessment: Defer to PT evaluation   Cervical / Trunk Assessment Cervical / Trunk Assessment: Normal   Communication Communication Communication: Expressive difficulties (speech slow and halting at times )   Cognition Arousal/Alertness: Awake/alert Behavior During Therapy: WFL for tasks assessed/performed Overall Cognitive Status: Within Functional Limits for tasks assessed                     General Comments       Exercises       Shoulder Instructions      Home Living Family/patient expects to be discharged to:: Private residence Living Arrangements: Spouse/significant other Available Help at Discharge: Family;Available 24 hours/day Type of Home: House Home Access: Level entry     Home Layout:  One level     Bathroom Shower/Tub: Tub/shower unit;Curtain Shower/tub characteristics: Architectural technologist: Standard     Home Equipment: Cane - single point;Crutches          Prior Functioning/Environment Level of Independence: Independent with assistive device(s)        Comments: Pt denies h/o falls.  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  Wife manages finances     OT Diagnosis: Disturbance of vision   OT Problem List: Impaired vision/perception   OT Treatment/Interventions: Visual/perceptual remediation/compensation;Patient/family education    OT Goals(Current goals can be found in the care plan section) Acute Rehab OT  Goals Patient Stated Goal: to go home  OT Goal Formulation: With patient/family Time For Goal Achievement: 12/13/15 Potential to Achieve Goals: Good ADL Goals Additional ADL Goal #1: Pt will read one page of info independently with good speed  Additional ADL Goal #2: pt will superimpose structured scanning strategy to locate items in busy, unstructured environements independently.   OT Frequency: Min 2X/week   Barriers to D/C:            Co-evaluation              End of Session Nurse Communication: Mobility status  Activity Tolerance: Patient tolerated treatment well Patient left: in bed;with call bell/phone within reach;with family/visitor present   Time: 1250-1309 OT Time Calculation (min): 19 min Charges:  OT General Charges $OT Visit: 1 Procedure OT Evaluation $OT Eval Moderate Complexity: 1 Procedure G-Codes: OT G-codes **NOT FOR INPATIENT CLASS** Functional Limitation: Self care Self Care Current Status [id number]): At least 1 percent but less than 20 percent impaired, limited or restricted Self Care Goal Status [id number]): 0 percent impaired, limited or restricted  Kari Montero M 12/06/2015, 1:29 PM

## 2015-12-07 LAB — HEMOGLOBIN A1C
HEMOGLOBIN A1C: 6.3 % — AB (ref 4.8–5.6)
MEAN PLASMA GLUCOSE: 134 mg/dL

## 2015-12-09 ENCOUNTER — Telehealth: Payer: Self-pay | Admitting: Family Medicine

## 2015-12-09 ENCOUNTER — Telehealth: Payer: Self-pay | Admitting: Neurology

## 2015-12-09 NOTE — Telephone Encounter (Signed)
RN call patients wife Vanita Ingles about the norvasc. Rn stated on the last office note, it was not on the med list. Rn ask patients wife who prescribed it. PTs wife Vanita Ingles stated pts PCP prescribed the norvasc. Rn stated Dr.Sethi did not prescribed the norvasc, and the PCP would have to be notified of any issues. Pts wife verbalized understanding.

## 2015-12-09 NOTE — Telephone Encounter (Signed)
Patient reports worsening of tongue numbness that started Sunday.  An appointment was made for May 10th @ 8:30. Hanley Seamen the patient the option to go to the Emergency Room. Patient declined.   Advised that the nurse would call if there was any other questions.  Please call pt's wife to discuss if pt needs to come in or not.

## 2015-12-09 NOTE — Telephone Encounter (Signed)
Wife Vanita Ingles called, has a question regarding AMLODIPINE

## 2015-12-10 ENCOUNTER — Encounter (HOSPITAL_COMMUNITY): Payer: Self-pay | Admitting: *Deleted

## 2015-12-10 ENCOUNTER — Emergency Department (HOSPITAL_COMMUNITY): Payer: Medicare Other

## 2015-12-10 ENCOUNTER — Observation Stay (HOSPITAL_COMMUNITY)
Admission: EM | Admit: 2015-12-10 | Discharge: 2015-12-11 | Disposition: A | Discharge status: Home / Self Care | Payer: Medicare Other | Attending: Internal Medicine | Admitting: Internal Medicine

## 2015-12-10 ENCOUNTER — Telehealth: Payer: Self-pay | Admitting: *Deleted

## 2015-12-10 DIAGNOSIS — Z87891 Personal history of nicotine dependence: Secondary | ICD-10-CM | POA: Diagnosis not present

## 2015-12-10 DIAGNOSIS — E663 Overweight: Secondary | ICD-10-CM | POA: Insufficient documentation

## 2015-12-10 DIAGNOSIS — N183 Chronic kidney disease, stage 3 unspecified: Secondary | ICD-10-CM | POA: Diagnosis present

## 2015-12-10 DIAGNOSIS — Z7984 Long term (current) use of oral hypoglycemic drugs: Secondary | ICD-10-CM | POA: Insufficient documentation

## 2015-12-10 DIAGNOSIS — E1159 Type 2 diabetes mellitus with other circulatory complications: Secondary | ICD-10-CM | POA: Diagnosis present

## 2015-12-10 DIAGNOSIS — I2510 Atherosclerotic heart disease of native coronary artery without angina pectoris: Secondary | ICD-10-CM | POA: Diagnosis not present

## 2015-12-10 DIAGNOSIS — E785 Hyperlipidemia, unspecified: Secondary | ICD-10-CM | POA: Diagnosis not present

## 2015-12-10 DIAGNOSIS — R471 Dysarthria and anarthria: Secondary | ICD-10-CM

## 2015-12-10 DIAGNOSIS — Z6827 Body mass index (BMI) 27.0-27.9, adult: Secondary | ICD-10-CM | POA: Diagnosis not present

## 2015-12-10 DIAGNOSIS — Z7902 Long term (current) use of antithrombotics/antiplatelets: Secondary | ICD-10-CM | POA: Diagnosis not present

## 2015-12-10 DIAGNOSIS — I639 Cerebral infarction, unspecified: Secondary | ICD-10-CM | POA: Diagnosis not present

## 2015-12-10 DIAGNOSIS — I252 Old myocardial infarction: Secondary | ICD-10-CM | POA: Diagnosis not present

## 2015-12-10 DIAGNOSIS — E119 Type 2 diabetes mellitus without complications: Secondary | ICD-10-CM

## 2015-12-10 DIAGNOSIS — I10 Essential (primary) hypertension: Secondary | ICD-10-CM | POA: Diagnosis present

## 2015-12-10 DIAGNOSIS — I129 Hypertensive chronic kidney disease with stage 1 through stage 4 chronic kidney disease, or unspecified chronic kidney disease: Secondary | ICD-10-CM | POA: Diagnosis not present

## 2015-12-10 DIAGNOSIS — R4701 Aphasia: Secondary | ICD-10-CM | POA: Insufficient documentation

## 2015-12-10 DIAGNOSIS — E1149 Type 2 diabetes mellitus with other diabetic neurological complication: Secondary | ICD-10-CM

## 2015-12-10 DIAGNOSIS — E1169 Type 2 diabetes mellitus with other specified complication: Secondary | ICD-10-CM | POA: Diagnosis present

## 2015-12-10 DIAGNOSIS — I25110 Atherosclerotic heart disease of native coronary artery with unstable angina pectoris: Secondary | ICD-10-CM | POA: Diagnosis present

## 2015-12-10 DIAGNOSIS — Z79899 Other long term (current) drug therapy: Secondary | ICD-10-CM | POA: Diagnosis not present

## 2015-12-10 DIAGNOSIS — E1122 Type 2 diabetes mellitus with diabetic chronic kidney disease: Secondary | ICD-10-CM | POA: Insufficient documentation

## 2015-12-10 DIAGNOSIS — G459 Transient cerebral ischemic attack, unspecified: Secondary | ICD-10-CM

## 2015-12-10 DIAGNOSIS — R531 Weakness: Secondary | ICD-10-CM | POA: Diagnosis not present

## 2015-12-10 DIAGNOSIS — Z8673 Personal history of transient ischemic attack (TIA), and cerebral infarction without residual deficits: Secondary | ICD-10-CM | POA: Diagnosis not present

## 2015-12-10 DIAGNOSIS — R4781 Slurred speech: Secondary | ICD-10-CM | POA: Diagnosis not present

## 2015-12-10 DIAGNOSIS — E440 Moderate protein-calorie malnutrition: Secondary | ICD-10-CM | POA: Diagnosis present

## 2015-12-10 DIAGNOSIS — R29810 Facial weakness: Secondary | ICD-10-CM | POA: Diagnosis not present

## 2015-12-10 DIAGNOSIS — I739 Peripheral vascular disease, unspecified: Secondary | ICD-10-CM | POA: Diagnosis present

## 2015-12-10 HISTORY — DX: Other chronic pain: G89.29

## 2015-12-10 HISTORY — DX: Type 2 diabetes mellitus without complications: E11.9

## 2015-12-10 HISTORY — DX: Personal history of other diseases of the digestive system: Z87.19

## 2015-12-10 HISTORY — DX: Dorsalgia, unspecified: M54.9

## 2015-12-10 HISTORY — DX: Gastro-esophageal reflux disease without esophagitis: K21.9

## 2015-12-10 LAB — PROTIME-INR
INR: 1.11 (ref 0.00–1.49)
Prothrombin Time: 14.5 seconds (ref 11.6–15.2)

## 2015-12-10 LAB — DIFFERENTIAL
BASOS ABS: 0 10*3/uL (ref 0.0–0.1)
Basophils Relative: 0 %
Eosinophils Absolute: 0.2 10*3/uL (ref 0.0–0.7)
Eosinophils Relative: 3 %
LYMPHS ABS: 2.1 10*3/uL (ref 0.7–4.0)
LYMPHS PCT: 30 %
Monocytes Absolute: 0.6 10*3/uL (ref 0.1–1.0)
Monocytes Relative: 9 %
NEUTROS PCT: 58 %
Neutro Abs: 4 10*3/uL (ref 1.7–7.7)

## 2015-12-10 LAB — I-STAT CHEM 8, ED
BUN: 18 mg/dL (ref 6–20)
CALCIUM ION: 1.19 mmol/L (ref 1.13–1.30)
CREATININE: 1.2 mg/dL (ref 0.61–1.24)
Chloride: 96 mmol/L — ABNORMAL LOW (ref 101–111)
Glucose, Bld: 142 mg/dL — ABNORMAL HIGH (ref 65–99)
HCT: 50 % (ref 39.0–52.0)
Hemoglobin: 17 g/dL (ref 13.0–17.0)
Potassium: 3.7 mmol/L (ref 3.5–5.1)
Sodium: 135 mmol/L (ref 135–145)
TCO2: 24 mmol/L (ref 0–100)

## 2015-12-10 LAB — COMPREHENSIVE METABOLIC PANEL
ALBUMIN: 4.1 g/dL (ref 3.5–5.0)
ALK PHOS: 60 U/L (ref 38–126)
ALT: 24 U/L (ref 17–63)
AST: 26 U/L (ref 15–41)
Anion gap: 12 (ref 5–15)
BILIRUBIN TOTAL: 1.1 mg/dL (ref 0.3–1.2)
BUN: 16 mg/dL (ref 6–20)
CALCIUM: 10 mg/dL (ref 8.9–10.3)
CO2: 25 mmol/L (ref 22–32)
Chloride: 99 mmol/L — ABNORMAL LOW (ref 101–111)
Creatinine, Ser: 1.22 mg/dL (ref 0.61–1.24)
GFR calc Af Amer: >60 mL/min (ref >60)
GFR calc non Af Amer: 53 mL/min — ABNORMAL LOW (ref >60)
GLUCOSE: 149 mg/dL — AB (ref 65–99)
Potassium: 3.7 mmol/L (ref 3.5–5.1)
Sodium: 136 mmol/L (ref 135–145)
TOTAL PROTEIN: 7 g/dL (ref 6.5–8.1)

## 2015-12-10 LAB — CBC
HCT: 44.9 % (ref 39.0–52.0)
HEMOGLOBIN: 15.8 g/dL (ref 13.0–17.0)
MCH: 33 pg (ref 26.0–34.0)
MCHC: 35.2 g/dL (ref 30.0–36.0)
MCV: 93.7 fL (ref 78.0–100.0)
Platelets: 193 10*3/uL (ref 150–400)
RBC: 4.79 MIL/uL (ref 4.22–5.81)
RDW: 12.2 % (ref 11.5–15.5)
WBC: 6.9 10*3/uL (ref 4.0–10.5)

## 2015-12-10 LAB — APTT: APTT: 28 s (ref 24–37)

## 2015-12-10 LAB — I-STAT TROPONIN, ED: Troponin i, poc: 0 ng/mL (ref 0.00–0.08)

## 2015-12-10 LAB — GLUCOSE, CAPILLARY: Glucose-Capillary: 139 mg/dL — ABNORMAL HIGH (ref 65–99)

## 2015-12-10 MED ORDER — STROKE: EARLY STAGES OF RECOVERY BOOK
Freq: Once | Status: AC
  Administered 2015-12-10: 19:00:00

## 2015-12-10 MED ORDER — ENOXAPARIN SODIUM 40 MG/0.4ML ~~LOC~~ SOLN
40.0000 mg | SUBCUTANEOUS | Status: DC
  Administered 2015-12-10: 40 mg via SUBCUTANEOUS
  Filled 2015-12-10: qty 0.4

## 2015-12-10 MED ORDER — GI COCKTAIL ~~LOC~~
30.0000 mL | Freq: Once | ORAL | Status: AC
  Administered 2015-12-10: 30 mL via ORAL
  Filled 2015-12-10: qty 30

## 2015-12-10 MED ORDER — INSULIN ASPART 100 UNIT/ML ~~LOC~~ SOLN: 0.0000 [IU] | Freq: Every day | SUBCUTANEOUS | Status: DC

## 2015-12-10 MED ORDER — ASPIRIN EC 325 MG PO TBEC
325.0000 mg | DELAYED_RELEASE_TABLET | Freq: Every day | ORAL | Status: DC
  Administered 2015-12-10 – 2015-12-11 (×2): 325 mg via ORAL
  Filled 2015-12-10 (×2): qty 1

## 2015-12-10 MED ORDER — CLOPIDOGREL BISULFATE 75 MG PO TABS
75.0000 mg | ORAL_TABLET | Freq: Every day | ORAL | Status: DC
  Administered 2015-12-10 – 2015-12-11 (×2): 75 mg via ORAL
  Filled 2015-12-10 (×2): qty 1

## 2015-12-10 MED ORDER — INSULIN ASPART 100 UNIT/ML ~~LOC~~ SOLN
0.0000 [IU] | Freq: Three times a day (TID) | SUBCUTANEOUS | Status: DC
  Administered 2015-12-11: 1 [IU] via SUBCUTANEOUS
  Administered 2015-12-11: 2 [IU] via SUBCUTANEOUS

## 2015-12-10 MED ORDER — GADOBENATE DIMEGLUMINE 529 MG/ML IV SOLN
19.0000 mL | Freq: Once | INTRAVENOUS | Status: AC | PRN
  Administered 2015-12-10: 19 mL via INTRAVENOUS

## 2015-12-10 MED ORDER — LORAZEPAM 2 MG/ML IJ SOLN
1.0000 mg | Freq: Once | INTRAMUSCULAR | Status: AC
  Administered 2015-12-10: 1 mg via INTRAVENOUS
  Filled 2015-12-10: qty 1

## 2015-12-10 MED ORDER — ENSURE ENLIVE PO LIQD
237.0000 mL | Freq: Two times a day (BID) | ORAL | Status: DC
  Administered 2015-12-11 (×2): 237 mL via ORAL

## 2015-12-10 NOTE — H&P (Signed)
History and Physical    [name] [id number] DOB: [birth year] DOA: 12/10/2015  Referring MD/NP/PA: Reather Converse / ER PCP: Kenn File, MD  Outpatient Specialists: Leonie Man / Neurology Patient coming from: Private residence  Chief Complaint: Transient slurred speech  HPI: [name] is a 82 y.o. male with medical history significant for diabetes, chronic kidney disease, hypertension, dyslipidemia, CAD, peripheral vascular disease and recent admission for TIA symptoms with apparent stroke on MRI with symptoms resolved by date of discharge on 5/5. During that admission, carotid Dopplers without evidence ICA stenosis, hemoglobin A1c was normal at 6.4, echo was unremarkable, PT/OT/speech therapy evaluated patient and no home needs documented. An MRI of the brain confirmed suspected punctate acute left frontal subcortical infarct and he had documented expressive aphasia related to acute left brain stroke which was transient and symptoms had resolved prior to discharge. He was to follow-up with neurology in 2 months. Patient and wife report patient has had 2 episodes of dysarthria since discharge from the hospital. The first episode only lasted a few minutes but today's episode lasted more than 20 minutes and patient appearred to have issues related to both receptive and expressive aphasia and complaints of tongue numbness. The wife had also noted that with each episode of dysarthria since discharge his blood pressure spiked up higher than baseline. They also noted that he had slight right facial drooping, all of which has resolved since arrival to the hospital. He currently is back to baseline according to patient and family.  ED Course:  Afebrile-BP 130/88-pulse 67-respirations 15-room air saturations 95%-weight 196 pounds CT head without contrast: Reveal a patchy periventricular small vessel disease without intracranial mass hemorrhage or acute-appearing infarct. Ativan 1 mg IV for MRI  procedure  Review of Systems:  In addition to the HPI above,  No Fever-chills, myalgias or other constitutional symptoms No Headache, changes with Vision or hearing, new weakness, tingling, numbness in any extremity, but did have noticeable speech deficits and time numbness as described above No problems swallowing food or Liquids, indigestion/reflux No Chest pain, Cough or Shortness of Breath, palpitations, orthopnea or DOE No Abdominal pain, N/V; no melena or hematochezia, no dark tarry stools, Bowel movements are regular, No dysuria, hematuria or flank pain No new skin rashes, lesions, masses or bruises, No new joints pains-aches No recent weight gain or loss No polyuria, polydypsia or polyphagia,   Past Medical History  Diagnosis Date  . DYSLIPIDEMIA   . HYPERTENSION   . CORONARY ATHEROSCLEROSIS NATIVE CORONARY ARTERY     Myocardial infarction at [facility] in 1996 with a directional atherectomy of the 90% LAD stenosis and medical management of 60-70% proximal circumflex and 50% RCA stenosis.  . Cerebral atherosclerosis   . UPPER RESPIRATORY INFECTION   . ILIAC ARTERY ANEURYSM   . Diabetes mellitus without complication ([place])   . Arthritis   . Asthma   . TIA (transient ischemic attack)   . Myocardial infarction ([place]) 1996  . Stroke ([place])   . History of rotator cuff surgery     Past Surgical History  Procedure Laterality Date  . Intraocular lens insertion    . Right knee surgery    . Repair of rectal fistula       reports that he quit smoking about 31 years ago. He has never used smokeless tobacco. He reports that he does not drink alcohol or use illicit drugs.  Allergies  Allergen Reactions  . Codeine Other (See Comments)    Bad dreams  .  Lopid [Gemfibrozil] Nausea Only  . Voltaren [Diclofenac Sodium] Diarrhea    Family History  Problem Relation Age of Onset  . Coronary artery disease Mother   . Cancer Father     lung  . Stroke Father   . Cancer Brother      lung  . Stroke Sister      Prior to Admission medications   Medication Sig Start Date End Date Taking? Authorizing Provider  albuterol (PROAIR HFA) 108 (90 BASE) MCG/ACT inhaler INHALE TWO PUFFS INTO LUNGS 4 TIMES DAILY AS NEEDED 02/08/15  Yes Chipper Herb, MD  benazepril (LOTENSIN) 10 MG tablet Take 10 mg by mouth daily. 11/18/15  Yes Historical Provider, MD  Cholecalciferol (VITAMIN D3) 50000 UNITS CAPS Take 1 capsule by mouth once a week. Patient taking differently: Take 1 capsule by mouth once a week. On Friday 07/28/15  Yes Timmothy Euler, MD  clopidogrel (PLAVIX) 75 MG tablet Take 1 tablet (75 mg total) by mouth daily. 10/14/15  Yes Timmothy Euler, MD  fish oil-omega-3 fatty acids 1000 MG capsule Take 2 g by mouth daily.     Yes Historical Provider, MD  Fluticasone-Salmeterol (ADVAIR DISKUS) 100-50 MCG/DOSE AEPB INHALE ONE DOSE BY MOUTH EVERY 12 HOURS AS NEEDED Patient taking differently: Inhale 1 puff into the lungs every 12 (twelve) hours as needed (wheezing).  02/08/15  Yes Chipper Herb, MD  hydrochlorothiazide (HYDRODIURIL) 25 MG tablet TAKE ONE-HALF TABLET BY MOUTH ONCE DAILY EXCEPT  ON  MONDAY,  [facility]  AND  FRIDAY  TAKE  A  WHOLE  TABLET 02/08/15  Yes Chipper Herb, MD  metFORMIN (GLUCOPHAGE-XR) 500 MG 24 hr tablet Take 1 tablet (500 mg total) by mouth 2 (two) times daily. 02/08/15  Yes Chipper Herb, MD  metoprolol (LOPRESSOR) 100 MG tablet Take 1 tablet (100 mg total) by mouth 2 (two) times daily. 02/08/15  Yes Chipper Herb, MD  nitroGLYCERIN (NITROSTAT) 0.4 MG SL tablet Place 0.4 mg under the tongue every 5 (five) minutes as needed for chest pain.   Yes Historical Provider, MD  rosuvastatin (CRESTOR) 10 MG tablet Take 1 tablet (10 mg total) by mouth daily. 02/08/15  Yes Chipper Herb, MD    Physical Exam: Filed Vitals:   12/10/15 1230 12/10/15 1419 12/10/15 1420 12/10/15 1500  BP: 133/88 168/83 168/83 137/75  Pulse: 66 66 67 64  Temp:      TempSrc:      Resp: 15  16 14 15   Height:      Weight:      SpO2: 95% 96% 96% 100%      Constitutional: NAD, calm, comfortable Eyes: PERRL, lids and conjunctivae normal ENMT: Mucous membranes are moist. Posterior pharynx clear of any exudate or lesions.Normal dentition.  Neck: normal, supple, no masses, no thyromegaly Respiratory: clear to auscultation bilaterally, no wheezing, no crackles. Normal respiratory effort. No accessory muscle use.  Cardiovascular: Regular rate and rhythm, no murmurs / rubs / gallops. No extremity edema. 2+ pedal pulses. No carotid bruits.  Abdomen: no tenderness, no masses palpated. No hepatosplenomegaly. Bowel sounds positive.  Musculoskeletal: no clubbing / cyanosis. No joint deformity upper and lower extremities. Good ROM, no contractures. Normal muscle tone.  Skin: no rashes, lesions, ulcers. No induration Neurologic: CN 2-12 grossly intact. Sensation intact, DTR normal. Strength 5/5 x all 4 extremities.  Psychiatric: Alert and oriented x name and place but still somewhat confused as to day of week stating "I need to go home  tomorrow because it is Mother's Day". Normal mood.    Labs on Admission: I have personally reviewed following labs and imaging studies  CBC:  Recent Labs Lab 12/05/15 2131 12/05/15 2141 12/10/15 1220 12/10/15 1245  WBC 7.8  --  6.9  --   NEUTROABS 5.0  --  4.0  --   HGB 14.6 15.3 15.8 17.0  HCT 41.6 45.0 44.9 50.0  MCV 94.5  --  93.7  --   PLT 163  --  193  --    Basic Metabolic Panel:  Recent Labs Lab 12/05/15 2131 12/05/15 2141 12/10/15 1220 12/10/15 1245  NA 136 137 136 135  K 3.7 3.7 3.7 3.7  CL 99* 97* 99* 96*  CO2 25  --  25  --   GLUCOSE 173* 165* 149* 142*  BUN 15 16 16 18   CREATININE 1.18 1.10 1.22 1.20  CALCIUM 9.7  --  10.0  --    GFR: Estimated Creatinine Clearance: 49.7 mL/min (by C-G formula based on Cr of 1.2). Liver Function Tests:  Recent Labs Lab 12/05/15 2131 12/10/15 1220  AST 20 26  ALT 19 24  ALKPHOS  58 60  BILITOT 1.0 1.1  PROT 6.7 7.0  ALBUMIN 4.0 4.1   No results for input(s): LIPASE, AMYLASE in the last 168 hours. No results for input(s): AMMONIA in the last 168 hours. Coagulation Profile:  Recent Labs Lab 12/05/15 2131 12/10/15 1220  INR 1.06 1.11   Cardiac Enzymes: No results for input(s): CKTOTAL, CKMB, CKMBINDEX, TROPONINI in the last 168 hours. BNP (last 3 results) No results for input(s): PROBNP in the last 8760 hours. HbA1C: No results for input(s): HGBA1C in the last 72 hours. CBG:  Recent Labs Lab 12/05/15 2218 12/06/15 0827  GLUCAP 180* 149*   Lipid Profile: No results for input(s): CHOL, HDL, LDLCALC, TRIG, CHOLHDL, LDLDIRECT in the last 72 hours. Thyroid Function Tests: No results for input(s): TSH, T4TOTAL, FREET4, T3FREE, THYROIDAB in the last 72 hours. Anemia Panel: No results for input(s): VITAMINB12, FOLATE, FERRITIN, TIBC, IRON, RETICCTPCT in the last 72 hours. Urine analysis:    Component Value Date/Time   BILIRUBINUR neg 05/02/2014 0956   PROTEINUR neg 05/02/2014 0956   UROBILINOGEN negative 05/02/2014 0956   NITRITE neg 05/02/2014 0956   LEUKOCYTESUR moderate (2+) 05/02/2014 0956   Sepsis Labs: @LABRCNTIP (procalcitonin:4,lacticidven:4) )No results found for this or any previous visit (from the past 240 hour(s)).   Radiological Exams on Admission: Ct Head Wo Contrast  12/10/2015  CLINICAL DATA:  Transient slurred speech earlier today EXAM: CT HEAD WITHOUT CONTRAST TECHNIQUE: Contiguous axial images were obtained from the base of the skull through the vertex without intravenous contrast. COMPARISON:  Head CT Dec 05, 2015 and brain MRI Dec 06, 2015 FINDINGS: Mild diffuse atrophy is stable. There is no intracranial mass, hemorrhage, extra-axial fluid collection, or midline shift. There is patchy small vessel disease in the centra semiovale bilaterally. Elsewhere gray-white compartments appear normal. No acute infarct is appreciable by CT. The  bony calvarium appears intact. The mastoid air cells are clear. Orbits appear symmetric bilaterally. Mild debris is noted in each external auditory canal. IMPRESSION: Atrophy with patchy periventricular small vessel disease. No intracranial mass, hemorrhage, or acute appearing infarct evident. Mild apparent cerumen in each external auditory canal. Electronically Signed   By: Lowella Grip III M.D.   On: 12/10/2015 13:08    EKG: (Independently reviewed) sinus rhythm with borderline prolonged PR interval with biphasic P-wave, ventricular rate 66 bpm,  QTC 430 ms, no ischemic changes.  Assessment/Plan Principal Problem:   Dysarthria/History of recent stroke -Patient returns with transient dysarthria in setting of recent acute left frontal subcortical infarct despite discharge on Plavix and statinsI.e. appropriate medical therapy -Neurology has evaluated the patient and have already ordered stat MRI; patient in MRI at time of request for admission called by EDP (1530) -Patient finally returned from MRI by 1710 and initial interview and evaluation completed at that time as well as admission orders -No indication to repeat carotid duplex or echocardiogram nor hemoglobin A1c and lipid panel this admission -He will need additional PT/OT/SLP evaluations  -Continue statin and Plavix; ?? Add baby aspirin  Active Problems:   DM type 2 (diabetes mellitus, type 2) -Well controlled as evidenced by hemoglobin A1c 6.3 last admission -On metformin prior to admission and will hold acutely and utilize SSI only     Essential hypertension -Blood pressure well controlled on preadmission Lotensin, hydrochlorothiazide and Lopressor    CKD (chronic kidney disease) stage 3, GFR 30-59 ml/min -Renal function stable and at baseline of 16/1.22    Hyperlipidemia LDL goal <70 -Last admission cholesterol 1:15, triglycerides elevated at 233, HDL suboptimal at 36 with an LDL of 32 and VLDL 47 -Continue preadmission  Crestor    CORONARY ATHEROSCLEROSIS NATIVE CORONARY ARTERY/PVD  -Currently asymptomatic but both of these problems are risk factors for stroke      DVT prophylaxis: Lovenox Code Status: Full code  Family Communication: Wife [name] at bedside as well as daughter Disposition Plan: Discharged preadmission home environment Consults called: Neurology/[place] Admission status: Observation Mobility: Without assistive devices   Pepper Wyndham L. ANP-BC [facility] Pager [phone]   If 7PM-7AM, please contact night-coverage [email or web address] Password [facility]  12/10/2015, 3:43 PM

## 2015-12-10 NOTE — Telephone Encounter (Signed)
LFt vm that patients wife [name] that patient is okay to be seen on 12/11/2015 by Dr. Leonie Man. Rn left message if patient is having problems swallowing or numbness getting worse seek the nearest  ED.

## 2015-12-10 NOTE — ED Notes (Addendum)
Pt presents via [facility] from home with c/o TIA.  Pt reports around 1005 speech became slurred, EMS arrived at 1053 and symptoms had resolved.  Pt reports recently being admitted and worked up for stroke.  Pt also reports episode yesterday.   Neuro screen - per EMS.   A x 4, NAD, denies pain.  CBG-151, HR 60-80s.   Vitals WNL per EMS.

## 2015-12-10 NOTE — Progress Notes (Signed)
Patient admitted from ER. Patient alert and oriented x 4. Patient oriented to room and made comfortable. Tele placed.

## 2015-12-10 NOTE — ED Notes (Signed)
Neuro at bedside.

## 2015-12-10 NOTE — Telephone Encounter (Signed)
Agree with your plan. 

## 2015-12-10 NOTE — Telephone Encounter (Signed)
I called patient to discuss Transitional Care Management and to discuss how he was doing since being discharged from the hospital. Patient's wife stated that he was actively being transported to ED by EMS. I was able to review his medications with her but it appears that Amlodipine was d/c. She will follow up with [place] at a later time.

## 2015-12-10 NOTE — Telephone Encounter (Signed)
Spoke to patient's wife and he was actively being transported back to the Emergency Room by EMS. She will follow up as needed.

## 2015-12-10 NOTE — ED Provider Notes (Signed)
CSN: [id number]     Arrival date & time 12/10/15  1213 History   First MD Initiated Contact with Patient 12/10/15 1221     Chief Complaint  Patient presents with  . Transient Ischemic Attack     (Consider location/radiation/quality/duration/timing/severity/associated sxs/prior Treatment) HPI Comments: [name] is a 82 y.o. male with history of CVA, TIA, HTN, hyperlipidemia, CAD, T2DM, CKD, and PVD presents to ED via EMS for transient ischemic attack. Symptoms included aphasia, facial droop, and paraesthesia of lips. Symptoms were improving by the time EMS arrived and wife reports symptom resolution within 20 minutes. No fever, chills, night sweats. No current neurologic symptoms. No recent head trauma. No history of seizures.   Of note, wife endorses repeated TIAs the last three days; on Sunday - aphasia and paraesthesia in right hand, Monday - paraesthesia of lips. Symptoms resolved within 5 minutes. Furthermore, patient was admitted on 12/04/15 for similar symptoms and had stroke work up.  Patient compliant with Plavix.   The history is provided by the patient, the spouse, a relative and medical records.    Past Medical History  Diagnosis Date  . DYSLIPIDEMIA   . HYPERTENSION   . CORONARY ATHEROSCLEROSIS NATIVE CORONARY ARTERY     Myocardial infarction at [facility] in 1996 with a directional atherectomy of the 90% LAD stenosis and medical management of 60-70% proximal circumflex and 50% RCA stenosis.  . Cerebral atherosclerosis   . UPPER RESPIRATORY INFECTION   . ILIAC ARTERY ANEURYSM   . Diabetes mellitus without complication ([place])   . Arthritis   . Asthma   . TIA (transient ischemic attack)   . Myocardial infarction ([place]) 1996  . Stroke ([place])   . History of rotator cuff surgery    Past Surgical History  Procedure Laterality Date  . Intraocular lens insertion    . Right knee surgery    . Repair of rectal fistula     Family History  Problem Relation Age of Onset  .  Coronary artery disease Mother   . Cancer Father     lung  . Stroke Father   . Cancer Brother     lung  . Stroke Sister    Social History  Substance Use Topics  . Smoking status: Former Smoker    Quit date: 11/24/1984  . Smokeless tobacco: Never Used  . Alcohol Use: No    Review of Systems  Allergic/Immunologic: Positive for immunocompromised state ( T2DM).  Neurological:       Paraesthesia of lips, facial droop, and aphasia - resolved.    All other systems reviewed and are negative.     Allergies  Codeine; Lopid; and Voltaren  Home Medications   Prior to Admission medications   Medication Sig Start Date End Date Taking? Authorizing Provider  albuterol (PROAIR HFA) 108 (90 BASE) MCG/ACT inhaler INHALE TWO PUFFS INTO LUNGS 4 TIMES DAILY AS NEEDED 02/08/15  Yes Chipper Herb, MD  benazepril (LOTENSIN) 10 MG tablet Take 10 mg by mouth daily. 11/18/15  Yes Historical Provider, MD  Cholecalciferol (VITAMIN D3) 50000 UNITS CAPS Take 1 capsule by mouth once a week. Patient taking differently: Take 1 capsule by mouth once a week. On Friday 07/28/15  Yes Timmothy Euler, MD  clopidogrel (PLAVIX) 75 MG tablet Take 1 tablet (75 mg total) by mouth daily. 10/14/15  Yes Timmothy Euler, MD  fish oil-omega-3 fatty acids 1000 MG capsule Take 2 g by mouth daily.     Yes Historical  Provider, MD  Fluticasone-Salmeterol (ADVAIR DISKUS) 100-50 MCG/DOSE AEPB INHALE ONE DOSE BY MOUTH EVERY 12 HOURS AS NEEDED Patient taking differently: Inhale 1 puff into the lungs every 12 (twelve) hours as needed (wheezing).  02/08/15  Yes Chipper Herb, MD  hydrochlorothiazide (HYDRODIURIL) 25 MG tablet TAKE ONE-HALF TABLET BY MOUTH ONCE DAILY EXCEPT  ON  MONDAY,  [facility]  AND  FRIDAY  TAKE  A  WHOLE  TABLET 02/08/15  Yes Chipper Herb, MD  metFORMIN (GLUCOPHAGE-XR) 500 MG 24 hr tablet Take 1 tablet (500 mg total) by mouth 2 (two) times daily. 02/08/15  Yes Chipper Herb, MD  metoprolol (LOPRESSOR) 100 MG  tablet Take 1 tablet (100 mg total) by mouth 2 (two) times daily. 02/08/15  Yes Chipper Herb, MD  nitroGLYCERIN (NITROSTAT) 0.4 MG SL tablet Place 0.4 mg under the tongue every 5 (five) minutes as needed for chest pain.   Yes Historical Provider, MD  rosuvastatin (CRESTOR) 10 MG tablet Take 1 tablet (10 mg total) by mouth daily. 02/08/15  Yes Chipper Herb, MD   BP 137/75 mmHg  Pulse 64  Temp(Src) 97.4 F (36.3 C) (Oral)  Resp 15  Ht 5\' 11"  (1.803 m)  Wt 88.905 kg  BMI 27.35 kg/m2  SpO2 100% Physical Exam  Constitutional: He is oriented to person, place, and time. He appears well-developed and well-nourished. No distress.  HENT:  Head: Normocephalic and atraumatic.  Mouth/Throat: Oropharynx is clear and moist. No oropharyngeal exudate.  Eyes: Conjunctivae and EOM are normal. Pupils are equal, round, and reactive to light. Right eye exhibits no discharge. Left eye exhibits no discharge. No scleral icterus.  Neck: Normal range of motion. Neck supple.  Cardiovascular: Normal rate, regular rhythm and normal heart sounds.   No murmur heard. Difficult to obtain DP pulses. Capillary refill <3 seconds.   Pulmonary/Chest: Effort normal and breath sounds normal. No respiratory distress.  Abdominal: Soft. Bowel sounds are normal. There is no tenderness. There is no rebound and no guarding.  Musculoskeletal: Normal range of motion.  Lymphadenopathy:    He has no cervical adenopathy.  Neurological: He is alert and oriented to person, place, and time. He has normal strength. He is not disoriented. No cranial nerve deficit or sensory deficit. He displays a negative Romberg sign. Coordination normal. GCS eye subscore is 4. GCS verbal subscore is 5. GCS motor subscore is 6.  Strength intact in all extremities b/l. Intact finger to nose. Intact knee to shin.   Skin: Skin is warm and dry. He is not diaphoretic.  Psychiatric: He has a normal mood and affect. His behavior is normal.    ED Course   Procedures (including critical care time) Labs Review Labs Reviewed  COMPREHENSIVE METABOLIC PANEL - Abnormal; Notable for the following:    Chloride 99 (*)    Glucose, Bld 149 (*)    GFR calc non Af Amer 53 (*)    All other components within normal limits  I-STAT CHEM 8, ED - Abnormal; Notable for the following:    Chloride 96 (*)    Glucose, Bld 142 (*)    All other components within normal limits  PROTIME-INR  APTT  CBC  DIFFERENTIAL  HEMOGLOBIN A1C  LIPID PANEL  I-STAT TROPOININ, ED  CBG MONITORING, ED    Imaging Review Ct Head Wo Contrast  12/10/2015  CLINICAL DATA:  Transient slurred speech earlier today EXAM: CT HEAD WITHOUT CONTRAST TECHNIQUE: Contiguous axial images were obtained from the base of  the skull through the vertex without intravenous contrast. COMPARISON:  Head CT Dec 05, 2015 and brain MRI Dec 06, 2015 FINDINGS: Mild diffuse atrophy is stable. There is no intracranial mass, hemorrhage, extra-axial fluid collection, or midline shift. There is patchy small vessel disease in the centra semiovale bilaterally. Elsewhere gray-white compartments appear normal. No acute infarct is appreciable by CT. The bony calvarium appears intact. The mastoid air cells are clear. Orbits appear symmetric bilaterally. Mild debris is noted in each external auditory canal. IMPRESSION: Atrophy with patchy periventricular small vessel disease. No intracranial mass, hemorrhage, or acute appearing infarct evident. Mild apparent cerumen in each external auditory canal. Electronically Signed   By: Lowella Grip III M.D.   On: 12/10/2015 13:08   I have personally reviewed and evaluated these images and lab results as part of my medical decision-making.   EKG Interpretation   Date/Time:  Tuesday Dec 10 2015 12:17:37 EDT Ventricular Rate:  66 PR Interval:  201 QRS Duration: 85 QT Interval:  410 QTC Calculation: 430 R Axis:   -28 Text Interpretation:  Sinus rhythm Ventricular premature  complex Confirmed  by ZAVITZ MD, Vonna Kotyk [phone]) on 12/10/2015 2:22:57 PM      MDM   Final diagnoses:  Transient cerebral ischemia, unspecified transient cerebral ischemia type   [name] is a 82 y.o. male presents to ED via EMS with complaint of TIA. History is remarkable for CVA, TIA, HTN, T2DM, CKD, CAD, and PVD. Symptoms included aphasia, facial droop, and paraesthesias lasting approximately 20 minutes. Wife endorses additional TIAs on Sunday and Monday lasting approximately 5 minutes. Was admitted 12/04/15 for stroke like symptoms - imaging on 12/05/15 and 12/06/15 showed punctate left frontal subcortical ischemic disease and no occlusions or stenosis.   Patient is afebrile, non-toxic, and vital signs are stable. Neurologic exam within normal limits. Romberg negative. Coags, CMP, CBC unremarkable. Troponin negative. EKG shows sinus rhythm with PVC. CT head negative for intracranial mass, hemorrhage, or acute infarct. MR Brain w/o contrast and MR angiogram of neck ordered - pending. Based on h/o of symptoms and PMH of CVA and TIA, suspect TIA. Will consult neurology.   1:58 PM : Dr. Reather Converse consulted neurology.  3:19 PM: Neurology recommends admission to observation for 24hours 3:31 PM: Dr. Reather Converse consulted [facility] for admission for further observation and management.      Roxanna Mew, PA-C 12/10/15 1716  Elnora Morrison, MD 12/11/15 [phone]

## 2015-12-10 NOTE — ED Notes (Signed)
PA at bedside.

## 2015-12-10 NOTE — ED Notes (Signed)
Patient transported to MRI 

## 2015-12-10 NOTE — ED Notes (Signed)
MD at bedside. 

## 2015-12-10 NOTE — Telephone Encounter (Signed)
Patient's wife called stating the patient's tongue has been numb and his speech was not right. I advised to take the patient to the ER and she agreed.

## 2015-12-10 NOTE — Consult Note (Signed)
Requesting Physician: Dr. Steffanie Dunn    Chief Complaint: TIA   HPI:                                                                                                                                         [name] is an 82 y.o. male who was recently in the hospital on 5.4.2017 for the same symptoms he presents with today. At that time there was question of tIA versus stroke. HE was followed up by stroke team the next day and through "with Dr. Leonie Man, neurology who indicated that since patient had completed stroke workup with 2-D echo and carotid Dopplers in December 2016, no need to repeat or do any further workup. LDL 32. (Was 108 in December 2016). Hemoglobin A1c 07/16/15:6.4. 2-D echo 07/17/15: LVEF 60-65 percent & grade 1 diastolic dysfunction. Carotid Dopplers 07/16/15: Bilateral 1-39 percent ICA stenosis. Vertebral artery flow is antegrade." patient did have MRI/MRA 5.5.2017 showing a punctate left frontal subcortical infarct.  Patient now returns to hospital 4 days later due to transient slurred speech. Currently in NSR with frequent PVC and PAC. Currently on plavix and takes this daily.   Date last known well: today Time last known well: Time: 10:02 tPA Given: No: symptoms resolved.    Past Medical History  Diagnosis Date  . DYSLIPIDEMIA   . HYPERTENSION   . CORONARY ATHEROSCLEROSIS NATIVE CORONARY ARTERY     Myocardial infarction at [facility] in 1996 with a directional atherectomy of the 90% LAD stenosis and medical management of 60-70% proximal circumflex and 50% RCA stenosis.  . Cerebral atherosclerosis   . UPPER RESPIRATORY INFECTION   . ILIAC ARTERY ANEURYSM   . Diabetes mellitus without complication ([place])   . Arthritis   . Asthma   . TIA (transient ischemic attack)   . Myocardial infarction ([place]) 1996  . Stroke ([place])   . History of rotator cuff surgery     Past Surgical History  Procedure Laterality Date  . Intraocular lens insertion    . Right knee surgery    .  Repair of rectal fistula      Family History  Problem Relation Age of Onset  . Coronary artery disease Mother   . Cancer Father     lung  . Stroke Father   . Cancer Brother     lung  . Stroke Sister    Social History:  reports that he quit smoking about 31 years ago. He has never used smokeless tobacco. He reports that he does not drink alcohol or use illicit drugs.  Allergies:  Allergies  Allergen Reactions  . Codeine Other (See Comments)    Bad dreams  . Lopid [Gemfibrozil] Nausea Only  . Voltaren [Diclofenac Sodium] Diarrhea    Medications:  Current Facility-Administered Medications  Medication Dose Route Frequency Provider Last Rate Last Dose  . LORazepam (ATIVAN) injection 1 mg  1 mg Intravenous Once Roxanna Mew, PA-C       Current Outpatient Prescriptions  Medication Sig Dispense Refill  . albuterol (PROAIR HFA) 108 (90 BASE) MCG/ACT inhaler INHALE TWO PUFFS INTO LUNGS 4 TIMES DAILY AS NEEDED 54 each 3  . benazepril (LOTENSIN) 10 MG tablet Take 10 mg by mouth daily.    . Cholecalciferol (VITAMIN D3) 50000 UNITS CAPS Take 1 capsule by mouth once a week. (Patient taking differently: Take 1 capsule by mouth once a week. On Friday) 12 capsule 3  . clopidogrel (PLAVIX) 75 MG tablet Take 1 tablet (75 mg total) by mouth daily. 30 tablet 2  . fish oil-omega-3 fatty acids 1000 MG capsule Take 2 g by mouth daily.      . Fluticasone-Salmeterol (ADVAIR DISKUS) 100-50 MCG/DOSE AEPB INHALE ONE DOSE BY MOUTH EVERY 12 HOURS AS NEEDED (Patient taking differently: Inhale 1 puff into the lungs every 12 (twelve) hours as needed (wheezing). ) 180 each 3  . hydrochlorothiazide (HYDRODIURIL) 25 MG tablet TAKE ONE-HALF TABLET BY MOUTH ONCE DAILY EXCEPT  ON  MONDAY,  WEDNESDAY  AND  FRIDAY  TAKE  A  WHOLE  TABLET 90 tablet 3  . metFORMIN (GLUCOPHAGE-XR) 500 MG 24 hr  tablet Take 1 tablet (500 mg total) by mouth 2 (two) times daily. 180 tablet 3  . metoprolol (LOPRESSOR) 100 MG tablet Take 1 tablet (100 mg total) by mouth 2 (two) times daily. 180 tablet 3  . nitroGLYCERIN (NITROSTAT) 0.4 MG SL tablet Place 0.4 mg under the tongue every 5 (five) minutes as needed for chest pain.    . rosuvastatin (CRESTOR) 10 MG tablet Take 1 tablet (10 mg total) by mouth daily. 90 tablet 3     ROS:                                                                                                                                       History obtained from the patient  General ROS: negative for - chills, fatigue, fever, night sweats, weight gain or weight loss Psychological ROS: negative for - behavioral disorder, hallucinations, memory difficulties, mood swings or suicidal ideation Ophthalmic ROS: negative for - blurry vision, double vision, eye pain or loss of vision ENT ROS: negative for - epistaxis, nasal discharge, oral lesions, sore throat, tinnitus or vertigo Allergy and Immunology ROS: negative for - hives or itchy/watery eyes Hematological and Lymphatic ROS: negative for - bleeding problems, bruising or swollen lymph nodes Endocrine ROS: negative for - galactorrhea, hair pattern changes, polydipsia/polyuria or temperature intolerance Respiratory ROS: negative for - cough, hemoptysis, shortness of breath or wheezing Cardiovascular ROS: negative for - chest pain, dyspnea on exertion, edema or irregular heartbeat Gastrointestinal ROS: negative for - abdominal pain, diarrhea, hematemesis, nausea/vomiting or stool incontinence Genito-Urinary ROS:  negative for - dysuria, hematuria, incontinence or urinary frequency/urgency Musculoskeletal ROS: negative for - joint swelling or muscular weakness Neurological ROS: as noted in HPI Dermatological ROS: negative for rash and skin lesion changes  Neurologic Examination:                                                                                                       Blood pressure 133/88, pulse 66, temperature 97.4 F (36.3 C), temperature source Oral, resp. rate 15, height 5\' 11"  (1.803 m), weight 88.905 kg (196 lb), SpO2 95 %.  HEENT-  Normocephalic, no lesions, without obvious abnormality.  Normal external eye and conjunctiva.  Normal TM's bilaterally.  Normal auditory canals and external ears. Normal external nose, mucus membranes and septum.  Normal pharynx. Cardiovascular- irregularly irregular rhythm, pulses palpable throughout   Lungs- no tachypnea, retractions or cyanosis Abdomen- normal findings: bowel sounds normal Extremities- no edema Lymph-no adenopathy palpable Musculoskeletal-no joint tenderness, deformity or swelling Skin-warm and dry, no hyperpigmentation, vitiligo, or suspicious lesions  Neurological Examination Mental Status: Alert, oriented, thought content appropriate.  Speech fluent without evidence of aphasia.  Able to follow 3 step commands without difficulty. Cranial Nerves: II:  Visual fields grossly normal, pupils equal, round, reactive to light and accommodation III,IV, VI: ptosis not present, extra-ocular motions intact bilaterally V,VII: smile symmetric, facial light touch sensation normal bilaterally VIII: hearing normal bilaterally IX,X: uvula rises symmetrically XI: bilateral shoulder shrug XII: midline tongue extension Motor: Right : Upper extremity   5/5    Left:     Upper extremity   5/5  Lower extremity   5/5     Lower extremity   5/5 Tone and bulk:normal tone throughout; no atrophy noted Sensory: Pinprick and light touch intact throughout, bilaterally Deep Tendon Reflexes: 2+ and symmetric throughout Plantars: Right: downgoing   Left: downgoing Cerebellar: normal finger-to-nose,  and normal heel-to-shin test Gait: not tested       Lab Results: Basic Metabolic Panel:  Recent Labs Lab 12/05/15 2131 12/05/15 2141 12/10/15 1220 12/10/15 1245  NA 136 137 136  135  K 3.7 3.7 3.7 3.7  CL 99* 97* 99* 96*  CO2 25  --  25  --   GLUCOSE 173* 165* 149* 142*  BUN 15 16 16 18   CREATININE 1.18 1.10 1.22 1.20  CALCIUM 9.7  --  10.0  --     Liver Function Tests:  Recent Labs Lab 12/05/15 2131 12/10/15 1220  AST 20 26  ALT 19 24  ALKPHOS 58 60  BILITOT 1.0 1.1  PROT 6.7 7.0  ALBUMIN 4.0 4.1   No results for input(s): LIPASE, AMYLASE in the last 168 hours. No results for input(s): AMMONIA in the last 168 hours.  CBC:  Recent Labs Lab 12/05/15 2131 12/05/15 2141 12/10/15 1220 12/10/15 1245  WBC 7.8  --  6.9  --   NEUTROABS 5.0  --  4.0  --   HGB 14.6 15.3 15.8 17.0  HCT 41.6 45.0 44.9 50.0  MCV 94.5  --  93.7  --   PLT 163  --  193  --     Cardiac Enzymes: No results for input(s): CKTOTAL, CKMB, CKMBINDEX, TROPONINI in the last 168 hours.  Lipid Panel:  Recent Labs Lab 12/06/15 0525  CHOL 115  TRIG 233*  HDL 36*  CHOLHDL 3.2  VLDL 47*  LDLCALC 32    CBG:  Recent Labs Lab 12/05/15 2218 12/06/15 0827  GLUCAP 180* 149*    Microbiology: Results for orders placed or performed in visit on 02/11/15  Fecal occult blood, imunochemical     Status: None   Collection Time: 02/11/15  9:34 AM  Result Value Ref Range Status   Fecal Occult Bld Negative Negative Final    Coagulation Studies:  Recent Labs  12/10/15 1220  LABPROT 14.5  INR 1.11    Imaging: Ct Head Wo Contrast  12/10/2015  CLINICAL DATA:  Transient slurred speech earlier today EXAM: CT HEAD WITHOUT CONTRAST TECHNIQUE: Contiguous axial images were obtained from the base of the skull through the vertex without intravenous contrast. COMPARISON:  Head CT Dec 05, 2015 and brain MRI Dec 06, 2015 FINDINGS: Mild diffuse atrophy is stable. There is no intracranial mass, hemorrhage, extra-axial fluid collection, or midline shift. There is patchy small vessel disease in the centra semiovale bilaterally. Elsewhere gray-white compartments appear normal. No acute infarct  is appreciable by CT. The bony calvarium appears intact. The mastoid air cells are clear. Orbits appear symmetric bilaterally. Mild debris is noted in each external auditory canal. IMPRESSION: Atrophy with patchy periventricular small vessel disease. No intracranial mass, hemorrhage, or acute appearing infarct evident. Mild apparent cerumen in each external auditory canal. Electronically Signed   By: Lowella Grip III M.D.   On: 12/10/2015 13:08       Assessment and plan discussed with with attending physician and they are in agreement.    Etta Quill PA-C [facility] [phone]  12/10/2015, 2:06 PM   Assessment: 82 y.o. male with a history of recent stroke with recurrent TIA like episodes. I suspect stuttering small vessel disease and will start dual anti-platelet therapy. I think observation overnight is reasonable, and will perform vascular imaging.   Stroke Risk Factors - hyperlipidemia and hypertension  1. LDL 108, a1c 6.3, no need to repeat 2. MRI, MRA  of the brain without contrast 3. Frequent neuro checks 4. Echocardiogram 7. Prophylactic therapy-Antiplatelet med: Aspirin - dose 325mg  PO or 300mg  PR and plavix 75 mg daily.  7. Risk factor modification 8. Telemetry monitoring 9. please page stroke NP  Or  PA  Or MD  M-F from 8am -4 pm starting 5/10 as this patient will be followed by the stroke team at this point.   You can look them up on [email or web address]   10. Therapies not needed unless he re-worsens  Roland Rack, MD [facility] [phone]  If 7pm- 7am, please page neurology on call as listed in [place].

## 2015-12-11 ENCOUNTER — Other Ambulatory Visit: Payer: Self-pay | Admitting: Student

## 2015-12-11 ENCOUNTER — Observation Stay (HOSPITAL_COMMUNITY)
Admit: 2015-12-11 | Discharge: 2015-12-11 | Disposition: A | Discharge status: Home / Self Care | Payer: Medicare Other | Attending: Neurology | Admitting: Neurology

## 2015-12-11 ENCOUNTER — Ambulatory Visit: Payer: Medicare Other | Admitting: Neurology

## 2015-12-11 DIAGNOSIS — E1159 Type 2 diabetes mellitus with other circulatory complications: Secondary | ICD-10-CM

## 2015-12-11 DIAGNOSIS — E440 Moderate protein-calorie malnutrition: Secondary | ICD-10-CM | POA: Diagnosis present

## 2015-12-11 DIAGNOSIS — N183 Chronic kidney disease, stage 3 (moderate): Secondary | ICD-10-CM | POA: Diagnosis not present

## 2015-12-11 DIAGNOSIS — I2510 Atherosclerotic heart disease of native coronary artery without angina pectoris: Secondary | ICD-10-CM | POA: Diagnosis not present

## 2015-12-11 DIAGNOSIS — I10 Essential (primary) hypertension: Secondary | ICD-10-CM | POA: Diagnosis not present

## 2015-12-11 DIAGNOSIS — I639 Cerebral infarction, unspecified: Secondary | ICD-10-CM

## 2015-12-11 DIAGNOSIS — I739 Peripheral vascular disease, unspecified: Secondary | ICD-10-CM

## 2015-12-11 DIAGNOSIS — R471 Dysarthria and anarthria: Secondary | ICD-10-CM | POA: Diagnosis not present

## 2015-12-11 DIAGNOSIS — I6389 Other cerebral infarction: Secondary | ICD-10-CM

## 2015-12-11 LAB — GLUCOSE, CAPILLARY
GLUCOSE-CAPILLARY: 127 mg/dL — AB (ref 65–99)
GLUCOSE-CAPILLARY: 125 mg/dL — AB (ref 65–99)
Glucose-Capillary: 166 mg/dL — ABNORMAL HIGH (ref 65–99)

## 2015-12-11 LAB — HEMOGLOBIN A1C
HEMOGLOBIN A1C: 6.1 % — AB (ref 4.8–5.6)
Mean Plasma Glucose: 128 mg/dL

## 2015-12-11 MED ORDER — ENSURE ENLIVE PO LIQD: 237.0000 mL | Freq: Two times a day (BID) | ORAL | Status: DC

## 2015-12-11 MED ORDER — ASPIRIN 325 MG PO TBEC: 325.0000 mg | DELAYED_RELEASE_TABLET | Freq: Every day | ORAL | Status: DC

## 2015-12-11 MED ORDER — ASPIRIN EC 81 MG PO TBEC: 81.0000 mg | DELAYED_RELEASE_TABLET | Freq: Every day | ORAL | Status: AC

## 2015-12-11 MED ORDER — ALBUTEROL SULFATE (2.5 MG/3ML) 0.083% IN NEBU: 2.5000 mg | INHALATION_SOLUTION | RESPIRATORY_TRACT | Status: DC | PRN

## 2015-12-11 NOTE — Progress Notes (Signed)
EEG completed, results pending. 

## 2015-12-11 NOTE — Care Management Obs Status (Signed)
[place] NOTIFICATION   Patient Details  Name: [name] MRN: [id number] Date of Birth: 1930-07-10   Medicare Observation Status Notification Given:  Yes (negative MRI)    Pollie Friar, RN 12/11/2015, 10:58 AM

## 2015-12-11 NOTE — Progress Notes (Signed)
Initial Nutrition Assessment  DOCUMENTATION CODES:   Non-severe (moderate) malnutrition in context of acute illness/injury  INTERVENTION:  Continue Ensure Enlive po BID, each supplement provides 350 kcal and 20 grams of protein Reviewed heart healthy eating tips  NUTRITION DIAGNOSIS:   Inadequate oral intake related to poor appetite as evidenced by per patient/family report, moderate depletions of muscle mass.   GOAL:   Patient will meet greater than or equal to 90% of their needs   MONITOR:   PO intake, Labs, Weight trends  REASON FOR ASSESSMENT:   Malnutrition Screening Tool    ASSESSMENT:   82 y.o. male with a Past Medical History of SLE, HTN, CAD, CVA, iliac artery aneurysm, DM, asthma, MI who presents with recurrent dysarthria. Recent admission for TIA.  Pt states that he used to weigh 222 lbs a couple years ago and has gradually lost weight to 196 lbs. He reports eating 50% less than usual due to having a poor appetite. He has lost an additional 9 lbs (5% wt loss) in the past 2 weeks per weight history and he has moderate muscle wasting per nutrition-focused physical exam- acute moderate malnutrition. He reports that he started drinking Ensure recently. RD encouraged pt to continue with Ensure as needed to stabilize weight. Answered pt and family's questions regarding heart healthy diet.   Labs: glucose ranging 127 to 166 mg/dL, high triglycerides, low HDL  Diet Order:  Diet heart healthy/carb modified Room service appropriate?: Yes; Fluid consistency:: Thin Diet - low sodium heart healthy Diet Carb Modified  Skin:  Wound (see comment) (closed incision on right head)  Last BM:  5/9  Height:   Ht Readings from Last 1 Encounters:  12/10/15 5\' 11"  (1.803 m)    Weight:   Wt Readings from Last 1 Encounters:  12/10/15 184 lb 4.8 oz (83.598 kg)    Ideal Body Weight:  78.18 kg  BMI:  Body mass index is 25.72 kg/(m^2).  Estimated Nutritional Needs:   Kcal:   [id number]  Protein:  100-110 grams  Fluid:  2-2.3 L/day  EDUCATION NEEDS:   No education needs identified at this time  [place], LDN Inpatient Clinical Dietitian Pager: [phone] After Hours Pager: [phone]

## 2015-12-11 NOTE — Procedures (Signed)
History: 82 yo M with transient episodes of slurred speech  Sedation: None  Technique: This is a 21 channel routine scalp EEG performed at the bedside with bipolar and monopolar montages arranged in accordance to the international 10/20 system of electrode placement. One channel was dedicated to EKG recording.    Background: The background consists of intermixed alpha and beta activities. There is a well defined posterior dominant rhythm of 9 Hz that attenuates with eye opening. Sleep is recorded with normal appearing structures.   Photic stimulation: Physiologic driving is not performed  EEG Abnormalities: None  Clinical Interpretation: This normal EEG is recorded in the waking and sleep state. There was no seizure or seizure predisposition recorded on this study. Please note that a normal EEG does not preclude the possibility of epilepsy.   Roland Rack, MD [facility] [phone]  If 7pm- 7am, please page neurology on call as listed in [place].

## 2015-12-11 NOTE — Care Management Note (Signed)
Case Management Note  Patient Details  Name: [name] MRN: [id number] Date of Birth: 1929-11-28  Subjective/Objective:                    Action/Plan: Patient discharging home with self care. No further needs per CM.   Expected Discharge Date:                  Expected Discharge Plan:  Home/Self Care  In-House Referral:     Discharge planning Services     Post Acute Care Choice:    Choice offered to:     DME Arranged:    DME Agency:     HH Arranged:    [place] Agency:     Status of Service:  Completed, signed off  Medicare Important Message Given:    Date Medicare IM Given:    Medicare IM give by:    Date Additional Medicare IM Given:    Additional Medicare Important Message give by:     If discussed at [place] of Stay Meetings, dates discussed:    Additional Comments:  Pollie Friar, RN 12/11/2015, 4:41 PM

## 2015-12-11 NOTE — Progress Notes (Signed)
D/C orders received, pt for D/C home today.  IV and telemetry D/C.  Rx and D/C instructions given with verbalized understanding.  Family at bedside to assist with D/C.  Staff brought pt downstairs via wheelchair.  

## 2015-12-11 NOTE — Care Management Note (Signed)
Case Management Note  Patient Details  Name: [name] MRN: [id number] Date of Birth: 22-Jul-1930  Subjective/Objective:                    Action/Plan: Patient presented with transient slurred speech. Lives at home with spouse. Will follow for discharge needs pending PT/OT evals and physician orders. Expected Discharge Date:                  Expected Discharge Plan:     In-House Referral:     Discharge planning Services     Post Acute Care Choice:    Choice offered to:     DME Arranged:    DME Agency:     HH Arranged:    HH Agency:     Status of Service:  In process, will continue to follow  Medicare Important Message Given:    Date Medicare IM Given:    Medicare IM give by:    Date Additional Medicare IM Given:    Additional Medicare Important Message give by:     If discussed at [place] of Stay Meetings, dates discussed:    Additional CommentsRolm Baptise, RN 12/11/2015, 9:54 AM [phone]

## 2015-12-11 NOTE — Progress Notes (Signed)
STROKE TEAM PROGRESS NOTE   HISTORY OF PRESENT ILLNESS [name] is an 82 y.o. male who was recently in the hospital on 5.4.2017 for the same symptoms he presents with today. At that time there was question of tIA versus stroke. HE was followed up by stroke team the next day and through "with Dr. Leonie Man, neurology who indicated that since patient had completed stroke workup with 2-D echo and carotid Dopplers in December 2016, no need to repeat or do any further workup. LDL 32. (Was 108 in December 2016). Hemoglobin A1c 07/16/15:6.4. 2-D echo 07/17/15: LVEF 60-65 percent & grade 1 diastolic dysfunction. Carotid Dopplers 07/16/15: Bilateral 1-39 percent ICA stenosis. Vertebral artery flow is antegrade." patient did have MRI/MRA 5.5.2017 showing a punctate left frontal subcortical infarct. Patient now returns to hospital 4 days later due to transient slurred speech. Currently in NSR with frequent PVC and PAC. Currently on plavix and takes this daily. he was last known well 12/10/2015 at 10:02. Patient was not administered IV t-PA secondary to  symptoms resolved. He was admitted for further evaluation and treatment.   SUBJECTIVE (INTERVAL HISTORY) His wife and son are at the bedside.  Overall he feels his condition is completely resolved today. They are worried about his recurrent stroke symptoms.  Pt admitted intermittent heart palpitation, especially at night going to bed. Short lasting. Due to several episodes of aphasia, and MRI left MCA punctate infarct, feel like afib work up is warranted. Pt and family optioned to do 30 day monitoring instead of TEE/loop. Will continue DAPT. Pt needs EEG due to recurrent stereotype symptoms.    OBJECTIVE Temp:  [97.4 F (36.3 C)-98.1 F (36.7 C)] 98 F (36.7 C) (05/10 0904) Pulse Rate:  [64-81] 81 (05/10 0904) Cardiac Rhythm:  [-] Normal sinus rhythm (05/09 2124) Resp:  [14-20] 20 (05/10 0904) BP: (107-168)/(52-88) 136/72 mmHg (05/10 0904) SpO2:  [94 %-100  %] 98 % (05/10 0904) Weight:  [83.598 kg (184 lb 4.8 oz)-88.905 kg (196 lb)] 83.598 kg (184 lb 4.8 oz) (05/09 1842)  CBC:  Recent Labs Lab 12/05/15 2131  12/10/15 1220 12/10/15 1245  WBC 7.8  --  6.9  --   NEUTROABS 5.0  --  4.0  --   HGB 14.6  < > 15.8 17.0  HCT 41.6  < > 44.9 50.0  MCV 94.5  --  93.7  --   PLT 163  --  193  --   < > = values in this interval not displayed.  Basic Metabolic Panel:  Recent Labs Lab 12/05/15 2131  12/10/15 1220 12/10/15 1245  NA 136  < > 136 135  K 3.7  < > 3.7 3.7  CL 99*  < > 99* 96*  CO2 25  --  25  --   GLUCOSE 173*  < > 149* 142*  BUN 15  < > 16 18  CREATININE 1.18  < > 1.22 1.20  CALCIUM 9.7  --  10.0  --   < > = values in this interval not displayed.  Lipid Panel:    Component Value Date/Time   CHOL 115 12/06/2015 0525   CHOL 132 02/02/2013 0853   TRIG 233* 12/06/2015 0525   TRIG 123 02/08/2015 1009   TRIG 172* 02/02/2013 0853   HDL 36* 12/06/2015 0525   HDL 41 02/08/2015 1009   HDL 33* 02/02/2013 0853   CHOLHDL 3.2 12/06/2015 0525   VLDL 47* 12/06/2015 0525   LDLCALC 32 12/06/2015 0525  LDLCALC 49 05/02/2014 0934   LDLCALC 65 02/02/2013 0853   HgbA1c:  Lab Results  Component Value Date   HGBA1C 6.1* 12/10/2015   Urine Drug Screen: No results found for: LABOPIA, COCAINSCRNUR, LABBENZ, AMPHETMU, THCU, LABBARB    IMAGING  Ct Head Wo Contrast 12/10/2015  Atrophy with patchy periventricular small vessel disease. No intracranial mass, hemorrhage, or acute appearing infarct evident. Mild apparent cerumen in each external auditory canal.    Mr [name] Wo Contrast 12/10/2015    No significant intracranial stenosis   Mr Angiogram Neck W Wo Contrast 12/10/2015    No significant carotid or vertebral artery stenosis in the neck. Possible plaque ulceration proximal right internal carotid artery.   Mr Brain Wo Contrast 12/10/2015   Negative for acute infarct. Possible small acute infarct left frontal white matter seen on  12/06/2015 no longer visualized and may have been a small infarct or artifact Atrophy and chronic microvascular ischemia   EEG - This normal EEG is recorded in the waking and sleep state. There was no seizure or seizure predisposition recorded on this study. Please note that a normal EEG does not preclude the possibility of epilepsy.    PHYSICAL EXAM  Temp:  [97.5 F (36.4 C)-98.1 F (36.7 C)] 98 F (36.7 C) (05/10 0904) Pulse Rate:  [67-81] 81 (05/10 0904) Resp:  [15-20] 20 (05/10 0904) BP: (107-136)/(52-79) 136/72 mmHg (05/10 0904) SpO2:  [94 %-98 %] 98 % (05/10 0904) Weight:  [184 lb 4.8 oz (83.598 kg)] 184 lb 4.8 oz (83.598 kg) (05/09 1842)  General - Well nourished, well developed, in no apparent distress.  Ophthalmologic - Fundi not visualized due to eye movement.  Cardiovascular - Regular rate and rhythm with no murmur.  Mental Status -  Level of arousal and orientation to time, place, and person were intact. Language including expression, naming, repetition, comprehension was assessed and found intact. Attention span and concentration were impaired and not able to spell backward. Fund of Knowledge was assessed and was intact.  Cranial Nerves II - XII - II - Visual field intact OU. III, IV, VI - Extraocular movements intact. V - Facial sensation intact bilaterally. VII - Facial movement intact bilaterally. VIII - Hearing & vestibular intact bilaterally. X - Palate elevates symmetrically. XI - Chin turning & shoulder shrug intact bilaterally. XII - Tongue protrusion intact.  Motor Strength - The patient's strength was normal in all extremities and pronator drift was absent.  Bulk was normal and fasciculations were absent.   Motor Tone - Muscle tone was assessed at the neck and appendages and was normal.  Reflexes - The patient's reflexes were 1+ in all extremities and he had no pathological reflexes.  Sensory - Light touch, temperature/pinprick were assessed and were  symmetrical.    Coordination - The patient had normal movements in the hands and feet with no ataxia or dysmetria.  Tremor was absent.  Gait and Station - not tested due to safety concerns.   ASSESSMENT/PLAN [name] is a 82 y.o. male with history of HTN, HLD, CAD/MI, DM admitted for punctate L frontal subcortical infarct last week presenting with transient slurred speech. He did not receive IV t-PA due to resolved symptoms.   L brain TIA - with recurrent expressive aphasia episodes and heart palpitation, will need to rule out afib as stroke etiology. Due to stereotype symptoms, also needs to rule out seizure. Pt denies HA or migraine hx.   Resultant  Neuro deficits resolved  MRI  No new infarct  MRI 12/05/15 showed left MCA subcortical punctate infarcts.  MRA head  No significant stenosis  MRA neck  No significant stenosis, possible R ICA plaque ulceration  EEG normal  LDL 32  HgbA1c 6.1  Lovenox 40 mg sq daily for VTE prophylaxis  Diet heart healthy/carb modified Room service appropriate?: Yes; Fluid consistency:: Thin  clopidogrel 75 mg daily prior to admission, now on aspirin 325 mg daily and clopidogrel 75 mg daily. Ok to decrease aspirin to 81 mg daily. Given dual antiplatelets x 3 months then plavix alone.  Patient counseled to be compliant with his antithrombotic medications  Needs outpt 30 day cardiac monitoring. We have notified cardiology. Requested placement in [place] with Dr. Telford Nab.  Ongoing aggressive stroke risk factor management  Therapy recommendations:  No therapy needs  Disposition:  Return home  Hx stroke/TIA  12/05/2015 L frontal subcortical stroke, speech abnormality (expressive aphasia)   07/2015 L internal capsule infarct s/p IV tPA secondary to small vessel disease, expressive aphasia  2003 symptoms x 2.5 hours, expressive aphasia, ? Stroke. family unsure.   Hypertension  Long term BP goal 120-140  Stable  now  Hyperlipidemia  Home meds:  crestor 10, had recommended decrease to 5 mg daily during last admission  Recommend resume crestor in hospital  LDL 32, at goal < 70  Continue statin at discharge  Diabetes type II  HgbA1c 6.4 in Dec, at goal < 7.0  Under control  Other Stroke Risk Factors  Advanced age  Former Cigarette smoker  Overweight, Body mass index is 25.72 kg/(m^2).   CAD/MI  Other Active Problems  R iliac artery aneurysm  Hospital day # [place] for Pager information 12/11/2015 10:22 AM   I, the attending vascular neurologist, have personally obtained a history, examined the patient, evaluated laboratory data, individually viewed imaging studies and agree with radiology interpretations. I obtained additional history from pt's wife and son at bedside. I also discussed with Dr. Posey Pronto regarding his care plan. Together with the NP/PA, we formulated the assessment and plan of care which reflects our mutual decision.  I have made any additions or clarifications directly to the above note and agree with the findings and plan as currently documented.   82 yo M with multiple stroke risk factors including HTN, HLD, DM, CAD, prior strokes admitted for transient expressive aphasia. MRA negative. With repeated expressive aphasia episodes and heart palpitation, needs to rule out afib and seizure. So far EEG negative and recommend 30 day monitoring. Recommend DAPT for 3 months and continue statin. OK to discharge from stroke standpoint. He has appointment with Dr. Leonie Man at [facility].  Neurology will sign off. Please call with questions. Pt will follow up with Dr. Leonie Man at [facility] as already scheduled. Thanks for the consult.   Rosalin Hawking, MD PhD Stroke Neurology 12/11/2015 4:49 PM       To contact Stroke Continuity provider, please refer to [email or web address] After hours, contact General Neurology

## 2015-12-12 ENCOUNTER — Telehealth: Payer: Self-pay | Admitting: Family Medicine

## 2015-12-12 ENCOUNTER — Encounter: Payer: Self-pay | Admitting: Neurology

## 2015-12-12 ENCOUNTER — Telehealth: Payer: Self-pay | Admitting: *Deleted

## 2015-12-12 NOTE — Telephone Encounter (Signed)
Patients wife advised to keep appointment tomorrow for a hospital follow up since they wanted him seen ASAP within the week.

## 2015-12-13 ENCOUNTER — Encounter: Payer: Self-pay | Admitting: Family Medicine

## 2015-12-13 ENCOUNTER — Encounter (INDEPENDENT_AMBULATORY_CARE_PROVIDER_SITE_OTHER): Payer: Self-pay

## 2015-12-13 ENCOUNTER — Ambulatory Visit (INDEPENDENT_AMBULATORY_CARE_PROVIDER_SITE_OTHER): Payer: Medicare Other | Admitting: Family Medicine

## 2015-12-13 VITALS — BP 137/79 | HR 61 | Temp 96.4°F | Ht 71.0 in | Wt 190.6 lb

## 2015-12-13 DIAGNOSIS — I10 Essential (primary) hypertension: Secondary | ICD-10-CM | POA: Diagnosis not present

## 2015-12-13 DIAGNOSIS — Z8673 Personal history of transient ischemic attack (TIA), and cerebral infarction without residual deficits: Secondary | ICD-10-CM

## 2015-12-13 DIAGNOSIS — E440 Moderate protein-calorie malnutrition: Secondary | ICD-10-CM

## 2015-12-13 MED ORDER — GLUCERNA SHAKE PO LIQD: 237.0000 mL | Freq: Two times a day (BID) | ORAL | Status: DC

## 2015-12-13 NOTE — Patient Instructions (Signed)
Great to see you guys!  Lets plan on seeing you again in 1 month unless you need anything sooner.

## 2015-12-13 NOTE — Progress Notes (Signed)
   HPI  Patient presents today here for hospital follow-up.  Patient was admitted for evaluation of stroke. Stroke was ruled out using MRI when he had a thorough evaluation. He's having a ultra monitor placed next week.  He states that since leaving the hospital he feels good. He has no complaints. He has no dysarthria, unilateral weakness or numbness, or other complaints.  No headaches, shortness of breath, chest pain.  He has some concerns about taking ensure, he thinks Glucerna would be a better idea given he has diabetes. They request changing this.  PMH: Smoking status noted ROS: Per HPI  Objective: BP 137/79 mmHg  Pulse 61  Temp(Src) 96.4 F (35.8 C) (Oral)  Ht 5\' 11"  (1.803 m)  Wt 190 lb 9.6 oz (86.456 kg)  BMI 26.60 kg/m2 Gen: NAD, alert, cooperative with exam HEENT: NCAT CV: RRR, good S1/S2, no murmur Resp: CTABL, no wheezes, non-labored Ext: No edema, warm Neuro: Alert and oriented strength 5/5 and sensation intact, cranial nerves II through XII intact  Assessment and plan:  # Malnutrition Changed to Glucerna from ensure Discussed the patient and his wife that this is probably not a huge change and that he would probably tolerate ensure just fine.  # HTN Well-controlled on benazepril, metoprolol, and HCTZ No changes  # TIA Now on ASA+ plavix No complaints, doing well No residual deficits    Meds ordered this encounter  Medications  . feeding supplement, GLUCERNA SHAKE, (GLUCERNA SHAKE) LIQD    Sig: Take 237 mLs by mouth 2 (two) times daily between meals.    Dispense:  60 Can    Refill:  Kenton Vale, MD [facility] 12/13/2015, 11:50 AM

## 2015-12-15 DIAGNOSIS — I639 Cerebral infarction, unspecified: Secondary | ICD-10-CM | POA: Diagnosis present

## 2015-12-15 NOTE — Discharge Summary (Signed)
[facility] Discharge Summary   Patient: [name] [id number]   PCP: Kenn File, MD DOB: 03-Jun-1930   Date of admission: 12/10/2015   Date of discharge: 12/11/2015     Discharge Diagnoses:  Principal Problem:   Ischemic cerebrovascular accident (CVA) of frontal lobe ([place]) Active Problems:   Hyperlipidemia LDL goal <70   Essential hypertension   CORONARY ATHEROSCLEROSIS NATIVE CORONARY ARTERY   PVD (peripheral vascular disease) (HCC)   CKD (chronic kidney disease) stage 3, GFR 30-59 ml/min   DM type 2 (diabetes mellitus, type 2) (HCC)   Malnutrition of moderate degree  Recommendations for Outpatient Follow-up:  1. Follow-up with PCP in one week 2. Follow-up with cardiology for event monitor next and follow-up with neurology in 2 months   Follow-up Information    Follow up with [facility] On 12/17/2015.   Specialty:  Cardiology   Why:  Appointment to pick up Cardiac Event Monitor on 12/17/2015 at 10:00AM.    Contact information:   [address], [place] [phone]      Follow up with Kenn File, MD. Schedule an appointment as soon as possible for a visit in 1 week.   Specialty:  Family Medicine   Contact information:   [place] [address] [phone]       Follow up with Antony Contras, MD. Go on 02/05/2016.   Specialties:  Neurology, Radiology   Contact information:   [address] [place] [address] [phone]      Diet recommendation: Cardiac and diabetic diet  Activity: The patient is advised to gradually reintroduce usual activities.  Discharge Condition: good  History of present illness: As per the H and P dictated on admission, "[name] is a 82 y.o. male with medical history significant for diabetes, chronic kidney disease, hypertension, dyslipidemia, CAD, peripheral vascular disease and recent admission for TIA symptoms with apparent stroke  on MRI with symptoms resolved by date of discharge on 5/5. During that admission, carotid Dopplers without evidence ICA stenosis, hemoglobin A1c was normal at 6.4, echo was unremarkable, PT/OT/speech therapy evaluated patient and no home needs documented. An MRI of the brain confirmed suspected punctate acute left frontal subcortical infarct and he had documented expressive aphasia related to acute left brain stroke which was transient and symptoms had resolved prior to discharge. He was to follow-up with neurology in 2 months. Patient and wife report patient has had 2 episodes of dysarthria since discharge from the hospital. The first episode only lasted a few minutes but today's episode lasted more than 20 minutes and patient appearred to have issues related to both receptive and expressive aphasia and complaints of tongue numbness. The wife had also noted that with each episode of dysarthria since discharge his blood pressure spiked up higher than baseline. They also noted that he had slight right facial drooping, all of which has resolved since arrival to the hospital. He currently is back to baseline according to patient and family.  ED Course:  Afebrile-BP 130/88-pulse 67-respirations 15-room air saturations 95%-weight 196 pounds CT head without contrast: Reveal a patchy periventricular small vessel disease without intracranial mass hemorrhage or acute-appearing infarct. Ativan 1 mg IV for MRI procedure"  Hospital Course:  Summary of his active problems in the hospital is as following.  Principal Problem:   Ischemic cerebrovascular accident (CVA) of frontal lobe [facility]) Patient presented with speech difficulty. MRI on 12/05/2015 showed left frontal punctate  subcortical infarct. MRI on 12/10/2015 no acute infarct.  Patient was seen by neurology. Due to recurrent symptoms patient was Recommended to be on dual antiplatelet for 3 months and then on Plavix. Follow-up with neurology on July 5. EEG  was unremarkable for seizure. Patient recommended to be on 30 day monitor as an outpatient. Patient recently had complete stroke workup.  Active Problems:   Hyperlipidemia LDL goal <70 Continue Crestor.    Essential hypertension Continue benazepril and metoprolol.    CORONARY ATHEROSCLEROSIS NATIVE CORONARY ARTERY Dual antiplatelet at present.    CKD (chronic kidney disease) stage 3, GFR 30-59 ml/min Renal function remained stable.    DM type 2 (diabetes mellitus, type 2) (HCC) Continue metformin.    Malnutrition of moderate degree Continue nutritional supplements on discharge   All other chronic medical condition were stable during the hospitalization.  Patient was ambulatory without any assistance. On the day of the discharge the patient's vitals are stable and symptoms resolved, and no other acute medical condition were reported by patient. the patient was felt safe to be discharge at home with family.  Procedures and Results:  EEG  Clinical Interpretation: This normal EEG is recorded in the waking and sleep state. There was no seizure or seizure predisposition recorded on this study. Please note that a normal EEG does not preclude the possibility of epilepsy.   Consultations:  Neurology  DISCHARGE MEDICATION: Discharge Medication List as of 12/11/2015  4:34 PM    START taking these medications   Details  feeding supplement, ENSURE ENLIVE, (ENSURE ENLIVE) LIQD Take 237 mLs by mouth 2 (two) times daily between meals., Starting 12/11/2015, Until Discontinued, Normal      CONTINUE these medications which have CHANGED   Details  aspirin 81 MG tablet Take 1 tablet (81 mg total) by mouth daily., Starting 12/11/2015, Until Mon 02/10/16, Normal      CONTINUE these medications which have NOT CHANGED   Details  albuterol (PROAIR HFA) 108 (90 BASE) MCG/ACT inhaler INHALE TWO PUFFS INTO LUNGS 4 TIMES DAILY AS NEEDED, Normal    benazepril (LOTENSIN) 10 MG tablet Take 10 mg by  mouth daily., Starting 11/18/2015, Until Discontinued, Historical Med    Cholecalciferol (VITAMIN D3) 50000 UNITS CAPS Take 1 capsule by mouth once a week., Starting 07/28/2015, Until Discontinued, Normal    clopidogrel (PLAVIX) 75 MG tablet Take 1 tablet (75 mg total) by mouth daily., Starting 10/14/2015, Until Discontinued, Normal    fish oil-omega-3 fatty acids 1000 MG capsule Take 2 g by mouth daily.  , Until Discontinued, Historical Med    Fluticasone-Salmeterol (ADVAIR DISKUS) 100-50 MCG/DOSE AEPB INHALE ONE DOSE BY MOUTH EVERY 12 HOURS AS NEEDED, Normal    hydrochlorothiazide (HYDRODIURIL) 25 MG tablet TAKE ONE-HALF TABLET BY MOUTH ONCE DAILY EXCEPT  ON  MONDAY,  WEDNESDAY  AND  FRIDAY  TAKE  A  WHOLE  TABLET, Normal    metFORMIN (GLUCOPHAGE-XR) 500 MG 24 hr tablet Take 1 tablet (500 mg total) by mouth 2 (two) times daily., Starting 02/08/2015, Until Discontinued, Normal    metoprolol (LOPRESSOR) 100 MG tablet Take 1 tablet (100 mg total) by mouth 2 (two) times daily., Starting 02/08/2015, Until Discontinued, Normal    nitroGLYCERIN (NITROSTAT) 0.4 MG SL tablet Place 0.4 mg under the tongue every 5 (five) minutes as needed for chest pain., Until Discontinued, Historical Med    rosuvastatin (CRESTOR) 10 MG tablet Take 1 tablet (10 mg total) by mouth daily., Starting 02/08/2015, Until Discontinued, Normal  Allergies  Allergen Reactions  . Codeine Other (See Comments)    Bad dreams  . Lopid [Gemfibrozil] Nausea Only  . Voltaren [Diclofenac Sodium] Diarrhea   Discharge Instructions    Diet - low sodium heart healthy    Complete by:  As directed      Diet Carb Modified    Complete by:  As directed      Increase activity slowly    Complete by:  As directed           Discharge Exam: Filed Weights   12/10/15 1216 12/10/15 1842  Weight: 88.905 kg (196 lb) 83.598 kg (184 lb 4.8 oz)   Filed Vitals:   12/11/15 0616 12/11/15 0904  BP: 124/79 136/72  Pulse: 75 81  Temp: 97.9 F  (36.6 C) 98 F (36.7 C)  Resp: 16 20   General: Appear in no distress, no Rash; Oral Mucosa moist. Cardiovascular: S1 and S2 Present, no Murmur, no JVD Respiratory: Bilateral Air entry present and Clear to Auscultation, no Crackles, no wheezes Abdomen: Bowel Sound present, Soft and no tenderness Extremities: no Pedal edema, no calf tenderness Neurology: Grossly no focal neuro deficit.  The results of significant diagnostics from this hospitalization (including imaging, microbiology, ancillary and laboratory) are listed below for reference.    Significant Diagnostic Studies: Dg Chest 2 View  12/05/2015  CLINICAL DATA:  TIA tonight.  Hypertension.  Previous smoker. EXAM: CHEST  2 VIEW COMPARISON:  06/02/2012 FINDINGS: Mild hyperinflation suggesting emphysema. Central interstitial pattern to the lungs indicating chronic bronchitis. Linear fibrosis in the lung bases. No focal airspace disease or consolidation. No blunting of costophrenic angles. No pneumothorax. Mediastinal contours appear intact. Calcified and tortuous aorta. Normal heart size and pulmonary vascularity. Degenerative changes in the spine. IMPRESSION: Mild emphysematous and chronic bronchitic changes in the lungs. No evidence of active pulmonary disease. Electronically Signed   By: Lucienne Capers M.D.   On: 12/05/2015 23:57   Ct Head Wo Contrast  12/10/2015  CLINICAL DATA:  Transient slurred speech earlier today EXAM: CT HEAD WITHOUT CONTRAST TECHNIQUE: Contiguous axial images were obtained from the base of the skull through the vertex without intravenous contrast. COMPARISON:  Head CT Dec 05, 2015 and brain MRI Dec 06, 2015 FINDINGS: Mild diffuse atrophy is stable. There is no intracranial mass, hemorrhage, extra-axial fluid collection, or midline shift. There is patchy small vessel disease in the centra semiovale bilaterally. Elsewhere gray-white compartments appear normal. No acute infarct is appreciable by CT. The bony calvarium  appears intact. The mastoid air cells are clear. Orbits appear symmetric bilaterally. Mild debris is noted in each external auditory canal. IMPRESSION: Atrophy with patchy periventricular small vessel disease. No intracranial mass, hemorrhage, or acute appearing infarct evident. Mild apparent cerumen in each external auditory canal. Electronically Signed   By: Lowella Grip III M.D.   On: 12/10/2015 13:08   Ct Head Wo Contrast  12/05/2015  CLINICAL DATA:  Code stroke. EXAM: CT HEAD WITHOUT CONTRAST TECHNIQUE: Contiguous axial images were obtained from the base of the skull through the vertex without intravenous contrast. COMPARISON:  07/16/2015; brain MRI -07/16/2015 FINDINGS: Scattered periventricular hypodensities compatible with microvascular ischemic disease. The gray-white differentiation is otherwise well maintained without CT evidence of acute large territory infarct. No intraparenchymal or extra-axial mass or hemorrhage. Unchanged size and configuration of the ventricles and basilar cisterns. No midline shift. Intracranial atherosclerosis. Limited visualization of the paranasal sinuses and mastoid air cells is normal. No air-fluid levels. Regional soft tissues appear  normal. No displaced calvarial fracture. Post bilateral cataract surgery. IMPRESSION: Similar findings of mild ischemic disease without acute intracranial process. Critical Value/emergent results were called by telephone at the time of interpretation on 12/05/2015 at 9:43 pm to Dr. Nicole Kindred, who verbally acknowledged these results. Electronically Signed   By: Sandi Mariscal M.D.   On: 12/05/2015 21:47   Mr Jodene Nam Head Wo Contrast  12/10/2015  CLINICAL DATA:  TIA.  Slurred speech EXAM: MR HEAD WITHOUT CONTRAST MR CIRCLE OF WILLIS WITHOUT CONTRAST MRA OF THE NECK WITHOUT AND WITH CONTRAST TECHNIQUE: Multiplanar, multiecho pulse sequences of the brain and surrounding structures were obtained according to standard protocol without intravenous  contrast.; Angiographic images of the Circle of Willis were obtained using MRA technique without intravenous contrast.; Multiplanar and multiecho pulse sequences of the neck were obtained without and with intravenous contrast. Angiographic images of the neck were obtained using MRA technique without and with intravenous contast. CONTRAST:  29mL MULTIHANCE GADOBENATE DIMEGLUMINE 529 MG/ML IV SOLN COMPARISON:  CT 12/10/2015.  MRI 12/06/2015 FINDINGS: MR HEAD FINDINGS Negative for acute infarct. Punctate focus of diffusion hyperintensity left frontal white matter no longer visualized. This may have been an acute infarct or artifact on the prior study. Moderate atrophy.  Negative for hydrocephalus Moderate chronic microvascular ischemic changes throughout the white matter and pons. Negative for intracranial hemorrhage.  No mass or edema. Normal orbit.  Normal pituitary. MR CIRCLE OF WILLIS FINDINGS Both vertebral arteries patent to the basilar. Mild stenosis distal right vertebral artery. Left PICA patent. Right PICA is small and patent. Basilar patent. Fetal origin right posterior cerebral artery. Signal loss left posterior cerebral artery due to tortuosity. Right posterior cerebral artery widely patent. Cavernous carotid is patent bilaterally with mild atherosclerotic disease and mild stenosis on the right. Mild atherosclerotic irregularity in the M1 segment bilaterally without significant stenosis. Middle cerebral artery branches widely patent bilaterally. Anterior cerebral arteries patent bilaterally without significant stenosis. Negative for cerebral aneurysm. MRA NECK FINDINGS Mild atherosclerotic plaque involving the proximal right internal carotid artery. There is possible ulceration of the plaque without significant stenosis Left carotid bifurcation widely patent Both vertebral arteries widely patent without stenosis or dissection IMPRESSION: Negative for acute infarct. Possible small acute infarct left frontal  white matter seen on 12/06/2015 no longer visualized and may have been a small infarct or artifact Atrophy and chronic microvascular ischemia No significant intracranial stenosis No significant carotid or vertebral artery stenosis in the neck. Possible plaque ulceration proximal right internal carotid artery. Electronically Signed   By: Franchot Gallo M.D.   On: 12/10/2015 18:53   Mr Jodene Nam Head Wo Contrast  12/06/2015  CLINICAL DATA:  Acute onset speech difficulty. History of hypertension, dyslipidemia. EXAM: MRA HEAD WITHOUT CONTRAST TECHNIQUE: Angiographic images of the Circle of Willis were obtained using MRA technique without intravenous contrast. COMPARISON:  CT HEAD Dec 05, 2015 at 2141 hours FINDINGS: Mild motion degraded examination. Anterior circulation: Normal flow related enhancement of the included cervical, petrous, cavernous and supraclinoid internal carotid arteries. 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 Patent anterior communicating artery. Normal flow related enhancement of the anterior and middle cerebral arteries, including distal segments. Supernumerary anterior cerebral artery arising from LEFT A1-2 junction. No large vessel occlusion, high-grade stenosis, abnormal luminal irregularity. Posterior circulation: Codominant vertebral arteries. Basilar artery is patent, with normal flow related enhancement of the main branch vessels. Normal flow related enhancement of the posterior cerebral arteries. Fetal origin RIGHT posterior cerebral artery. LEFT posterior communicating  artery is patent. Loss of flow related enhancement LEFT posterior cerebral artery P2 segment on maximum intensity projected reformations though the vessel is patent on raw data. No large vessel occlusion, high-grade stenosis, abnormal luminal irregularity, aneurysm. IMPRESSION: No emergent large vessel occlusion or high-grade stenosis on this mildly motion degraded examination. 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 Electronically Signed   By: Elon Alas M.D.   On: 12/06/2015 00:19   Mr Angiogram Neck W Wo Contrast  12/10/2015  CLINICAL DATA:  TIA.  Slurred speech EXAM: MR HEAD WITHOUT CONTRAST MR CIRCLE OF WILLIS WITHOUT CONTRAST MRA OF THE NECK WITHOUT AND WITH CONTRAST TECHNIQUE: Multiplanar, multiecho pulse sequences of the brain and surrounding structures were obtained according to standard protocol without intravenous contrast.; Angiographic images of the Circle of Willis were obtained using MRA technique without intravenous contrast.; Multiplanar and multiecho pulse sequences of the neck were obtained without and with intravenous contrast. Angiographic images of the neck were obtained using MRA technique without and with intravenous contast. CONTRAST:  53mL MULTIHANCE GADOBENATE DIMEGLUMINE 529 MG/ML IV SOLN COMPARISON:  CT 12/10/2015.  MRI 12/06/2015 FINDINGS: MR HEAD FINDINGS Negative for acute infarct. Punctate focus of diffusion hyperintensity left frontal white matter no longer visualized. This may have been an acute infarct or artifact on the prior study. Moderate atrophy.  Negative for hydrocephalus Moderate chronic microvascular ischemic changes throughout the white matter and pons. Negative for intracranial hemorrhage.  No mass or edema. Normal orbit.  Normal pituitary. MR CIRCLE OF WILLIS FINDINGS Both vertebral arteries patent to the basilar. Mild stenosis distal right vertebral artery. Left PICA patent. Right PICA is small and patent. Basilar patent. Fetal origin right posterior cerebral artery. Signal loss left posterior cerebral artery due to tortuosity. Right posterior cerebral artery widely patent. Cavernous carotid is patent bilaterally with mild atherosclerotic disease and mild stenosis on the right. Mild atherosclerotic irregularity in the M1 segment bilaterally without significant stenosis. Middle cerebral artery branches widely patent bilaterally. Anterior cerebral  arteries patent bilaterally without significant stenosis. Negative for cerebral aneurysm. MRA NECK FINDINGS Mild atherosclerotic plaque involving the proximal right internal carotid artery. There is possible ulceration of the plaque without significant stenosis Left carotid bifurcation widely patent Both vertebral arteries widely patent without stenosis or dissection IMPRESSION: Negative for acute infarct. Possible small acute infarct left frontal white matter seen on 12/06/2015 no longer visualized and may have been a small infarct or artifact Atrophy and chronic microvascular ischemia No significant intracranial stenosis No significant carotid or vertebral artery stenosis in the neck. Possible plaque ulceration proximal right internal carotid artery. Electronically Signed   By: Franchot Gallo M.D.   On: 12/10/2015 18:53   Mr Brain Wo Contrast  12/10/2015  CLINICAL DATA:  TIA.  Slurred speech EXAM: MR HEAD WITHOUT CONTRAST MR CIRCLE OF WILLIS WITHOUT CONTRAST MRA OF THE NECK WITHOUT AND WITH CONTRAST TECHNIQUE: Multiplanar, multiecho pulse sequences of the brain and surrounding structures were obtained according to standard protocol without intravenous contrast.; Angiographic images of the Circle of Willis were obtained using MRA technique without intravenous contrast.; Multiplanar and multiecho pulse sequences of the neck were obtained without and with intravenous contrast. Angiographic images of the neck were obtained using MRA technique without and with intravenous contast. CONTRAST:  21mL MULTIHANCE GADOBENATE DIMEGLUMINE 529 MG/ML IV SOLN COMPARISON:  CT 12/10/2015.  MRI 12/06/2015 FINDINGS: MR HEAD FINDINGS Negative for acute infarct. Punctate focus of diffusion hyperintensity left frontal white matter no longer visualized. This may have been  an acute infarct or artifact on the prior study. Moderate atrophy.  Negative for hydrocephalus Moderate chronic microvascular ischemic changes throughout the white  matter and pons. Negative for intracranial hemorrhage.  No mass or edema. Normal orbit.  Normal pituitary. MR CIRCLE OF WILLIS FINDINGS Both vertebral arteries patent to the basilar. Mild stenosis distal right vertebral artery. Left PICA patent. Right PICA is small and patent. Basilar patent. Fetal origin right posterior cerebral artery. Signal loss left posterior cerebral artery due to tortuosity. Right posterior cerebral artery widely patent. Cavernous carotid is patent bilaterally with mild atherosclerotic disease and mild stenosis on the right. Mild atherosclerotic irregularity in the M1 segment bilaterally without significant stenosis. Middle cerebral artery branches widely patent bilaterally. Anterior cerebral arteries patent bilaterally without significant stenosis. Negative for cerebral aneurysm. MRA NECK FINDINGS Mild atherosclerotic plaque involving the proximal right internal carotid artery. There is possible ulceration of the plaque without significant stenosis Left carotid bifurcation widely patent Both vertebral arteries widely patent without stenosis or dissection IMPRESSION: Negative for acute infarct. Possible small acute infarct left frontal white matter seen on 12/06/2015 no longer visualized and may have been a small infarct or artifact Atrophy and chronic microvascular ischemia No significant intracranial stenosis No significant carotid or vertebral artery stenosis in the neck. Possible plaque ulceration proximal right internal carotid artery. Electronically Signed   By: Franchot Gallo M.D.   On: 12/10/2015 18:53   Mr Brain Wo Contrast  12/06/2015  CLINICAL DATA:  Sudden onset expressive aphasia yesterday. Symptoms since resolved. EXAM: MRI HEAD WITHOUT CONTRAST TECHNIQUE: Multiplanar, multiecho pulse sequences of the brain and surrounding structures were obtained without intravenous contrast. COMPARISON:  Head CT 12/05/2015 and MRI 07/16/2015 FINDINGS: There is a 2 mm focus of hyperintense  trace diffusion signal in the posterior left frontal lobe subcortical white matter on the coronal series (series 6, image 16). Very faint signal abnormality is present in this region on the axial diffusion series. It is difficult to confirm reduced ADC due to its small size. There is no other evidence of acute infarct. There is moderate cerebral atrophy. Patchy T2 hyperintensities throughout the cerebral white matter and in the pons are unchanged and compatible with moderate chronic small vessel ischemic disease. Small, chronic infarcts are again seen in both cerebellar hemispheres and in the left periatrial white matter. Prior bilateral cataract extraction is noted. Paranasal sinuses and mastoid air cells are clear. Major intracranial arterial vascular flow voids are preserved. Abnormal appearance of the left sigmoid sinus and left jugular bulb is unchanged and may reflect slow flow. IMPRESSION: 1. Suspected punctate acute left frontal subcortical infarct. 2. Moderate chronic small vessel ischemic disease and cerebral atrophy. Electronically Signed   By: Logan Bores M.D.   On: 12/06/2015 12:22    Microbiology: No results found for this or any previous visit (from the past 240 hour(s)).   Labs: CBC:  Recent Labs Lab 12/10/15 1220 12/10/15 1245  WBC 6.9  --   NEUTROABS 4.0  --   HGB 15.8 17.0  HCT 44.9 50.0  MCV 93.7  --   PLT 193  --    Basic Metabolic Panel:  Recent Labs Lab 12/10/15 1220 12/10/15 1245  NA 136 135  K 3.7 3.7  CL 99* 96*  CO2 25  --   GLUCOSE 149* 142*  BUN 16 18  CREATININE 1.22 1.20  CALCIUM 10.0  --    Liver Function Tests:  Recent Labs Lab 12/10/15 1220  AST 26  ALT  24  ALKPHOS 60  BILITOT 1.1  PROT 7.0  ALBUMIN 4.1   CBG:  Recent Labs Lab 12/10/15 2220 12/11/15 0638 12/11/15 1058 12/11/15 1619  GLUCAP 139* 127* 166* 125*   Time spent: 30 minutes  Signed:  Jamontae Thwaites  [facility] 12/11/2015 , 7:55 AM

## 2015-12-17 ENCOUNTER — Other Ambulatory Visit: Payer: Self-pay | Admitting: Student

## 2015-12-17 ENCOUNTER — Ambulatory Visit (INDEPENDENT_AMBULATORY_CARE_PROVIDER_SITE_OTHER): Payer: Medicare Other

## 2015-12-17 DIAGNOSIS — I639 Cerebral infarction, unspecified: Secondary | ICD-10-CM | POA: Diagnosis not present

## 2015-12-17 DIAGNOSIS — I4891 Unspecified atrial fibrillation: Secondary | ICD-10-CM

## 2015-12-18 ENCOUNTER — Other Ambulatory Visit: Payer: Self-pay

## 2015-12-18 MED ORDER — GLUCOSE BLOOD VI STRP: ORAL_STRIP | Status: DC

## 2016-01-07 ENCOUNTER — Other Ambulatory Visit: Payer: Self-pay | Admitting: Family Medicine

## 2016-01-09 DIAGNOSIS — C44329 Squamous cell carcinoma of skin of other parts of face: Secondary | ICD-10-CM | POA: Diagnosis not present

## 2016-01-09 DIAGNOSIS — C44621 Squamous cell carcinoma of skin of unspecified upper limb, including shoulder: Secondary | ICD-10-CM | POA: Diagnosis not present

## 2016-01-14 NOTE — Telephone Encounter (Signed)
Erroneous Encounter

## 2016-02-05 ENCOUNTER — Encounter: Payer: Self-pay | Admitting: Neurology

## 2016-02-05 ENCOUNTER — Ambulatory Visit (INDEPENDENT_AMBULATORY_CARE_PROVIDER_SITE_OTHER): Payer: Medicare Other | Admitting: Neurology

## 2016-02-05 VITALS — BP 128/79 | HR 67 | Ht 71.0 in | Wt 190.6 lb

## 2016-02-05 DIAGNOSIS — I639 Cerebral infarction, unspecified: Secondary | ICD-10-CM

## 2016-02-05 DIAGNOSIS — I6381 Other cerebral infarction due to occlusion or stenosis of small artery: Secondary | ICD-10-CM

## 2016-02-05 NOTE — Patient Instructions (Signed)
I had a long d/w patient and his wife about his recent stroke, risk for recurrent stroke/TIAs, personally independently reviewed imaging studies and stroke evaluation results and answered questions.Continue aspirin 81 mg daily and clopidogrel 75 mg daily  form 1 month and then discontinue aspirin and stay on Plavix alone for secondary stroke prevention and maintain strict control of hypertension with blood pressure goal below 130/90, diabetes with hemoglobin A1c goal below 6.5% and lipids with LDL cholesterol goal below 70 mg/dL. I also advised the patient to eat a healthy diet with plenty of whole grains, cereals, fruits and vegetables, exercise regularly and maintain ideal body weight .the patient is being given a letter addressed to insurance company claims upon his request regarding his admission for stroke in December 2016 Followup in the future with my nurse practitioner in 6 months or call earlier if necessary  Stroke Prevention Some medical conditions and behaviors are associated with an increased chance of having a stroke. You may prevent a stroke by making healthy choices and managing medical conditions. HOW CAN I REDUCE MY RISK OF HAVING A STROKE?   Stay physically active. Get at least 30 minutes of activity on most or all days.  Do not smoke. It may also be helpful to avoid exposure to secondhand smoke.  Limit alcohol use. Moderate alcohol use is considered to be:  No more than 2 drinks per day for men.  No more than 1 drink per day for nonpregnant women.  Eat healthy foods. This involves:  Eating 5 or more servings of fruits and vegetables a day.  Making dietary changes that address high blood pressure (hypertension), high cholesterol, diabetes, or obesity.  Manage your cholesterol levels.  Making food choices that are high in fiber and low in saturated fat, trans fat, and cholesterol may control cholesterol levels.  Take any prescribed medicines to control cholesterol as  directed by your health care provider.  Manage your diabetes.  Controlling your carbohydrate and sugar intake is recommended to manage diabetes.  Take any prescribed medicines to control diabetes as directed by your health care provider.  Control your hypertension.  Making food choices that are low in salt (sodium), saturated fat, trans fat, and cholesterol is recommended to manage hypertension.  Ask your health care provider if you need treatment to lower your blood pressure. Take any prescribed medicines to control hypertension as directed by your health care provider.  If you are 77-2 years of age, have your blood pressure checked every 3-5 years. If you are 103 years of age or older, have your blood pressure checked every year.  Maintain a healthy weight.  Reducing calorie intake and making food choices that are low in sodium, saturated fat, trans fat, and cholesterol are recommended to manage weight.  Stop drug abuse.  Avoid taking birth control pills.  Talk to your health care provider about the risks of taking birth control pills if you are over 39 years old, smoke, get migraines, or have ever had a blood clot.  Get evaluated for sleep disorders (sleep apnea).  Talk to your health care provider about getting a sleep evaluation if you snore a lot or have excessive sleepiness.  Take medicines only as directed by your health care provider.  For some people, aspirin or blood thinners (anticoagulants) are helpful in reducing the risk of forming abnormal blood clots that can lead to stroke. If you have the irregular heart rhythm of atrial fibrillation, you should be on a blood thinner unless  there is a good reason you cannot take them.  Understand all your medicine instructions.  Make sure that other conditions (such as anemia or atherosclerosis) are addressed. SEEK IMMEDIATE MEDICAL CARE IF:   You have sudden weakness or numbness of the face, arm, or leg, especially on one  side of the body.  Your face or eyelid droops to one side.  You have sudden confusion.  You have trouble speaking (aphasia) or understanding.  You have sudden trouble seeing in one or both eyes.  You have sudden trouble walking.  You have dizziness.  You have a loss of balance or coordination.  You have a sudden, severe headache with no known cause.  You have new chest pain or an irregular heartbeat. Any of these symptoms may represent a serious problem that is an emergency. Do not wait to see if the symptoms will go away. Get medical help at once. Call your local emergency services (911 in [place] Do not drive yourself to the hospital.   This information is not intended to replace advice given to you by your health care provider. Make sure you discuss any questions you have with your health care provider.   Document Released: 08/27/2004 Document Revised: 08/10/2014 Document Reviewed: 01/20/2013 Elsevier Interactive Patient Education [employer].

## 2016-02-05 NOTE — Progress Notes (Signed)
[facility] [address] [place]. [place] [address] [phone]       OFFICE FOLLOW-UP NOTE  [name] Date of Birth:  07/05/1930 Medical Record Number:  [id number]   HPI: 10 year Caucasian male seen today for first office follow-up visit following hospital admission for stroke in December 2016. [name] is an 82 y.o. male with a past medical history significant for HTN, DM, CAD, MI, TIA, iliac artery aneurysm, brought in by EMS as a code stroke due to acute onset onset of right hand weakness, right lower face weakness, difficulty talking. Wife is at the bedside and said that they were eating lunch when he began to have trouble using his hands and dropped a cup from his right hand. Then, he started having difficulty expressing himself and she noticed some right face droopiness and thus EMS was summoned. (LKW 07/14/2105 at 1230p). Initial NIHSS 2, with fluctuating non fluent spontaneous language. CT brain was personally reviewed and showed no acute abnormality. Denies HA, vertigo, double vision, focal weakness or numbness, confusion, or vision impairment. Patient takes aggrenox daily. NIHSS 2. Modified Rankin score: 0. Patient was administered TPA 07/14/2105 at 1524. He was admitted to the stroke floor for further evaluation and treatment. CT scan of the head on admission showed no acute abnormality. He was administered IV tPA uneventfully without any complications and showed significant clinical improvement. MRI scan of the brain showed small left internal capsule infarct. MRA of the brain showed moderate changes of indeterminate atherosclerosis but the left middle cerebral artery was wide open. Transthoracic echo showed normal ejection fraction without cardiac source of embolism. EEG showed no seizure activity. LDL cholesterol was marginally elevated at 108. Hemoglobin A1c in October 2016 was 6.2. Patient did well and was seen by physical occupational therapy and had  no therapy needs and was returned home and is tolerating Plavix well without bleeding bruising or other side effects. He states his blood pressure is doing good and today it was 124/78 in office. His fasting sugars are slightly high in the 130s range. He is eating healthy and does walk a lot. He has gradually lost 20-30 pounds over the last 2 years. He had lab work done by his primary physician last week which was apparently fine but I do not have those results. He has no complaints today. Update 02/05/2016 : He returns for follow-up after last visit 4 months ago. Patient was readmitted on 5th  May 2017 with recurrent dysarthria and MRI scan of the brain showed a new small left frontal punctate lacunar infarct. He had been on Aggrenox which was switched to Plavix. We did not repeat stroke workup since he had previously had extensive workup in December 2016. Patient is discharged home but came back a few days later with transient worsening of his speech. A repeat MRI scan did not show another new infarct this time but Dr. Erlinda Hong saw the patient and added aspirin 81 mg to his Plavix. For 3 months. Patient states his done well since his second discharge. His abdomen or recurrent speech difficulties or any other new strokelike symptoms. He is tolerating aspirin and Plavix with only minor bruising but no bleeding episodes. His blood pressure is well controlled and today it is 128/79. His past sugars have all been in the 120s range. He does not have any new complaints today. ROS:   14 system review of systems is positive for appetite change, frequent waking, daytime sleepiness and all other  systems negative and all other systems negative  PMH:  Past Medical History  Diagnosis Date  . DYSLIPIDEMIA   . HYPERTENSION   . CORONARY ATHEROSCLEROSIS NATIVE CORONARY ARTERY     Myocardial infarction at [facility] in 1996 with a directional atherectomy of the 90% LAD stenosis and medical management of 60-70% proximal  circumflex and 50% RCA stenosis.  . Cerebral atherosclerosis   . UPPER RESPIRATORY INFECTION   . ILIAC ARTERY ANEURYSM   . Asthma   . TIA (transient ischemic attack)   . Myocardial infarction ([place]) 1996  . Type II diabetes mellitus ([place])   . History of hiatal hernia   . GERD (gastroesophageal reflux disease)   . Stroke [facility]) 2003; 2013; 07/2015; 12/05/2015    denies residual on 12/10/2015  . Arthritis     "wrists, knees, back" (12/10/2015)  . Chronic back pain     "lower primarily" (12/10/2015)    Social History:  Social History   Social History  . Marital Status: Married    Spouse Name: N/A  . Number of Children: N/A  . Years of Education: N/A   Occupational History  . 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 Smoking status: Former Smoker -- 0.12 packs/day for 45 years    Types: Cigarettes    Quit date: 11/24/1984  . Smokeless tobacco: Former Systems developer    Types: Snuff     Comment: "dipped snuff when I was a boy"  . Alcohol Use: No  . Drug Use: No  . Sexual Activity: No   Other Topics Concern  . Not on file   Social History Narrative    Medications:   Current Outpatient Prescriptions on File Prior to Visit  Medication Sig Dispense Refill  . albuterol (PROAIR HFA) 108 (90 BASE) MCG/ACT inhaler INHALE TWO PUFFS INTO LUNGS 4 TIMES DAILY AS NEEDED 54 each 3  . aspirin 81 MG tablet Take 1 tablet (81 mg total) by mouth daily. 180 tablet 0  . benazepril (LOTENSIN) 10 MG tablet Take 10 mg by mouth daily.    . Cholecalciferol (VITAMIN D3) 50000 UNITS CAPS Take 1 capsule by mouth once a week. (Patient taking differently: Take 1 capsule by mouth once a week. On Friday) 12 capsule 3  . clopidogrel (PLAVIX) 75 MG tablet TAKE ONE TABLET BY MOUTH DAILY 30 tablet 1  . feeding supplement, GLUCERNA SHAKE, (GLUCERNA SHAKE) LIQD Take 237 mLs by mouth 2 (two) times daily between meals. 60 Can 11  . fish oil-omega-3 fatty acids 1000 MG capsule Take 2 g by mouth daily.      .  Fluticasone-Salmeterol (ADVAIR DISKUS) 100-50 MCG/DOSE AEPB INHALE ONE DOSE BY MOUTH EVERY 12 HOURS AS NEEDED (Patient taking differently: Inhale 1 puff into the lungs every 12 (twelve) hours as needed (wheezing). ) 180 each 3  . glucose blood test strip Check blood sugar twice daily and as needed. Dx Code 100 each 12  . hydrochlorothiazide (HYDRODIURIL) 25 MG tablet TAKE ONE-HALF TABLET BY MOUTH ONCE DAILY EXCEPT  ON  MONDAY,  WEDNESDAY  AND  FRIDAY  TAKE  A  WHOLE  TABLET 90 tablet 3  . metFORMIN (GLUCOPHAGE-XR) 500 MG 24 hr tablet Take 1 tablet (500 mg total) by mouth 2 (two) times daily. 180 tablet 3  . metoprolol (LOPRESSOR) 100 MG tablet Take 1 tablet (100 mg total) by mouth 2 (two) times daily. 180 tablet 3  . nitroGLYCERIN (NITROSTAT) 0.4 MG SL  tablet Place 0.4 mg under the tongue every 5 (five) minutes as needed for chest pain.    . rosuvastatin (CRESTOR) 10 MG tablet Take 1 tablet (10 mg total) by mouth daily. 90 tablet 3   No current facility-administered medications on file prior to visit.    Allergies:   Allergies  Allergen Reactions  . Codeine Other (See Comments)    Bad dreams  . Lopid [Gemfibrozil] Nausea Only  . Voltaren [Diclofenac Sodium] Diarrhea    Physical Exam General: well developed, well nourished elderly Caucasian male, seated, in no evident distress Head: head normocephalic and atraumatic.  Neck: supple with no carotid or supraclavicular bruits Cardiovascular: regular rate and rhythm, no murmurs Musculoskeletal: no deformity Skin:  no rash/petichiae Vascular:  Normal pulses all extremities Filed Vitals:   02/05/16 1334  BP: 128/79  Pulse: 67   Neurologic Exam Mental Status: Awake and fully alert. Oriented to place and time. Recent and remote memory intact. Attention span, concentration and fund of knowledge appropriate. Mood and affect appropriate.  Cranial Nerves: Fundoscopic exam reveals sharp disc margins. Pupils equal, briskly reactive to light.  Extraocular movements full without nystagmus. Visual fields full to confrontation. Hearing intact. Facial sensation intact. Face, tongue, palate moves normally and symmetrically.  Motor: Normal bulk and tone. Normal strength in all tested extremity muscles.Diminished fine finger movements on the right. Orbits left over right upper extremity. Sensory.: intact to touch ,pinprick .position and vibratory sensation.  Coordination: Rapid alternating movements normal in all extremities. Finger-to-nose and heel-to-shin performed accurately bilaterally. Gait and Station: Arises from chair without difficulty. Stance is normal. Gait demonstrates normal stride length and balance . Able to heel, toe and tandem walk without difficulty.  Reflexes: 1+ and symmetric. Toes downgoing.   NIHSS  0 Modified Rankin  1   ASSESSMENT: 33 year patient with small left internal capsule infarct secondary to small vessel disease in December 2017 treated with IV TPA with full functional recovery. Vascular risk factors of diabetes, hyperlipidemia, age and sex.Recurrent lacunar infarct in May 2017 with repeat admission few days later with transient worsening of symptoms    PLAN: I had a long d/w patient and his wife about his recent stroke, risk for recurrent stroke/TIAs, personally independently reviewed imaging studies and stroke evaluation results and answered questions.Continue aspirin 81 mg daily and clopidogrel 75 mg daily  form 1 month and then discontinue aspirin and stay on Plavix alone for secondary stroke prevention and maintain strict control of hypertension with blood pressure goal below 130/90, diabetes with hemoglobin A1c goal below 6.5% and lipids with LDL cholesterol goal below 70 mg/dL. I also advised the patient to eat a healthy diet with plenty of whole grains, cereals, fruits and vegetables, exercise regularly and maintain ideal body weight .the patient is being given a letter addressed to insurance company  claims upon his request regarding his admission for stroke in December 2016 Followup in the future with my nurse practitioner in 6 months or call earlier if necessary. Greater than 50% time during this 25 minute visit was spent on counseling and coordination of care about stroke risk, prevention and treatment  Antony Contras, MD Note: This document was prepared with digital dictation and possible smart phrase technology. Any transcriptional errors that result from this process are unintentional

## 2016-02-07 NOTE — Progress Notes (Signed)
Letter fax to [place] claims department at [phone] for pt diagnose while he was in hospitalized at [facility]. Letter done and sign per Dr.Sethi.

## 2016-02-18 DIAGNOSIS — H918X1 Other specified hearing loss, right ear: Secondary | ICD-10-CM | POA: Diagnosis not present

## 2016-02-18 DIAGNOSIS — J343 Hypertrophy of nasal turbinates: Secondary | ICD-10-CM | POA: Diagnosis not present

## 2016-02-18 DIAGNOSIS — J329 Chronic sinusitis, unspecified: Secondary | ICD-10-CM | POA: Diagnosis not present

## 2016-02-18 DIAGNOSIS — Z8673 Personal history of transient ischemic attack (TIA), and cerebral infarction without residual deficits: Secondary | ICD-10-CM | POA: Diagnosis not present

## 2016-02-18 DIAGNOSIS — Z885 Allergy status to narcotic agent status: Secondary | ICD-10-CM | POA: Diagnosis not present

## 2016-02-18 DIAGNOSIS — Z7951 Long term (current) use of inhaled steroids: Secondary | ICD-10-CM | POA: Diagnosis not present

## 2016-02-18 DIAGNOSIS — Z79899 Other long term (current) drug therapy: Secondary | ICD-10-CM | POA: Diagnosis not present

## 2016-02-18 DIAGNOSIS — J342 Deviated nasal septum: Secondary | ICD-10-CM | POA: Diagnosis not present

## 2016-02-18 DIAGNOSIS — J320 Chronic maxillary sinusitis: Secondary | ICD-10-CM | POA: Diagnosis not present

## 2016-02-18 DIAGNOSIS — Z7902 Long term (current) use of antithrombotics/antiplatelets: Secondary | ICD-10-CM | POA: Diagnosis not present

## 2016-02-18 DIAGNOSIS — E785 Hyperlipidemia, unspecified: Secondary | ICD-10-CM | POA: Diagnosis not present

## 2016-02-18 DIAGNOSIS — E781 Pure hyperglyceridemia: Secondary | ICD-10-CM | POA: Diagnosis not present

## 2016-02-18 DIAGNOSIS — I10 Essential (primary) hypertension: Secondary | ICD-10-CM | POA: Diagnosis not present

## 2016-02-18 DIAGNOSIS — H6123 Impacted cerumen, bilateral: Secondary | ICD-10-CM | POA: Diagnosis not present

## 2016-02-18 DIAGNOSIS — Z7982 Long term (current) use of aspirin: Secondary | ICD-10-CM | POA: Diagnosis not present

## 2016-02-18 DIAGNOSIS — J45909 Unspecified asthma, uncomplicated: Secondary | ICD-10-CM | POA: Diagnosis not present

## 2016-02-18 DIAGNOSIS — I2510 Atherosclerotic heart disease of native coronary artery without angina pectoris: Secondary | ICD-10-CM | POA: Diagnosis not present

## 2016-02-18 DIAGNOSIS — E119 Type 2 diabetes mellitus without complications: Secondary | ICD-10-CM | POA: Diagnosis not present

## 2016-02-18 DIAGNOSIS — Z7984 Long term (current) use of oral hypoglycemic drugs: Secondary | ICD-10-CM | POA: Diagnosis not present

## 2016-02-18 DIAGNOSIS — Z87891 Personal history of nicotine dependence: Secondary | ICD-10-CM | POA: Diagnosis not present

## 2016-02-18 DIAGNOSIS — Z888 Allergy status to other drugs, medicaments and biological substances status: Secondary | ICD-10-CM | POA: Diagnosis not present

## 2016-02-25 ENCOUNTER — Encounter: Payer: Self-pay | Admitting: Family Medicine

## 2016-02-25 ENCOUNTER — Ambulatory Visit (INDEPENDENT_AMBULATORY_CARE_PROVIDER_SITE_OTHER): Payer: Medicare Other | Admitting: Family Medicine

## 2016-02-25 VITALS — BP 149/75 | HR 69 | Temp 96.9°F | Ht 71.0 in | Wt 190.6 lb

## 2016-02-25 DIAGNOSIS — K219 Gastro-esophageal reflux disease without esophagitis: Secondary | ICD-10-CM | POA: Diagnosis not present

## 2016-02-25 DIAGNOSIS — E1159 Type 2 diabetes mellitus with other circulatory complications: Secondary | ICD-10-CM

## 2016-02-25 DIAGNOSIS — I10 Essential (primary) hypertension: Secondary | ICD-10-CM | POA: Diagnosis not present

## 2016-02-25 MED ORDER — PANTOPRAZOLE SODIUM 40 MG PO TBEC: 40.0000 mg | DELAYED_RELEASE_TABLET | Freq: Every day | ORAL | 11 refills | Status: DC

## 2016-02-25 NOTE — Patient Instructions (Signed)
Great to see you!  I have sent in 1 new prescription, Pantoprazole for you rheartburn. Take 1 pill once daily.   Lets see you again in 2 months vfor diabetes follow up

## 2016-02-25 NOTE — Progress Notes (Signed)
   HPI  Patient presents today here for follow-up of diabetes, hypertension, and GERD.  GERD We tried to de-escalate to an H2 blocker, he still having persistent symptoms 4-5 times a week. He has heartburn type symptoms worse with laying down at night. He would like to try a stronger medication.  Hypertension Good medication compliance States that his blood pressure at home is 120s over 70s every morning. His pulse is also in the 70s. No chest pain, dyspnea, palpitations, leg edema.  Diabetes Average fasting blood sugar is 120-140 No hypoglycemia Good medication compliance with metformin   PMH: Smoking status noted ROS: Per HPI  Objective: BP (!) 149/75   Pulse 69   Temp (!) 96.9 F (36.1 C) (Oral)   Ht 5\' 11"  (1.803 m)   Wt 190 lb 9.6 oz (86.5 kg)   BMI 26.58 kg/m  Gen: NAD, alert, cooperative with exam HEENT: NCAT, EOMI, PERRL CV: RRR, good S1/S2, no murmur Resp: CTABL, no wheezes, non-labored Ext: No edema, warm Neuro: Alert and oriented, No gross deficits  Assessment and plan:  # Type 2 diabetes Fasting blood sugars are controlled, A1c not due yet Continue current medications  # Hypertension Elevated today, however good at home Continue to follow, no changes today  # GERD Symptoms worsening Failed H2 blockers, re-escalate to PPI   Orders Placed This Encounter  Procedures  . Microalbumin / creatinine urine ratio    Meds ordered this encounter  Medications  . aspirin EC 81 MG tablet    Sig: Take by mouth.  . pantoprazole (PROTONIX) 40 MG tablet    Sig: Take 1 tablet (40 mg total) by mouth daily.    Dispense:  30 tablet    Refill:  [place], MD [facility] 02/25/2016, 9:26 AM

## 2016-02-26 LAB — MICROALBUMIN / CREATININE URINE RATIO
CREATININE, UR: 60.1 mg/dL
MICROALB/CREAT RATIO: 25 mg/g{creat} (ref 0.0–30.0)
MICROALBUM., U, RANDOM: 15 ug/mL

## 2016-03-16 ENCOUNTER — Other Ambulatory Visit: Payer: Self-pay | Admitting: Family Medicine

## 2016-03-21 ENCOUNTER — Other Ambulatory Visit: Payer: Self-pay | Admitting: Family Medicine

## 2016-03-30 ENCOUNTER — Ambulatory Visit: Payer: Medicare Other | Admitting: Nurse Practitioner

## 2016-04-09 ENCOUNTER — Other Ambulatory Visit: Payer: Self-pay | Admitting: Family Medicine

## 2016-04-28 ENCOUNTER — Ambulatory Visit (INDEPENDENT_AMBULATORY_CARE_PROVIDER_SITE_OTHER): Payer: Medicare Other | Admitting: Family Medicine

## 2016-04-28 ENCOUNTER — Encounter: Payer: Self-pay | Admitting: Family Medicine

## 2016-04-28 VITALS — BP 136/74 | HR 68 | Temp 96.9°F | Ht 71.0 in | Wt 189.0 lb

## 2016-04-28 DIAGNOSIS — N183 Chronic kidney disease, stage 3 unspecified: Secondary | ICD-10-CM

## 2016-04-28 DIAGNOSIS — L609 Nail disorder, unspecified: Secondary | ICD-10-CM | POA: Diagnosis not present

## 2016-04-28 DIAGNOSIS — I10 Essential (primary) hypertension: Secondary | ICD-10-CM

## 2016-04-28 DIAGNOSIS — L608 Other nail disorders: Secondary | ICD-10-CM

## 2016-04-28 DIAGNOSIS — E1159 Type 2 diabetes mellitus with other circulatory complications: Secondary | ICD-10-CM

## 2016-04-28 DIAGNOSIS — J189 Pneumonia, unspecified organism: Secondary | ICD-10-CM

## 2016-04-28 LAB — BAYER DCA HB A1C WAIVED: HB A1C: 6.3 % (ref <7.0)

## 2016-04-28 MED ORDER — PREDNISONE 20 MG PO TABS: 40.0000 mg | ORAL_TABLET | Freq: Every day | ORAL | 0 refills | Status: DC

## 2016-04-28 MED ORDER — LEVOFLOXACIN 500 MG PO TABS: 500.0000 mg | ORAL_TABLET | Freq: Every day | ORAL | 0 refills | Status: DC

## 2016-04-28 NOTE — Patient Instructions (Signed)
Great to see you!  Start the prednisone and levaquin today, be sure to finish all antibiotics  Please come back if you are getting worse.    Community-Acquired Pneumonia, Adult Pneumonia is an infection of the lungs. There are different types of pneumonia. One type can develop while a person is in a hospital. A different type, called community-acquired pneumonia, develops in people who are not, or have not recently been, in the hospital or other health care facility.  CAUSES Pneumonia may be caused by bacteria, viruses, or funguses. Community-acquired pneumonia is often caused by Streptococcus pneumonia bacteria. These bacteria are often passed from one person to another by breathing in droplets from the cough or sneeze of an infected person. RISK FACTORS The condition is more likely to develop in:  People who havechronic diseases, such as chronic obstructive pulmonary disease (COPD), asthma, congestive heart failure, cystic fibrosis, diabetes, or kidney disease.  People who haveearly-stage or late-stage HIV.  People who havesickle cell disease.  People who havehad their spleen removed (splenectomy).  People who havepoor Human resources officer.  People who havemedical conditions that increase the risk of breathing in (aspirating) secretions their own mouth and nose.   People who havea weakened immune system (immunocompromised).  People who smoke.  People whotravel to areas where pneumonia-causing germs commonly exist.  People whoare around animal habitats or animals that have pneumonia-causing germs, including birds, bats, rabbits, cats, and farm animals. SYMPTOMS Symptoms of this condition include:  Adry cough.  A wet (productive) cough.  Fever.  Sweating.  Chest pain, especially when breathing deeply or coughing.  Rapid breathing or difficulty breathing.  Shortness of breath.  Shaking chills.  Fatigue.  Muscle aches. DIAGNOSIS Your health care provider  will take a medical history and perform a physical exam. You may also have other tests, including:  Imaging studies of your chest, including X-rays.  Tests to check your blood oxygen level and other blood gases.  Other tests on blood, mucus (sputum), fluid around your lungs (pleural fluid), and urine. If your pneumonia is severe, other tests may be done to identify the specific cause of your illness. TREATMENT The type of treatment that you receive depends on many factors, such as the cause of your pneumonia, the medicines you take, and other medical conditions that you have. For most adults, treatment and recovery from pneumonia may occur at home. In some cases, treatment must happen in a hospital. Treatment may include:  Antibiotic medicines, if the pneumonia was caused by bacteria.  Antiviral medicines, if the pneumonia was caused by a virus.  Medicines that are given by mouth or through an IV tube.  Oxygen.  Respiratory therapy. Although rare, treating severe pneumonia may include:  Mechanical ventilation. This is done if you are not breathing well on your own and you cannot maintain a safe blood oxygen level.  Thoracentesis. This procedureremoves fluid around one lung or both lungs to help you breathe better. HOME CARE INSTRUCTIONS  Take over-the-counter and prescription medicines only as told by your health care provider.  Only takecough medicine if you are losing sleep. Understand that cough medicine can prevent your body's natural ability to remove mucus from your lungs.  If you were prescribed an antibiotic medicine, take it as told by your health care provider. Do not stop taking the antibiotic even if you start to feel better.  Sleep in a semi-upright position at night. Try sleeping in a reclining chair, or place a few pillows under your head.  Do not use tobacco products, including cigarettes, chewing tobacco, and e-cigarettes. If you need help quitting, ask your  health care provider.  Drink enough water to keep your urine clear or pale yellow. This will help to thin out mucus secretions in your lungs. PREVENTION There are ways that you can decrease your risk of developing community-acquired pneumonia. Consider getting a pneumococcal vaccine if:  You are older than 80 years of age.  You are older than 80 years of age and are undergoing cancer treatment, have chronic lung disease, or have other medical conditions that affect your immune system. Ask your health care provider if this applies to you. There are different types and schedules of pneumococcal vaccines. Ask your health care provider which vaccination option is best for you. You may also prevent community-acquired pneumonia if you take these actions:  Get an influenza vaccine every year. Ask your health care provider which type of influenza vaccine is best for you.  Go to the dentist on a regular basis.  Wash your hands often. Use hand sanitizer if soap and water are not available. SEEK MEDICAL CARE IF:  You have a fever.  You are losing sleep because you cannot control your cough with cough medicine. SEEK IMMEDIATE MEDICAL CARE IF:  You have worsening shortness of breath.  You have increased chest pain.  Your sickness becomes worse, especially if you are an older adult or have a weakened immune system.  You cough up blood.   This information is not intended to replace advice given to you by your health care provider. Make sure you discuss any questions you have with your health care provider.   Document Released: 07/20/2005 Document Revised: 04/10/2015 Document Reviewed: 11/14/2014 Elsevier Interactive Patient Education [employer].

## 2016-04-28 NOTE — Progress Notes (Signed)
   HPI  Patient presents today here for follow-up of chronic medical conditions as well as an acute cough.  Cough Patient explains over last 1 week she's had thinly worsening cough, shortness of breath, chest tightness, cough is productive of yellow sputum. He also complains of malaise. He denies fever. He is tolerating food and fluids normally and has a normal appetite. He states that he spent most last week in bed. He is using albuterol with good improvement. He has continued Advair.  Diabetes Average postprandial is 120-150 No hypoglycemia No foot numbness He has difficult time cutting his own toenails  Hypertension Average at home is 591-638 systolic No chest pain, palpitations, leg edema Good medication compliance   PMH: Smoking status noted ROS: Per HPI  Objective: BP 136/74   Pulse 68   Temp (!) 96.9 F (36.1 C) (Oral)   Ht _0  (1.803 m)   Wt 189 lb (85.7 kg)   BMI 26.36 kg/m  Gen: NAD, alert, cooperative with exam HEENT: NCAT, TMs normal bilaterally, oropharynx clear CV: RRR, good S1/S2, no murmur Resp: Coarse breath sounds in left lower lung field, expiratory wheezes throughout, nonlabored, reasonable air movement Ext: No edema, warm Neuro: Alert and oriented  Diabetic Foot Exam - Simple   Simple Foot Form Visual Inspection See comments:  Yes Sensation Testing Intact to touch and monofilament testing bilaterally:  Yes Pulse Check Posterior Tibialis and Dorsalis pulse intact bilaterally:  Yes Comments Thickened long toenails, on the right third and fourth toe he has toenails so long that are curling down and nearly to the plantar surface of his toes.      Assessment and plan:  # Community-acquired pneumonia Considering history of asthma and lung findings today with one week of illness I'm treating for pneumonia/asthma exacerbation Levaquin, prednisone Short course of prednisone with diabetes.  # type 2 diabetes Well-controlled Podiatry for  very thick and long toenails   # Hypertension Well-controlled, continue Lotensin, HCTZ, metoprolol  # Chronic kidney disease stage III Rechecking labs, every three-month labs given treatment with metformin    Orders Placed This Encounter  Procedures  . Bayer DCA Hb A1c Waived  . CMP14+EGFR  . CBC with Differential    Meds ordered this encounter  Medications  . levofloxacin (LEVAQUIN) 500 MG tablet    Sig: Take 1 tablet (500 mg total) by mouth daily.    Dispense:  10 tablet    Refill:  0  . predniSONE (DELTASONE) 20 MG tablet    Sig: Take 2 tablets (40 mg total) by mouth daily with breakfast.    Dispense:  10 tablet    Refill:  0    Laroy Apple, MD [facility] 04/28/2016, 8:41 AM

## 2016-04-29 LAB — CBC WITH DIFFERENTIAL/PLATELET
BASOS ABS: 0 10*3/uL (ref 0.0–0.2)
Basos: 0 %
EOS (ABSOLUTE): 0.4 10*3/uL (ref 0.0–0.4)
Eos: 6 %
Hematocrit: 43.1 % (ref 37.5–51.0)
Hemoglobin: 15.4 g/dL (ref 12.6–17.7)
IMMATURE GRANS (ABS): 0 10*3/uL (ref 0.0–0.1)
IMMATURE GRANULOCYTES: 0 %
LYMPHS: 28 %
Lymphocytes Absolute: 1.8 10*3/uL (ref 0.7–3.1)
MCH: 33.7 pg — AB (ref 26.6–33.0)
MCHC: 35.7 g/dL (ref 31.5–35.7)
MCV: 94 fL (ref 79–97)
MONOS ABS: 0.6 10*3/uL (ref 0.1–0.9)
Monocytes: 8 %
NEUTROS ABS: 3.8 10*3/uL (ref 1.4–7.0)
NEUTROS PCT: 58 %
Platelets: 211 10*3/uL (ref 150–379)
RBC: 4.57 x10E6/uL (ref 4.14–5.80)
RDW: 12 % — AB (ref 12.3–15.4)
WBC: 6.6 10*3/uL (ref 3.4–10.8)

## 2016-04-29 LAB — CMP14+EGFR
A/G RATIO: 1.7 (ref 1.2–2.2)
ALBUMIN: 4.5 g/dL (ref 3.5–4.7)
ALT: 15 IU/L (ref 0–44)
AST: 17 IU/L (ref 0–40)
Alkaline Phosphatase: 78 IU/L (ref 39–117)
BUN / CREAT RATIO: 10 (ref 10–24)
BUN: 13 mg/dL (ref 8–27)
Bilirubin Total: 0.8 mg/dL (ref 0.0–1.2)
CALCIUM: 9.7 mg/dL (ref 8.6–10.2)
CO2: 26 mmol/L (ref 18–29)
Chloride: 92 mmol/L — ABNORMAL LOW (ref 96–106)
Creatinine, Ser: 1.34 mg/dL — ABNORMAL HIGH (ref 0.76–1.27)
GFR, EST AFRICAN AMERICAN: 56 mL/min/{1.73_m2} — AB (ref >59)
GFR, EST NON AFRICAN AMERICAN: 48 mL/min/{1.73_m2} — AB (ref >59)
GLUCOSE: 157 mg/dL — AB (ref 65–99)
Globulin, Total: 2.6 g/dL (ref 1.5–4.5)
Potassium: 3.8 mmol/L (ref 3.5–5.2)
Sodium: 135 mmol/L (ref 134–144)
TOTAL PROTEIN: 7.1 g/dL (ref 6.0–8.5)

## 2016-05-06 ENCOUNTER — Encounter: Payer: Self-pay | Admitting: *Deleted

## 2016-05-19 DIAGNOSIS — E119 Type 2 diabetes mellitus without complications: Secondary | ICD-10-CM | POA: Diagnosis not present

## 2016-05-19 DIAGNOSIS — B351 Tinea unguium: Secondary | ICD-10-CM | POA: Diagnosis not present

## 2016-05-19 DIAGNOSIS — L84 Corns and callosities: Secondary | ICD-10-CM | POA: Diagnosis not present

## 2016-05-26 NOTE — Progress Notes (Signed)
HPI The patient presents for followup of his known coronary disease. Since I last saw him he was hospitalized with a TIA in May.  I reviewed these records. MRI on 12/05/2015 showed left frontal punctate subcortical infarct. MRI on 12/10/2015 no acute infarct.  Out patient event monitor demonstrated rare atrial ectopy and ventricular ectopy.   He reports that last night he actually had a blood pressure systolic [id number]. His wife took his blood pressure reading after she thought his mouth looked funny.  His blood pressure was 192 hours later but then came back down. He is otherwise been well controlled. He has otherwise felt fine. 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  The patient denies any new symptoms such as chest discomfort, neck or arm discomfort. There has been no new shortness of breath, PND or orthopnea. There have been no reported palpitations, presyncope or syncope.  He does have leg pain in the back of his thighs when he walks.    Allergies  Allergen Reactions  . Codeine Other (See Comments)    Bad dreams  . Lopid [Gemfibrozil] Nausea Only  . Voltaren [Diclofenac Sodium] Diarrhea    Current Outpatient Prescriptions  Medication Sig Dispense Refill  . benazepril (LOTENSIN) 10 MG tablet Take 10 mg by mouth daily.    . Cholecalciferol (VITAMIN D3) 50000 UNITS CAPS Take 1 capsule by mouth once a week. (Patient taking differently: Take 1 capsule by mouth once a week. On Friday) 12 capsule 3  . clopidogrel (PLAVIX) 75 MG tablet TAKE ONE TABLET BY MOUTH DAILY 30 tablet 2  . CRESTOR 10 MG tablet TAKE ONE TABLET BY MOUTH ONCE DAILY 90 tablet 3  . feeding supplement, GLUCERNA SHAKE, (GLUCERNA SHAKE) LIQD Take 237 mLs by mouth 2 (two) times daily between meals. 60 Can 11  . fish oil-omega-3 fatty acids 1000 MG capsule Take 2 g by mouth daily.      . Fluticasone-Salmeterol (ADVAIR DISKUS) 100-50 MCG/DOSE AEPB INHALE ONE DOSE BY MOUTH EVERY 12 HOURS AS NEEDED (Patient taking differently:  Inhale 1 puff into the lungs every 12 (twelve) hours as needed (wheezing). ) 180 each 3  . hydrochlorothiazide (HYDRODIURIL) 25 MG tablet TAKE ONE-HALF TABLET BY MOUTH ONCE DAILY EXCEPT ON MONDAY, WEDNESDAY AND FRIDAY TAKE A WHOLE TABLET 90 tablet 3  . metFORMIN (GLUCOPHAGE-XR) 500 MG 24 hr tablet TAKE ONE TABLET BY MOUTH TWICE DAILY 180 tablet 3  . metoprolol (LOPRESSOR) 100 MG tablet TAKE ONE TABLET BY MOUTH TWICE DAILY 180 tablet 3  . nitroGLYCERIN (NITROSTAT) 0.4 MG SL tablet Place 0.4 mg under the tongue every 5 (five) minutes as needed for chest pain.    . pantoprazole (PROTONIX) 40 MG tablet Take 1 tablet (40 mg total) by mouth daily. 30 tablet 11  . PROAIR HFA 108 (90 Base) MCG/ACT inhaler INHALE TWO PUFFS BY MOUTH 4 TIMES DAILY AS NEEDED 9 each 2  . glucose blood test strip Check blood sugar twice daily and as needed. Dx Code 100 each 12  . hydrALAZINE (APRESOLINE) 25 MG tablet Take 1 tablet (25 mg total) by mouth daily as needed. 90 tablet 3   No current facility-administered medications for this visit.     Past Medical History:  Diagnosis Date  . Arthritis    "wrists, knees, back" (12/10/2015)  . Asthma   . Cerebral atherosclerosis   . Chronic back pain    "lower primarily" (12/10/2015)  . CORONARY ATHEROSCLEROSIS NATIVE CORONARY ARTERY    Myocardial infarction at  [facility] in [place] with a directional atherectomy of the 90% LAD stenosis and medical management of 60-70% proximal circumflex and 50% RCA stenosis.  . DYSLIPIDEMIA   . GERD (gastroesophageal reflux disease)   . History of hiatal hernia   . HYPERTENSION   . ILIAC ARTERY ANEURYSM   . Myocardial infarction 1996  . Stroke [facility]) 2003; 2013; 07/2015; 12/05/2015   denies residual on 12/10/2015  . TIA (transient ischemic attack)   . Type II diabetes mellitus ([place])   . UPPER RESPIRATORY INFECTION     Past Surgical History:  Procedure Laterality Date  . ANAL FISSURE REPAIR  1990s  . BASAL CELL CARCINOMA EXCISION Right  12/2015   "temple"  . CATARACT EXTRACTION W/ INTRAOCULAR LENS  IMPLANT, BILATERAL Bilateral 2000s  . CORONARY ANGIOPLASTY  1996  . KNEE CARTILAGE SURGERY Right 1970s  . SHOULDER ARTHROSCOPY W/ ROTATOR CUFF REPAIR Left 2000s    ROS:  Back pain, URI symptoms.  Otherwise as stated in the HPI and negative for all other systems.  PHYSICAL EXAM BP (!) 138/96   Pulse 66   Ht 5\' 11"  (1.803 m)   Wt 190 lb (86.2 kg)   BMI 26.50 kg/m  GENERAL:  Well appearing NECK:  No jugular venous distention, waveform within normal limits, carotid upstroke brisk and symmetric,soft  right bruit, no thyromegaly LUNGS:  Clear to auscultation bilaterally BACK:  No CVA tenderness CHEST: Unremarkable HEART:  PMI not displaced or sustained,S1 and S2 within normal limits, no S3, no S4, no clicks, no rubs, soft apical early peaking systolic murmur ABD:  Flat, positive bowel sounds normal in frequency in pitch, no bruits, no rebound, no guarding, no midline pulsatile mass, no hepatomegaly, no splenomegaly EXT:  2 plus pulses upper, decreased DP/PT, mild edema, no cyanosis no clubbing   EKG:  Sinus rhythm, rate 67, axis within normal limits, intervals within normal, no acute ST-T wave changes.  05/27/2016   ASSESSMENT AND PLAN  CAD:  The patient has no new sypmtoms since stress testing in 2012.  No further cardiovascular testing is indicated.  We will continue with aggressive risk reduction and meds as listed.     HTN:   His blood pressure occasionally spikes and I will give him hydralazine 10 mg to take when necessary if his systolic is greater than 99991111.  HYPERLIPIDEMIA:   Per. Kenn File, MD  CAROTID STENOSIS:  He had multiple images with his recent hospitalization and had no high grade disease.  He will follow up next year again with imaging.    DM:  He will continue on the meds as listed. His A1c was 6.1.  Lab Results  Component Value Date   HGBA1C 6.1 (H) 12/10/2015   CLAUDICATION:  I will order  ABIs.

## 2016-05-27 ENCOUNTER — Ambulatory Visit (INDEPENDENT_AMBULATORY_CARE_PROVIDER_SITE_OTHER): Payer: Medicare Other | Admitting: Cardiology

## 2016-05-27 ENCOUNTER — Other Ambulatory Visit: Payer: Self-pay | Admitting: Cardiology

## 2016-05-27 ENCOUNTER — Encounter: Payer: Self-pay | Admitting: Cardiology

## 2016-05-27 VITALS — BP 138/96 | HR 66 | Ht 71.0 in | Wt 190.0 lb

## 2016-05-27 DIAGNOSIS — M79606 Pain in leg, unspecified: Secondary | ICD-10-CM

## 2016-05-27 DIAGNOSIS — I10 Essential (primary) hypertension: Secondary | ICD-10-CM | POA: Diagnosis not present

## 2016-05-27 MED ORDER — HYDRALAZINE HCL 25 MG PO TABS: 25.0000 mg | ORAL_TABLET | Freq: Every day | ORAL | 3 refills | Status: DC | PRN

## 2016-05-27 NOTE — Patient Instructions (Signed)
Medication Instructions:  Please take Hydralazine 25 mg a day as needed. Continue all other medications as listed.  Testing/Procedures: Your physician has requested that you have a lower extremity arterial exercise duplex. During this test, exercise and ultrasound are used to evaluate arterial blood flow in the legs. Allow one hour for this exam. There are no restrictions or special instructions.  Follow-Up: Follow up in 6 months with Dr. Percival Spanish.  You will receive a letter in the mail 2 months before you are due.  Please call us when you receive this letter to schedule your follow up appointment.  If you need a refill on your cardiac medications before your next appointment, please call your pharmacy.  Thank you for choosing [facility]!!

## 2016-06-10 ENCOUNTER — Ambulatory Visit (INDEPENDENT_AMBULATORY_CARE_PROVIDER_SITE_OTHER): Payer: Medicare Other

## 2016-06-10 DIAGNOSIS — Z23 Encounter for immunization: Secondary | ICD-10-CM

## 2016-06-15 ENCOUNTER — Other Ambulatory Visit: Payer: Self-pay | Admitting: Family Medicine

## 2016-06-15 DIAGNOSIS — I10 Essential (primary) hypertension: Secondary | ICD-10-CM

## 2016-06-22 ENCOUNTER — Ambulatory Visit (HOSPITAL_COMMUNITY)
Admission: RE | Admit: 2016-06-22 | Discharge: 2016-06-22 | Disposition: A | Discharge status: Home / Self Care | Payer: Medicare Other | Source: Ambulatory Visit | Attending: Cardiology | Admitting: Cardiology

## 2016-06-22 DIAGNOSIS — M79662 Pain in left lower leg: Secondary | ICD-10-CM | POA: Diagnosis not present

## 2016-06-22 DIAGNOSIS — I739 Peripheral vascular disease, unspecified: Secondary | ICD-10-CM | POA: Diagnosis not present

## 2016-06-22 DIAGNOSIS — M79606 Pain in leg, unspecified: Secondary | ICD-10-CM | POA: Diagnosis not present

## 2016-06-22 DIAGNOSIS — M79661 Pain in right lower leg: Secondary | ICD-10-CM | POA: Diagnosis not present

## 2016-06-30 ENCOUNTER — Telehealth: Payer: Self-pay | Admitting: *Deleted

## 2016-06-30 DIAGNOSIS — M79604 Pain in right leg: Secondary | ICD-10-CM

## 2016-06-30 DIAGNOSIS — M79605 Pain in left leg: Principal | ICD-10-CM

## 2016-06-30 NOTE — Telephone Encounter (Signed)
Pt is aware his ABI is inconclusive and he will need to have an LEA done, LEA ordered and send to scheduler to be schedule

## 2016-06-30 NOTE — Telephone Encounter (Signed)
-----   Message from Minus Breeding, MD sent at 06/24/2016  5:00 PM EST ----- Please order lower extremity arterial Dopplers.  ABIs were inconclusive.  Call Mr. Lysaght with the results and send results to Kenn File, MD

## 2016-07-09 ENCOUNTER — Ambulatory Visit (INDEPENDENT_AMBULATORY_CARE_PROVIDER_SITE_OTHER): Payer: Medicare Other

## 2016-07-09 ENCOUNTER — Encounter: Payer: Self-pay | Admitting: Family Medicine

## 2016-07-09 ENCOUNTER — Ambulatory Visit (INDEPENDENT_AMBULATORY_CARE_PROVIDER_SITE_OTHER): Payer: Medicare Other | Admitting: Family Medicine

## 2016-07-09 VITALS — BP 148/82 | HR 67 | Temp 97.5°F | Ht 71.0 in | Wt 192.2 lb

## 2016-07-09 DIAGNOSIS — J45901 Unspecified asthma with (acute) exacerbation: Secondary | ICD-10-CM

## 2016-07-09 MED ORDER — PREDNISONE 20 MG PO TABS: 40.0000 mg | ORAL_TABLET | Freq: Every day | ORAL | 0 refills | Status: DC

## 2016-07-09 NOTE — Progress Notes (Signed)
   HPI  Patient presents today here with cough.  Patient states that after being seen last time, about 2 months ago, he had complete resolution of his symptoms. Over the last 3-4 weeks however he's had cough persistent, shortness of breath, and malaise.  He states that he does not seem to be getting better. He denies any fever, chills, sweats, chest pain, sore throat, facial pain, or ear pain.  His albuterol inhaler is helping very much, he still taking Advair twice daily   PMH: Smoking status noted ROS: Per HPI  Objective: BP (!) 148/82   Pulse 67   Temp 97.5 F (36.4 C) (Oral)   Ht 5\' 11"  (1.803 m)   Wt 192 lb 3.2 oz (87.2 kg)   BMI 26.81 kg/m  Gen: NAD, alert, cooperative with exam HEENT: NCAT, oropharynx clear and moist, nares clear, TMs obscured by cerumen  CV: RRR, good S1/S2, no murmur Resp: Nonlabored, good air movement, expiratory wheezes with some rhonchorous sounds in the right lower lung field  Ext: No edema, warm Neuro: Alert and oriented, No gross deficits  Assessment and plan:  # Asthma exacerbation No clear infection at this point, chest x-ray pending Prednisone burst for 7 days Continue albuterol as needed Return to clinic with any worsening symptoms or failure to improve as expected.   Orders Placed This Encounter  Procedures  . DG Chest 2 View    Standing Status:   Future    Standing Expiration Date:   09/09/2017    Order Specific Question:   Reason for Exam (SYMPTOM  OR DIAGNOSIS REQUIRED)    Answer:   cough, eval for CAP    Order Specific Question:   Preferred imaging location?    Answer:   Internal    Meds ordered this encounter  Medications  . DISCONTD: predniSONE (DELTASONE) 20 MG tablet    Sig: Take 2 tablets (40 mg total) by mouth daily with breakfast.    Dispense:  14 tablet    Refill:  0  . predniSONE (DELTASONE) 20 MG tablet    Sig: Take 2 tablets (40 mg total) by mouth daily with breakfast.    Dispense:  14 tablet    Refill:  0      Laroy Apple, MD [facility] 07/09/2016, 8:45 AM

## 2016-07-09 NOTE — Patient Instructions (Signed)
Great to see you!  We will call with results from the x ray by the end of the day.   Start prednisone today, this will cause an elevation in your blood sugar   Acute Bronchitis, Adult Acute bronchitis is when air tubes (bronchi) in the lungs suddenly get swollen. The condition can make it hard to breathe. It can also cause these symptoms:  A cough.  Coughing up clear, yellow, or green mucus.  Wheezing.  Chest congestion.  Shortness of breath.  A fever.  Body aches.  Chills.  A sore throat. Follow these instructions at home: Medicines  Take over-the-counter and prescription medicines only as told by your doctor.  If you were prescribed an antibiotic medicine, take it as told by your doctor. Do not stop taking the antibiotic even if you start to feel better. General instructions  Rest.  Drink enough fluids to keep your pee (urine) clear or pale yellow.  Avoid smoking and secondhand smoke. If you smoke and you need help quitting, ask your doctor. Quitting will help your lungs heal faster.  Use an inhaler, cool mist vaporizer, or humidifier as told by your doctor.  Keep all follow-up visits as told by your doctor. This is important. How is this prevented? To lower your risk of getting this condition again:  Wash your hands often with soap and water. If you cannot use soap and water, use hand sanitizer.  Avoid contact with people who have cold symptoms.  Try not to touch your hands to your mouth, nose, or eyes.  Make sure to get the flu shot every year. Contact a doctor if:  Your symptoms do not get better in 2 weeks. Get help right away if:  You cough up blood.  You have chest pain.  You have very bad shortness of breath.  You become dehydrated.  You faint (pass out) or keep feeling like you are going to pass out.  You keep throwing up (vomiting).  You have a very bad headache.  Your fever or chills gets worse. This information is not intended to  replace advice given to you by your health care provider. Make sure you discuss any questions you have with your health care provider. Document Released: 01/06/2008 Document Revised: 02/26/2016 Document Reviewed: 01/08/2016 Elsevier Interactive Patient Education  2017 [employer].

## 2016-07-14 ENCOUNTER — Other Ambulatory Visit: Payer: Self-pay | Admitting: Dermatology

## 2016-07-14 DIAGNOSIS — D0422 Carcinoma in situ of skin of left ear and external auricular canal: Secondary | ICD-10-CM | POA: Diagnosis not present

## 2016-07-14 DIAGNOSIS — L570 Actinic keratosis: Secondary | ICD-10-CM | POA: Diagnosis not present

## 2016-07-14 DIAGNOSIS — C44319 Basal cell carcinoma of skin of other parts of face: Secondary | ICD-10-CM | POA: Diagnosis not present

## 2016-07-14 DIAGNOSIS — D044 Carcinoma in situ of skin of scalp and neck: Secondary | ICD-10-CM | POA: Diagnosis not present

## 2016-07-28 ENCOUNTER — Ambulatory Visit (HOSPITAL_COMMUNITY)
Admission: RE | Admit: 2016-07-28 | Discharge: 2016-07-28 | Disposition: A | Discharge status: Home / Self Care | Payer: Medicare Other | Source: Ambulatory Visit | Attending: Cardiology | Admitting: Cardiology

## 2016-07-28 DIAGNOSIS — M79605 Pain in left leg: Secondary | ICD-10-CM | POA: Diagnosis not present

## 2016-07-28 DIAGNOSIS — M79604 Pain in right leg: Secondary | ICD-10-CM

## 2016-07-28 DIAGNOSIS — I739 Peripheral vascular disease, unspecified: Secondary | ICD-10-CM | POA: Diagnosis not present

## 2016-07-30 ENCOUNTER — Ambulatory Visit: Payer: Medicare Other | Admitting: Family Medicine

## 2016-08-05 ENCOUNTER — Ambulatory Visit (INDEPENDENT_AMBULATORY_CARE_PROVIDER_SITE_OTHER): Payer: Medicare Other | Admitting: Family Medicine

## 2016-08-05 ENCOUNTER — Encounter: Payer: Self-pay | Admitting: Family Medicine

## 2016-08-05 VITALS — BP 122/76 | HR 76 | Temp 96.8°F | Ht 71.0 in | Wt 190.6 lb

## 2016-08-05 DIAGNOSIS — K219 Gastro-esophageal reflux disease without esophagitis: Secondary | ICD-10-CM | POA: Diagnosis not present

## 2016-08-05 DIAGNOSIS — R059 Cough, unspecified: Secondary | ICD-10-CM

## 2016-08-05 DIAGNOSIS — E1159 Type 2 diabetes mellitus with other circulatory complications: Secondary | ICD-10-CM | POA: Diagnosis not present

## 2016-08-05 DIAGNOSIS — R05 Cough: Secondary | ICD-10-CM

## 2016-08-05 DIAGNOSIS — R002 Palpitations: Secondary | ICD-10-CM

## 2016-08-05 LAB — BAYER DCA HB A1C WAIVED: HB A1C: 6.8 % (ref <7.0)

## 2016-08-05 MED ORDER — AZITHROMYCIN 250 MG PO TABS: ORAL_TABLET | ORAL | 0 refills | Status: DC

## 2016-08-05 NOTE — Patient Instructions (Signed)
Great to see you!  Please take all antibiotics.   Please call Dr. Percival Spanish for a follow up to discuss the Flutter you have described to me.

## 2016-08-05 NOTE — Progress Notes (Addendum)
   HPI  Patient presents today for chronic follow-up and cough.  GERD Not helped very well by Protonix, depression is only taking as needed around 8:00 at night. Previously helped very well by daily Prilosec.  Type 2 diabetes Fasting blood sugar averages 150-180 Good medication compliance  Cough Patient seen on 07/09/2016 for cough, treated with prednisone and had a chest x-ray ruling out CAP at that time. Patient states that he improved but did not have complete resolution. Still struggling with cough. Also complains of some palpitations. These are described as "flutter" associated with shortness of breath.  PMH: Smoking status noted ROS: Per HPI  Objective: BP 122/76   Pulse 76   Temp (!) 96.8 F (36 C) (Oral)   Ht _0  (1.803 m)   Wt 190 lb 9.6 oz (86.5 kg)   BMI 26.58 kg/m  Gen: NAD, alert, cooperative with exam HEENT: NCAT CV: RRR, good S1/S2, no murmur Resp: CTABL, no wheezes, non-labored Ext: No edema, warm Neuro: Alert and oriented, No gross deficits  Assessment and plan:  # Cough Likely resolving as next aspiration, however with persistent nagging cough I have gone ahead and covered him with azithromycin given its prolonged course. Continue albuterol and Advair. Consider titrating advair  # Palpitations Worrisome palpitations with symptoms of "being smothered". EKG today shows no new changes.  Recommended close follow-up with cardiology  After more careful discussion he has been having these episodes, even during his time with a holter monitor.  Suspect PAC's and anxiety/.   # Type 2 diabetes A1c pending, suspect reasonably stable control Repeat CMP, metformin with CK D3.  # GERD Recommended daily PPI instead of as needed, explains six-hour onset of medication effect     Orders Placed This Encounter  Procedures  . Bayer DCA Hb A1c Waived  . CMP14+EGFR  . EKG 12-Lead    Meds ordered this encounter  Medications  . azithromycin (ZITHROMAX)  250 MG tablet    Sig: Take 2 tablets on day 1 and 1 tablet daily after that    Dispense:  6 tablet    Refill:  0    Laroy Apple, MD [facility] 08/05/2016, 8:28 AM

## 2016-08-06 LAB — CMP14+EGFR
A/G RATIO: 1.8 (ref 1.2–2.2)
ALBUMIN: 4.6 g/dL (ref 3.5–4.7)
ALT: 18 IU/L (ref 0–44)
AST: 18 IU/L (ref 0–40)
Alkaline Phosphatase: 73 IU/L (ref 39–117)
BUN/Creatinine Ratio: 11 (ref 10–24)
BUN: 13 mg/dL (ref 8–27)
Bilirubin Total: 1.1 mg/dL (ref 0.0–1.2)
CALCIUM: 9.4 mg/dL (ref 8.6–10.2)
CO2: 22 mmol/L (ref 18–29)
Chloride: 92 mmol/L — ABNORMAL LOW (ref 96–106)
Creatinine, Ser: 1.14 mg/dL (ref 0.76–1.27)
GFR, EST AFRICAN AMERICAN: 68 mL/min/{1.73_m2} (ref >59)
GFR, EST NON AFRICAN AMERICAN: 59 mL/min/{1.73_m2} — AB (ref >59)
GLOBULIN, TOTAL: 2.5 g/dL (ref 1.5–4.5)
Glucose: 157 mg/dL — ABNORMAL HIGH (ref 65–99)
POTASSIUM: 3.8 mmol/L (ref 3.5–5.2)
SODIUM: 135 mmol/L (ref 134–144)
TOTAL PROTEIN: 7.1 g/dL (ref 6.0–8.5)

## 2016-08-10 ENCOUNTER — Ambulatory Visit (INDEPENDENT_AMBULATORY_CARE_PROVIDER_SITE_OTHER): Payer: Medicare Other | Admitting: Nurse Practitioner

## 2016-08-10 ENCOUNTER — Encounter: Payer: Self-pay | Admitting: Nurse Practitioner

## 2016-08-10 VITALS — BP 130/70 | HR 57 | Ht 71.0 in | Wt 188.0 lb

## 2016-08-10 DIAGNOSIS — E785 Hyperlipidemia, unspecified: Secondary | ICD-10-CM | POA: Diagnosis not present

## 2016-08-10 DIAGNOSIS — I639 Cerebral infarction, unspecified: Secondary | ICD-10-CM

## 2016-08-10 DIAGNOSIS — I10 Essential (primary) hypertension: Secondary | ICD-10-CM

## 2016-08-10 NOTE — Progress Notes (Signed)
[facility]  PATIENT: [name] DOB: 1930-06-16   REASON FOR VISIT: Follow-up for stroke  HISTORY FROM: Patient    HISTORY OF PRESENT ILLNESS:PS85 year Caucasian male seen today for first office follow-up visit following hospital admission for stroke in December 2016. [name] is an 83 y.o. male with a past medical history significant for HTN, DM, CAD, MI, TIA, iliac artery aneurysm, brought in by EMS as a code stroke due to acute onset onset of right hand weakness, right lower face weakness, difficulty talking. Wife is at the bedside and said that they were eating lunch when he began to have trouble using his hands and dropped a cup from his right hand. Then, he started having difficulty expressing himself and she noticed some right face droopiness and thus EMS was summoned. (LKW 07/14/2105 at 1230p). Initial NIHSS 2, with fluctuating non fluent spontaneous language. CT brain was personally reviewed and showed no acute abnormality. Denies HA, vertigo, double vision, focal weakness or numbness, confusion, or vision impairment. Patient takes aggrenox daily. NIHSS 2. Modified Rankin score: 0. Patient was administered TPA 07/14/2105 at 1524. He was admitted to the stroke floor for further evaluation and treatment. CT scan of the head on admission showed no acute abnormality. He was administered IV tPA uneventfully without any complications and showed significant clinical improvement. MRI scan of the brain showed small left internal capsule infarct. MRA of the brain showed moderate changes of indeterminate atherosclerosis but the left middle cerebral artery was wide open. Transthoracic echo showed normal ejection fraction without cardiac source of embolism. EEG showed no seizure activity. LDL cholesterol was marginally elevated at 108. Hemoglobin A1c in October 2016 was 6.2. Patient did well and was seen by physical occupational therapy and had no therapy needs and was  returned home and is tolerating Plavix well without bleeding bruising or other side effects. He states his blood pressure is doing good and today it was 124/78 in office. His fasting sugars are slightly high in the 130s range. He is eating healthy and does walk a lot. He has gradually lost 20-30 pounds over the last 2 years. He had lab work done by his primary physician last week which was apparently fine but I do not have those results. He has no complaints today. Update 02/05/2016 :PS He returns for follow-up after last visit 4 months ago. Patient was readmitted on 5th  May 2017 with recurrent dysarthria and MRI scan of the brain showed a new small left frontal punctate lacunar infarct. He had been on Aggrenox which was switched to Plavix. We did not repeat stroke workup since he had previously had extensive workup in December 2016. Patient is discharged home but came back a few days later with transient worsening of his speech. A repeat MRI scan did not show another new infarct this time but Dr. Erlinda Hong saw the patient and added aspirin 81 mg to his Plavix. For 3 months. Patient states his done well since his second discharge. His abdomen or recurrent speech difficulties or any other new strokelike symptoms. He is tolerating aspirin and Plavix with only minor bruising but no bleeding episodes. His blood pressure is well controlled and today it is 128/79. His past sugars have all been in the 120s range. He does not have any new complaints today. UPDATE 08/10/2016 [name], 83 year old male returns for follow-up with history of small internal capsule infarct in December 2016 and readmitted 12/06/2015 with recurrent dysarthria and MRI showed  new small left frontal lacunar infarct. His Aggrenox was switched to Plavix at that time along with aspirin. He is currently on just Plavix with minimal bruising and bleeding. He denies further stroke or TIA symptoms. Most recent labs in September 2017 were stable. He is also  diabetic and remains on Glucophage. He is on Crestor without complaints of myalgias. Blood pressure well controlled in the office today at 130/70. He returns for reevaluation without new neurologic complaints  REVIEW OF SYSTEMS: Full 14 system review of systems performed and notable only for those listed, all others are neg:  Constitutional: neg  Cardiovascular: neg Ear/Nose/Throat: neg  Skin: neg Eyes: neg Respiratory: Recent upper respiratory infection, cough Gastroitestinal: neg  Hematology/Lymphatic: neg  Endocrine: neg Musculoskeletal: Right knee pain Allergy/Immunology: neg Neurological: neg Psychiatric: neg Sleep : neg   ALLERGIES: Allergies  Allergen Reactions  . Codeine Other (See Comments)    Bad dreams  . Lopid [Gemfibrozil] Nausea Only  . Voltaren [Diclofenac Sodium] Diarrhea    HOME MEDICATIONS: Outpatient Medications Prior to Visit  Medication Sig Dispense Refill  . ADVAIR DISKUS 100-50 MCG/DOSE AEPB INHALE ONE DOSE BY MOUTH EVERY 12 HOURS AS NEEDED 180 each 2  . benazepril (LOTENSIN) 20 MG tablet TAKE ONE TABLET BY MOUTH ONCE DAILY 90 tablet 0  . clopidogrel (PLAVIX) 75 MG tablet TAKE ONE TABLET BY MOUTH DAILY 30 tablet 3  . CRESTOR 10 MG tablet TAKE ONE TABLET BY MOUTH ONCE DAILY 90 tablet 3  . DIALYVITE VITAMIN D3 MAX 60454 units TABS TAKE ONE TABLET BY MOUTH ONCE A WEEK 12 tablet 0  . feeding supplement, GLUCERNA SHAKE, (GLUCERNA SHAKE) LIQD Take 237 mLs by mouth 2 (two) times daily between meals. 60 Can 11  . fish oil-omega-3 fatty acids 1000 MG capsule Take 2 g by mouth daily.      Marland Kitchen glucose blood test strip Check blood sugar twice daily and as needed. Dx Code 100 each 12  . hydrALAZINE (APRESOLINE) 25 MG tablet Take 1 tablet (25 mg total) by mouth daily as needed. 90 tablet 3  . hydrochlorothiazide (HYDRODIURIL) 25 MG tablet TAKE ONE-HALF TABLET BY MOUTH ONCE DAILY EXCEPT ON MONDAY, WEDNESDAY AND FRIDAY TAKE A WHOLE TABLET 90 tablet 3  . metFORMIN  (GLUCOPHAGE-XR) 500 MG 24 hr tablet TAKE ONE TABLET BY MOUTH TWICE DAILY 180 tablet 3  . metoprolol (LOPRESSOR) 100 MG tablet TAKE ONE TABLET BY MOUTH TWICE DAILY 180 tablet 3  . nitroGLYCERIN (NITROSTAT) 0.4 MG SL tablet Place 0.4 mg under the tongue every 5 (five) minutes as needed for chest pain.    . pantoprazole (PROTONIX) 40 MG tablet Take 1 tablet (40 mg total) by mouth daily. 30 tablet 11  . PROAIR HFA 108 (90 Base) MCG/ACT inhaler INHALE TWO PUFFS BY MOUTH 4 TIMES DAILY AS NEEDED 9 each 2  . azithromycin (ZITHROMAX) 250 MG tablet Take 2 tablets on day 1 and 1 tablet daily after that (Patient not taking: Reported on 08/10/2016) 6 tablet 0  . benazepril (LOTENSIN) 10 MG tablet Take 10 mg by mouth daily.     No facility-administered medications prior to visit.     PAST MEDICAL HISTORY: Past Medical History:  Diagnosis Date  . Arthritis    "wrists, knees, back" (12/10/2015)  . Asthma   . Cerebral atherosclerosis   . Chronic back pain    "lower primarily" (12/10/2015)  . CORONARY ATHEROSCLEROSIS NATIVE CORONARY ARTERY    Myocardial infarction at [facility] in 1996 with a directional  atherectomy of the 90% LAD stenosis and medical management of 60-70% proximal circumflex and 50% RCA stenosis.  . DYSLIPIDEMIA   . GERD (gastroesophageal reflux disease)   . History of hiatal hernia   . HYPERTENSION   . ILIAC ARTERY ANEURYSM   . Myocardial infarction 1996  . Stroke [facility]) 2003; 2013; 07/2015; 12/05/2015   denies residual on 12/10/2015  . TIA (transient ischemic attack)   . Type II diabetes mellitus ([place])   . UPPER RESPIRATORY INFECTION     PAST SURGICAL HISTORY: Past Surgical History:  Procedure Laterality Date  . ANAL FISSURE REPAIR  1990s  . BASAL CELL CARCINOMA EXCISION Right 12/2015   "temple"  . CATARACT EXTRACTION W/ INTRAOCULAR LENS  IMPLANT, BILATERAL Bilateral 2000s  . CORONARY ANGIOPLASTY  1996  . KNEE CARTILAGE SURGERY Right 1970s  . SHOULDER ARTHROSCOPY W/ ROTATOR CUFF  REPAIR Left 2000s    FAMILY HISTORY: Family History  Problem Relation Age of Onset  . Coronary artery disease Mother   . Cancer Father     lung  . Stroke Father   . Cancer Brother     lung  . Stroke Sister     SOCIAL HISTORY: Social History   Social History  . Marital status: Married    Spouse name: N/A  . Number of children: N/A  . Years of education: N/A   Occupational History  . 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 Smoking status: Former Smoker    Packs/day: 0.12    Years: 45.00    Types: Cigarettes    Quit date: 11/24/1984  . Smokeless tobacco: Former Systems developer    Types: Snuff     Comment: "dipped snuff when I was a boy"  . Alcohol use No  . Drug use: No  . Sexual activity: No   Other Topics Concern  . Not on file   Social History Narrative  . No narrative on file     PHYSICAL EXAM  Vitals:   08/10/16 0940  BP: 130/70  Pulse: (!) 57  Weight: 188 lb (85.3 kg)  Height: 5\' 11"  (1.803 m)   Body mass index is 26.22 kg/m. General: well developed, well nourished elderly Caucasian male, seated, in no evident distress Head: head normocephalic and atraumatic.  Neck: supple with no carotid bruits Cardiovascular: regular rate and rhythm, no murmurs Musculoskeletal: no deformity Skin:  no rash/petichiae Vascular:  Normal pulses all extremities  Neurological examination  Mental Status: Awake and fully alert. Oriented to place and time. Recent and remote memory intact. Attention span, concentration and fund of knowledge appropriate. Mood and affect appropriate.  Cranial Nerves:  Pupils equal, briskly reactive to light. Extraocular movements full without nystagmus. Visual fields full to confrontation. Hearing intact. Facial sensation intact. Face, tongue, palate moves normally and symmetrically.  Motor: Normal bulk and tone. Normal strength in all tested extremity muscles.Diminished fine finger movements on the right. Orbits left over right  upper extremity. Sensory.: intact to touch ,pinprick  and vibratory sensation.  Coordination: Rapid alternating movements normal in all extremities. Finger-to-nose and heel-to-shin performed accurately bilaterally. Gait and Station: Arises from chair without difficulty. Stance is normal. Gait demonstrates normal stride length and balance . Able to heel, toe and tandem walk without difficulty.  Reflexes: 1+ and symmetric. Toes downgoing.   DIAGNOSTIC DATA (LABS, IMAGING, TESTING) - I reviewed patient records, labs, notes, testing and imaging myself where available.  Lab Results  Component Value Date  WBC 6.6 04/28/2016   HGB 17.0 12/10/2015   HCT 43.1 04/28/2016   MCV 94 04/28/2016   PLT 211 04/28/2016      Component Value Date/Time   NA 135 08/05/2016 0847   K 3.8 08/05/2016 0847   CL 92 (L) 08/05/2016 0847   CO2 22 08/05/2016 0847   GLUCOSE 157 (H) 08/05/2016 0847   GLUCOSE 142 (H) 12/10/2015 1245   BUN 13 08/05/2016 0847   CREATININE 1.14 08/05/2016 0847   CREATININE 1.27 02/02/2013 0853   CALCIUM 9.4 08/05/2016 0847   PROT 7.1 08/05/2016 0847   ALBUMIN 4.6 08/05/2016 0847   AST 18 08/05/2016 0847   ALT 18 08/05/2016 0847   ALKPHOS 73 08/05/2016 0847   BILITOT 1.1 08/05/2016 0847   GFRNONAA 59 (L) 08/05/2016 0847   GFRNONAA 53 (L) 02/02/2013 0853   GFRAA 68 08/05/2016 0847   GFRAA 61 02/02/2013 0853   Lab Results  Component Value Date   CHOL 115 12/06/2015   HDL 36 (L) 12/06/2015   LDLCALC 32 12/06/2015   TRIG 233 (H) 12/06/2015   CHOLHDL 3.2 12/06/2015   Lab Results  Component Value Date   HGBA1C 6.1 (H) 12/10/2015    ASSESSMENT AND PLAN 86year patient with small left internal capsule infarct secondary to small vessel disease in December 2017 treated with IV TPA with full functional recovery. Vascular risk factors of diabetes, hyperlipidemia, age and sex.Recurrent lacunar infarct in May 2017 with repeat admission few days later with transient worsening of  symptoms. The patient is a current patient of Dr. Leonie Man who is out of the office today . This note is sent to the work in doctor.     PLAN: Continue Plavix alone for secondary stroke prevention maintain strict control of hypertension with blood pressure goal below 130/90, today's reading 130/70  diabetes with hemoglobin A1c goal below 6.5% continue diabetic medications lipids with LDL cholesterol goal below 70 mg/dL. continue Crestor eat a healthy diet with plenty of whole grains, cereals, fruits and vegetables, exercise regularly and maintain ideal body weight  Follow-up in 6 months, if stable at that time will discharge Greater than 50% time during this 20 minute visit was spent on counseling and coordination of care about stroke risk, prevention and treatment Dennie Bible, [facility], [facility], [facility] [address], [place], [place] [address] [phone]

## 2016-08-10 NOTE — Patient Instructions (Signed)
Continue Plavix alone for secondary stroke prevention maintain strict control of hypertension with blood pressure goal below 130/90, today's reading 130/70  diabetes with hemoglobin A1c goal below 6.5% continue diabetic medications lipids with LDL cholesterol goal below 70 mg/dL. continue Crestor eat a healthy diet with plenty of whole grains, cereals, fruits and vegetables, exercise regularly and maintain ideal body weight  Follow-up in 6 months, if stable at that time will discharge

## 2016-08-18 DIAGNOSIS — J329 Chronic sinusitis, unspecified: Secondary | ICD-10-CM | POA: Diagnosis not present

## 2016-08-18 DIAGNOSIS — J343 Hypertrophy of nasal turbinates: Secondary | ICD-10-CM | POA: Diagnosis not present

## 2016-08-18 DIAGNOSIS — J342 Deviated nasal septum: Secondary | ICD-10-CM | POA: Diagnosis not present

## 2016-08-18 DIAGNOSIS — J328 Other chronic sinusitis: Secondary | ICD-10-CM | POA: Diagnosis not present

## 2016-08-18 DIAGNOSIS — H6123 Impacted cerumen, bilateral: Secondary | ICD-10-CM | POA: Diagnosis not present

## 2016-08-23 NOTE — Progress Notes (Signed)
  Personally participated in and made any corrections needed to history, physical, neuro exam,assessment and plan as stated above and agree with plan.   Antonia Ahern, MD 

## 2016-09-08 DIAGNOSIS — E119 Type 2 diabetes mellitus without complications: Secondary | ICD-10-CM | POA: Diagnosis not present

## 2016-09-08 DIAGNOSIS — B351 Tinea unguium: Secondary | ICD-10-CM | POA: Diagnosis not present

## 2016-09-08 DIAGNOSIS — L84 Corns and callosities: Secondary | ICD-10-CM | POA: Diagnosis not present

## 2016-09-10 ENCOUNTER — Other Ambulatory Visit: Payer: Self-pay | Admitting: Family Medicine

## 2016-09-10 DIAGNOSIS — I10 Essential (primary) hypertension: Secondary | ICD-10-CM

## 2016-09-24 ENCOUNTER — Other Ambulatory Visit: Payer: Self-pay | Admitting: Dermatology

## 2016-09-24 DIAGNOSIS — C44319 Basal cell carcinoma of skin of other parts of face: Secondary | ICD-10-CM | POA: Diagnosis not present

## 2016-10-08 ENCOUNTER — Other Ambulatory Visit: Payer: Self-pay | Admitting: Family Medicine

## 2016-10-08 MED ORDER — ATORVASTATIN CALCIUM 40 MG PO TABS: 40.0000 mg | ORAL_TABLET | Freq: Every day | ORAL | 3 refills | Status: DC

## 2016-10-08 NOTE — Telephone Encounter (Signed)
Change to 40 mg lipitor for formulary preference.   10 mg crestor price 190$  Laroy Apple, MD [facility] 10/08/2016, 12:48 PM

## 2016-10-09 ENCOUNTER — Other Ambulatory Visit: Payer: Self-pay | Admitting: Family Medicine

## 2016-10-13 NOTE — Telephone Encounter (Signed)
lmtcb

## 2016-10-15 NOTE — Telephone Encounter (Signed)
Pt aware of Vit D OTC recommendations

## 2016-10-29 ENCOUNTER — Other Ambulatory Visit: Payer: Self-pay | Admitting: Dermatology

## 2016-10-29 DIAGNOSIS — D044 Carcinoma in situ of skin of scalp and neck: Secondary | ICD-10-CM | POA: Diagnosis not present

## 2016-10-29 DIAGNOSIS — C4492 Squamous cell carcinoma of skin, unspecified: Secondary | ICD-10-CM

## 2016-10-29 DIAGNOSIS — D0422 Carcinoma in situ of skin of left ear and external auricular canal: Secondary | ICD-10-CM | POA: Diagnosis not present

## 2016-10-29 HISTORY — DX: Squamous cell carcinoma of skin, unspecified: C44.92

## 2016-11-05 ENCOUNTER — Ambulatory Visit (INDEPENDENT_AMBULATORY_CARE_PROVIDER_SITE_OTHER): Payer: Medicare Other

## 2016-11-05 ENCOUNTER — Ambulatory Visit (INDEPENDENT_AMBULATORY_CARE_PROVIDER_SITE_OTHER): Payer: Medicare Other | Admitting: Family Medicine

## 2016-11-05 ENCOUNTER — Encounter: Payer: Self-pay | Admitting: Family Medicine

## 2016-11-05 VITALS — BP 146/78 | HR 60 | Temp 95.2°F | Ht 71.0 in | Wt 190.0 lb

## 2016-11-05 DIAGNOSIS — M25561 Pain in right knee: Secondary | ICD-10-CM

## 2016-11-05 DIAGNOSIS — R109 Unspecified abdominal pain: Secondary | ICD-10-CM

## 2016-11-05 DIAGNOSIS — E785 Hyperlipidemia, unspecified: Secondary | ICD-10-CM

## 2016-11-05 DIAGNOSIS — E1159 Type 2 diabetes mellitus with other circulatory complications: Secondary | ICD-10-CM

## 2016-11-05 LAB — BAYER DCA HB A1C WAIVED: HB A1C (BAYER DCA - WAIVED): 6.2 % (ref <7.0)

## 2016-11-05 MED ORDER — DICLOFENAC SODIUM 1 % TD GEL: 2.0000 g | Freq: Four times a day (QID) | TRANSDERMAL | 1 refills | Status: DC

## 2016-11-05 NOTE — Patient Instructions (Addendum)
Great to see you!  Try tylenol 1-2 pills one to 3 times daily ( no more than 6 pills per day)  Try voltaren gel  Come back in 3 months unless you need [place] sooner.   We will call with xray and lab results, you will be called for an orthopedics referral.

## 2016-11-05 NOTE — Progress Notes (Signed)
HPI  Patient presents today to follow-up for diabetes, also right knee pain and bilateral flank pain.  Diabetes Average fasting is 1:30 to 150. Good medication tolerance and compliance.  Right knee pain Started after spending too much time on the tractor about a week ago, anterior knee pain throbbing in nature. Helps minimally by ice. Has not tried any medications. Started a small swelling on the right knee a few years ago which has gradually gotten worse. Has had surgery on his right knee. Has previously had injections from orthopedics.  Flank pain Patient states that several mornings a week he wakes up with bilateral flank pain described as soreness. He states "it feels like my rib cage has fallen down". No medications tried. No injuries  PMH: Smoking status noted ROS: Per HPI  Objective: BP (!) 146/78   Pulse 60   Temp (!) 95.2 F (35.1 C) (Oral)   Ht 5' 11" (1.803 m)   Wt 190 lb (86.2 kg)   BMI 26.50 kg/m  Gen: NAD, alert, cooperative with exam HEENT: NCAT CV: RRR, good S1/S2, no murmur Resp: CTABL, no wheezes, non-labored Ext: No edema, warm Neuro: Alert and oriented, No gross deficits msk:  No tenderness to palpation of bilateral paraspinal muscles in the thoracic or lumbar area, no midline tenderness over the thoracic or lumbar spine  MSK: R knee without erythema, effusion, bruising,  Right lateral knee with approximately 3 cm protrusion with a base approximately 2-3 cm, slightly soft reminiscent of the tophus No joint line tenderness.  ligamentously intact to Lachman's and with varus and valgus stress.  Negative McMurray's test   Assessment and plan:  # Type 2 diabetes Controlled, no changes to medications Foot exam performed  # Right knee pain Likely overuse injury for this acute injury Recommended ice, Tylenol, Voltaren gel The protrusion on the lateral aspect is reminiscent of the tophus, however it's unclear what actual etiology is Refer to  orthopedics, plain film pending  # Bilateral flank pain Unclear etiology Tylenol as needed Reassurance provided  # hyperlipidemia Repeat labs    Orders Placed This Encounter  Procedures  . DG Knee 1-2 Views Right    Standing Status:   Future    Standing Expiration Date:   01/05/2018    Order Specific Question:   Reason for Exam (SYMPTOM  OR DIAGNOSIS REQUIRED)    Answer:   R knee pain and protrusion laterally. Pain is anterior distal to patella    Order Specific Question:   Preferred imaging location?    Answer:   Internal  . Bayer DCA Hb A1c Waived  . CMP14+EGFR  . Lipid panel  . Ambulatory referral to Orthopedic Surgery    Referral Priority:   Routine    Referral Type:   Surgical    Referral Reason:   Specialty Services Required    Requested Specialty:   Orthopedic Surgery    Number of Visits Requested:   1    Meds ordered this encounter  Medications  . diclofenac sodium (VOLTAREN) 1 % GEL    Sig: Apply 2 g topically 4 (four) times daily.    Dispense:  100 g    Refill:  1    Sam Bradshaw, MD [facility] 11/05/2016, 9:17 AM     

## 2016-11-06 LAB — CMP14+EGFR
ALBUMIN: 4.6 g/dL (ref 3.5–4.7)
ALK PHOS: 81 IU/L (ref 39–117)
ALT: 10 IU/L (ref 0–44)
AST: 13 IU/L (ref 0–40)
Albumin/Globulin Ratio: 1.9 (ref 1.2–2.2)
BILIRUBIN TOTAL: 0.6 mg/dL (ref 0.0–1.2)
BUN / CREAT RATIO: 11 (ref 10–24)
BUN: 13 mg/dL (ref 8–27)
CHLORIDE: 93 mmol/L — AB (ref 96–106)
CO2: 26 mmol/L (ref 18–29)
Calcium: 9.6 mg/dL (ref 8.6–10.2)
Creatinine, Ser: 1.19 mg/dL (ref 0.76–1.27)
GFR calc Af Amer: 64 mL/min/{1.73_m2} (ref >59)
GFR calc non Af Amer: 56 mL/min/{1.73_m2} — ABNORMAL LOW (ref >59)
GLOBULIN, TOTAL: 2.4 g/dL (ref 1.5–4.5)
Glucose: 142 mg/dL — ABNORMAL HIGH (ref 65–99)
Potassium: 4.2 mmol/L (ref 3.5–5.2)
SODIUM: 136 mmol/L (ref 134–144)
Total Protein: 7 g/dL (ref 6.0–8.5)

## 2016-11-06 LAB — LIPID PANEL
Chol/HDL Ratio: 2.8 ratio (ref 0.0–5.0)
Cholesterol, Total: 113 mg/dL (ref 100–199)
HDL: 41 mg/dL (ref >39)
LDL Calculated: 49 mg/dL (ref 0–99)
Triglycerides: 117 mg/dL (ref 0–149)
VLDL Cholesterol Cal: 23 mg/dL (ref 5–40)

## 2016-11-16 ENCOUNTER — Other Ambulatory Visit: Payer: Self-pay | Admitting: Family Medicine

## 2016-11-17 NOTE — Telephone Encounter (Signed)
I recommend daily OTC 200 IU vitamin D3 instead of high dose replacement.   High dose replacement used to replace for 12 weeks then maintenance daily.   Laroy Apple, MD [facility] 11/17/2016, 8:59 AM

## 2016-11-17 NOTE — Telephone Encounter (Signed)
Patient aware of recommendations.  

## 2016-11-20 DIAGNOSIS — M1711 Unilateral primary osteoarthritis, right knee: Secondary | ICD-10-CM | POA: Diagnosis not present

## 2016-12-07 DIAGNOSIS — M1711 Unilateral primary osteoarthritis, right knee: Secondary | ICD-10-CM | POA: Diagnosis not present

## 2016-12-09 ENCOUNTER — Telehealth: Payer: Self-pay | Admitting: Cardiology

## 2016-12-09 IMAGING — DX DG CHEST 2V
2 series · 2 of 2 positions shown · non-contrast
Comparison: 10/01/2014.

CLINICAL DATA: 86-year-old with chronic several week history of
cough and chest congestion.

EXAM:
CHEST  2 VIEW

[chest pa]
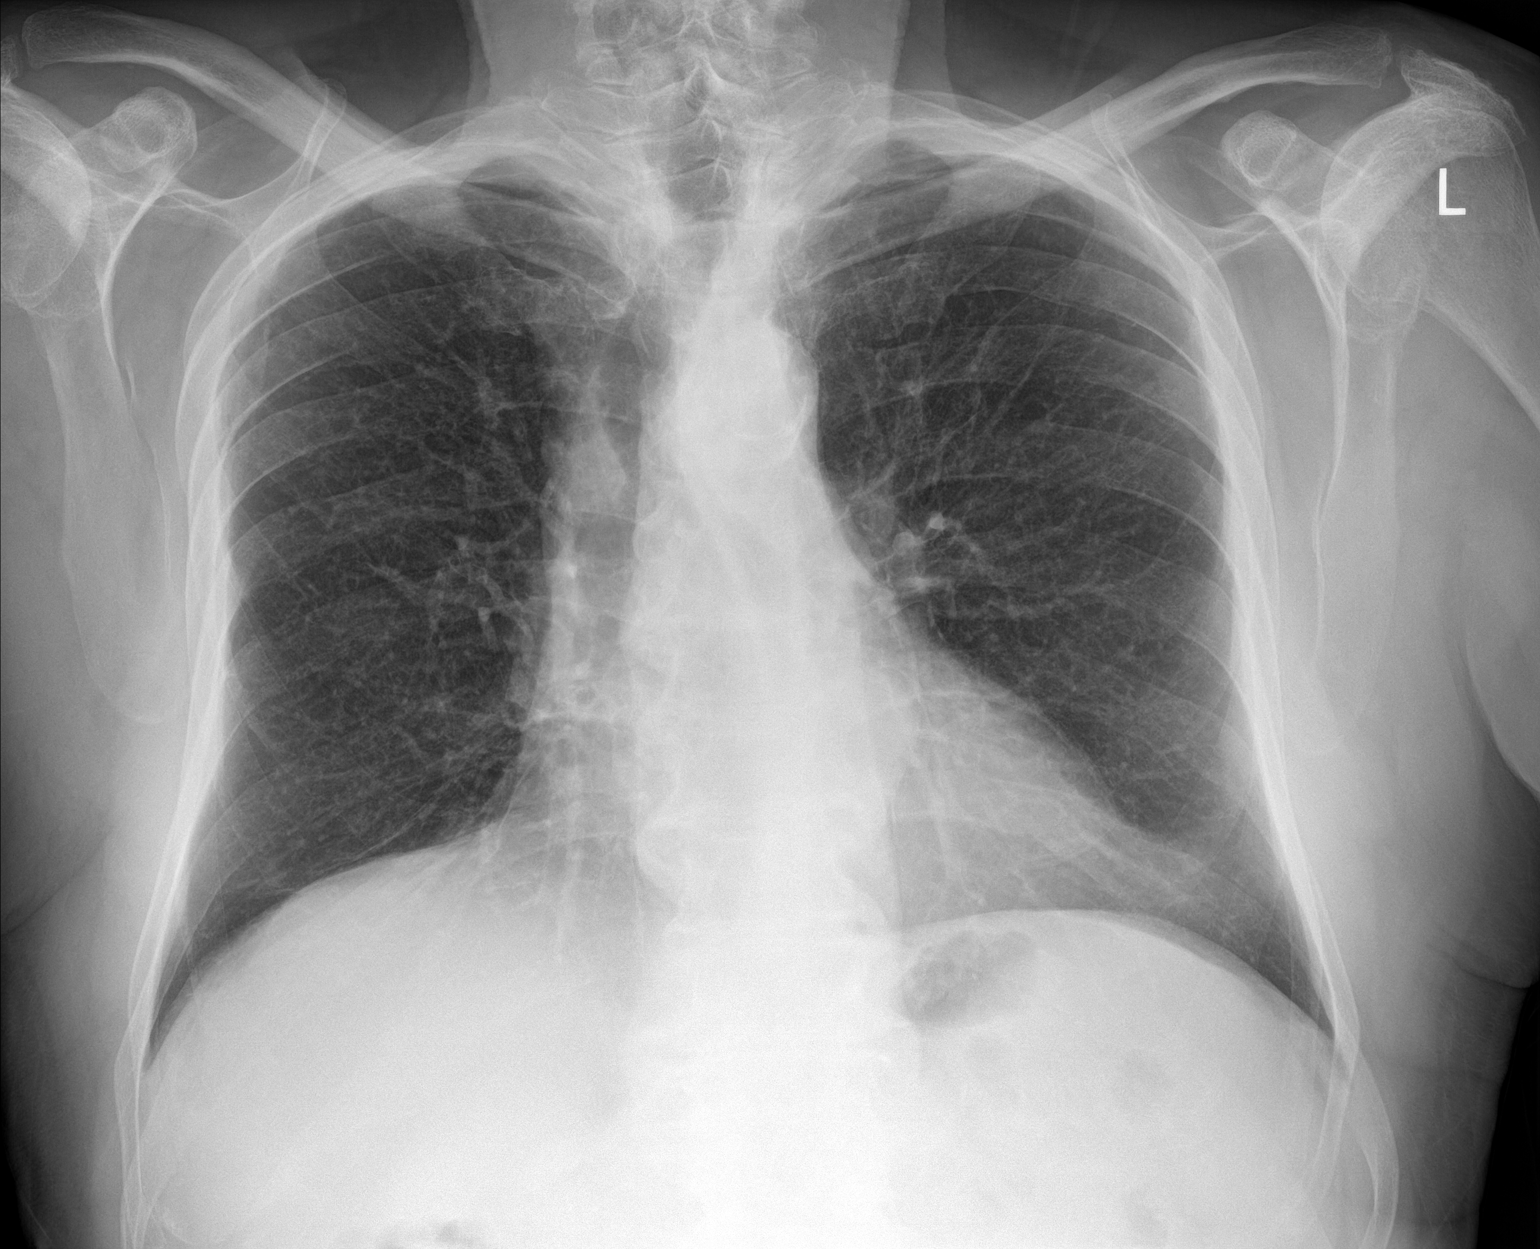

[chest lat]
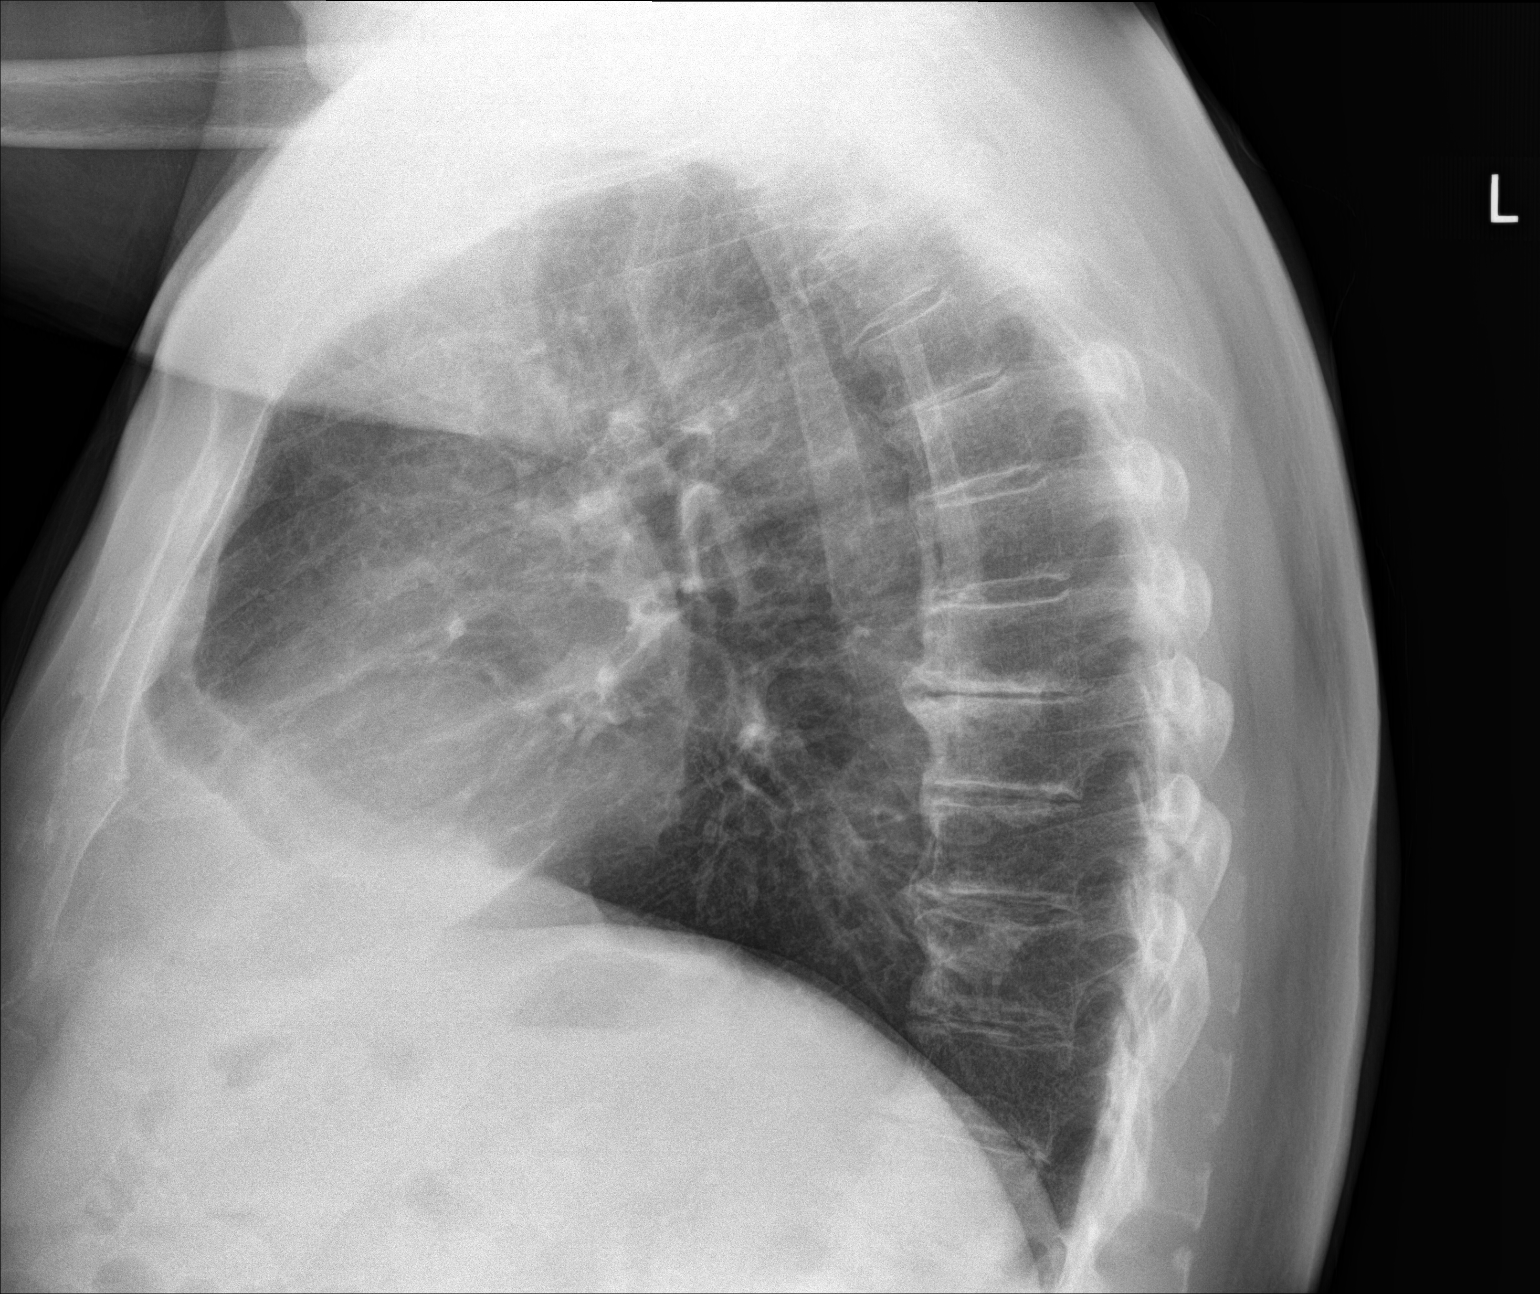

[2 of 2 positions shown; findings below may reference images not displayed]

FINDINGS: Cardiac silhouette normal in size, unchanged. Thoracic aorta
tortuous and atherosclerotic, unchanged. Hilar and mediastinal
contours otherwise unremarkable. Lungs clear. Bronchovascular
markings normal. Pulmonary vascularity normal. No visible pleural
effusions. No pneumothorax. Degenerative changes throughout the
thoracic spine. No significant interval change.
IMPRESSION: No acute cardiopulmonary disease.  Stable examination.

## 2016-12-09 NOTE — Telephone Encounter (Signed)
Per pt's wife call needs to speak to Dr Percival Spanish about knee surg. Please give them a call back.

## 2016-12-09 NOTE — Telephone Encounter (Signed)
Pt reports he just wanted to let us know Dr. Maureen Ralphs would be faxing a letter requesting clearance for knee surgery. Will await receipt of this. No further needs/requests/concerns identified at time of call. Pt aware to call if new needs.

## 2016-12-13 ENCOUNTER — Encounter (HOSPITAL_COMMUNITY): Payer: Self-pay | Admitting: Vascular Surgery

## 2016-12-13 ENCOUNTER — Emergency Department (HOSPITAL_COMMUNITY): Payer: Medicare Other

## 2016-12-13 ENCOUNTER — Inpatient Hospital Stay (HOSPITAL_COMMUNITY)
Admission: EM | Admit: 2016-12-13 | Discharge: 2016-12-15 | DRG: 041 | Disposition: A | Discharge status: Home Health | Payer: Medicare Other | Attending: Internal Medicine | Admitting: Internal Medicine

## 2016-12-13 DIAGNOSIS — Z85828 Personal history of other malignant neoplasm of skin: Secondary | ICD-10-CM | POA: Diagnosis not present

## 2016-12-13 DIAGNOSIS — Z7902 Long term (current) use of antithrombotics/antiplatelets: Secondary | ICD-10-CM

## 2016-12-13 DIAGNOSIS — I340 Nonrheumatic mitral (valve) insufficiency: Secondary | ICD-10-CM | POA: Diagnosis not present

## 2016-12-13 DIAGNOSIS — I252 Old myocardial infarction: Secondary | ICD-10-CM

## 2016-12-13 DIAGNOSIS — N1831 Chronic kidney disease, stage 3a: Secondary | ICD-10-CM | POA: Diagnosis present

## 2016-12-13 DIAGNOSIS — E785 Hyperlipidemia, unspecified: Secondary | ICD-10-CM | POA: Diagnosis present

## 2016-12-13 DIAGNOSIS — Z8673 Personal history of transient ischemic attack (TIA), and cerebral infarction without residual deficits: Secondary | ICD-10-CM

## 2016-12-13 DIAGNOSIS — E876 Hypokalemia: Secondary | ICD-10-CM | POA: Diagnosis not present

## 2016-12-13 DIAGNOSIS — Z9841 Cataract extraction status, right eye: Secondary | ICD-10-CM | POA: Diagnosis not present

## 2016-12-13 DIAGNOSIS — I10 Essential (primary) hypertension: Secondary | ICD-10-CM | POA: Diagnosis not present

## 2016-12-13 DIAGNOSIS — G459 Transient cerebral ischemic attack, unspecified: Secondary | ICD-10-CM

## 2016-12-13 DIAGNOSIS — N183 Chronic kidney disease, stage 3 unspecified: Secondary | ICD-10-CM | POA: Diagnosis present

## 2016-12-13 DIAGNOSIS — Z7984 Long term (current) use of oral hypoglycemic drugs: Secondary | ICD-10-CM

## 2016-12-13 DIAGNOSIS — R29810 Facial weakness: Secondary | ICD-10-CM | POA: Diagnosis not present

## 2016-12-13 DIAGNOSIS — Z87891 Personal history of nicotine dependence: Secondary | ICD-10-CM

## 2016-12-13 DIAGNOSIS — I25110 Atherosclerotic heart disease of native coronary artery with unstable angina pectoris: Secondary | ICD-10-CM | POA: Diagnosis present

## 2016-12-13 DIAGNOSIS — E871 Hypo-osmolality and hyponatremia: Secondary | ICD-10-CM | POA: Diagnosis not present

## 2016-12-13 DIAGNOSIS — E1151 Type 2 diabetes mellitus with diabetic peripheral angiopathy without gangrene: Secondary | ICD-10-CM | POA: Diagnosis not present

## 2016-12-13 DIAGNOSIS — Q211 Atrial septal defect: Secondary | ICD-10-CM

## 2016-12-13 DIAGNOSIS — Z961 Presence of intraocular lens: Secondary | ICD-10-CM | POA: Diagnosis present

## 2016-12-13 DIAGNOSIS — I63411 Cerebral infarction due to embolism of right middle cerebral artery: Principal | ICD-10-CM | POA: Diagnosis present

## 2016-12-13 DIAGNOSIS — E119 Type 2 diabetes mellitus without complications: Secondary | ICD-10-CM

## 2016-12-13 DIAGNOSIS — I129 Hypertensive chronic kidney disease with stage 1 through stage 4 chronic kidney disease, or unspecified chronic kidney disease: Secondary | ICD-10-CM | POA: Diagnosis not present

## 2016-12-13 DIAGNOSIS — Z9842 Cataract extraction status, left eye: Secondary | ICD-10-CM | POA: Diagnosis not present

## 2016-12-13 DIAGNOSIS — R471 Dysarthria and anarthria: Secondary | ICD-10-CM | POA: Diagnosis not present

## 2016-12-13 DIAGNOSIS — E1159 Type 2 diabetes mellitus with other circulatory complications: Secondary | ICD-10-CM | POA: Diagnosis not present

## 2016-12-13 DIAGNOSIS — J449 Chronic obstructive pulmonary disease, unspecified: Secondary | ICD-10-CM | POA: Diagnosis not present

## 2016-12-13 DIAGNOSIS — I639 Cerebral infarction, unspecified: Secondary | ICD-10-CM | POA: Diagnosis not present

## 2016-12-13 DIAGNOSIS — R200 Anesthesia of skin: Secondary | ICD-10-CM | POA: Diagnosis present

## 2016-12-13 DIAGNOSIS — K146 Glossodynia: Secondary | ICD-10-CM | POA: Diagnosis not present

## 2016-12-13 DIAGNOSIS — E1169 Type 2 diabetes mellitus with other specified complication: Secondary | ICD-10-CM

## 2016-12-13 DIAGNOSIS — E1122 Type 2 diabetes mellitus with diabetic chronic kidney disease: Secondary | ICD-10-CM | POA: Diagnosis not present

## 2016-12-13 DIAGNOSIS — E861 Hypovolemia: Secondary | ICD-10-CM | POA: Diagnosis not present

## 2016-12-13 DIAGNOSIS — I2510 Atherosclerotic heart disease of native coronary artery without angina pectoris: Secondary | ICD-10-CM

## 2016-12-13 DIAGNOSIS — Z79899 Other long term (current) drug therapy: Secondary | ICD-10-CM | POA: Diagnosis not present

## 2016-12-13 DIAGNOSIS — R202 Paresthesia of skin: Secondary | ICD-10-CM | POA: Diagnosis not present

## 2016-12-13 DIAGNOSIS — K219 Gastro-esophageal reflux disease without esophagitis: Secondary | ICD-10-CM | POA: Diagnosis present

## 2016-12-13 LAB — I-STAT CHEM 8, ED
BUN: 14 mg/dL (ref 6–20)
CREATININE: 1.3 mg/dL — AB (ref 0.61–1.24)
Calcium, Ion: 1.15 mmol/L (ref 1.15–1.40)
Chloride: 87 mmol/L — ABNORMAL LOW (ref 101–111)
Glucose, Bld: 152 mg/dL — ABNORMAL HIGH (ref 65–99)
HEMATOCRIT: 43 % (ref 39.0–52.0)
HEMOGLOBIN: 14.6 g/dL (ref 13.0–17.0)
Potassium: 3.5 mmol/L (ref 3.5–5.1)
Sodium: 130 mmol/L — ABNORMAL LOW (ref 135–145)
TCO2: 28 mmol/L (ref 0–100)

## 2016-12-13 LAB — RAPID URINE DRUG SCREEN, HOSP PERFORMED
AMPHETAMINES: NOT DETECTED
Barbiturates: NOT DETECTED
Benzodiazepines: NOT DETECTED
Cocaine: NOT DETECTED
OPIATES: NOT DETECTED
TETRAHYDROCANNABINOL: NOT DETECTED

## 2016-12-13 LAB — DIFFERENTIAL
BASOS ABS: 0 10*3/uL (ref 0.0–0.1)
Basophils Relative: 0 %
EOS ABS: 0.1 10*3/uL (ref 0.0–0.7)
EOS PCT: 1 %
Lymphocytes Relative: 24 %
Lymphs Abs: 2 10*3/uL (ref 0.7–4.0)
MONOS PCT: 9 %
Monocytes Absolute: 0.7 10*3/uL (ref 0.1–1.0)
NEUTROS PCT: 66 %
Neutro Abs: 5.5 10*3/uL (ref 1.7–7.7)

## 2016-12-13 LAB — URINALYSIS, ROUTINE W REFLEX MICROSCOPIC
BILIRUBIN URINE: NEGATIVE
Glucose, UA: NEGATIVE mg/dL
HGB URINE DIPSTICK: NEGATIVE
KETONES UR: NEGATIVE mg/dL
Leukocytes, UA: NEGATIVE
NITRITE: NEGATIVE
PH: 6 (ref 5.0–8.0)
Protein, ur: NEGATIVE mg/dL
Specific Gravity, Urine: 1.011 (ref 1.005–1.030)

## 2016-12-13 LAB — CBC
HEMATOCRIT: 41.9 % (ref 39.0–52.0)
HEMOGLOBIN: 14.8 g/dL (ref 13.0–17.0)
MCH: 32.3 pg (ref 26.0–34.0)
MCHC: 35.3 g/dL (ref 30.0–36.0)
MCV: 91.5 fL (ref 78.0–100.0)
Platelets: 201 10*3/uL (ref 150–400)
RBC: 4.58 MIL/uL (ref 4.22–5.81)
RDW: 12.6 % (ref 11.5–15.5)
WBC: 8.3 10*3/uL (ref 4.0–10.5)

## 2016-12-13 LAB — COMPREHENSIVE METABOLIC PANEL
ALK PHOS: 65 U/L (ref 38–126)
ALT: 18 U/L (ref 17–63)
ANION GAP: 11 (ref 5–15)
AST: 28 U/L (ref 15–41)
Albumin: 4.2 g/dL (ref 3.5–5.0)
BILIRUBIN TOTAL: 1.4 mg/dL — AB (ref 0.3–1.2)
BUN: 12 mg/dL (ref 6–20)
CALCIUM: 9.4 mg/dL (ref 8.9–10.3)
CO2: 26 mmol/L (ref 22–32)
Chloride: 90 mmol/L — ABNORMAL LOW (ref 101–111)
Creatinine, Ser: 1.46 mg/dL — ABNORMAL HIGH (ref 0.61–1.24)
GFR calc non Af Amer: 42 mL/min — ABNORMAL LOW (ref >60)
GFR, EST AFRICAN AMERICAN: 49 mL/min — AB (ref >60)
Glucose, Bld: 160 mg/dL — ABNORMAL HIGH (ref 65–99)
Potassium: 3.5 mmol/L (ref 3.5–5.1)
SODIUM: 127 mmol/L — AB (ref 135–145)
TOTAL PROTEIN: 6.9 g/dL (ref 6.5–8.1)

## 2016-12-13 LAB — ETHANOL

## 2016-12-13 LAB — I-STAT TROPONIN, ED: Troponin i, poc: 0 ng/mL (ref 0.00–0.08)

## 2016-12-13 LAB — PROTIME-INR
INR: 1.03
Prothrombin Time: 13.5 seconds (ref 11.4–15.2)

## 2016-12-13 LAB — APTT: aPTT: 29 seconds (ref 24–36)

## 2016-12-13 MED ORDER — MOMETASONE FURO-FORMOTEROL FUM 100-5 MCG/ACT IN AERO
2.0000 | INHALATION_SPRAY | Freq: Two times a day (BID) | RESPIRATORY_TRACT | Status: DC
  Administered 2016-12-14 – 2016-12-15 (×3): 2 via RESPIRATORY_TRACT
  Filled 2016-12-13: qty 8.8

## 2016-12-13 MED ORDER — ACETAMINOPHEN 325 MG PO TABS: 650.0000 mg | ORAL_TABLET | ORAL | Status: DC | PRN

## 2016-12-13 MED ORDER — PANTOPRAZOLE SODIUM 40 MG PO TBEC
40.0000 mg | DELAYED_RELEASE_TABLET | Freq: Every day | ORAL | Status: DC
  Administered 2016-12-14: 40 mg via ORAL
  Filled 2016-12-13 (×2): qty 1

## 2016-12-13 MED ORDER — OMEGA-3-ACID ETHYL ESTERS 1 G PO CAPS
2.0000 g | ORAL_CAPSULE | Freq: Every day | ORAL | Status: DC
  Administered 2016-12-14: 2 g via ORAL
  Filled 2016-12-13 (×2): qty 2

## 2016-12-13 MED ORDER — ALBUTEROL SULFATE (2.5 MG/3ML) 0.083% IN NEBU: 3.0000 mL | INHALATION_SOLUTION | Freq: Four times a day (QID) | RESPIRATORY_TRACT | Status: DC | PRN

## 2016-12-13 MED ORDER — SODIUM CHLORIDE 0.9 % IV SOLN
INTRAVENOUS | Status: DC
  Administered 2016-12-13: 23:00:00 via INTRAVENOUS

## 2016-12-13 MED ORDER — LORAZEPAM 1 MG PO TABS
1.0000 mg | ORAL_TABLET | Freq: Four times a day (QID) | ORAL | Status: DC | PRN
  Administered 2016-12-14: 1 mg via ORAL
  Filled 2016-12-13: qty 1

## 2016-12-13 MED ORDER — ATORVASTATIN CALCIUM 40 MG PO TABS
40.0000 mg | ORAL_TABLET | Freq: Every day | ORAL | Status: DC
  Administered 2016-12-14: 40 mg via ORAL
  Filled 2016-12-13 (×2): qty 1

## 2016-12-13 MED ORDER — STROKE: EARLY STAGES OF RECOVERY BOOK
Freq: Once | Status: AC
  Administered 2016-12-14
  Filled 2016-12-13: qty 1

## 2016-12-13 MED ORDER — ACETAMINOPHEN 650 MG RE SUPP: 650.0000 mg | RECTAL | Status: DC | PRN

## 2016-12-13 MED ORDER — HEPARIN SODIUM (PORCINE) 5000 UNIT/ML IJ SOLN
5000.0000 [IU] | Freq: Three times a day (TID) | INTRAMUSCULAR | Status: DC
  Administered 2016-12-14 (×3): 5000 [IU] via SUBCUTANEOUS
  Filled 2016-12-13 (×3): qty 1

## 2016-12-13 MED ORDER — ASPIRIN 325 MG PO TABS
325.0000 mg | ORAL_TABLET | Freq: Every day | ORAL | Status: DC
  Administered 2016-12-14: 325 mg via ORAL
  Filled 2016-12-13 (×2): qty 1

## 2016-12-13 MED ORDER — GLUCERNA 1.2 CAL PO LIQD
237.0000 mL | Freq: Two times a day (BID) | ORAL | Status: DC
  Filled 2016-12-13 (×2): qty 237

## 2016-12-13 MED ORDER — SENNOSIDES-DOCUSATE SODIUM 8.6-50 MG PO TABS: 1.0000 | ORAL_TABLET | Freq: Every evening | ORAL | Status: DC | PRN

## 2016-12-13 MED ORDER — ACETAMINOPHEN 160 MG/5ML PO SOLN: 650.0000 mg | ORAL | Status: DC | PRN

## 2016-12-13 MED ORDER — CLOPIDOGREL BISULFATE 75 MG PO TABS
75.0000 mg | ORAL_TABLET | Freq: Every day | ORAL | Status: DC
Start: 2016-12-14 — End: 2016-12-15
  Administered 2016-12-14: 75 mg via ORAL
  Filled 2016-12-13 (×2): qty 1

## 2016-12-13 NOTE — ED Triage Notes (Signed)
Pt reports to the ED for eval of left sided tongue numbness and slurred speech x 2-3 minutes today. His wife states that he had a stroke last year with left sided facial droop. He denies any of these symptoms today. He states that on Thursday he had the tongue numbness but it went away after a few minutes and then today it happened 2-3 times and it will resolve. The last episode occurred today around 15:00. He was seen by EMS and they said he would be safe to transport by POV so he came here. Denies any symptoms at this time.

## 2016-12-13 NOTE — ED Notes (Signed)
Taken to CT at this time. 

## 2016-12-13 NOTE — H&P (Signed)
History and Physical    [name] MEQ:[id number] DOB: 1930-02-13 DOA: 12/13/2016  PCP: Timmothy Euler, MD   Patient coming from: Home  Chief Complaint: Transient, recurrent tongue numbness and dysarthria   HPI: [name] is a 83 y.o. male with medical history significant for COPD, hypertension, coronary artery disease status post remote atherectomy, history of CVA, and type 2 diabetes mellitus who now presents to the emergency department for evaluation of transient, recurrent numbness involving the left side of the tongue with dysarthria. The patient reports that he been in his usual state until several days ago when he developed a nonspecific malaise and loss of appetite. He has been eating and drinking very little since that time. On 12/10/2016, patient developed sudden onset of numbness involving the left half of this tongue with dysarthria. This lasted 2-3 minutes and resolved. Symptoms recurred twice today, lasting 2-3 minutes each time, and again associated with dysarthria. Patient denies any recent fall or trauma, denies headache, change in vision or hearing, or focal numbness or weakness aside from the tongue. He denies any chest pain or palpitations. He continues to take Plavix and statin. He denies abdominal pain, nausea, vomiting, or diarrhea, but notes that he has just not "felt quite right," and has had very poor appetite. He denies any choking or cough or dyspnea.  ED Course: Upon arrival to the ED, patient is found to be afebrile, saturating well on room air, and with vital signs stable. EKG features a sinus rhythm with low voltage QRS. Noncontrast head CT is negative for acute intracranial abnormality. Chemistry panel is notable for a sodium of 127 and creatinine 1.46, up from an apparent baseline of 1.2. CBC is unremarkable, troponin is undetectable, UDS is negative, and UA is unremarkable. Ethanol level is undetectable. Neurology was consulted by the ED physician and  advised for medical admission. The patient remained hemodynamically stable and in no apparent respiratory distress and will be admitted to the telemetry unit for ongoing evaluation and management of transient tongue numbness and dysarthria concerning for possible TIA, as well as hyponatremia to 127 in the setting of recent poor appetite with hypovolemia.  Review of Systems:  All other systems reviewed and apart from HPI, are negative.  Past Medical History:  Diagnosis Date  . Arthritis    "wrists, knees, back" (12/10/2015)  . Asthma   . Cerebral atherosclerosis   . Chronic back pain    "lower primarily" (12/10/2015)  . CORONARY ATHEROSCLEROSIS NATIVE CORONARY ARTERY    Myocardial infarction at [facility] in 1996 with a directional atherectomy of the 90% LAD stenosis and medical management of 60-70% proximal circumflex and 50% RCA stenosis.  . DYSLIPIDEMIA   . GERD (gastroesophageal reflux disease)   . History of hiatal hernia   . HYPERTENSION   . ILIAC ARTERY ANEURYSM   . Myocardial infarction ([place]) 1996  . Stroke [facility]) 2003; 2013; 07/2015; 12/05/2015   denies residual on 12/10/2015  . TIA (transient ischemic attack)   . Type II diabetes mellitus ([place])   . UPPER RESPIRATORY INFECTION     Past Surgical History:  Procedure Laterality Date  . ANAL FISSURE REPAIR  1990s  . BASAL CELL CARCINOMA EXCISION Right 12/2015   "temple"  . CATARACT EXTRACTION W/ INTRAOCULAR LENS  IMPLANT, BILATERAL Bilateral 2000s  . CORONARY ANGIOPLASTY  1996  . KNEE CARTILAGE SURGERY Right 1970s  . SHOULDER ARTHROSCOPY W/ ROTATOR CUFF REPAIR Left 2000s     reports that he quit  smoking about 32 years ago. His smoking use included Cigarettes. He has a 5.40 pack-year smoking history. He has quit using smokeless tobacco. His smokeless tobacco use included Snuff. He reports that he does not drink alcohol or use drugs.  Allergies  Allergen Reactions  . Codeine Other (See Comments)    Bad dreams  . Lopid  [Gemfibrozil] Nausea Only  . Voltaren [Diclofenac Sodium] Diarrhea    Family History  Problem Relation Age of Onset  . Coronary artery disease Mother   . Cancer Father        lung  . Stroke Father   . Cancer Brother        lung  . Stroke Sister      Prior to Admission medications   Medication Sig Start Date End Date Taking? Authorizing Provider  acetaminophen (TYLENOL) 325 MG tablet Take 650 mg by mouth every 6 (six) hours as needed for mild pain.   Yes [provider]  ADVAIR DISKUS 100-50 MCG/DOSE AEPB INHALE ONE DOSE BY MOUTH EVERY 12 HOURS AS NEEDED 06/16/16  Yes Timmothy Euler, MD  atorvastatin (LIPITOR) 40 MG tablet Take 1 tablet (40 mg total) by mouth daily. 10/08/16  Yes Timmothy Euler, MD  benazepril (LOTENSIN) 20 MG tablet TAKE ONE TABLET BY MOUTH ONCE DAILY 09/11/16  Yes Timmothy Euler, MD  clopidogrel (PLAVIX) 75 MG tablet TAKE ONE TABLET BY MOUTH ONCE DAILY 10/12/16  Yes Timmothy Euler, MD  DIALYVITE VITAMIN D3 MAX 62836 units TABS TAKE ONE TABLET BY MOUTH ONCE A WEEK 09/11/16  Yes Timmothy Euler, MD  diclofenac sodium (VOLTAREN) 1 % GEL Apply 2 g topically 4 (four) times daily. 11/05/16  Yes Timmothy Euler, MD  feeding supplement, GLUCERNA SHAKE, (GLUCERNA SHAKE) LIQD Take 237 mLs by mouth 2 (two) times daily between meals. 12/13/15  Yes Timmothy Euler, MD  fish oil-omega-3 fatty acids 1000 MG capsule Take 2 g by mouth daily.     Yes [provider]  glucose blood test strip Check blood sugar twice daily and as needed. Dx Code 12/18/15  Yes Timmothy Euler, MD  hydrALAZINE (APRESOLINE) 25 MG tablet Take 1 tablet (25 mg total) by mouth daily as needed. 05/27/16 12/13/16 Yes Minus Breeding, MD  hydrochlorothiazide (HYDRODIURIL) 25 MG tablet TAKE ONE-HALF TABLET BY MOUTH ONCE DAILY EXCEPT ON MONDAY, [facility] AND FRIDAY TAKE A WHOLE TABLET 03/23/16  Yes Chipper Herb, MD  metFORMIN (GLUCOPHAGE-XR) 500 MG 24 hr tablet TAKE ONE TABLET BY  MOUTH TWICE DAILY 03/23/16  Yes Chipper Herb, MD  metoprolol (LOPRESSOR) 100 MG tablet TAKE ONE TABLET BY MOUTH TWICE DAILY 03/23/16  Yes Chipper Herb, MD  nitroGLYCERIN (NITROSTAT) 0.4 MG SL tablet Place 0.4 mg under the tongue every 5 (five) minutes as needed for chest pain.   Yes [provider]  pantoprazole (PROTONIX) 40 MG tablet Take 1 tablet (40 mg total) by mouth daily. 02/25/16  Yes Timmothy Euler, MD  PROAIR HFA 108 [phone] Base) MCG/ACT inhaler INHALE TWO PUFFS BY MOUTH 4 TIMES DAILY AS NEEDED 09/11/16  Yes Timmothy Euler, MD  rosuvastatin (CRESTOR) 10 MG tablet Take 10 mg by mouth daily. 10/13/16  Yes [provider]    Physical Exam: Vitals:   12/13/16 2030 12/13/16 2100 12/13/16 2130 12/13/16 2200  BP: 121/68 (!) 145/85 (!) 147/69 (!) 141/80  Pulse: 64 67 65 63  Resp: 17 17 18 15   Temp:      TempSrc:  SpO2: 94% 94% 94% 93%      Constitutional: NAD, calm, comfortable Eyes: PERTLA, lids and conjunctivae normal ENMT: Mucous membranes are moist. Posterior pharynx clear of any exudate or lesions.   Neck: normal, supple, no masses, no thyromegaly Respiratory: clear to auscultation bilaterally, no wheezing, no crackles. Normal respiratory effort.  Cardiovascular: S1 & S2 heard, regular rate and rhythm. No extremity edema. No significant JVD. Abdomen: No distension, no tenderness, no masses palpated. Bowel sounds normal.  Musculoskeletal: no clubbing / cyanosis. No joint deformity upper and lower extremities.  Skin: no significant rashes, lesions, ulcers. Warm, dry, well-perfused. Poor turgor. Neurologic: CN 2-12 grossly intact. Sensation intact, DTR normal. Strength 5/5 in all 4 limbs.  Psychiatric: Alert and oriented x 3. Calm and cooperative.     Labs on Admission: I have personally reviewed following labs and imaging studies  CBC:  Recent Labs Lab 12/13/16 1732 12/13/16 1738  WBC 8.3  --   NEUTROABS 5.5  --   HGB 14.8 14.6  HCT 41.9  43.0  MCV 91.5  --   PLT 201  --    Basic Metabolic Panel:  Recent Labs Lab 12/13/16 1732 12/13/16 1738  NA 127* 130*  K 3.5 3.5  CL 90* 87*  CO2 26  --   GLUCOSE 160* 152*  BUN 12 14  CREATININE 1.46* 1.30*  CALCIUM 9.4  --    GFR: CrCl cannot be calculated (Unknown ideal weight.). Liver Function Tests:  Recent Labs Lab 12/13/16 1732  AST 28  ALT 18  ALKPHOS 65  BILITOT 1.4*  PROT 6.9  ALBUMIN 4.2   No results for input(s): LIPASE, AMYLASE in the last 168 hours. No results for input(s): AMMONIA in the last 168 hours. Coagulation Profile:  Recent Labs Lab 12/13/16 1732  INR 1.03   Cardiac Enzymes: No results for input(s): CKTOTAL, CKMB, CKMBINDEX, TROPONINI in the last 168 hours. BNP (last 3 results) No results for input(s): PROBNP in the last 8760 hours. HbA1C: No results for input(s): HGBA1C in the last 72 hours. CBG: No results for input(s): GLUCAP in the last 168 hours. Lipid Profile: No results for input(s): CHOL, HDL, LDLCALC, TRIG, CHOLHDL, LDLDIRECT in the last 72 hours. Thyroid Function Tests: No results for input(s): TSH, T4TOTAL, FREET4, T3FREE, THYROIDAB in the last 72 hours. Anemia Panel: No results for input(s): VITAMINB12, FOLATE, FERRITIN, TIBC, IRON, RETICCTPCT in the last 72 hours. Urine analysis:    Component Value Date/Time   COLORURINE YELLOW 12/13/2016 2045   APPEARANCEUR CLEAR 12/13/2016 2045   LABSPEC 1.011 12/13/2016 2045   PHURINE 6.0 12/13/2016 2045   GLUCOSEU NEGATIVE 12/13/2016 2045   HGBUR NEGATIVE 12/13/2016 2045   BILIRUBINUR NEGATIVE 12/13/2016 2045   BILIRUBINUR neg 05/02/2014 0956   KETONESUR NEGATIVE 12/13/2016 2045   PROTEINUR NEGATIVE 12/13/2016 2045   UROBILINOGEN negative 05/02/2014 0956   NITRITE NEGATIVE 12/13/2016 2045   LEUKOCYTESUR NEGATIVE 12/13/2016 2045   Sepsis Labs: @LABRCNTIP (procalcitonin:4,lacticidven:4) )No results found for this or any previous visit (from the past 240 hour(s)).    Radiological Exams on Admission: Ct Head Wo Contrast  Result Date: 12/13/2016 CLINICAL DATA:  Left tongue numbness. EXAM: CT HEAD WITHOUT CONTRAST TECHNIQUE: Contiguous axial images were obtained from the base of the skull through the vertex without intravenous contrast. COMPARISON:  Head CT and brain MRI 12/10/2015 FINDINGS: Brain: Stable degree of atrophy and chronic small vessel ischemia. No evidence of acute infarct or large vessel ischemia. No mass effect, midline shift, hydrocephalus or extra-axial fluid  collection. Vascular: Atherosclerosis of skullbase vasculature without hyperdense vessel or abnormal calcification. Skull: Normal. Negative for fracture or focal lesion. Sinuses/Orbits: Paranasal sinuses and mastoid air cells are clear. The visualized orbits are unremarkable. Bilateral cataract resection. Other: None. IMPRESSION: Similar degree of atrophy and chronic small vessel ischemia. No evidence of acute intracranial abnormality. Electronically Signed   By: Jeb Levering M.D.   On: 12/13/2016 21:09    EKG: Independently reviewed. Sinus rhythm, low-voltage QRS.   Assessment/Plan  1. Transient tongue numbness and dysarthria  - Pt presents after 3 episodes of left tongue numbness and dysarthria, each lasting ~2-3 minutes - Head CT is negative for acute intracranial abnormality and no focal neurologic deficits appreciated on admission  - Neurology is consulting and much appreciated; will follow-up on recommendations - He passed bedside swallow eval in ED  - Plan to admit to telemetry with frequent neuro checks, PT/OT/SLP evals  - Obtain MRI brain, MRA head, carotid dopplers, echo, fasting lipid panel, and A1c - Continue Plavix and statin, follow-up neuro rec's regarding additional antiplatelet   2. Hyponatremia   - Serum sodium is 127 on admission, previously normal  - He reports poor appetite for several days and is hypovolemic   - Start NS infusion, check chem panel overnight  to avoid too rapid a correction, goal is 133-135 by night of 5/14  3. Hx of CVA - No deficits appreciated, head CT negative for acute abnormality  - Eval for acute TIA/CVA as above   - Continue Plavix and statin    4. CAD  - Status-post remote LAD atherectomy after MI at [facility]  - No anginal complaint, EKG without acute ischemic features, troponin <0.03  - Continue Plavix and Lipitor; hold benazepril and Lopressor in acute phase of possible CVA pending MRI    5. Hypertension  - BP slightly elevated  - Managed at home with hydralazine, benazepril, HCTZ, and Lopressor  - Antihypertensives held initially while evaluating for acute CVA/TIA   6. Type II DM  - A1c was 6.1% in May 2017  - Managed with metformin only at home; held  - Check CBG with meals and qHS  - Start a low-intensity Novolog correctional   7. CKD stage III  - SCr is 1.46, slightly up from apparent baseline of ~1.2  - He is being hydrated with NS as above and ACEi is held  - Repeat chem panel pending   8. Asthma, persistent - Stable; no distress, cough, or wheeze - Continue ICS/LABA with Dulera, continue prn albuterol    DVT prophylaxis: sq heparin  Code Status: Full  Family Communication: Son, daughter, and wife updated at bedside with patient's permission   Disposition Plan: Admit to telemetry Consults called: Neurology Admission status: Inpatient    Vianne Bulls, MD [facility] Pager [phone]  If 7PM-7AM, please contact night-coverage [email or web address] Password [facility]  12/13/2016, 10:59 PM

## 2016-12-13 NOTE — ED Notes (Signed)
Attempted report.  Nurse to call back when available. 

## 2016-12-13 NOTE — Consult Note (Signed)
Neurology Consultation Reason for Consult: Transient dysarthria and left-sided facial droop Referring Physician: Jeanell Sparrow, D  CC: Transient dysarthria and left-sided facial droop  History is obtained from: Patient  HPI: [name] is a 83 y.o. male who reports that he experienced was transient left tongue numbness. His family who were with him noticed that his left face drooped and his speech became thick. This has happened twice today. It is currently completely back to baseline. It happened again last Thursday.  He has a history of multiple strokes, but has not had one since at least last year.   LKW: Thursday tpa given?: no, resolution of symptoms    ROS: A 14 point ROS was performed and is negative except as noted in the HPI.   Past Medical History:  Diagnosis Date  . Arthritis    "wrists, knees, back" (12/10/2015)  . Asthma   . Cerebral atherosclerosis   . Chronic back pain    "lower primarily" (12/10/2015)  . CORONARY ATHEROSCLEROSIS NATIVE CORONARY ARTERY    Myocardial infarction at [facility] in 1996 with a directional atherectomy of the 90% LAD stenosis and medical management of 60-70% proximal circumflex and 50% RCA stenosis.  . DYSLIPIDEMIA   . GERD (gastroesophageal reflux disease)   . History of hiatal hernia   . HYPERTENSION   . ILIAC ARTERY ANEURYSM   . Myocardial infarction ([place]) 1996  . Stroke [facility]) 2003; 2013; 07/2015; 12/05/2015   denies residual on 12/10/2015  . TIA (transient ischemic attack)   . Type II diabetes mellitus ([place])   . UPPER RESPIRATORY INFECTION      Family History  Problem Relation Age of Onset  . Coronary artery disease Mother   . Cancer Father        lung  . Stroke Father   . Cancer Brother        lung  . Stroke Sister      Social History:  reports that he quit smoking about 32 years ago. His smoking use included Cigarettes. He has a 5.40 pack-year smoking history. He has quit using smokeless tobacco. His smokeless tobacco use  included Snuff. He reports that he does not drink alcohol or use drugs.   Exam: Current vital signs: BP 118/80   Pulse 69   Temp 98.8 F (37.1 C) (Oral)   Resp 19   SpO2 93%  Vital signs in last 24 hours: Temp:  [97.4 F (36.3 C)-98.8 F (37.1 C)] 98.8 F (37.1 C) (05/13 2327) Pulse Rate:  [58-77] 69 (05/13 2315) Resp:  [15-19] 19 (05/13 2315) BP: (113-161)/(68-89) 118/80 (05/13 2315) SpO2:  [93 %-96 %] 93 % (05/13 2315)   Physical Exam  Constitutional: Appears well-developed and well-nourished.  Psych: Affect appropriate to situation Eyes: No scleral injection HENT: No OP obstrucion Head: Normocephalic.  Cardiovascular: Normal rate and regular rhythm.  Respiratory: Effort normal and breath sounds normal to anterior ascultation GI: Soft.  No distension. There is no tenderness.  Skin: WDI  Neuro: Mental Status: Patient is awake, alert, oriented to person, place, month, year, and situation. Patient is able to give a clear and coherent history. No signs of aphasia or neglect Cranial Nerves: II: Visual Fields are full. Pupils are equal, round, and reactive to light.   III,IV, VI: EOMI without ptosis or diploplia.  V: Facial sensation is symmetric to temperature VII: Facial movement is symmetric.  VIII: hearing is intact to voice X: Uvula elevates symmetrically XI: Shoulder shrug is symmetric. XII: tongue is  midline without atrophy or fasciculations.  Motor: Tone is normal. Bulk is normal. 5/5 strength was present in all four extremities.  Sensory: Sensation is symmetric to light touch and temperature in the arms and legs. Deep Tendon Reflexes: 2+ and symmetric in the biceps and patellae.  Plantars: Toes are downgoing bilaterally.  Cerebellar: FNF with mild tremor bilaterally   I have reviewed labs in epic and the results pertinent to this consultation are: CMP-creatinine 1.4  I have reviewed the images obtained: CT head-unremarkable  Impression: 83 year old  male with transient facial droop and dysarthria. The fact that this happened 3 times makes me suspect that he likely has stuttering small vessel TIA. Given the stuttering nature, I would favor dull and the platelet therapy, he was on Plavix prior to admission.  Recommendations: 1. HgbA1c, fasting lipid panel 2. MRI, MRA  of the brain without contrast, MRA neck w/ contrast 3. Frequent neuro checks 4. Echocardiogram 5. Carotid dopplers are not needed given MRA neck 6. Prophylactic therapy-Antiplatelet med: Aspirin - dose 325mg  PO or 300mg  PR plus Plavix 75 mg daily 7. Risk factor modification 8. Telemetry monitoring 9. PT consult, OT consult, Speech consult 10. please page stroke NP  Or  PA  Or MD  from 8am -4 pm as this patient will be followed by the stroke team at this point.   You can look them up on [email or web address]      Roland Rack, MD [facility] [phone]  If 7pm- 7am, please page neurology on call as listed in [place].

## 2016-12-13 NOTE — ED Provider Notes (Addendum)
[place] DEPT Provider Note   CSN: [id number] Arrival date & time: 12/13/16  1658     History   Chief Complaint Chief Complaint  Patient presents with  . Numbness    HPI [name] is a 83 y.o. male.  HPI  83 year old man history of multiple strokes presents today with episodes of left-sided tongue numbness, difficulty speaking, and facial droop. He last had an episode 1-2 hours prior to evaluation. It lasted 3-5 minutes and has completely resolved. He denies any headache or head injury. He is not currently on any blood thinners. The change in his ability to walk or see. Denies chest pain, dyspnea, or abdominal pain.  Past Medical History:  Diagnosis Date  . Arthritis    "wrists, knees, back" (12/10/2015)  . Asthma   . Cerebral atherosclerosis   . Chronic back pain    "lower primarily" (12/10/2015)  . CORONARY ATHEROSCLEROSIS NATIVE CORONARY ARTERY    Myocardial infarction at [facility] in 1996 with a directional atherectomy of the 90% LAD stenosis and medical management of 60-70% proximal circumflex and 50% RCA stenosis.  . DYSLIPIDEMIA   . GERD (gastroesophageal reflux disease)   . History of hiatal hernia   . HYPERTENSION   . ILIAC ARTERY ANEURYSM   . Myocardial infarction ([place]) 1996  . Stroke [facility]) 2003; 2013; 07/2015; 12/05/2015   denies residual on 12/10/2015  . TIA (transient ischemic attack)   . Type II diabetes mellitus ([place])   . UPPER RESPIRATORY INFECTION     Patient Active Problem List   Diagnosis Date Noted  . GERD (gastroesophageal reflux disease) 02/25/2016  . Ischemic cerebrovascular accident (CVA) of frontal lobe ([place]) 12/15/2015  . Malnutrition of moderate degree 12/11/2015  . DM type 2 (diabetes mellitus, type 2) ([place]) 12/06/2015  . CKD (chronic kidney disease) stage 3, GFR 30-59 ml/min 07/17/2015  . Cerebral infarction ([place]) 07/15/2015  . Metabolic syndrome 82/50/5397  . Vitamin D deficiency 08/01/2013  . PVD (peripheral vascular disease)  ([place]) 11/25/2011  . ILIAC ARTERY ANEURYSM 07/17/2010  . Hyperlipidemia LDL goal <70 08/28/2009  . CORONARY ATHEROSCLEROSIS NATIVE CORONARY ARTERY 08/28/2009  . Essential hypertension 06/05/2009  . Cerebral atherosclerosis 06/04/2009    Past Surgical History:  Procedure Laterality Date  . ANAL FISSURE REPAIR  1990s  . BASAL CELL CARCINOMA EXCISION Right 12/2015   "temple"  . CATARACT EXTRACTION W/ INTRAOCULAR LENS  IMPLANT, BILATERAL Bilateral 2000s  . CORONARY ANGIOPLASTY  1996  . KNEE CARTILAGE SURGERY Right 1970s  . SHOULDER ARTHROSCOPY W/ ROTATOR CUFF REPAIR Left 2000s       Home Medications    Prior to Admission medications   Medication Sig Start Date End Date Taking? Authorizing Provider  acetaminophen (TYLENOL) 325 MG tablet Take 650 mg by mouth every 6 (six) hours as needed for mild pain.   Yes [provider]  ADVAIR DISKUS 100-50 MCG/DOSE AEPB INHALE ONE DOSE BY MOUTH EVERY 12 HOURS AS NEEDED 06/16/16  Yes Timmothy Euler, MD  atorvastatin (LIPITOR) 40 MG tablet Take 1 tablet (40 mg total) by mouth daily. 10/08/16  Yes Timmothy Euler, MD  benazepril (LOTENSIN) 20 MG tablet TAKE ONE TABLET BY MOUTH ONCE DAILY 09/11/16  Yes Timmothy Euler, MD  clopidogrel (PLAVIX) 75 MG tablet TAKE ONE TABLET BY MOUTH ONCE DAILY 10/12/16  Yes Timmothy Euler, MD  DIALYVITE VITAMIN D3 MAX 67341 units TABS TAKE ONE TABLET BY MOUTH ONCE A WEEK 09/11/16  Yes Timmothy Euler, MD  diclofenac sodium (VOLTAREN) 1 % GEL Apply 2 g topically 4 (four) times daily. 11/05/16  Yes Timmothy Euler, MD  feeding supplement, GLUCERNA SHAKE, (GLUCERNA SHAKE) LIQD Take 237 mLs by mouth 2 (two) times daily between meals. 12/13/15  Yes Timmothy Euler, MD  fish oil-omega-3 fatty acids 1000 MG capsule Take 2 g by mouth daily.     Yes [provider]  glucose blood test strip Check blood sugar twice daily and as needed. Dx Code 12/18/15  Yes Timmothy Euler, MD  hydrALAZINE  (APRESOLINE) 25 MG tablet Take 1 tablet (25 mg total) by mouth daily as needed. 05/27/16 12/13/16 Yes Minus Breeding, MD  hydrochlorothiazide (HYDRODIURIL) 25 MG tablet TAKE ONE-HALF TABLET BY MOUTH ONCE DAILY EXCEPT ON MONDAY, [facility] AND FRIDAY TAKE A WHOLE TABLET 03/23/16  Yes Chipper Herb, MD  metFORMIN (GLUCOPHAGE-XR) 500 MG 24 hr tablet TAKE ONE TABLET BY MOUTH TWICE DAILY 03/23/16  Yes Chipper Herb, MD  metoprolol (LOPRESSOR) 100 MG tablet TAKE ONE TABLET BY MOUTH TWICE DAILY 03/23/16  Yes Chipper Herb, MD  nitroGLYCERIN (NITROSTAT) 0.4 MG SL tablet Place 0.4 mg under the tongue every 5 (five) minutes as needed for chest pain.   Yes [provider]  pantoprazole (PROTONIX) 40 MG tablet Take 1 tablet (40 mg total) by mouth daily. 02/25/16  Yes Timmothy Euler, MD  PROAIR HFA 108 [phone] Base) MCG/ACT inhaler INHALE TWO PUFFS BY MOUTH 4 TIMES DAILY AS NEEDED 09/11/16  Yes Timmothy Euler, MD  rosuvastatin (CRESTOR) 10 MG tablet Take 10 mg by mouth daily. 10/13/16  Yes [provider]    Family History Family History  Problem Relation Age of Onset  . Coronary artery disease Mother   . Cancer Father        lung  . Stroke Father   . Cancer Brother        lung  . Stroke Sister     Social History Social History  Substance Use Topics  . Smoking status: Former Smoker    Packs/day: 0.12    Years: 45.00    Types: Cigarettes    Quit date: 11/24/1984  . Smokeless tobacco: Former Systems developer    Types: Snuff     Comment: "dipped snuff when I was a boy"  . Alcohol use No     Allergies   Codeine; Lopid [gemfibrozil]; and Voltaren [diclofenac sodium]   Review of Systems Review of Systems  All other systems reviewed and are negative.    Physical Exam Updated Vital Signs BP (!) 152/78   Pulse 71   Temp 97.8 F (36.6 C)   Resp 18   SpO2 95%   Physical Exam  Constitutional: He is oriented to person, place, and time. He appears well-developed and  well-nourished.  HENT:  Head: Normocephalic and atraumatic.  Right Ear: External ear normal.  Left Ear: External ear normal.  Eyes: EOM are normal. Pupils are equal, round, and reactive to light.  Neck: Normal range of motion. Neck supple.  Cardiovascular: Normal rate and regular rhythm.   Pulmonary/Chest: Effort normal.  Abdominal: Soft. Bowel sounds are normal.  Musculoskeletal: Normal range of motion.  Neurological: He is alert and oriented to person, place, and time. He displays normal reflexes. No cranial nerve deficit or sensory deficit. He exhibits normal muscle tone. Coordination normal.  Skin: Skin is warm. Capillary refill takes less than 2 seconds.  Psychiatric: He has a normal mood and affect.  Nursing note and  vitals reviewed.    ED Treatments / Results  Labs (all labs ordered are listed, but only abnormal results are displayed) Labs Reviewed  COMPREHENSIVE METABOLIC PANEL - Abnormal; Notable for the following:       Result Value   Sodium 127 (*)    Chloride 90 (*)    Glucose, Bld 160 (*)    Creatinine, Ser 1.46 (*)    Total Bilirubin 1.4 (*)    GFR calc non Af Amer 42 (*)    GFR calc Af Amer 49 (*)    All other components within normal limits  I-STAT CHEM 8, ED - Abnormal; Notable for the following:    Sodium 130 (*)    Chloride 87 (*)    Creatinine, Ser 1.30 (*)    Glucose, Bld 152 (*)    All other components within normal limits  ETHANOL  PROTIME-INR  APTT  CBC  URINALYSIS, ROUTINE W REFLEX MICROSCOPIC  DIFFERENTIAL  RAPID URINE DRUG SCREEN, HOSP PERFORMED  I-STAT TROPOININ, ED    EKG  EKG Interpretation  Date/Time:  Sunday Dec 13 2016 17:29:57 EDT Ventricular Rate:  75 PR Interval:    QRS Duration: 89 QT Interval:  387 QTC Calculation: 433 R Axis:   -31 Text Interpretation:  Sinus rhythm Inferior infarct, age indeterminate No significant change since last tracing Confirmed by Quentina Fronek MD, Caydyn Sprung ([id number]) on 12/13/2016 7:30:56 PM        Radiology Ct Head Wo Contrast  Result Date: 12/13/2016 CLINICAL DATA:  Left tongue numbness. EXAM: CT HEAD WITHOUT CONTRAST TECHNIQUE: Contiguous axial images were obtained from the base of the skull through the vertex without intravenous contrast. COMPARISON:  Head CT and brain MRI 12/10/2015 FINDINGS: Brain: Stable degree of atrophy and chronic small vessel ischemia. No evidence of acute infarct or large vessel ischemia. No mass effect, midline shift, hydrocephalus or extra-axial fluid collection. Vascular: Atherosclerosis of skullbase vasculature without hyperdense vessel or abnormal calcification. Skull: Normal. Negative for fracture or focal lesion. Sinuses/Orbits: Paranasal sinuses and mastoid air cells are clear. The visualized orbits are unremarkable. Bilateral cataract resection. Other: None. IMPRESSION: Similar degree of atrophy and chronic small vessel ischemia. No evidence of acute intracranial abnormality. Electronically Signed   By: Melanie  Ehinger M.D.   On: 12/13/2016 21:09    Procedures Procedures (including critical care time)  Medications Ordered in ED Medications - No data to display   Initial Impression / Assessment and Plan / ED Course  I have reviewed the triage vital signs and the nursing notes.  Pertinent labs & imaging results that were available during my care of the patient were reviewed by me and considered in my medical decision making (see chart for details).     83  year old man history of stroke presents today with symptoms consistent with TIA. Has been neurologically and hemodynamically stable here. CT without any evidence of acute ischemia. He has some hyponatremia with sodium at 1:30 with last at 136. Patient with history of diabetes and blood sugar here is 152  ABCD2 score 4 with known ho stroke- plan admission for tia evaluation.  Discussed with Dr.Kirkpatrick and he will see in consult  Final Clinical Impressions(s) / ED Diagnoses   Final  diagnoses:  TIA (transient ischemic attack)  Stroke (cerebrum) ([place])  Transient cerebral ischemia, unspecified type    New Prescriptions New Prescriptions   No medications on file     Pattricia Boss, MD 12/20/16 0086    Pattricia Boss, MD 12/20/16 [phone]

## 2016-12-13 NOTE — ED Notes (Signed)
EKG given to Dr Ray

## 2016-12-14 ENCOUNTER — Inpatient Hospital Stay (HOSPITAL_COMMUNITY): Payer: Medicare Other

## 2016-12-14 DIAGNOSIS — G459 Transient cerebral ischemic attack, unspecified: Secondary | ICD-10-CM

## 2016-12-14 LAB — LIPID PANEL
Cholesterol: 85 mg/dL (ref 0–200)
HDL: 30 mg/dL — ABNORMAL LOW (ref >40)
LDL CALC: 27 mg/dL (ref 0–99)
Total CHOL/HDL Ratio: 2.8 RATIO
Triglycerides: 139 mg/dL (ref <150)
VLDL: 28 mg/dL (ref 0–40)

## 2016-12-14 LAB — ECHOCARDIOGRAM COMPLETE

## 2016-12-14 LAB — BASIC METABOLIC PANEL
Anion gap: 10 (ref 5–15)
BUN: 11 mg/dL (ref 6–20)
CALCIUM: 8.8 mg/dL — AB (ref 8.9–10.3)
CHLORIDE: 94 mmol/L — AB (ref 101–111)
CO2: 26 mmol/L (ref 22–32)
CREATININE: 1.09 mg/dL (ref 0.61–1.24)
GFR calc non Af Amer: >60 mL/min (ref >60)
GLUCOSE: 128 mg/dL — AB (ref 65–99)
Potassium: 3.1 mmol/L — ABNORMAL LOW (ref 3.5–5.1)
Sodium: 130 mmol/L — ABNORMAL LOW (ref 135–145)

## 2016-12-14 LAB — MAGNESIUM: MAGNESIUM: 1.6 mg/dL — AB (ref 1.7–2.4)

## 2016-12-14 LAB — SODIUM, URINE, RANDOM: SODIUM UR: 34 mmol/L

## 2016-12-14 LAB — OSMOLALITY, URINE: Osmolality, Ur: 373 mOsm/kg (ref 300–900)

## 2016-12-14 MED ORDER — POTASSIUM CHLORIDE CRYS ER 20 MEQ PO TBCR
40.0000 meq | EXTENDED_RELEASE_TABLET | Freq: Once | ORAL | Status: AC
  Administered 2016-12-14: 40 meq via ORAL
  Filled 2016-12-14: qty 2

## 2016-12-14 MED ORDER — GLUCERNA SHAKE PO LIQD
237.0000 mL | Freq: Three times a day (TID) | ORAL | Status: DC
  Administered 2016-12-14 (×2): 237 mL via ORAL
  Filled 2016-12-14 (×3): qty 237

## 2016-12-14 MED ORDER — GADOBENATE DIMEGLUMINE 529 MG/ML IV SOLN: 8.0000 mL | Freq: Once | INTRAVENOUS | Status: DC | PRN

## 2016-12-14 MED ORDER — MAGNESIUM SULFATE 2 GM/50ML IV SOLN
2.0000 g | Freq: Once | INTRAVENOUS | Status: AC
  Administered 2016-12-14: 2 g via INTRAVENOUS
  Filled 2016-12-14: qty 50

## 2016-12-14 NOTE — Evaluation (Signed)
Physical Therapy Evaluation Patient Details Name: [name] MRN: [id number] DOB: [birth year] Today's Date: 12/14/2016   History of Present Illness  Pt is an 83 y/o M who presented with transient left tongue numbness and family reports of L facial droop. MRI shows several small foci of acute infarction in the R MCA distribution with no acute hemorrhage. PMHx includes CVA, COPD, HTN, CAD, DM  Clinical Impression  Pt admitted with above diagnosis. Pt currently with functional limitations due to the deficits listed below (see PT Problem List). At the time of PT eval pt was able to perform transfers and ambulation with min guard assist for balance support and safety. Pt used SPC during session as this is the AD he has been using at home. Pt overall steady with cane aside from occasional side step due to imbalance. Pt did not require assistance to recover during these times. Pt will benefit from skilled PT to increase their independence and safety with mobility to allow discharge to the venue listed below.       Follow Up Recommendations Home health PT;Supervision for mobility/OOB    Equipment Recommendations  None recommended by PT    Recommendations for Other Services       Precautions / Restrictions Precautions Precautions: Fall Restrictions Weight Bearing Restrictions: No      Mobility  Bed Mobility               General bed mobility comments: Pt sitting up in recliner upon PT arrival.   Transfers Overall transfer level: Needs assistance Equipment used: Straight cane Transfers: Sit to/from Stand Sit to Stand: Modified independent (Device/Increase time)         General transfer comment: Cane in L hand. Pt was able to power-up to full stand without assistance. VC's for positioning in front of chair prior to initiating stand>sit  Ambulation/Gait Ambulation/Gait assistance: Min guard Ambulation Distance (Feet): 200 Feet Assistive device: Straight cane Gait  Pattern/deviations: Step-through pattern;Decreased stride length;Trunk flexed Gait velocity: Decreased Gait velocity interpretation: Below normal speed for age/gender General Gait Details: Pt was able to ambulate fairly well in hall with [facility] and no noted LOB. Pt appeared unsteady at times however did not require assist to recover.   Stairs Stairs: Yes Stairs assistance: Min guard Stair Management: One rail Right;Alternating pattern;Forwards Number of Stairs: 5 General stair comments: VC's for sequencing and general safety.   Wheelchair Mobility    Modified Rankin (Stroke Patients Only)       Balance Overall balance assessment: Needs assistance Sitting-balance support: Feet supported;No upper extremity supported Sitting balance-Leahy Scale: Good     Standing balance support: Bilateral upper extremity supported;During functional activity Standing balance-Leahy Scale: Fair                               Pertinent Vitals/Pain Pain Assessment: No/denies pain Pain Score: 0-No pain    Home Living Family/patient expects to be discharged to:: Private residence Living Arrangements: Spouse/significant other Available Help at Discharge: Family;Available 24 hours/day Type of Home: House Home Access: Level entry (one step from living room to rest of the house)     Home Layout: One level Home Equipment: Cane - single point      Prior Function Level of Independence: Independent with assistive device(s)         Comments: Uses cane for ambulation      Hand Dominance   Dominant Hand: Left    Extremity/Trunk  Assessment   Upper Extremity Assessment Upper Extremity Assessment: Defer to OT evaluation    Lower Extremity Assessment Lower Extremity Assessment: RLE deficits/detail RLE Deficits / Details: Pt reports "bone on bone" degeneration in R knee with planned TKA in July. Noted large cyst protruding from anterior/lateral side of knee.    Cervical / Trunk  Assessment Cervical / Trunk Assessment: Other exceptions Cervical / Trunk Exceptions: Forward head/rounded shoulders  Communication   Communication: No difficulties  Cognition Arousal/Alertness: Awake/alert Behavior During Therapy: WFL for tasks assessed/performed Overall Cognitive Status: Impaired/Different from baseline Area of Impairment: Following commands;Safety/judgement;Problem solving;Attention;Memory;Awareness                   Current Attention Level: Selective Memory: Decreased short-term memory;Decreased recall of precautions Following Commands: Follows one step commands consistently;Follows multi-step commands inconsistently Safety/Judgement: Decreased awareness of safety;Decreased awareness of deficits Awareness: Emergent Problem Solving: Slow processing;Requires verbal cues;Requires tactile cues General Comments: some impulsivity noted       General Comments      Exercises     Assessment/Plan    PT Assessment Patient needs continued PT services  PT Problem List Decreased strength;Decreased range of motion;Decreased activity tolerance;Decreased balance;Decreased mobility;Decreased knowledge of use of DME;Decreased safety awareness;Decreased knowledge of precautions       PT Treatment Interventions DME instruction;Gait training;Stair training;Functional mobility training;Therapeutic activities;Therapeutic exercise;Neuromuscular re-education;Patient/family education    PT Goals (Current goals can be found in the Care Plan section)  Acute Rehab PT Goals Patient Stated Goal: none stated PT Goal Formulation: With patient/family Time For Goal Achievement: 12/21/16 Potential to Achieve Goals: Good    Frequency Min 3X/week   Barriers to discharge        Co-evaluation               AM-PAC PT "6 Clicks" Daily Activity  Outcome Measure   Difficulty moving from lying on back to sitting on the side of the bed? : None Difficulty sitting down on and  standing up from a chair with arms (e.g., wheelchair, bedside commode, etc,.)?: A Little Help needed moving to and from a bed to chair (including a wheelchair)?: A Little Help needed walking in hospital room?: A Little   6 Click Score: 13    End of Session Equipment Utilized During Treatment: Gait belt Activity Tolerance: Patient tolerated treatment well Patient left: in chair;with call bell/phone within reach;with family/visitor present Nurse Communication: Mobility status PT Visit Diagnosis: Unsteadiness on feet (R26.81);Muscle weakness (generalized) (M62.81)    Time: 6734-1937 PT Time Calculation (min) (ACUTE ONLY): 22 min   Charges:   PT Evaluation $PT Eval Moderate Complexity: 1 Procedure     PT G Codes:        Rolinda Roan, PT, DPT Acute Rehabilitation Services Pager: [phone]   Thelma Comp 12/14/2016, 1:55 PM

## 2016-12-14 NOTE — Progress Notes (Signed)
PROGRESS NOTE    [name]  DXA:[id number] DOB: February 24, 1930 DOA: 12/13/2016 PCP: Timmothy Euler, MD   Outpatient Specialists:     Brief Narrative:  [name] is a 83 y.o. male with medical history significant for COPD, hypertension, coronary artery disease status post remote atherectomy, history of CVA, and type 2 diabetes mellitus who now presents to the emergency department for evaluation of transient, recurrent numbness involving the left side of the tongue with dysarthria. The patient reports that he been in his usual state until several days ago when he developed a nonspecific malaise and loss of appetite. He has been eating and drinking very little since that time. On 12/10/2016, patient developed sudden onset of numbness involving the left half of this tongue with dysarthria. This lasted 2-3 minutes and resolved. Symptoms recurred twice today, lasting 2-3 minutes each time, and again associated with dysarthria. Patient denies any recent fall or trauma, denies headache, change in vision or hearing, or focal numbness or weakness aside from the tongue. He denies any chest pain or palpitations. He continues to take Plavix and statin. He denies abdominal pain, nausea, vomiting, or diarrhea, but notes that he has just not "felt quite right," and has had very poor appetite. He denies any choking or cough or dyspnea.   Assessment & Plan:   Principal Problem:   TIA (transient ischemic attack) Active Problems:   Essential hypertension   CORONARY ATHEROSCLEROSIS NATIVE CORONARY ARTERY   CKD (chronic kidney disease) stage 3, GFR 30-59 ml/min   Dysarthria   DM type 2 (diabetes mellitus, type 2) (HCC)   History of stroke   GERD (gastroesophageal reflux disease)   Numbness of tongue   Hyponatremia   CVA with Transient tongue numbness and dysarthria  -MRI + -PT/OT- home health -neuro consulted  -LDL: 27 -echo/carotid pending -already on plavix/ASA TEE/loop recommended by  neuro  Hyponatremia   - Serum sodium is 127 on admission, previously normal  - improved with IVF   Hx of CVA - Continue Plavix and statin    CAD  - Status-post remote LAD atherectomy after MI at [facility]  - No anginal complaint, EKG without acute ischemic features, troponin <0.03  - Continue Plavix and Lipitor; hold benazepril and Lopressor in acute phase of possible CVA pending MRI    Hypertension  - BP slightly elevated  - Managed at home with hydralazine, benazepril, HCTZ, and Lopressor  - Antihypertensives held initially while evaluating for acute CVA/TIA   Type II DM  - A1c was 6.1% in May 2017  - Managed with metformin only at home; held  - Check CBG with meals and qHS  - Start a low-intensity Novolog correctional   CKD stage III  - SCr is 1.46, slightly up from apparent baseline of ~1.2  - He is being hydrated with NS as above and ACEi is held  - Repeat chem panel pending    DVT prophylaxis:  SQ Heparin  Code Status: Full Code   Family Communication: wife  Disposition Plan:     Consultants:   neuro   Subjective: Wants to go home  Objective: Vitals:   12/14/16 0330 12/14/16 0530 12/14/16 0928 12/14/16 0947  BP: 126/61 111/67  (!) 99/53  Pulse: 62 65  72  Resp: 18 18    Temp: 98.1 F (36.7 C) 98.1 F (36.7 C)  98.7 F (37.1 C)  TempSrc: Oral Oral  Oral  SpO2: 98% 95% 92% 97%    Intake/Output Summary (  Last 24 hours) at 12/14/16 1405 Last data filed at 12/14/16 0500  Gross per 24 hour  Intake              660 ml  Output                0 ml  Net              660 ml   There were no vitals filed for this visit.  Examination:  General exam: Appears calm and comfortable  Respiratory system: Clear to auscultation. Respiratory effort normal. Cardiovascular system: S1 & S2 heard, RRR. No JVD, murmurs, rubs, gallops or clicks. No pedal edema. Gastrointestinal system: Abdomen is nondistended, soft and nontender. No organomegaly or masses felt.  Normal bowel sounds heard. Central nervous system: Alert and oriented. No focal neurological deficits.     Data Reviewed: I have personally reviewed following labs and imaging studies  CBC:  Recent Labs Lab 12/13/16 1732 12/13/16 1738  WBC 8.3  --   NEUTROABS 5.5  --   HGB 14.8 14.6  HCT 41.9 43.0  MCV 91.5  --   PLT 201  --    Basic Metabolic Panel:  Recent Labs Lab 12/13/16 1732 12/13/16 1738 12/14/16 0509 12/14/16 1130  NA 127* 130* 130*  --   K 3.5 3.5 3.1*  --   CL 90* 87* 94*  --   CO2 26  --  26  --   GLUCOSE 160* 152* 128*  --   BUN 12 14 11   --   CREATININE 1.46* 1.30* 1.09  --   CALCIUM 9.4  --  8.8*  --   MG  --   --   --  1.6*   GFR: CrCl cannot be calculated (Unknown ideal weight.). Liver Function Tests:  Recent Labs Lab 12/13/16 1732  AST 28  ALT 18  ALKPHOS 65  BILITOT 1.4*  PROT 6.9  ALBUMIN 4.2   No results for input(s): LIPASE, AMYLASE in the last 168 hours. No results for input(s): AMMONIA in the last 168 hours. Coagulation Profile:  Recent Labs Lab 12/13/16 1732  INR 1.03   Cardiac Enzymes: No results for input(s): CKTOTAL, CKMB, CKMBINDEX, TROPONINI in the last 168 hours. BNP (last 3 results) No results for input(s): PROBNP in the last 8760 hours. HbA1C: No results for input(s): HGBA1C in the last 72 hours. CBG: No results for input(s): GLUCAP in the last 168 hours. Lipid Profile:  Recent Labs  12/14/16 0509  CHOL 85  HDL 30*  LDLCALC 27  TRIG 139  CHOLHDL 2.8   Thyroid Function Tests: No results for input(s): TSH, T4TOTAL, FREET4, T3FREE, THYROIDAB in the last 72 hours. Anemia Panel: No results for input(s): VITAMINB12, FOLATE, FERRITIN, TIBC, IRON, RETICCTPCT in the last 72 hours. Urine analysis:    Component Value Date/Time   COLORURINE YELLOW 12/13/2016 2045   APPEARANCEUR CLEAR 12/13/2016 2045   LABSPEC 1.011 12/13/2016 2045   PHURINE 6.0 12/13/2016 2045   GLUCOSEU NEGATIVE 12/13/2016 2045   HGBUR  NEGATIVE 12/13/2016 2045   BILIRUBINUR NEGATIVE 12/13/2016 2045   BILIRUBINUR neg 05/02/2014 [place] 12/13/2016 2045   PROTEINUR NEGATIVE 12/13/2016 2045   UROBILINOGEN negative 05/02/2014 0956   NITRITE NEGATIVE 12/13/2016 2045   LEUKOCYTESUR NEGATIVE 12/13/2016 2045     )No results found for this or any previous visit (from the past 240 hour(s)).    Anti-infectives    None       Radiology  Studies: Ct Head Wo Contrast  Result Date: 12/13/2016 CLINICAL DATA:  Left tongue numbness. EXAM: CT HEAD WITHOUT CONTRAST TECHNIQUE: Contiguous axial images were obtained from the base of the skull through the vertex without intravenous contrast. COMPARISON:  Head CT and brain MRI 12/10/2015 FINDINGS: Brain: Stable degree of atrophy and chronic small vessel ischemia. No evidence of acute infarct or large vessel ischemia. No mass effect, midline shift, hydrocephalus or extra-axial fluid collection. Vascular: Atherosclerosis of skullbase vasculature without hyperdense vessel or abnormal calcification. Skull: Normal. Negative for fracture or focal lesion. Sinuses/Orbits: Paranasal sinuses and mastoid air cells are clear. The visualized orbits are unremarkable. Bilateral cataract resection. Other: None. IMPRESSION: Similar degree of atrophy and chronic small vessel ischemia. No evidence of acute intracranial abnormality. Electronically Signed   By: Jeb Levering M.D.   On: 12/13/2016 21:09   Mr Jodene Nam Neck W Wo Contrast  Result Date: 12/14/2016 CLINICAL DATA:  83 y/o M; transient left tongue numbness, left facial droop, and speech changes. EXAM: MR HEAD WITHOUT CONTRAST MRA HEAD WITHOUT CONTRAST MRA OF THE NECK WITHOUT AND WITH CONTRAST TECHNIQUE: Multiplanar, multiecho pulse sequences of the brain and surrounding structures were obtained without intravenous contrast. Angiographic images of the neck were obtained using MRA technique without and with intravenous contrast. Angiographic  images of the head were obtained using MRA technique without intravenous contrast. CONTRAST:  8 cc MultiHance. COMPARISON:  12/13/2016 CT head. FINDINGS: MR HEAD FINDINGS Brain: 5 foci of reduced diffusion measuring up to 6 mm are present in right anterior caudate body, right posterior frontal cortex, right frontal operculum, right posterolateral temporal cortex, and right medial temporal cortex. Small chronic lacunar infarcts are present in bilateral cerebellar hemispheres, right pons, and bilateral lentiform nuclei. Foci of T2 FLAIR hyperintense signal abnormality in subcortical and periventricular white matter are compatible with moderate chronic microvascular ischemic changes and there is moderate brain parenchymal volume loss. No abnormal susceptibility hypointensity to indicate intracranial hemorrhage. No hydrocephalus, extra-axial collection, or focal mass effect. Vascular: As below. Skull and upper cervical spine: Normal marrow signal. Sinuses/Orbits: Negative. Other: Bilateral intra-ocular lens replacement. MRA HEAD FINDINGS Internal carotid arteries:  Patent. Anterior cerebral arteries:  Patent. Middle cerebral arteries: Patent. Anterior communicating artery: Possible diminutive anterior communicating artery. Posterior communicating arteries:  Patent. Posterior cerebral arteries: Patent. Large right posterior communicating artery and small right P1 segment compatible persistent fetal circulation. Moderate stenosis of proximal left P1 segment. Basilar artery:  Patent. Vertebral arteries:  Patent. No evidence of high-grade stenosis, large vessel occlusion, or aneurysm identified. MRA NECK FINDINGS Motion degraded study, suboptimal evaluation for subtle stenosis or aneurysm. Aortic arch: Patent. Right common carotid artery: Patent. Right internal carotid artery: Patent. Mild irregularity of the proximal ICA and carotid bifurcation with minimal stenosis is likely due to atherosclerosis. Possible plaque  ulceration of right proximal ICA (series 11602, image 63). Right vertebral artery: Patent. Left common carotid artery: Patent. Left Internal carotid artery: Patent. Mild irregularity of proximal ICA carotid bifurcation without significant stenosis. Left Vertebral artery: Patent. There is no evidence of high-grade stenosis, occlusion, or aneurysm identified. IMPRESSION: 1. Several small (less than 6 mm) foci of acute infarction are present in the right MCA distribution. No acute hemorrhage. 2. Moderate chronic microvascular ischemic changes and parenchymal volume loss of the brain. 3. Patent circle of Willis. No large vessel occlusion, aneurysm, or significant stenosis is identified. 4. Motion degraded MRA of the neck, suboptimal evaluation for subtle stenosis or aneurysm. 5. Patent carotid and vertebral arteries. No  large vessel occlusion or high-grade stenosis is identified. 6. Stable probable plaque ulceration of the right carotid bulb. These results will be called to the ordering clinician or representative by the Radiologist Assistant, and communication documented in the PACS or zVision Dashboard. Electronically Signed   By: Kristine Garbe M.D.   On: 12/14/2016 05:34   Mr Brain Wo Contrast  Result Date: 12/14/2016 CLINICAL DATA:  83 y/o M; transient left tongue numbness, left facial droop, and speech changes. EXAM: MR HEAD WITHOUT CONTRAST MRA HEAD WITHOUT CONTRAST MRA OF THE NECK WITHOUT AND WITH CONTRAST TECHNIQUE: Multiplanar, multiecho pulse sequences of the brain and surrounding structures were obtained without intravenous contrast. Angiographic images of the neck were obtained using MRA technique without and with intravenous contrast. Angiographic images of the head were obtained using MRA technique without intravenous contrast. CONTRAST:  8 cc MultiHance. COMPARISON:  12/13/2016 CT head. FINDINGS: MR HEAD FINDINGS Brain: 5 foci of reduced diffusion measuring up to 6 mm are present in right  anterior caudate body, right posterior frontal cortex, right frontal operculum, right posterolateral temporal cortex, and right medial temporal cortex. Small chronic lacunar infarcts are present in bilateral cerebellar hemispheres, right pons, and bilateral lentiform nuclei. Foci of T2 FLAIR hyperintense signal abnormality in subcortical and periventricular white matter are compatible with moderate chronic microvascular ischemic changes and there is moderate brain parenchymal volume loss. No abnormal susceptibility hypointensity to indicate intracranial hemorrhage. No hydrocephalus, extra-axial collection, or focal mass effect. Vascular: As below. Skull and upper cervical spine: Normal marrow signal. Sinuses/Orbits: Negative. Other: Bilateral intra-ocular lens replacement. MRA HEAD FINDINGS Internal carotid arteries:  Patent. Anterior cerebral arteries:  Patent. Middle cerebral arteries: Patent. Anterior communicating artery: Possible diminutive anterior communicating artery. Posterior communicating arteries:  Patent. Posterior cerebral arteries: Patent. Large right posterior communicating artery and small right P1 segment compatible persistent fetal circulation. Moderate stenosis of proximal left P1 segment. Basilar artery:  Patent. Vertebral arteries:  Patent. No evidence of high-grade stenosis, large vessel occlusion, or aneurysm identified. MRA NECK FINDINGS Motion degraded study, suboptimal evaluation for subtle stenosis or aneurysm. Aortic arch: Patent. Right common carotid artery: Patent. Right internal carotid artery: Patent. Mild irregularity of the proximal ICA and carotid bifurcation with minimal stenosis is likely due to atherosclerosis. Possible plaque ulceration of right proximal ICA (series 11602, image 63). Right vertebral artery: Patent. Left common carotid artery: Patent. Left Internal carotid artery: Patent. Mild irregularity of proximal ICA carotid bifurcation without significant stenosis. Left  Vertebral artery: Patent. There is no evidence of high-grade stenosis, occlusion, or aneurysm identified. IMPRESSION: 1. Several small (less than 6 mm) foci of acute infarction are present in the right MCA distribution. No acute hemorrhage. 2. Moderate chronic microvascular ischemic changes and parenchymal volume loss of the brain. 3. Patent circle of Willis. No large vessel occlusion, aneurysm, or significant stenosis is identified. 4. Motion degraded MRA of the neck, suboptimal evaluation for subtle stenosis or aneurysm. 5. Patent carotid and vertebral arteries. No large vessel occlusion or high-grade stenosis is identified. 6. Stable probable plaque ulceration of the right carotid bulb. These results will be called to the ordering clinician or representative by the Radiologist Assistant, and communication documented in the PACS or zVision Dashboard. Electronically Signed   By: Kristine Garbe M.D.   On: 12/14/2016 05:34   Mr Jodene Nam Head/brain TT Cm  Result Date: 12/14/2016 CLINICAL DATA:  83 y/o M; transient left tongue numbness, left facial droop, and speech changes. EXAM: MR HEAD WITHOUT CONTRAST MRA HEAD  WITHOUT CONTRAST MRA OF THE NECK WITHOUT AND WITH CONTRAST TECHNIQUE: Multiplanar, multiecho pulse sequences of the brain and surrounding structures were obtained without intravenous contrast. Angiographic images of the neck were obtained using MRA technique without and with intravenous contrast. Angiographic images of the head were obtained using MRA technique without intravenous contrast. CONTRAST:  8 cc MultiHance. COMPARISON:  12/13/2016 CT head. FINDINGS: MR HEAD FINDINGS Brain: 5 foci of reduced diffusion measuring up to 6 mm are present in right anterior caudate body, right posterior frontal cortex, right frontal operculum, right posterolateral temporal cortex, and right medial temporal cortex. Small chronic lacunar infarcts are present in bilateral cerebellar hemispheres, right pons, and  bilateral lentiform nuclei. Foci of T2 FLAIR hyperintense signal abnormality in subcortical and periventricular white matter are compatible with moderate chronic microvascular ischemic changes and there is moderate brain parenchymal volume loss. No abnormal susceptibility hypointensity to indicate intracranial hemorrhage. No hydrocephalus, extra-axial collection, or focal mass effect. Vascular: As below. Skull and upper cervical spine: Normal marrow signal. Sinuses/Orbits: Negative. Other: Bilateral intra-ocular lens replacement. MRA HEAD FINDINGS Internal carotid arteries:  Patent. Anterior cerebral arteries:  Patent. Middle cerebral arteries: Patent. Anterior communicating artery: Possible diminutive anterior communicating artery. Posterior communicating arteries:  Patent. Posterior cerebral arteries: Patent. Large right posterior communicating artery and small right P1 segment compatible persistent fetal circulation. Moderate stenosis of proximal left P1 segment. Basilar artery:  Patent. Vertebral arteries:  Patent. No evidence of high-grade stenosis, large vessel occlusion, or aneurysm identified. MRA NECK FINDINGS Motion degraded study, suboptimal evaluation for subtle stenosis or aneurysm. Aortic arch: Patent. Right common carotid artery: Patent. Right internal carotid artery: Patent. Mild irregularity of the proximal ICA and carotid bifurcation with minimal stenosis is likely due to atherosclerosis. Possible plaque ulceration of right proximal ICA (series 11602, image 63). Right vertebral artery: Patent. Left common carotid artery: Patent. Left Internal carotid artery: Patent. Mild irregularity of proximal ICA carotid bifurcation without significant stenosis. Left Vertebral artery: Patent. There is no evidence of high-grade stenosis, occlusion, or aneurysm identified. IMPRESSION: 1. Several small (less than 6 mm) foci of acute infarction are present in the right MCA distribution. No acute hemorrhage. 2.  Moderate chronic microvascular ischemic changes and parenchymal volume loss of the brain. 3. Patent circle of Willis. No large vessel occlusion, aneurysm, or significant stenosis is identified. 4. Motion degraded MRA of the neck, suboptimal evaluation for subtle stenosis or aneurysm. 5. Patent carotid and vertebral arteries. No large vessel occlusion or high-grade stenosis is identified. 6. Stable probable plaque ulceration of the right carotid bulb. These results will be called to the ordering clinician or representative by the Radiologist Assistant, and communication documented in the PACS or zVision Dashboard. Electronically Signed   By: Kristine Garbe M.D.   On: 12/14/2016 05:34        Scheduled Meds: . aspirin  325 mg Oral Daily  . atorvastatin  40 mg Oral Daily  . clopidogrel  75 mg Oral Daily  . feeding supplement (GLUCERNA SHAKE)  237 mL Oral TID BM  . heparin  5,000 Units Subcutaneous Q8H  . mometasone-formoterol  2 puff Inhalation BID  . omega-3 acid ethyl esters  2 g Oral Daily  . pantoprazole  40 mg Oral Daily  . potassium chloride  40 mEq Oral Once   Continuous Infusions:   LOS: 1 day    Time spent: 25 min    [place], DO [facility] Pager [phone]  If 7PM-7AM, please contact night-coverage [email or web address] Password  [email or web address] 12/14/2016, 2:05 PM

## 2016-12-14 NOTE — Progress Notes (Signed)
  Echocardiogram 2D Echocardiogram has been performed.  Donata Clay 12/14/2016, 12:38 PM

## 2016-12-14 NOTE — Evaluation (Signed)
Speech Language Pathology Evaluation Patient Details Name: [name] MRN: [id number] DOB: August 03, 1930 Today's Date: 12/14/2016 Time: 0258-5277 SLP Time Calculation (min) (ACUTE ONLY): 28 min  Problem List:  Patient Active Problem List   Diagnosis Date Noted  . Numbness of tongue 12/13/2016  . Hyponatremia 12/13/2016  . GERD (gastroesophageal reflux disease) 02/25/2016  . Ischemic cerebrovascular accident (CVA) of frontal lobe ([place]) 12/15/2015  . Malnutrition of moderate degree 12/11/2015  . History of stroke 12/10/2015  . DM type 2 (diabetes mellitus, type 2) ([place]) 12/06/2015  . Dysarthria 12/05/2015  . CKD (chronic kidney disease) stage 3, GFR 30-59 ml/min 07/17/2015  . Cerebral infarction ([place]) 07/15/2015  . TIA (transient ischemic attack) 09/12/2013  . Metabolic syndrome 82/42/3536  . Vitamin D deficiency 08/01/2013  . PVD (peripheral vascular disease) ([place]) 11/25/2011  . ILIAC ARTERY ANEURYSM 07/17/2010  . Hyperlipidemia LDL goal <70 08/28/2009  . CORONARY ATHEROSCLEROSIS NATIVE CORONARY ARTERY 08/28/2009  . Essential hypertension 06/05/2009  . Cerebral atherosclerosis 06/04/2009   Past Medical History:  Past Medical History:  Diagnosis Date  . Arthritis    "wrists, knees, back" (12/10/2015)  . Asthma   . Cerebral atherosclerosis   . Chronic back pain    "lower primarily" (12/10/2015)  . CORONARY ATHEROSCLEROSIS NATIVE CORONARY ARTERY    Myocardial infarction at [facility] in 1996 with a directional atherectomy of the 90% LAD stenosis and medical management of 60-70% proximal circumflex and 50% RCA stenosis.  . DYSLIPIDEMIA   . GERD (gastroesophageal reflux disease)   . History of hiatal hernia   . HYPERTENSION   . ILIAC ARTERY ANEURYSM   . Myocardial infarction ([place]) 1996  . Stroke [facility]) 2003; 2013; 07/2015; 12/05/2015   denies residual on 12/10/2015  . TIA (transient ischemic attack)   . Type II diabetes mellitus ([place])   . UPPER RESPIRATORY INFECTION    Past  Surgical History:  Past Surgical History:  Procedure Laterality Date  . ANAL FISSURE REPAIR  1990s  . BASAL CELL CARCINOMA EXCISION Right 12/2015   "temple"  . CATARACT EXTRACTION W/ INTRAOCULAR LENS  IMPLANT, BILATERAL Bilateral 2000s  . CORONARY ANGIOPLASTY  1996  . KNEE CARTILAGE SURGERY Right 1970s  . SHOULDER ARTHROSCOPY W/ ROTATOR CUFF REPAIR Left 2000s   HPI:  Pt is an 83 y/o M who presented with transient left tongue numbness and family reports of L facial droop. MRI shows several small foci of acute infarction in the R MCA distribution with no acute hemorrhage. PMHx includes CVA, COPD, HTN, CAD, DM   Assessment / Plan / Recommendation Clinical Impression  83 yr old assessed having multiple previous strokes and history of cognitive deficits following strokes. Wife reports his thinking is slower after each stroke but improves to baseline with time. Speech is intelligible and verbal expression is appropriate. Cognitive deficits present in information processing, attention, memory (mild-mod retrieval) and visuospatial. Pt would benefit from short term ST for functional cognition at home.          SLP Assessment  SLP Recommendation/Assessment: Patient needs continued Speech Lanaguage Pathology Services SLP Visit Diagnosis: Cognitive communication deficit (R41.841)    Follow Up Recommendations  Home health SLP    Frequency and Duration min 2x/week  2 weeks      SLP Evaluation Cognition  Overall Cognitive Status: Impaired/Different from baseline Arousal/Alertness: Awake/alert Orientation Level: Oriented X4 Attention: Sustained Sustained Attention: Impaired Sustained Attention Impairment: Functional basic Memory: Impaired Memory Impairment: Retrieval deficit Awareness: Appears intact Problem Solving: Appears intact (suspect  difficulty performing more complex activities) Safety/Judgment: Impaired       Comprehension  Auditory Comprehension Overall Auditory Comprehension:  Appears within functional limits for tasks assessed Visual Recognition/Discrimination Discrimination: Not tested Reading Comprehension Reading Status:  (to be assessed)    Expression Expression Primary Mode of Expression: Verbal Verbal Expression Overall Verbal Expression: Appears within functional limits for tasks assessed Written Expression Dominant Hand: Left Written Expression:  (to be assessed)   Oral / Motor  Oral Motor/Sensory Function Overall Oral Motor/Sensory Function: Mild impairment Facial ROM: Reduced right;Suspected CN VII (facial) dysfunction Facial Symmetry: Abnormal symmetry right;Suspected CN VII (facial) dysfunction Facial Strength: Within Functional Limits Lingual ROM: Within Functional Limits Lingual Symmetry: Abnormal symmetry right;Suspected CN XII (hypoglossal) dysfunction Motor Speech Overall Motor Speech: Appears within functional limits for tasks assessed Intelligibility: Intelligible   GO                    Houston Siren 12/14/2016, 11:29 AM  Orbie Pyo Colvin Caroli.Ed [employer] [phone]

## 2016-12-14 NOTE — Evaluation (Addendum)
Occupational Therapy Evaluation Patient Details Name: [name] MRN: [id number] DOB: 1930-05-06 Today's Date: 12/14/2016    History of Present Illness Pt is an 83 y/o M who presented with transient left tongue numbness and family reports of L facial droop. MRI shows several small foci of acute infarction in the R MCA distribution with no acute hemorrhage. PMHx includes CVA, COPD, HTN, CAD, DM   Clinical Impression   This 83 y/o M presents with the above. At baseline Pt is ModI with ADLs and functional mobility. Pt currently requires MinA for completing ADLs and functional mobility, requires verbal and tactile cues throughout session due to decreased safety awareness. Pt will have 24 hr assist from spouse after discharge home for ADL needs PRN. Pt will benefit from continued OT services to increase safety and independence with ADLs and functional mobility upon return home.      Follow Up Recommendations  Home health OT;Supervision/Assistance - 24 hour    Equipment Recommendations  None recommended by OT           Precautions / Restrictions Precautions Precautions: Fall Restrictions Weight Bearing Restrictions: No      Mobility Bed Mobility Overal bed mobility: Needs Assistance Bed Mobility: Supine to Sit     Supine to sit: Supervision        Transfers Overall transfer level: Needs assistance Equipment used: Rolling walker (2 wheeled) Transfers: Sit to/from Stand Sit to Stand: Min guard         General transfer comment: increased assist for safe use of RW     Balance Overall balance assessment: Needs assistance Sitting-balance support: Feet supported;No upper extremity supported Sitting balance-Leahy Scale: Good     Standing balance support: Bilateral upper extremity supported;During functional activity Standing balance-Leahy Scale: Fair                             ADL either performed or assessed with clinical judgement   ADL Overall ADL's  : Needs assistance/impaired Eating/Feeding: Set up;Sitting   Grooming: Wash/dry face;Oral care;Min guard;Standing   Upper Body Bathing: Min guard;Sitting   Lower Body Bathing: Min guard;Sit to/from stand   Upper Body Dressing : Set up;Sitting   Lower Body Dressing: Sit to/from stand;Minimal assistance Lower Body Dressing Details (indicate cue type and reason): donning socks Toilet Transfer: Minimal assistance;Ambulation;Regular Toilet;Grab bars;RW   Toileting- Water quality scientist and Hygiene: Min guard;Sit to/from stand       Functional mobility during ADLs: Min guard;Rolling walker General ADL Comments: Pt typically uses cane for functional mobility, requires increased verbal and tactile cues to use RW safely     Vision Baseline Vision/History: Wears glasses;Cataracts Vision Assessment?: Yes Eye Alignment: Within Functional Limits Additional Comments: Difficult to assess as Pt had increased difficulty following instructions to complete vision assessment, during visual tracking Pt was not able to track to L superior/inferior visual field (questions whether visual deficit vs not following through with instructions), no significant visual deficits noted during functional tasks                 Pertinent Vitals/Pain Pain Assessment: Faces Faces Pain Scale: No hurt     Hand Dominance Left   Extremity/Trunk Assessment Upper Extremity Assessment Upper Extremity Assessment: LUE deficits/detail LUE Deficits / Details: Minimal coordination deficits noted during finger opposition, finger to nose            Communication Communication Communication: Receptive difficulties;Expressive difficulties (slower processing )   Cognition  Arousal/Alertness: Awake/alert Behavior During Therapy: WFL for tasks assessed/performed Overall Cognitive Status: Impaired/Different from baseline Area of Impairment: Following commands;Safety/judgement;Problem solving;Attention;Memory;Awareness                    Current Attention Level: Selective Memory: Decreased short-term memory;Decreased recall of precautions Following Commands: Follows one step commands consistently;Follows multi-step commands inconsistently Safety/Judgement: Decreased awareness of safety Awareness: Emergent Problem Solving: Slow processing;Requires verbal cues;Requires tactile cues General Comments: some impulsivity noted    General Comments                  Home Living Family/patient expects to be discharged to:: Private residence Living Arrangements: Spouse/significant other Available Help at Discharge: Family;Available PRN/intermittently Type of Home: House Home Access: Level entry (one step from living room to rest of the house)     Home Layout: One level     Bathroom Shower/Tub: Tub/shower unit;Curtain   Biochemist, clinical: Standard Bathroom Accessibility: Yes How Accessible: Accessible via walker Home Equipment: [place] - single point          Prior Functioning/Environment Level of Independence: Independent with assistive device(s)        Comments: Uses cane for ambulation         OT Problem List: Decreased safety awareness;Decreased strength;Decreased activity tolerance      OT Treatment/Interventions: Self-care/ADL training;Therapeutic exercise;DME and/or AE instruction;Therapeutic activities;Cognitive remediation/compensation;Visual/perceptual remediation/compensation;Energy conservation;Patient/family education    OT Goals(Current goals can be found in the care plan section) Acute Rehab OT Goals Patient Stated Goal: none stated OT Goal Formulation: With patient Time For Goal Achievement: 12/28/16 Potential to Achieve Goals: Good ADL Goals Pt Will Perform Grooming: with supervision;standing Pt Will Perform Lower Body Bathing: with supervision;sit to/from stand Pt Will Perform Lower Body Dressing: with supervision;sit to/from stand Pt Will Transfer to Toilet: with  supervision;ambulating;regular height toilet Pt Will Perform Toileting - Clothing Manipulation and hygiene: with supervision;sit to/from stand Additional ADL Goal #1: Pt will complete ADL tasks with no more than 1 verbal cue for safe task completion.   OT Frequency: Min 2X/week                             AM-PAC PT "6 Clicks" Daily Activity     Outcome Measure Help from another person eating meals?: None Help from another person taking care of personal grooming?: A Little Help from another person toileting, which includes using toliet, bedpan, or urinal?: A Little Help from another person bathing (including washing, rinsing, drying)?: A Little Help from another person to put on and taking off regular upper body clothing?: A Little Help from another person to put on and taking off regular lower body clothing?: A Little 6 Click Score: 19   End of Session Equipment Utilized During Treatment: Gait belt;Rolling walker Nurse Communication: Mobility status  Activity Tolerance: Patient tolerated treatment well Patient left: in chair;with call bell/phone within reach;with family/visitor present  OT Visit Diagnosis: Other symptoms and signs involving cognitive function;Unsteadiness on feet (R26.81)                Time: 6967-8938 OT Time Calculation (min): 37 min Charges:  OT General Charges $OT Visit: 1 Procedure OT Evaluation $OT Eval Low Complexity: 1 Procedure OT Treatments $Self Care/Home Management : 8-22 mins G-Codes:     Lou Cal, OT Pager [phone] 12/14/2016   Raymondo Band 12/14/2016, 9:32 AM

## 2016-12-15 ENCOUNTER — Inpatient Hospital Stay (HOSPITAL_COMMUNITY): Payer: Medicare Other

## 2016-12-15 ENCOUNTER — Encounter (HOSPITAL_COMMUNITY)
Admission: EM | Disposition: A | Discharge status: Home Health | Payer: Self-pay | Source: Home / Self Care | Attending: Internal Medicine

## 2016-12-15 ENCOUNTER — Other Ambulatory Visit: Payer: Self-pay | Admitting: Internal Medicine

## 2016-12-15 ENCOUNTER — Encounter (HOSPITAL_COMMUNITY): Payer: Self-pay | Admitting: *Deleted

## 2016-12-15 DIAGNOSIS — I639 Cerebral infarction, unspecified: Secondary | ICD-10-CM

## 2016-12-15 DIAGNOSIS — I340 Nonrheumatic mitral (valve) insufficiency: Secondary | ICD-10-CM

## 2016-12-15 HISTORY — PX: LOOP RECORDER INSERTION: EP1214

## 2016-12-15 HISTORY — PX: TEE WITHOUT CARDIOVERSION: SHX5443

## 2016-12-15 LAB — BASIC METABOLIC PANEL
ANION GAP: 10 (ref 5–15)
BUN: 9 mg/dL (ref 6–20)
CALCIUM: 9 mg/dL (ref 8.9–10.3)
CO2: 24 mmol/L (ref 22–32)
Chloride: 100 mmol/L — ABNORMAL LOW (ref 101–111)
Creatinine, Ser: 1.08 mg/dL (ref 0.61–1.24)
GFR calc Af Amer: >60 mL/min (ref >60)
GLUCOSE: 152 mg/dL — AB (ref 65–99)
Potassium: 3.8 mmol/L (ref 3.5–5.1)
Sodium: 134 mmol/L — ABNORMAL LOW (ref 135–145)

## 2016-12-15 LAB — CBC
HEMATOCRIT: 38.9 % — AB (ref 39.0–52.0)
HEMOGLOBIN: 13.7 g/dL (ref 13.0–17.0)
MCH: 32.6 pg (ref 26.0–34.0)
MCHC: 35.2 g/dL (ref 30.0–36.0)
MCV: 92.6 fL (ref 78.0–100.0)
Platelets: 163 10*3/uL (ref 150–400)
RBC: 4.2 MIL/uL — AB (ref 4.22–5.81)
RDW: 12.5 % (ref 11.5–15.5)
WBC: 5.9 10*3/uL (ref 4.0–10.5)

## 2016-12-15 LAB — HEMOGLOBIN A1C
HEMOGLOBIN A1C: 6.3 % — AB (ref 4.8–5.6)
Mean Plasma Glucose: 134 mg/dL

## 2016-12-15 SURGERY — LOOP RECORDER INSERTION

## 2016-12-15 SURGERY — ECHOCARDIOGRAM, TRANSESOPHAGEAL
Anesthesia: Moderate Sedation

## 2016-12-15 MED ORDER — FENTANYL CITRATE (PF) 100 MCG/2ML IJ SOLN
INTRAMUSCULAR | Status: AC
  Filled 2016-12-15: qty 2

## 2016-12-15 MED ORDER — BUTAMBEN-TETRACAINE-BENZOCAINE 2-2-14 % EX AERO
INHALATION_SPRAY | CUTANEOUS | Status: DC | PRN
  Administered 2016-12-15: 2 via TOPICAL

## 2016-12-15 MED ORDER — ATORVASTATIN CALCIUM 40 MG PO TABS: 40.0000 mg | ORAL_TABLET | Freq: Every day | ORAL | 0 refills | Status: DC

## 2016-12-15 MED ORDER — DIPHENHYDRAMINE HCL 50 MG/ML IJ SOLN
INTRAMUSCULAR | Status: AC
  Filled 2016-12-15: qty 1

## 2016-12-15 MED ORDER — LIDOCAINE-EPINEPHRINE 1 %-1:100000 IJ SOLN
INTRAMUSCULAR | Status: DC | PRN
  Administered 2016-12-15: 10 mL

## 2016-12-15 MED ORDER — FENTANYL CITRATE (PF) 100 MCG/2ML IJ SOLN
INTRAMUSCULAR | Status: DC | PRN
  Administered 2016-12-15: 25 ug via INTRAVENOUS

## 2016-12-15 MED ORDER — ASPIRIN EC 81 MG PO TBEC: 81.0000 mg | DELAYED_RELEASE_TABLET | Freq: Every day | ORAL | 2 refills | Status: AC

## 2016-12-15 MED ORDER — MIDAZOLAM HCL 5 MG/ML IJ SOLN
INTRAMUSCULAR | Status: AC
  Filled 2016-12-15: qty 2

## 2016-12-15 MED ORDER — SODIUM CHLORIDE 0.9 % IV SOLN: INTRAVENOUS | Status: DC

## 2016-12-15 MED ORDER — MIDAZOLAM HCL 10 MG/2ML IJ SOLN
INTRAMUSCULAR | Status: DC | PRN
  Administered 2016-12-15 (×2): 2 mg via INTRAVENOUS

## 2016-12-15 MED ORDER — LIDOCAINE-EPINEPHRINE 1 %-1:100000 IJ SOLN
INTRAMUSCULAR | Status: AC
  Filled 2016-12-15: qty 1

## 2016-12-15 MED ORDER — FLUTICASONE-SALMETEROL 100-50 MCG/DOSE IN AEPB: INHALATION_SPRAY | RESPIRATORY_TRACT | 2 refills | Status: DC

## 2016-12-15 SURGICAL SUPPLY — 2 items
LOOP REVEAL LINQSYS (Prosthesis & Implant Heart) ×3 IMPLANT
PACK LOOP INSERTION (CUSTOM PROCEDURE TRAY) ×3 IMPLANT

## 2016-12-15 NOTE — Consult Note (Signed)
ELECTROPHYSIOLOGY CONSULT NOTE  Patient ID: [name] MRN: [id number], DOB/AGE: 81-06-1930   Admit date: 12/13/2016 Date of Consult: 12/15/2016  Primary Physician: Timmothy Euler, MD Primary Cardiologist: Dr. Percival Spanish Reason for Consultation: Cryptogenic stroke ; recommendations regarding Implantable Loop Recorder  History of Present Illness NEDIM OKI was admitted on 12/13/2016 with L toungue and facial numbness, dx with TIA.  PMHx includes prior TIA, CVA, HTN, DM, CAD, HLD.  He first developed symptoms while with his family.  Imaging demonstrated Small R MCA infarcts in setting of previous infarcts, felt to be embolic secondary to unknown source .  he has undergone workup for stroke including echocardiogram and carotid angio .  The patient has been monitored on telemetry which has demonstrated sinus rhythm with no arrhythmias.  Inpatient stroke work-up is to be completed with a TEE.   Echocardiogram this admission demonstrated    Study Conclusions - Left ventricle: The cavity size was normal. There was moderate   concentric hypertrophy. Systolic function was normal. The   estimated ejection fraction was in the range of 60% to 65%. Wall   motion was normal; there were no regional wall motion   abnormalities. Doppler parameters are consistent with abnormal   left ventricular relaxation (grade 1 diastolic dysfunction). - Aortic valve: Valve mobility was restricted. There was very mild   stenosis. Peak velocity (S): 206 cm/s. - Mitral valve: Calcified annulus. - Left atrium: The atrium was mildly dilated. Impressions - No cardiac source of emboli was indentified.   Lab work is reviewed.   12/17/15: 30 day EM Study Highlights  NSR.  Rare atrial and ventricular ectopy.  No sustained arrhythmias.  No atrial fib. Send results to Dr. Leonie Man     Prior to admission, the patient denies chest pain, shortness of breath, dizziness, palpitations, or syncope.  They reoprts  recovered from TIA with plans to home at discharge, pert he patient today.  EP has been asked by Neurology team, Dr. Leonie Man to evaluate for placement of an implantable loop recorder to monitor for atrial fibrillation.    Past Medical History:  Diagnosis Date  . Arthritis    "wrists, knees, back" (12/10/2015)  . Asthma   . Cerebral atherosclerosis   . Chronic back pain    "lower primarily" (12/10/2015)  . CORONARY ATHEROSCLEROSIS NATIVE CORONARY ARTERY    Myocardial infarction at [facility] in 1996 with a directional atherectomy of the 90% LAD stenosis and medical management of 60-70% proximal circumflex and 50% RCA stenosis.  . DYSLIPIDEMIA   . GERD (gastroesophageal reflux disease)   . History of hiatal hernia   . HYPERTENSION   . ILIAC ARTERY ANEURYSM   . Myocardial infarction ([place]) 1996  . Stroke [facility]) 2003; 2013; 07/2015; 12/05/2015   denies residual on 12/10/2015  . TIA (transient ischemic attack)   . Type II diabetes mellitus ([place])   . UPPER RESPIRATORY INFECTION      Surgical History:  Past Surgical History:  Procedure Laterality Date  . ANAL FISSURE REPAIR  1990s  . BASAL CELL CARCINOMA EXCISION Right 12/2015   "temple"  . CATARACT EXTRACTION W/ INTRAOCULAR LENS  IMPLANT, BILATERAL Bilateral 2000s  . CORONARY ANGIOPLASTY  1996  . KNEE CARTILAGE SURGERY Right 1970s  . SHOULDER ARTHROSCOPY W/ ROTATOR CUFF REPAIR Left 2000s     Prescriptions Prior to Admission  Medication Sig Dispense Refill Last Dose  . acetaminophen (TYLENOL) 325 MG tablet Take 650 mg by mouth every 6 (six) hours  as needed for mild pain.   Past Week at Unknown time  . ADVAIR DISKUS 100-50 MCG/DOSE AEPB INHALE ONE DOSE BY MOUTH EVERY 12 HOURS AS NEEDED 180 each 2 12/12/2016 at Unknown time  . atorvastatin (LIPITOR) 40 MG tablet Take 1 tablet (40 mg total) by mouth daily. 90 tablet 3 12/13/2016 at Unknown time  . benazepril (LOTENSIN) 20 MG tablet TAKE ONE TABLET BY MOUTH ONCE DAILY 90 tablet 0 12/13/2016 at  Unknown time  . clopidogrel (PLAVIX) 75 MG tablet TAKE ONE TABLET BY MOUTH ONCE DAILY 90 tablet 3 12/13/2016 at 0700  . DIALYVITE VITAMIN D3 MAX 08144 units TABS TAKE ONE TABLET BY MOUTH ONCE A WEEK 12 tablet 0 Past Week at Unknown time  . diclofenac sodium (VOLTAREN) 1 % GEL Apply 2 g topically 4 (four) times daily. 100 g 1 12/13/2016 at Unknown time  . feeding supplement, GLUCERNA SHAKE, (GLUCERNA SHAKE) LIQD Take 237 mLs by mouth 2 (two) times daily between meals. 60 Can 11 12/13/2016 at Unknown time  . fish oil-omega-3 fatty acids 1000 MG capsule Take 2 g by mouth daily.     12/13/2016 at Unknown time  . glucose blood test strip Check blood sugar twice daily and as needed. Dx Code 100 each 12 12/13/2016 at Unknown time  . hydrALAZINE (APRESOLINE) 25 MG tablet Take 1 tablet (25 mg total) by mouth daily as needed. 90 tablet 3 12/13/2016 at Unknown time  . hydrochlorothiazide (HYDRODIURIL) 25 MG tablet TAKE ONE-HALF TABLET BY MOUTH ONCE DAILY EXCEPT ON MONDAY, WEDNESDAY AND FRIDAY TAKE A WHOLE TABLET 90 tablet 3 12/13/2016 at Unknown time  . metFORMIN (GLUCOPHAGE-XR) 500 MG 24 hr tablet TAKE ONE TABLET BY MOUTH TWICE DAILY 180 tablet 3 12/13/2016 at Unknown time  . metoprolol (LOPRESSOR) 100 MG tablet TAKE ONE TABLET BY MOUTH TWICE DAILY 180 tablet 3 12/13/2016 at 0700  . nitroGLYCERIN (NITROSTAT) 0.4 MG SL tablet Place 0.4 mg under the tongue every 5 (five) minutes as needed for chest pain.   unk at [employer]  . pantoprazole (PROTONIX) 40 MG tablet Take 1 tablet (40 mg total) by mouth daily. 30 tablet 11 12/13/2016 at Unknown time  . PROAIR HFA 108 (90 Base) MCG/ACT inhaler INHALE TWO PUFFS BY MOUTH 4 TIMES DAILY AS NEEDED 9 each 2 rescue at rescue  . rosuvastatin (CRESTOR) 10 MG tablet Take 10 mg by mouth daily.   12/12/2016 at Unknown time    Inpatient Medications:  . [MAR Hold] aspirin  325 mg Oral Daily  . [MAR Hold] atorvastatin  40 mg Oral Daily  . [MAR Hold] clopidogrel  75 mg Oral Daily  . [MAR Hold]  feeding supplement (GLUCERNA SHAKE)  237 mL Oral TID BM  . [MAR Hold] heparin  5,000 Units Subcutaneous Q8H  . [MAR Hold] mometasone-formoterol  2 puff Inhalation BID  . [MAR Hold] omega-3 acid ethyl esters  2 g Oral Daily  . [MAR Hold] pantoprazole  40 mg Oral Daily    Allergies:  Allergies  Allergen Reactions  . Codeine Other (See Comments)    Bad dreams  . Lopid [Gemfibrozil] Nausea Only  . Voltaren [Diclofenac Sodium] Diarrhea    Social History   Social History  . Marital status: Married    Spouse name: N/A  . Number of children: N/A  . Years of education: N/A   Occupational History  . 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 Smoking status: Former Smoker  Packs/day: 0.12    Years: 45.00    Types: Cigarettes    Quit date: 11/24/1984  . Smokeless tobacco: Former Systems developer    Types: Snuff     Comment: "dipped snuff when I was a boy"  . Alcohol use No  . Drug use: No  . Sexual activity: No   Other Topics Concern  . Not on file   Social History Narrative  . No narrative on file     Family History  Problem Relation Age of Onset  . Coronary artery disease Mother   . Cancer Father        lung  . Stroke Father   . Cancer Brother        lung  . Stroke Sister       Review of Systems: All other systems reviewed and are otherwise negative except as noted above.  Physical Exam: Vitals:   12/15/16 0118 12/15/16 0500 12/15/16 0927 12/15/16 1014  BP: (!) 169/78 139/82  (!) 178/81  Pulse: 77 79  90  Resp: 18 18  16   Temp: 98 F (36.7 C) 97.6 F (36.4 C)  97.6 F (36.4 C)  TempSrc: Oral Oral  Oral  SpO2: 95% 96% 96% 96%  Weight:    190 lb (86.2 kg)  Height:    5\' 11"  (1.803 m)    GEN- The patient is well appearing, alert and oriented x 3 today.   Head- normocephalic, atraumatic Eyes-  Sclera clear, conjunctiva pink Ears- hearing intact Oropharynx- clear Neck- supple Lungs- CTA b/l, normal work of breathing Heart-RRR, no murmurs,  rubs or gallops  GI- soft, NT, ND Extremities- no clubbing, cyanosis, or edema MS- no significant deformity, age appropriate atrophy Skin- no rash or lesion Psych- euthymic mood, full affect   Labs:   Lab Results  Component Value Date   WBC 5.9 12/15/2016   HGB 13.7 12/15/2016   HCT 38.9 (L) 12/15/2016   MCV 92.6 12/15/2016   PLT 163 12/15/2016    Recent Labs Lab 12/13/16 1732  12/15/16 0806  NA 127*  < > 134*  K 3.5  < > 3.8  CL 90*  < > 100*  CO2 26  < > 24  BUN 12  < > 9  CREATININE 1.46*  < > 1.08  CALCIUM 9.4  < > 9.0  PROT 6.9  --   --   BILITOT 1.4*  --   --   ALKPHOS 65  --   --   ALT 18  --   --   AST 28  --   --   GLUCOSE 160*  < > 152*  < > = values in this interval not displayed. No results found for: CKTOTAL, CKMB, CKMBINDEX, TROPONINI Lab Results  Component Value Date   CHOL 85 12/14/2016   CHOL 113 11/05/2016   CHOL 115 12/06/2015   Lab Results  Component Value Date   HDL 30 (L) 12/14/2016   HDL 41 11/05/2016   HDL 36 (L) 12/06/2015   Lab Results  Component Value Date   LDLCALC 27 12/14/2016   LDLCALC 49 11/05/2016   LDLCALC 32 12/06/2015   Lab Results  Component Value Date   TRIG 139 12/14/2016   TRIG 117 11/05/2016   TRIG 233 (H) 12/06/2015   Lab Results  Component Value Date   CHOLHDL 2.8 12/14/2016   CHOLHDL 2.8 11/05/2016   CHOLHDL 3.2 12/06/2015      Radiology/Studies:  Ct Head Wo Contrast Result Date:  12/13/2016 CLINICAL DATA:  Left tongue numbness. EXAM: CT HEAD WITHOUT CONTRAST TECHNIQUE: Contiguous axial images were obtained from the base of the skull through the vertex without intravenous contrast. COMPARISON:  Head CT and brain MRI 12/10/2015 FINDINGS: Brain: Stable degree of atrophy and chronic small vessel ischemia. No evidence of acute infarct or large vessel ischemia. No mass effect, midline shift, hydrocephalus or extra-axial fluid collection. Vascular: Atherosclerosis of skullbase vasculature without hyperdense  vessel or abnormal calcification. Skull: Normal. Negative for fracture or focal lesion. Sinuses/Orbits: Paranasal sinuses and mastoid air cells are clear. The visualized orbits are unremarkable. Bilateral cataract resection. Other: None. IMPRESSION: Similar degree of atrophy and chronic small vessel ischemia. No evidence of acute intracranial abnormality. Electronically Signed   By: Jeb Levering M.D.   On: 12/13/2016 21:09   Mr Jodene Nam Neck W Wo Contrast Result Date: 12/14/2016 CLINICAL DATA:  83 y/o M; transient left tongue numbness, left facial droop, and speech changes. EXAM: MR HEAD WITHOUT CONTRAST MRA HEAD WITHOUT CONTRAST MRA OF THE NECK WITHOUT AND WITH CONTRAST TECHNIQUE: Multiplanar, multiecho pulse sequences of the brain and surrounding structures were obtained without intravenous contrast. Angiographic images of the neck were obtained using MRA technique without and with intravenous contrast. Angiographic images of the head were obtained using MRA technique without intravenous contrast. CONTRAST:  8 cc MultiHance. COMPARISON:  12/13/2016 CT head. FINDINGS: MR HEAD FINDINGS Brain: 5 foci of reduced diffusion measuring up to 6 mm are present in right anterior caudate body, right posterior frontal cortex, right frontal operculum, right posterolateral temporal cortex, and right medial temporal cortex. Small chronic lacunar infarcts are present in bilateral cerebellar hemispheres, right pons, and bilateral lentiform nuclei. Foci of T2 FLAIR hyperintense signal abnormality in subcortical and periventricular white matter are compatible with moderate chronic microvascular ischemic changes and there is moderate brain parenchymal volume loss. No abnormal susceptibility hypointensity to indicate intracranial hemorrhage. No hydrocephalus, extra-axial collection, or focal mass effect. Vascular: As below. Skull and upper cervical spine: Normal marrow signal. Sinuses/Orbits: Negative. Other: Bilateral intra-ocular  lens replacement. MRA HEAD FINDINGS Internal carotid arteries:  Patent. Anterior cerebral arteries:  Patent. Middle cerebral arteries: Patent. Anterior communicating artery: Possible diminutive anterior communicating artery. Posterior communicating arteries:  Patent. Posterior cerebral arteries: Patent. Large right posterior communicating artery and small right P1 segment compatible persistent fetal circulation. Moderate stenosis of proximal left P1 segment. Basilar artery:  Patent. Vertebral arteries:  Patent. No evidence of high-grade stenosis, large vessel occlusion, or aneurysm identified. MRA NECK FINDINGS Motion degraded study, suboptimal evaluation for subtle stenosis or aneurysm. Aortic arch: Patent. Right common carotid artery: Patent. Right internal carotid artery: Patent. Mild irregularity of the proximal ICA and carotid bifurcation with minimal stenosis is likely due to atherosclerosis. Possible plaque ulceration of right proximal ICA (series [id number], image 63). Right vertebral artery: Patent. Left common carotid artery: Patent. Left Internal carotid artery: Patent. Mild irregularity of proximal ICA carotid bifurcation without significant stenosis. Left Vertebral artery: Patent. There is no evidence of high-grade stenosis, occlusion, or aneurysm identified. IMPRESSION: 1. Several small (less than 6 mm) foci of acute infarction are present in the right MCA distribution. No acute hemorrhage. 2. Moderate chronic microvascular ischemic changes and parenchymal volume loss of the brain. 3. Patent circle of Willis. No large vessel occlusion, aneurysm, or significant stenosis is identified. 4. Motion degraded MRA of the neck, suboptimal evaluation for subtle stenosis or aneurysm. 5. Patent carotid and vertebral arteries. No large vessel occlusion or high-grade stenosis is identified. 6.  Stable probable plaque ulceration of the right carotid bulb. These results will be called to the ordering clinician or  representative by the Radiologist Assistant, and communication documented in the PACS or zVision Dashboard. Electronically Signed   By: Kristine Garbe M.D.   On: 12/14/2016 05:34     12-lead ECG SR All prior EKG's in EPIC reviewed with no documented atrial fibrillation Telemetry SR  Assessment and Plan:  1. Cryptogenic stroke The patient presents with cryptogenic stroke.  The patient has a TEE planned for this AM.  I spoke at length with the patient about monitoring for afib with either a 30 day event monitor or an implantable loop recorder.  Risks, benefits, and alteratives to implantable loop recorder were discussed with the patient today.   At this time, the patient is very clear in their decision to proceed with implantable loop recorder.   Wound care was reviewed with the patient (keep incision clean and dry for 3 days).  Wound check will be scheduled for the patient  Please call with questions.   Renee Dyane Dustman, PA-C 12/15/2016   I have seen, examined the patient, and reviewed the above assessment and plan.  On exam, RRR.  Changes to above are made where necessary.  He has previously worn event monitor which was unrevealing.  Risks of ILR discussed with patient and spouse who wish to proceed at this time.  Co Sign: Thompson Grayer, MD 12/15/2016 3:49 PM

## 2016-12-15 NOTE — CV Procedure (Signed)
    Transesophageal Echocardiogram Note  [name] [id number] 1929/08/14  Procedure: Transesophageal Echocardiogram Indications: CVA   Procedure Details Consent: Obtained Time Out: Verified patient identification, verified procedure, site/side was marked, verified correct patient position, special equipment/implants available, Radiology Safety Procedures followed,  medications/allergies/relevent history reviewed, required imaging and test results available.  Performed  Medications:  During this procedure the patient is administered a total of Versed 4 mg and Fentanyl 25 mcg  to achieve and maintain moderate conscious sedation.  The patient's heart rate, blood pressure, and oxygen saturation are monitored continuously during the procedure. The period of conscious sedation is 30 minutes, of which I was present face-to-face 100% of this time.  Normal LV function; moderate LAE; no LAA thrombus; calcified aortic valve with reduced cusp excursion; atrial septal aneurysm; positive saline microcavitation study c/w PFO.  Complications: No apparent complications Patient did tolerate procedure well.  Kirk Ruths, MD

## 2016-12-15 NOTE — Progress Notes (Signed)
STROKE TEAM PROGRESS NOTE   HISTORY OF PRESENT ILLNESS (per record) [name] is a 83 y.o. male who reports that he experienced transient left tongue numbness. His family who were with him noticed that his left face drooped and his speech became thick. This has happened twice today. It is currently completely back to baseline. It happened also last Thursday. He has a history of multiple strokes, but has not had one since at least last a year. He is currently at baseline. Patient was not administered IV t-PA secondary to resolution of symptoms. He was admitted for further evaluation and treatment.   SUBJECTIVE (INTERVAL HISTORY) He presented with transient numbness of the left border of his tongue and facial droop and slurred speech which is improving. He has history of multiple strokes in the past which were felt to be small vessel disease and he has been on aspirin, Aggrenox, aspirin and Plavix and more recently Plavix alone. His wife and daughter are at the bedside   OBJECTIVE Temp:  [97.5 F (36.4 C)-98.7 F (37.1 C)] 97.6 F (36.4 C) (05/15 0500) Pulse Rate:  [70-79] 79 (05/15 0500) Cardiac Rhythm: Normal sinus rhythm (05/15 0700) Resp:  [18-19] 18 (05/15 0500) BP: (99-169)/(53-100) 139/82 (05/15 0500) SpO2:  [92 %-98 %] 96 % (05/15 0500)  CBC:  Recent Labs Lab 12/13/16 1732 12/13/16 1738 12/15/16 0806  WBC 8.3  --  5.9  NEUTROABS 5.5  --   --   HGB 14.8 14.6 13.7  HCT 41.9 43.0 38.9*  MCV 91.5  --  92.6  PLT 201  --  628    Basic Metabolic Panel:  Recent Labs Lab 12/13/16 1732 12/13/16 1738 12/14/16 0509 12/14/16 1130  NA 127* 130* 130*  --   K 3.5 3.5 3.1*  --   CL 90* 87* 94*  --   CO2 26  --  26  --   GLUCOSE 160* 152* 128*  --   BUN 12 14 11   --   CREATININE 1.46* 1.30* 1.09  --   CALCIUM 9.4  --  8.8*  --   MG  --   --   --  1.6*    Lipid Panel:    Component Value Date/Time   CHOL 85 12/14/2016 0509   CHOL 113 11/05/2016 0845   CHOL 132  02/02/2013 0853   TRIG 139 12/14/2016 0509   TRIG 123 02/08/2015 1009   TRIG 172 (H) 02/02/2013 0853   HDL 30 (L) 12/14/2016 0509   HDL 41 11/05/2016 0845   HDL 41 02/08/2015 1009   HDL 33 (L) 02/02/2013 0853   CHOLHDL 2.8 12/14/2016 0509   VLDL 28 12/14/2016 0509   LDLCALC 27 12/14/2016 0509   LDLCALC 49 11/05/2016 0845   LDLCALC 49 05/02/2014 0934   LDLCALC 65 02/02/2013 0853   HgbA1c:  Lab Results  Component Value Date   HGBA1C 6.3 (H) 12/14/2016   Urine Drug Screen:    Component Value Date/Time   LABOPIA NONE DETECTED 12/13/2016 2045   COCAINSCRNUR NONE DETECTED 12/13/2016 2045   LABBENZ NONE DETECTED 12/13/2016 2045   AMPHETMU NONE DETECTED 12/13/2016 2045   THCU NONE DETECTED 12/13/2016 2045   LABBARB NONE DETECTED 12/13/2016 2045    Alcohol Level     Component Value Date/Time   ETH <5 12/13/2016 1732    IMAGING  Ct Head Wo Contrast Date: 12/13/2016 Similar degree of atrophy and chronic small vessel ischemia. No evidence of acute intracranial abnormality.   Mr  Brain Wo Contrast Mr [name] Head/brain WG Cm Mr [name] Neck W Wo Contrast 12/14/2016 1. Several small (less than 6 mm) foci of acute infarction are present in the right MCA distribution. No acute hemorrhage. 2. Moderate chronic microvascular ischemic changes and parenchymal volume loss of the brain. 3. Patent circle of Willis. No large vessel occlusion, aneurysm, or significant stenosis is identified. 4. Motion degraded MRA of the neck, suboptimal evaluation for subtle stenosis or aneurysm. 5. Patent carotid and vertebral arteries. No large vessel occlusion or high-grade stenosis is identified. 6. Stable probable plaque ulceration of the right carotid bulb.   2D Echocardiogram  - Left ventricle: The cavity size was normal. There was moderate concentric hypertrophy. Systolic function was normal. The estimated ejection fraction was in the range of 60% to 65%. Wall motion was normal; there were no regional wall motion  abnormalities. Doppler parameters are consistent with abnormal left ventricular relaxation (grade 1 diastolic dysfunction). - Aortic valve: Valve mobility was restricted. There was very mild stenosis. Peak velocity (S): 206 cm/s. - Mitral valve: Calcified annulus. - Left atrium: The atrium was mildly dilated. Impressions:  No cardiac source of emboli was indentified.   PHYSICAL EXAM Pleasant elderly Caucasian male currently not in distress. . Afebrile. Head is nontraumatic. Neck is supple without bruit.    Cardiac exam no murmur or gallop. Lungs are clear to auscultation. Distal pulses are well felt. Neurological Exam ;  Awake alert oriented 3 with normal language. Mild dysarthria. Mild left lower facial asymmetry. Pupils equal reactive. Fundi were not visualized. Vision acuity seems adequate. Tongue is midline. Hearing appears normal. Motor system exam reveals no upper or lower extremity drift. Symmetric and equal strength in all 4 extremities. Deep tendon reflexes are symmetric. Plantars are downgoing. Gait was not tested.   ASSESSMENT/PLAN [name] is a 83 y.o. male with history of previous stroke and TIA, CAD, HTN. HLD and DB presenting with transient L tongue numbness. He did not receive IV t-PA due to resolved symptoms.   Stroke:   Small R MCA infarcts in setting of previous infarcts, felt to be embolic secondary to unknown source  CT head no acute stroke. Atrophy. small vessel disease.   MRI  Several small R MCA infarcts. small vessel disease. Atrophy.   MRA H&N  No LVO. Stable R ICA bulb ulcerated plaque  2D Echo  EF 60-65%. No source of embolus   TEE to look for embolic source. Arranged with [facility] for today. Pt NPO since MN. If positive for PFO (patent foramen ovale), check bilateral lower extremity venous dopplers to rule out DVT as possible source of stroke.   If TEE negative, a [place] electrophysiologist will  consult and consider placement of an implantable loop recorder to evaluate for atrial fibrillation as etiology of stroke. This has been explained to patient/family by Dr. Leonie Ortega and they are agreeable.   LDL 49  HgbA1c 6.3  Heparin 5000 units sq tid for VTE prophylaxis  Diet NPO time specified  clopidogrel 75 mg daily prior to admission, now on aspirin 81 mg daily and clopidogrel 75 mg daily   Patient counseled to be compliant with his antithrombotic medications  Ongoing aggressive stroke risk factor management  Therapy recommendations:  HH PT and OT  Disposition:  pending   Hypertension  Stable  Periods of hypotension 99/53 Permissive hypertension (OK if < 220/120) but gradually normalize in 5-7 days Long-term BP goal normotensive  Hyperlipidemia  Home meds:  lipitor 40, resumed in hospital  LDL 49, goal < 70  Continue statin at discharge  Diabetes type II  HgbA1c 6.3, goal < 7.0  Controlled  Other Stroke Risk Factors  Advanced age  Former Cigarette smoker  Former smokeless tobacco user  UDS / ETOH level negative   Hx stroke/TIA  12/2015 tiny L frontal lacune. Continued plavix  07/2015 L internal capsule infarct s/p IV tPA secondary to small vessel disease. Changed aggrenox to plavix  2003 aphasia, improved [name])  Family hx stroke (father, sister)  Coronary artery disease, MI  Other Active Problems  R iliac artery aneurysm  Hospital day # 2  I have personally examined this patient, reviewed notes, independently viewed imaging studies, participated in medical decision making and plan of care.ROS completed by me personally and pertinent positives fully documented  I have made any additions or clarifications directly to the above note. The patient has presented with multiple small infarcts possibly embolic of cryptogenic etiology. He said to priors admissions for lacunar infarcts in workup has been unyielding. I recommend the check transesophageal  echocardiogram and if negative implanted loop recorder for monitoring for paroxysmal atrial fibrillation. Add aspirin 81 mg to Plavix 75 mg daily for 3 months. I had a long discussion with the patient, wife and daughter at the bedside and answered questions. Greater than 50% time during this 35 minute visit was spent on counseling and coordination of care about his recurrent strokes, discussion about evaluation and treatment plan and answering questions. Discussed with Dr. Foye Deer, [place] Pager: [phone] 12/15/2016 11:57 AM   To contact Stroke Continuity provider, please refer to [email or web address] After hours, contact General Neurology

## 2016-12-15 NOTE — Progress Notes (Signed)
PT Cancellation Note  Patient Details Name: [name] MRN: [id number] DOB: [birth year]   Cancelled Treatment:    Reason Eval/Treat Not Completed: Patient at procedure or test/unavailable. Will check back as schedule allows to continue with PT POC.    Thelma Comp 12/15/2016, 10:41 AM   Rolinda Roan, PT, DPT Acute Rehabilitation Services Pager: [phone]

## 2016-12-15 NOTE — H&P (View-Only) (Signed)
ELECTROPHYSIOLOGY CONSULT NOTE  Patient ID: [name] MRN: [id number], DOB/AGE: 81-Sep-1931   Admit date: 12/13/2016 Date of Consult: 12/15/2016  Primary Physician: Timmothy Euler, MD Primary Cardiologist: Dr. Percival Spanish Reason for Consultation: Cryptogenic stroke ; recommendations regarding Implantable Loop Recorder  History of Present Illness [name] was admitted on 12/13/2016 with L toungue and facial numbness, dx with TIA.  PMHx includes prior TIA, CVA, HTN, DM, CAD, HLD.  He first developed symptoms while with his family.  Imaging demonstrated Small R MCA infarcts in setting of previous infarcts, felt to be embolic secondary to unknown source .  he has undergone workup for stroke including echocardiogram and carotid angio .  The patient has been monitored on telemetry which has demonstrated sinus rhythm with no arrhythmias.  Inpatient stroke work-up is to be completed with a TEE.   Echocardiogram this admission demonstrated    Study Conclusions - Left ventricle: The cavity size was normal. There was moderate   concentric hypertrophy. Systolic function was normal. The   estimated ejection fraction was in the range of 60% to 65%. Wall   motion was normal; there were no regional wall motion   abnormalities. Doppler parameters are consistent with abnormal   left ventricular relaxation (grade 1 diastolic dysfunction). - Aortic valve: Valve mobility was restricted. There was very mild   stenosis. Peak velocity (S): 206 cm/s. - Mitral valve: Calcified annulus. - Left atrium: The atrium was mildly dilated. Impressions - No cardiac source of emboli was indentified.   Lab work is reviewed.   12/17/15: 30 day EM Study Highlights  NSR.  Rare atrial and ventricular ectopy.  No sustained arrhythmias.  No atrial fib. Send results to Dr. Leonie Man     Prior to admission, the patient denies chest pain, shortness of breath, dizziness, palpitations, or syncope.  They reoprts  recovered from TIA with plans to home at discharge, pert he patient today.  EP has been asked by Neurology team, Dr. Leonie Man to evaluate for placement of an implantable loop recorder to monitor for atrial fibrillation.    Past Medical History:  Diagnosis Date  . Arthritis    "wrists, knees, back" (12/10/2015)  . Asthma   . Cerebral atherosclerosis   . Chronic back pain    "lower primarily" (12/10/2015)  . CORONARY ATHEROSCLEROSIS NATIVE CORONARY ARTERY    Myocardial infarction at [facility] in 1996 with a directional atherectomy of the 90% LAD stenosis and medical management of 60-70% proximal circumflex and 50% RCA stenosis.  . DYSLIPIDEMIA   . GERD (gastroesophageal reflux disease)   . History of hiatal hernia   . HYPERTENSION   . ILIAC ARTERY ANEURYSM   . Myocardial infarction ([place]) 1996  . Stroke [facility]) 2003; 2013; 07/2015; 12/05/2015   denies residual on 12/10/2015  . TIA (transient ischemic attack)   . Type II diabetes mellitus ([place])   . UPPER RESPIRATORY INFECTION      Surgical History:  Past Surgical History:  Procedure Laterality Date  . ANAL FISSURE REPAIR  1990s  . BASAL CELL CARCINOMA EXCISION Right 12/2015   "temple"  . CATARACT EXTRACTION W/ INTRAOCULAR LENS  IMPLANT, BILATERAL Bilateral 2000s  . CORONARY ANGIOPLASTY  1996  . KNEE CARTILAGE SURGERY Right 1970s  . SHOULDER ARTHROSCOPY W/ ROTATOR CUFF REPAIR Left 2000s     Prescriptions Prior to Admission  Medication Sig Dispense Refill Last Dose  . acetaminophen (TYLENOL) 325 MG tablet Take 650 mg by mouth every 6 (six) hours  as needed for mild pain.   Past Week at Unknown time  . ADVAIR DISKUS 100-50 MCG/DOSE AEPB INHALE ONE DOSE BY MOUTH EVERY 12 HOURS AS NEEDED 180 each 2 12/12/2016 at Unknown time  . atorvastatin (LIPITOR) 40 MG tablet Take 1 tablet (40 mg total) by mouth daily. 90 tablet 3 12/13/2016 at Unknown time  . benazepril (LOTENSIN) 20 MG tablet TAKE ONE TABLET BY MOUTH ONCE DAILY 90 tablet 0 12/13/2016 at  Unknown time  . clopidogrel (PLAVIX) 75 MG tablet TAKE ONE TABLET BY MOUTH ONCE DAILY 90 tablet 3 12/13/2016 at 0700  . DIALYVITE VITAMIN D3 MAX 86578 units TABS TAKE ONE TABLET BY MOUTH ONCE A WEEK 12 tablet 0 Past Week at Unknown time  . diclofenac sodium (VOLTAREN) 1 % GEL Apply 2 g topically 4 (four) times daily. 100 g 1 12/13/2016 at Unknown time  . feeding supplement, GLUCERNA SHAKE, (GLUCERNA SHAKE) LIQD Take 237 mLs by mouth 2 (two) times daily between meals. 60 Can 11 12/13/2016 at Unknown time  . fish oil-omega-3 fatty acids 1000 MG capsule Take 2 g by mouth daily.     12/13/2016 at Unknown time  . glucose blood test strip Check blood sugar twice daily and as needed. Dx Code 100 each 12 12/13/2016 at Unknown time  . hydrALAZINE (APRESOLINE) 25 MG tablet Take 1 tablet (25 mg total) by mouth daily as needed. 90 tablet 3 12/13/2016 at Unknown time  . hydrochlorothiazide (HYDRODIURIL) 25 MG tablet TAKE ONE-HALF TABLET BY MOUTH ONCE DAILY EXCEPT ON MONDAY, WEDNESDAY AND FRIDAY TAKE A WHOLE TABLET 90 tablet 3 12/13/2016 at Unknown time  . metFORMIN (GLUCOPHAGE-XR) 500 MG 24 hr tablet TAKE ONE TABLET BY MOUTH TWICE DAILY 180 tablet 3 12/13/2016 at Unknown time  . metoprolol (LOPRESSOR) 100 MG tablet TAKE ONE TABLET BY MOUTH TWICE DAILY 180 tablet 3 12/13/2016 at 0700  . nitroGLYCERIN (NITROSTAT) 0.4 MG SL tablet Place 0.4 mg under the tongue every 5 (five) minutes as needed for chest pain.   unk at [employer]  . pantoprazole (PROTONIX) 40 MG tablet Take 1 tablet (40 mg total) by mouth daily. 30 tablet 11 12/13/2016 at Unknown time  . PROAIR HFA 108 (90 Base) MCG/ACT inhaler INHALE TWO PUFFS BY MOUTH 4 TIMES DAILY AS NEEDED 9 each 2 rescue at rescue  . rosuvastatin (CRESTOR) 10 MG tablet Take 10 mg by mouth daily.   12/12/2016 at Unknown time    Inpatient Medications:  . [MAR Hold] aspirin  325 mg Oral Daily  . [MAR Hold] atorvastatin  40 mg Oral Daily  . [MAR Hold] clopidogrel  75 mg Oral Daily  . [MAR Hold]  feeding supplement (GLUCERNA SHAKE)  237 mL Oral TID BM  . [MAR Hold] heparin  5,000 Units Subcutaneous Q8H  . [MAR Hold] mometasone-formoterol  2 puff Inhalation BID  . [MAR Hold] omega-3 acid ethyl esters  2 g Oral Daily  . [MAR Hold] pantoprazole  40 mg Oral Daily    Allergies:  Allergies  Allergen Reactions  . Codeine Other (See Comments)    Bad dreams  . Lopid [Gemfibrozil] Nausea Only  . Voltaren [Diclofenac Sodium] Diarrhea    Social History   Social History  . Marital status: Married    Spouse name: N/A  . Number of children: N/A  . Years of education: N/A   Occupational History  . 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 Smoking status: Former Smoker  Packs/day: 0.12    Years: 45.00    Types: Cigarettes    Quit date: 11/24/1984  . Smokeless tobacco: Former Systems developer    Types: Snuff     Comment: "dipped snuff when I was a boy"  . Alcohol use No  . Drug use: No  . Sexual activity: No   Other Topics Concern  . Not on file   Social History Narrative  . No narrative on file     Family History  Problem Relation Age of Onset  . Coronary artery disease Mother   . Cancer Father        lung  . Stroke Father   . Cancer Brother        lung  . Stroke Sister       Review of Systems: All other systems reviewed and are otherwise negative except as noted above.  Physical Exam: Vitals:   12/15/16 0118 12/15/16 0500 12/15/16 0927 12/15/16 1014  BP: (!) 169/78 139/82  (!) 178/81  Pulse: 77 79  90  Resp: 18 18  16   Temp: 98 F (36.7 C) 97.6 F (36.4 C)  97.6 F (36.4 C)  TempSrc: Oral Oral  Oral  SpO2: 95% 96% 96% 96%  Weight:    190 lb (86.2 kg)  Height:    5\' 11"  (1.803 m)    GEN- The patient is well appearing, alert and oriented x 3 today.   Head- normocephalic, atraumatic Eyes-  Sclera clear, conjunctiva pink Ears- hearing intact Oropharynx- clear Neck- supple Lungs- CTA b/l, normal work of breathing Heart-RRR, no murmurs,  rubs or gallops  GI- soft, NT, ND Extremities- no clubbing, cyanosis, or edema MS- no significant deformity, age appropriate atrophy Skin- no rash or lesion Psych- euthymic mood, full affect   Labs:   Lab Results  Component Value Date   WBC 5.9 12/15/2016   HGB 13.7 12/15/2016   HCT 38.9 (L) 12/15/2016   MCV 92.6 12/15/2016   PLT 163 12/15/2016    Recent Labs Lab 12/13/16 1732  12/15/16 0806  NA 127*  < > 134*  K 3.5  < > 3.8  CL 90*  < > 100*  CO2 26  < > 24  BUN 12  < > 9  CREATININE 1.46*  < > 1.08  CALCIUM 9.4  < > 9.0  PROT 6.9  --   --   BILITOT 1.4*  --   --   ALKPHOS 65  --   --   ALT 18  --   --   AST 28  --   --   GLUCOSE 160*  < > 152*  < > = values in this interval not displayed. No results found for: CKTOTAL, CKMB, CKMBINDEX, TROPONINI Lab Results  Component Value Date   CHOL 85 12/14/2016   CHOL 113 11/05/2016   CHOL 115 12/06/2015   Lab Results  Component Value Date   HDL 30 (L) 12/14/2016   HDL 41 11/05/2016   HDL 36 (L) 12/06/2015   Lab Results  Component Value Date   LDLCALC 27 12/14/2016   LDLCALC 49 11/05/2016   LDLCALC 32 12/06/2015   Lab Results  Component Value Date   TRIG 139 12/14/2016   TRIG 117 11/05/2016   TRIG 233 (H) 12/06/2015   Lab Results  Component Value Date   CHOLHDL 2.8 12/14/2016   CHOLHDL 2.8 11/05/2016   CHOLHDL 3.2 12/06/2015      Radiology/Studies:  Ct Head Wo Contrast Result Date:  12/13/2016 CLINICAL DATA:  Left tongue numbness. EXAM: CT HEAD WITHOUT CONTRAST TECHNIQUE: Contiguous axial images were obtained from the base of the skull through the vertex without intravenous contrast. COMPARISON:  Head CT and brain MRI 12/10/2015 FINDINGS: Brain: Stable degree of atrophy and chronic small vessel ischemia. No evidence of acute infarct or large vessel ischemia. No mass effect, midline shift, hydrocephalus or extra-axial fluid collection. Vascular: Atherosclerosis of skullbase vasculature without hyperdense  vessel or abnormal calcification. Skull: Normal. Negative for fracture or focal lesion. Sinuses/Orbits: Paranasal sinuses and mastoid air cells are clear. The visualized orbits are unremarkable. Bilateral cataract resection. Other: None. IMPRESSION: Similar degree of atrophy and chronic small vessel ischemia. No evidence of acute intracranial abnormality. Electronically Signed   By: Jeb Levering M.D.   On: 12/13/2016 21:09   Mr Jodene Nam Neck W Wo Contrast Result Date: 12/14/2016 CLINICAL DATA:  83 y/o M; transient left tongue numbness, left facial droop, and speech changes. EXAM: MR HEAD WITHOUT CONTRAST MRA HEAD WITHOUT CONTRAST MRA OF THE NECK WITHOUT AND WITH CONTRAST TECHNIQUE: Multiplanar, multiecho pulse sequences of the brain and surrounding structures were obtained without intravenous contrast. Angiographic images of the neck were obtained using MRA technique without and with intravenous contrast. Angiographic images of the head were obtained using MRA technique without intravenous contrast. CONTRAST:  8 cc MultiHance. COMPARISON:  12/13/2016 CT head. FINDINGS: MR HEAD FINDINGS Brain: 5 foci of reduced diffusion measuring up to 6 mm are present in right anterior caudate body, right posterior frontal cortex, right frontal operculum, right posterolateral temporal cortex, and right medial temporal cortex. Small chronic lacunar infarcts are present in bilateral cerebellar hemispheres, right pons, and bilateral lentiform nuclei. Foci of T2 FLAIR hyperintense signal abnormality in subcortical and periventricular white matter are compatible with moderate chronic microvascular ischemic changes and there is moderate brain parenchymal volume loss. No abnormal susceptibility hypointensity to indicate intracranial hemorrhage. No hydrocephalus, extra-axial collection, or focal mass effect. Vascular: As below. Skull and upper cervical spine: Normal marrow signal. Sinuses/Orbits: Negative. Other: Bilateral intra-ocular  lens replacement. MRA HEAD FINDINGS Internal carotid arteries:  Patent. Anterior cerebral arteries:  Patent. Middle cerebral arteries: Patent. Anterior communicating artery: Possible diminutive anterior communicating artery. Posterior communicating arteries:  Patent. Posterior cerebral arteries: Patent. Large right posterior communicating artery and small right P1 segment compatible persistent fetal circulation. Moderate stenosis of proximal left P1 segment. Basilar artery:  Patent. Vertebral arteries:  Patent. No evidence of high-grade stenosis, large vessel occlusion, or aneurysm identified. MRA NECK FINDINGS Motion degraded study, suboptimal evaluation for subtle stenosis or aneurysm. Aortic arch: Patent. Right common carotid artery: Patent. Right internal carotid artery: Patent. Mild irregularity of the proximal ICA and carotid bifurcation with minimal stenosis is likely due to atherosclerosis. Possible plaque ulceration of right proximal ICA (series [id number], image 63). Right vertebral artery: Patent. Left common carotid artery: Patent. Left Internal carotid artery: Patent. Mild irregularity of proximal ICA carotid bifurcation without significant stenosis. Left Vertebral artery: Patent. There is no evidence of high-grade stenosis, occlusion, or aneurysm identified. IMPRESSION: 1. Several small (less than 6 mm) foci of acute infarction are present in the right MCA distribution. No acute hemorrhage. 2. Moderate chronic microvascular ischemic changes and parenchymal volume loss of the brain. 3. Patent circle of Willis. No large vessel occlusion, aneurysm, or significant stenosis is identified. 4. Motion degraded MRA of the neck, suboptimal evaluation for subtle stenosis or aneurysm. 5. Patent carotid and vertebral arteries. No large vessel occlusion or high-grade stenosis is identified. 6.  Stable probable plaque ulceration of the right carotid bulb. These results will be called to the ordering clinician or  representative by the Radiologist Assistant, and communication documented in the PACS or zVision Dashboard. Electronically Signed   By: Kristine Garbe M.D.   On: 12/14/2016 05:34     12-lead ECG SR All prior EKG's in EPIC reviewed with no documented atrial fibrillation Telemetry SR  Assessment and Plan:  1. Cryptogenic stroke The patient presents with cryptogenic stroke.  The patient has a TEE planned for this AM.  I spoke at length with the patient about monitoring for afib with either a 30 day event monitor or an implantable loop recorder.  Risks, benefits, and alteratives to implantable loop recorder were discussed with the patient today.   At this time, the patient is very clear in their decision to proceed with implantable loop recorder.   Wound care was reviewed with the patient (keep incision clean and dry for 3 days).  Wound check will be scheduled for the patient  Please call with questions.   Renee Dyane Dustman, PA-C 12/15/2016   I have seen, examined the patient, and reviewed the above assessment and plan.  On exam, RRR.  Changes to above are made where necessary.  He has previously worn event monitor which was unrevealing.  Risks of ILR discussed with patient and spouse who wish to proceed at this time.  Co Sign: Thompson Grayer, MD 12/15/2016 3:49 PM

## 2016-12-15 NOTE — Interval H&P Note (Signed)
History and Physical Interval Note:  12/15/2016 3:50 PM  [name]  has presented today for surgery, with the diagnosis of stroke  The various methods of treatment have been discussed with the patient and family. After consideration of risks, benefits and other options for treatment, the patient has consented to  Procedure(s): Loop Recorder Insertion (N/A) as a surgical intervention .  The patient's history has been reviewed, patient examined, no change in status, stable for surgery.  I have reviewed the patient's chart and labs.  Questions were answered to the patient's satisfaction.     Thompson Grayer

## 2016-12-15 NOTE — Progress Notes (Signed)
    [facility] has been requested to perform a transesophageal echocardiogram on [name] for stroke.  After careful review of history and examination, the risks and benefits of transesophageal echocardiogram have been explained including risks of esophageal damage, perforation (1:10,000 risk), bleeding, pharyngeal hematoma as well as other potential complications associated with conscious sedation including aspiration, arrhythmia, respiratory failure and death. Alternatives to treatment were discussed, questions were answered. Patient is willing to proceed.   Murray Hodgkins, NP  12/15/2016 9:43 AM

## 2016-12-15 NOTE — Care Management Note (Signed)
Case Management Note  Patient Details  Name: LAZARO ISENHOWER MRN: [id number] Date of Birth: 10/11/1929  Subjective/Objective:                    Action/Plan: Pt discharging home with [facility] services. CM met with the patient and provided them a list of [facility] agencies. They selected [facility]. Mary with [facility] notified and accepted the referral.  Wife will provide transportation home.   Expected Discharge Date:                  Expected Discharge Plan:  [place]  In-House Referral:     Discharge planning Services  CM Consult  Post Acute Care Choice:  Home Health Choice offered to:  Patient, Spouse  DME Arranged:    DME Agency:     HH Arranged:  PT, OT HH Agency:  [facility] (now [facility])  Status of Service:  Completed, signed off  If discussed at [place] of Stay Meetings, dates discussed:    Additional Comments:  Pollie Friar, RN 12/15/2016, 2:20 PM

## 2016-12-15 NOTE — Progress Notes (Signed)
Occupational Therapy Treatment Patient Details Name: [name] MRN: [id number] DOB: 1930-03-25 Today's Date: 12/15/2016    History of present illness Pt is an 83 y/o M who presented with transient left tongue numbness and family reports of L facial droop. MRI shows several small foci of acute infarction in the R MCA distribution with no acute hemorrhage. PMHx includes CVA, COPD, HTN, CAD, DM   OT comments  Pt demonstrates good progress toward OT goals. He continues to demonstrate impulsivity with decreased safety awareness as well as decreased problem solving skills requiring questioning cues throughout ADL. He was able to complete toilet transfers with min assist and standing grooming tasks with supervision and verbal cues for sequencing. D/C plan remains appropriate. OT will continue to follow while admitted.    Follow Up Recommendations  Home health OT;Supervision/Assistance - 24 hour    Equipment Recommendations  None recommended by OT    Recommendations for Other Services      Precautions / Restrictions Precautions Precautions: Fall Restrictions Weight Bearing Restrictions: No       Mobility Bed Mobility               General bed mobility comments: OOB in chair on arrival.  Transfers Overall transfer level: Needs assistance Equipment used: Straight cane Transfers: Sit to/from Stand Sit to Stand: Supervision              Balance Overall balance assessment: Needs assistance Sitting-balance support: No upper extremity supported;Feet supported Sitting balance-Leahy Scale: Good     Standing balance support: Single extremity supported;No upper extremity supported;During functional activity Standing balance-Leahy Scale: Fair Standing balance comment: Able to stand at sink with supervision and no UE support during grooming tasks.                           ADL either performed or assessed with clinical judgement   ADL Overall ADL's : Needs  assistance/impaired     Grooming: Brushing hair;Standing;Supervision/safety Grooming Details (indicate cue type and reason): Pt with some difficulty coordinating movement with hairbrush vs comb.          Upper Body Dressing : Minimal assistance;Standing Upper Body Dressing Details (indicate cue type and reason): To don back gown in standing. Difficulty problem solving during task requiring verbal cues. Lower Body Dressing: Min guard;Sit to/from stand   Toilet Transfer: Min guard;BSC;Ambulation (with SPC)   Toileting- Water quality scientist and Hygiene: Min guard;Sit to/from stand       Functional mobility during ADLs: Min guard;Cane General ADL Comments: Pt impulsive during mobility. When respiratory therapist dropped inhaler, pt unsafely bending down to pick it up.      Vision   Additional Comments: Pt with some difficulty identifying items on L side but functional throughout and question if this was impacted by attention deficits.    Perception     Praxis      Cognition Arousal/Alertness: Awake/alert Behavior During Therapy: WFL for tasks assessed/performed Overall Cognitive Status: Impaired/Different from baseline Area of Impairment: Following commands;Safety/judgement;Problem solving;Attention;Memory;Awareness                   Current Attention Level: Selective Memory: Decreased short-term memory;Decreased recall of precautions Following Commands: Follows one step commands consistently;Follows multi-step commands inconsistently Safety/Judgement: Decreased awareness of safety;Decreased awareness of deficits Awareness: Emergent Problem Solving: Slow processing;Requires verbal cues;Requires tactile cues General Comments: Pt impulsive and when moving quickly demonstrates decreased safety awareness. Pt able to complete ambulating cognitive  task to find the number "1" on signs in hallway with moderate questioning cues to continue with task. Difficulty following  commands to complete reaching tasks during ADL at sink.        Exercises     Shoulder Instructions       General Comments      Pertinent Vitals/ Pain       Pain Assessment: No/denies pain  Home Living                                          Prior Functioning/Environment              Frequency  Min 2X/week        Progress Toward Goals  OT Goals(current goals can now be found in the care plan section)  Progress towards OT goals: Progressing toward goals  Acute Rehab OT Goals Patient Stated Goal: get something to eat OT Goal Formulation: With patient Time For Goal Achievement: 12/28/16 Potential to Achieve Goals: Good ADL Goals Pt Will Perform Grooming: with supervision;standing Pt Will Perform Lower Body Bathing: with supervision;sit to/from stand Pt Will Perform Lower Body Dressing: with supervision;sit to/from stand Pt Will Transfer to Toilet: with supervision;ambulating;regular height toilet Pt Will Perform Toileting - Clothing Manipulation and hygiene: with supervision;sit to/from stand Additional ADL Goal #1: Pt will complete ADL tasks with no more than 1 verbal cue for safe task completion.   Plan Discharge plan remains appropriate    Co-evaluation                 AM-PAC PT "6 Clicks" Daily Activity     Outcome Measure   Help from another person eating meals?: None Help from another person taking care of personal grooming?: A Little Help from another person toileting, which includes using toliet, bedpan, or urinal?: A Little Help from another person bathing (including washing, rinsing, drying)?: A Little Help from another person to put on and taking off regular upper body clothing?: A Little Help from another person to put on and taking off regular lower body clothing?: A Little 6 Click Score: 19    End of Session Equipment Utilized During Treatment: Gait belt;Other (comment) (Single point cane)  OT Visit Diagnosis:  Other symptoms and signs involving cognitive function;Unsteadiness on feet (R26.81)   Activity Tolerance Patient tolerated treatment well   Patient Left in chair;with call bell/phone within reach;with family/visitor present   Nurse Communication Mobility status        Time: 4287-6811 OT Time Calculation (min): 15 min  Charges: OT General Charges $OT Visit: 1 Procedure OT Treatments $Self Care/Home Management : 8-22 mins  Norman Herrlich, MS OTR/L  Pager: [place] 12/15/2016, 10:17 AM

## 2016-12-15 NOTE — H&P (View-Only) (Signed)
PROGRESS NOTE    [name]  ZGY:[id number] DOB: [birth year] DOA: 12/13/2016 PCP: Timmothy Euler, MD   Outpatient Specialists:     Brief Narrative:  [name] is a 83 y.o. male with medical history significant for COPD, hypertension, coronary artery disease status post remote atherectomy, history of CVA, and type 2 diabetes mellitus who now presents to the emergency department for evaluation of transient, recurrent numbness involving the left side of the tongue with dysarthria. The patient reports that he been in his usual state until several days ago when he developed a nonspecific malaise and loss of appetite. He has been eating and drinking very little since that time. On 12/10/2016, patient developed sudden onset of numbness involving the left half of this tongue with dysarthria. This lasted 2-3 minutes and resolved. Symptoms recurred twice today, lasting 2-3 minutes each time, and again associated with dysarthria. Patient denies any recent fall or trauma, denies headache, change in vision or hearing, or focal numbness or weakness aside from the tongue. He denies any chest pain or palpitations. He continues to take Plavix and statin. He denies abdominal pain, nausea, vomiting, or diarrhea, but notes that he has just not "felt quite right," and has had very poor appetite. He denies any choking or cough or dyspnea.   Assessment & Plan:   Principal Problem:   TIA (transient ischemic attack) Active Problems:   Essential hypertension   CORONARY ATHEROSCLEROSIS NATIVE CORONARY ARTERY   CKD (chronic kidney disease) stage 3, GFR 30-59 ml/min   Dysarthria   DM type 2 (diabetes mellitus, type 2) (HCC)   History of stroke   GERD (gastroesophageal reflux disease)   Numbness of tongue   Hyponatremia   CVA with Transient tongue numbness and dysarthria  -MRI + -PT/OT- home health -neuro consulted  -LDL: 27 -echo/carotid pending -already on plavix/ASA TEE/loop recommended by  neuro  Hyponatremia   - Serum sodium is 127 on admission, previously normal  - improved with IVF   Hx of CVA - Continue Plavix and statin    CAD  - Status-post remote LAD atherectomy after MI at [facility]  - No anginal complaint, EKG without acute ischemic features, troponin <0.03  - Continue Plavix and Lipitor; hold benazepril and Lopressor in acute phase of possible CVA pending MRI    Hypertension  - BP slightly elevated  - Managed at home with hydralazine, benazepril, HCTZ, and Lopressor  - Antihypertensives held initially while evaluating for acute CVA/TIA   Type II DM  - A1c was 6.1% in May 2017  - Managed with metformin only at home; held  - Check CBG with meals and qHS  - Start a low-intensity Novolog correctional   CKD stage III  - SCr is 1.46, slightly up from apparent baseline of ~1.2  - He is being hydrated with NS as above and ACEi is held  - Repeat chem panel pending    DVT prophylaxis:  SQ Heparin  Code Status: Full Code   Family Communication: wife  Disposition Plan:     Consultants:   neuro   Subjective: Wants to go home  Objective: Vitals:   12/14/16 0330 12/14/16 0530 12/14/16 0928 12/14/16 0947  BP: 126/61 111/67  (!) 99/53  Pulse: 62 65  72  Resp: 18 18    Temp: 98.1 F (36.7 C) 98.1 F (36.7 C)  98.7 F (37.1 C)  TempSrc: Oral Oral  Oral  SpO2: 98% 95% 92% 97%    Intake/Output Summary (  Last 24 hours) at 12/14/16 1405 Last data filed at 12/14/16 0500  Gross per 24 hour  Intake              660 ml  Output                0 ml  Net              660 ml   There were no vitals filed for this visit.  Examination:  General exam: Appears calm and comfortable  Respiratory system: Clear to auscultation. Respiratory effort normal. Cardiovascular system: S1 & S2 heard, RRR. No JVD, murmurs, rubs, gallops or clicks. No pedal edema. Gastrointestinal system: Abdomen is nondistended, soft and nontender. No organomegaly or masses felt.  Normal bowel sounds heard. Central nervous system: Alert and oriented. No focal neurological deficits.     Data Reviewed: I have personally reviewed following labs and imaging studies  CBC:  Recent Labs Lab 12/13/16 1732 12/13/16 1738  WBC 8.3  --   NEUTROABS 5.5  --   HGB 14.8 14.6  HCT 41.9 43.0  MCV 91.5  --   PLT 201  --    Basic Metabolic Panel:  Recent Labs Lab 12/13/16 1732 12/13/16 1738 12/14/16 0509 12/14/16 1130  NA 127* 130* 130*  --   K 3.5 3.5 3.1*  --   CL 90* 87* 94*  --   CO2 26  --  26  --   GLUCOSE 160* 152* 128*  --   BUN 12 14 11   --   CREATININE 1.46* 1.30* 1.09  --   CALCIUM 9.4  --  8.8*  --   MG  --   --   --  1.6*   GFR: CrCl cannot be calculated (Unknown ideal weight.). Liver Function Tests:  Recent Labs Lab 12/13/16 1732  AST 28  ALT 18  ALKPHOS 65  BILITOT 1.4*  PROT 6.9  ALBUMIN 4.2   No results for input(s): LIPASE, AMYLASE in the last 168 hours. No results for input(s): AMMONIA in the last 168 hours. Coagulation Profile:  Recent Labs Lab 12/13/16 1732  INR 1.03   Cardiac Enzymes: No results for input(s): CKTOTAL, CKMB, CKMBINDEX, TROPONINI in the last 168 hours. BNP (last 3 results) No results for input(s): PROBNP in the last 8760 hours. HbA1C: No results for input(s): HGBA1C in the last 72 hours. CBG: No results for input(s): GLUCAP in the last 168 hours. Lipid Profile:  Recent Labs  12/14/16 0509  CHOL 85  HDL 30*  LDLCALC 27  TRIG 139  CHOLHDL 2.8   Thyroid Function Tests: No results for input(s): TSH, T4TOTAL, FREET4, T3FREE, THYROIDAB in the last 72 hours. Anemia Panel: No results for input(s): VITAMINB12, FOLATE, FERRITIN, TIBC, IRON, RETICCTPCT in the last 72 hours. Urine analysis:    Component Value Date/Time   COLORURINE YELLOW 12/13/2016 2045   APPEARANCEUR CLEAR 12/13/2016 2045   LABSPEC 1.011 12/13/2016 2045   PHURINE 6.0 12/13/2016 2045   GLUCOSEU NEGATIVE 12/13/2016 2045   HGBUR  NEGATIVE 12/13/2016 2045   BILIRUBINUR NEGATIVE 12/13/2016 2045   BILIRUBINUR neg 05/02/2014 [place] 12/13/2016 2045   PROTEINUR NEGATIVE 12/13/2016 2045   UROBILINOGEN negative 05/02/2014 0956   NITRITE NEGATIVE 12/13/2016 2045   LEUKOCYTESUR NEGATIVE 12/13/2016 2045     )No results found for this or any previous visit (from the past 240 hour(s)).    Anti-infectives    None       Radiology  Studies: Ct Head Wo Contrast  Result Date: 12/13/2016 CLINICAL DATA:  Left tongue numbness. EXAM: CT HEAD WITHOUT CONTRAST TECHNIQUE: Contiguous axial images were obtained from the base of the skull through the vertex without intravenous contrast. COMPARISON:  Head CT and brain MRI 12/10/2015 FINDINGS: Brain: Stable degree of atrophy and chronic small vessel ischemia. No evidence of acute infarct or large vessel ischemia. No mass effect, midline shift, hydrocephalus or extra-axial fluid collection. Vascular: Atherosclerosis of skullbase vasculature without hyperdense vessel or abnormal calcification. Skull: Normal. Negative for fracture or focal lesion. Sinuses/Orbits: Paranasal sinuses and mastoid air cells are clear. The visualized orbits are unremarkable. Bilateral cataract resection. Other: None. IMPRESSION: Similar degree of atrophy and chronic small vessel ischemia. No evidence of acute intracranial abnormality. Electronically Signed   By: Jeb Levering M.D.   On: 12/13/2016 21:09   Mr Jodene Nam Neck W Wo Contrast  Result Date: 12/14/2016 CLINICAL DATA:  83 y/o M; transient left tongue numbness, left facial droop, and speech changes. EXAM: MR HEAD WITHOUT CONTRAST MRA HEAD WITHOUT CONTRAST MRA OF THE NECK WITHOUT AND WITH CONTRAST TECHNIQUE: Multiplanar, multiecho pulse sequences of the brain and surrounding structures were obtained without intravenous contrast. Angiographic images of the neck were obtained using MRA technique without and with intravenous contrast. Angiographic  images of the head were obtained using MRA technique without intravenous contrast. CONTRAST:  8 cc MultiHance. COMPARISON:  12/13/2016 CT head. FINDINGS: MR HEAD FINDINGS Brain: 5 foci of reduced diffusion measuring up to 6 mm are present in right anterior caudate body, right posterior frontal cortex, right frontal operculum, right posterolateral temporal cortex, and right medial temporal cortex. Small chronic lacunar infarcts are present in bilateral cerebellar hemispheres, right pons, and bilateral lentiform nuclei. Foci of T2 FLAIR hyperintense signal abnormality in subcortical and periventricular white matter are compatible with moderate chronic microvascular ischemic changes and there is moderate brain parenchymal volume loss. No abnormal susceptibility hypointensity to indicate intracranial hemorrhage. No hydrocephalus, extra-axial collection, or focal mass effect. Vascular: As below. Skull and upper cervical spine: Normal marrow signal. Sinuses/Orbits: Negative. Other: Bilateral intra-ocular lens replacement. MRA HEAD FINDINGS Internal carotid arteries:  Patent. Anterior cerebral arteries:  Patent. Middle cerebral arteries: Patent. Anterior communicating artery: Possible diminutive anterior communicating artery. Posterior communicating arteries:  Patent. Posterior cerebral arteries: Patent. Large right posterior communicating artery and small right P1 segment compatible persistent fetal circulation. Moderate stenosis of proximal left P1 segment. Basilar artery:  Patent. Vertebral arteries:  Patent. No evidence of high-grade stenosis, large vessel occlusion, or aneurysm identified. MRA NECK FINDINGS Motion degraded study, suboptimal evaluation for subtle stenosis or aneurysm. Aortic arch: Patent. Right common carotid artery: Patent. Right internal carotid artery: Patent. Mild irregularity of the proximal ICA and carotid bifurcation with minimal stenosis is likely due to atherosclerosis. Possible plaque  ulceration of right proximal ICA (series 11602, image 63). Right vertebral artery: Patent. Left common carotid artery: Patent. Left Internal carotid artery: Patent. Mild irregularity of proximal ICA carotid bifurcation without significant stenosis. Left Vertebral artery: Patent. There is no evidence of high-grade stenosis, occlusion, or aneurysm identified. IMPRESSION: 1. Several small (less than 6 mm) foci of acute infarction are present in the right MCA distribution. No acute hemorrhage. 2. Moderate chronic microvascular ischemic changes and parenchymal volume loss of the brain. 3. Patent circle of Willis. No large vessel occlusion, aneurysm, or significant stenosis is identified. 4. Motion degraded MRA of the neck, suboptimal evaluation for subtle stenosis or aneurysm. 5. Patent carotid and vertebral arteries. No  large vessel occlusion or high-grade stenosis is identified. 6. Stable probable plaque ulceration of the right carotid bulb. These results will be called to the ordering clinician or representative by the Radiologist Assistant, and communication documented in the PACS or zVision Dashboard. Electronically Signed   By: Kristine Garbe M.D.   On: 12/14/2016 05:34   Mr Brain Wo Contrast  Result Date: 12/14/2016 CLINICAL DATA:  83 y/o M; transient left tongue numbness, left facial droop, and speech changes. EXAM: MR HEAD WITHOUT CONTRAST MRA HEAD WITHOUT CONTRAST MRA OF THE NECK WITHOUT AND WITH CONTRAST TECHNIQUE: Multiplanar, multiecho pulse sequences of the brain and surrounding structures were obtained without intravenous contrast. Angiographic images of the neck were obtained using MRA technique without and with intravenous contrast. Angiographic images of the head were obtained using MRA technique without intravenous contrast. CONTRAST:  8 cc MultiHance. COMPARISON:  12/13/2016 CT head. FINDINGS: MR HEAD FINDINGS Brain: 5 foci of reduced diffusion measuring up to 6 mm are present in right  anterior caudate body, right posterior frontal cortex, right frontal operculum, right posterolateral temporal cortex, and right medial temporal cortex. Small chronic lacunar infarcts are present in bilateral cerebellar hemispheres, right pons, and bilateral lentiform nuclei. Foci of T2 FLAIR hyperintense signal abnormality in subcortical and periventricular white matter are compatible with moderate chronic microvascular ischemic changes and there is moderate brain parenchymal volume loss. No abnormal susceptibility hypointensity to indicate intracranial hemorrhage. No hydrocephalus, extra-axial collection, or focal mass effect. Vascular: As below. Skull and upper cervical spine: Normal marrow signal. Sinuses/Orbits: Negative. Other: Bilateral intra-ocular lens replacement. MRA HEAD FINDINGS Internal carotid arteries:  Patent. Anterior cerebral arteries:  Patent. Middle cerebral arteries: Patent. Anterior communicating artery: Possible diminutive anterior communicating artery. Posterior communicating arteries:  Patent. Posterior cerebral arteries: Patent. Large right posterior communicating artery and small right P1 segment compatible persistent fetal circulation. Moderate stenosis of proximal left P1 segment. Basilar artery:  Patent. Vertebral arteries:  Patent. No evidence of high-grade stenosis, large vessel occlusion, or aneurysm identified. MRA NECK FINDINGS Motion degraded study, suboptimal evaluation for subtle stenosis or aneurysm. Aortic arch: Patent. Right common carotid artery: Patent. Right internal carotid artery: Patent. Mild irregularity of the proximal ICA and carotid bifurcation with minimal stenosis is likely due to atherosclerosis. Possible plaque ulceration of right proximal ICA (series 11602, image 63). Right vertebral artery: Patent. Left common carotid artery: Patent. Left Internal carotid artery: Patent. Mild irregularity of proximal ICA carotid bifurcation without significant stenosis. Left  Vertebral artery: Patent. There is no evidence of high-grade stenosis, occlusion, or aneurysm identified. IMPRESSION: 1. Several small (less than 6 mm) foci of acute infarction are present in the right MCA distribution. No acute hemorrhage. 2. Moderate chronic microvascular ischemic changes and parenchymal volume loss of the brain. 3. Patent circle of Willis. No large vessel occlusion, aneurysm, or significant stenosis is identified. 4. Motion degraded MRA of the neck, suboptimal evaluation for subtle stenosis or aneurysm. 5. Patent carotid and vertebral arteries. No large vessel occlusion or high-grade stenosis is identified. 6. Stable probable plaque ulceration of the right carotid bulb. These results will be called to the ordering clinician or representative by the Radiologist Assistant, and communication documented in the PACS or zVision Dashboard. Electronically Signed   By: Kristine Garbe M.D.   On: 12/14/2016 05:34   Mr Jodene Nam Head/brain QV Cm  Result Date: 12/14/2016 CLINICAL DATA:  83 y/o M; transient left tongue numbness, left facial droop, and speech changes. EXAM: MR HEAD WITHOUT CONTRAST MRA HEAD  WITHOUT CONTRAST MRA OF THE NECK WITHOUT AND WITH CONTRAST TECHNIQUE: Multiplanar, multiecho pulse sequences of the brain and surrounding structures were obtained without intravenous contrast. Angiographic images of the neck were obtained using MRA technique without and with intravenous contrast. Angiographic images of the head were obtained using MRA technique without intravenous contrast. CONTRAST:  8 cc MultiHance. COMPARISON:  12/13/2016 CT head. FINDINGS: MR HEAD FINDINGS Brain: 5 foci of reduced diffusion measuring up to 6 mm are present in right anterior caudate body, right posterior frontal cortex, right frontal operculum, right posterolateral temporal cortex, and right medial temporal cortex. Small chronic lacunar infarcts are present in bilateral cerebellar hemispheres, right pons, and  bilateral lentiform nuclei. Foci of T2 FLAIR hyperintense signal abnormality in subcortical and periventricular white matter are compatible with moderate chronic microvascular ischemic changes and there is moderate brain parenchymal volume loss. No abnormal susceptibility hypointensity to indicate intracranial hemorrhage. No hydrocephalus, extra-axial collection, or focal mass effect. Vascular: As below. Skull and upper cervical spine: Normal marrow signal. Sinuses/Orbits: Negative. Other: Bilateral intra-ocular lens replacement. MRA HEAD FINDINGS Internal carotid arteries:  Patent. Anterior cerebral arteries:  Patent. Middle cerebral arteries: Patent. Anterior communicating artery: Possible diminutive anterior communicating artery. Posterior communicating arteries:  Patent. Posterior cerebral arteries: Patent. Large right posterior communicating artery and small right P1 segment compatible persistent fetal circulation. Moderate stenosis of proximal left P1 segment. Basilar artery:  Patent. Vertebral arteries:  Patent. No evidence of high-grade stenosis, large vessel occlusion, or aneurysm identified. MRA NECK FINDINGS Motion degraded study, suboptimal evaluation for subtle stenosis or aneurysm. Aortic arch: Patent. Right common carotid artery: Patent. Right internal carotid artery: Patent. Mild irregularity of the proximal ICA and carotid bifurcation with minimal stenosis is likely due to atherosclerosis. Possible plaque ulceration of right proximal ICA (series 11602, image 63). Right vertebral artery: Patent. Left common carotid artery: Patent. Left Internal carotid artery: Patent. Mild irregularity of proximal ICA carotid bifurcation without significant stenosis. Left Vertebral artery: Patent. There is no evidence of high-grade stenosis, occlusion, or aneurysm identified. IMPRESSION: 1. Several small (less than 6 mm) foci of acute infarction are present in the right MCA distribution. No acute hemorrhage. 2.  Moderate chronic microvascular ischemic changes and parenchymal volume loss of the brain. 3. Patent circle of Willis. No large vessel occlusion, aneurysm, or significant stenosis is identified. 4. Motion degraded MRA of the neck, suboptimal evaluation for subtle stenosis or aneurysm. 5. Patent carotid and vertebral arteries. No large vessel occlusion or high-grade stenosis is identified. 6. Stable probable plaque ulceration of the right carotid bulb. These results will be called to the ordering clinician or representative by the Radiologist Assistant, and communication documented in the PACS or zVision Dashboard. Electronically Signed   By: Kristine Garbe M.D.   On: 12/14/2016 05:34        Scheduled Meds: . aspirin  325 mg Oral Daily  . atorvastatin  40 mg Oral Daily  . clopidogrel  75 mg Oral Daily  . feeding supplement (GLUCERNA SHAKE)  237 mL Oral TID BM  . heparin  5,000 Units Subcutaneous Q8H  . mometasone-formoterol  2 puff Inhalation BID  . omega-3 acid ethyl esters  2 g Oral Daily  . pantoprazole  40 mg Oral Daily  . potassium chloride  40 mEq Oral Once   Continuous Infusions:   LOS: 1 day    Time spent: 25 min    [place], DO [facility] Pager [phone]  If 7PM-7AM, please contact night-coverage [email or web address] Password  [email or web address] 12/14/2016, 2:05 PM

## 2016-12-15 NOTE — Discharge Instructions (Signed)
Wound care, monitor implant site Keep incision clean and dry for 3 days. You can remove outer dressing tomorrow. Leave steri-strips (little pieces of tape) on until seen in the office for wound check appointment. Call the office [phone]) for redness, drainage, swelling, or fever.

## 2016-12-15 NOTE — Discharge Summary (Signed)
Physician Discharge Summary  [name] TDV:[id number] DOB: 10/22/1929 DOA: 12/13/2016  PCP: Timmothy Euler, MD  Admit date: 12/13/2016 Discharge date: 12/15/2016   Recommendations for Outpatient Follow-Up:   1. Loop recorder placed 2. Outpatient home health   Discharge Diagnosis:   Active Problems:   Essential hypertension   CORONARY ATHEROSCLEROSIS NATIVE CORONARY ARTERY   CKD (chronic kidney disease) stage 3, GFR 30-59 ml/min   Dysarthria   DM type 2 (diabetes mellitus, type 2) (HCC)   History of stroke   GERD (gastroesophageal reflux disease)   Numbness of tongue   Hyponatremia   Cryptogenic stroke [facility])   Discharge disposition:  Home.  Discharge Condition: Improved.  Diet recommendation: Low sodium, heart healthy.  Carbohydrate-modified  Wound care: None.   History of Present Illness:   [name] is a 83 y.o. male with medical history significant for COPD, hypertension, coronary artery disease status post remote atherectomy, history of CVA, and type 2 diabetes mellitus who now presents to the emergency department for evaluation of transient, recurrent numbness involving the left side of the tongue with dysarthria. The patient reports that he been in his usual state until several days ago when he developed a nonspecific malaise and loss of appetite. He has been eating and drinking very little since that time. On 12/10/2016, patient developed sudden onset of numbness involving the left half of this tongue with dysarthria. This lasted 2-3 minutes and resolved. Symptoms recurred twice today, lasting 2-3 minutes each time, and again associated with dysarthria. Patient denies any recent fall or trauma, denies headache, change in vision or hearing, or focal numbness or weakness aside from the tongue. He denies any chest pain or palpitations. He continues to take Plavix and statin. He denies abdominal pain, nausea, vomiting, or diarrhea, but notes that he has just  not "felt quite right," and has had very poor appetite. He denies any choking or cough or dyspnea.   Hospital Course by Problem:  CVA with Transient tongue numbness and dysarthria  -MRI + -PT/OT- home health -neuro consulted  -LDL: 27 -echo/carotid done -already on plavix/ASA TEE showed PFO- duplex LE negative -loop recorder placed  Hyponatremia  - Serum sodium is 127 on admission, previously normal  - improved with IVF   Hx of CVA - Continue Plavix and statin   CAD  - Status-post remote LAD atherectomy after MI at [facility]  - No anginal complaint, EKG without acute ischemic features, troponin <0.03  - Continue Plavix and Lipitor; hold benazepril and Lopressor in acute phase of possible CVA pending MRI   Hypertension  - BP slightly elevated  - Managed at home with hydralazine, benazepril, HCTZ, and Lopressor  - Antihypertensives held initially while evaluating for acute CVA/TIA   Type II DM  - A1c was 6.1% in May 2017  - Managed with metformin only at home  CKD stage III  -follow up outpatient  Hypokalemia/hypomagnesium- repleted     Medical Consultants:    None.   Discharge Exam:   Vitals:   12/15/16 1125 12/15/16 1135  BP: (!) 152/73 134/66  Pulse: 74 77  Resp: 17 17  Temp: 98.4 F (36.9 C)    Vitals:   12/15/16 1110 12/15/16 1115 12/15/16 1125 12/15/16 1135  BP: (!) 190/92 (!) 180/90 (!) 152/73 134/66  Pulse: 69 70 74 77  Resp: 19 (!) 25 17 17   Temp:   98.4 F (36.9 C)   TempSrc:   Oral   SpO2: 98%  99% 93% 96%  Weight:      Height:        Gen:  NAD-- anxious to go home   The results of significant diagnostics from this hospitalization (including imaging, microbiology, ancillary and laboratory) are listed below for reference.     Procedures and Diagnostic Studies:   Ct Head Wo Contrast  Result Date: 12/13/2016 CLINICAL DATA:  Left tongue numbness. EXAM: CT HEAD WITHOUT CONTRAST TECHNIQUE: Contiguous axial images were obtained  from the base of the skull through the vertex without intravenous contrast. COMPARISON:  Head CT and brain MRI 12/10/2015 FINDINGS: Brain: Stable degree of atrophy and chronic small vessel ischemia. No evidence of acute infarct or large vessel ischemia. No mass effect, midline shift, hydrocephalus or extra-axial fluid collection. Vascular: Atherosclerosis of skullbase vasculature without hyperdense vessel or abnormal calcification. Skull: Normal. Negative for fracture or focal lesion. Sinuses/Orbits: Paranasal sinuses and mastoid air cells are clear. The visualized orbits are unremarkable. Bilateral cataract resection. Other: None. IMPRESSION: Similar degree of atrophy and chronic small vessel ischemia. No evidence of acute intracranial abnormality. Electronically Signed   By: Jeb Levering M.D.   On: 12/13/2016 21:09   Mr Jodene Nam Neck W Wo Contrast  Result Date: 12/14/2016 CLINICAL DATA:  83 y/o M; transient left tongue numbness, left facial droop, and speech changes. EXAM: MR HEAD WITHOUT CONTRAST MRA HEAD WITHOUT CONTRAST MRA OF THE NECK WITHOUT AND WITH CONTRAST TECHNIQUE: Multiplanar, multiecho pulse sequences of the brain and surrounding structures were obtained without intravenous contrast. Angiographic images of the neck were obtained using MRA technique without and with intravenous contrast. Angiographic images of the head were obtained using MRA technique without intravenous contrast. CONTRAST:  8 cc MultiHance. COMPARISON:  12/13/2016 CT head. FINDINGS: MR HEAD FINDINGS Brain: 5 foci of reduced diffusion measuring up to 6 mm are present in right anterior caudate body, right posterior frontal cortex, right frontal operculum, right posterolateral temporal cortex, and right medial temporal cortex. Small chronic lacunar infarcts are present in bilateral cerebellar hemispheres, right pons, and bilateral lentiform nuclei. Foci of T2 FLAIR hyperintense signal abnormality in subcortical and periventricular  white matter are compatible with moderate chronic microvascular ischemic changes and there is moderate brain parenchymal volume loss. No abnormal susceptibility hypointensity to indicate intracranial hemorrhage. No hydrocephalus, extra-axial collection, or focal mass effect. Vascular: As below. Skull and upper cervical spine: Normal marrow signal. Sinuses/Orbits: Negative. Other: Bilateral intra-ocular lens replacement. MRA HEAD FINDINGS Internal carotid arteries:  Patent. Anterior cerebral arteries:  Patent. Middle cerebral arteries: Patent. Anterior communicating artery: Possible diminutive anterior communicating artery. Posterior communicating arteries:  Patent. Posterior cerebral arteries: Patent. Large right posterior communicating artery and small right P1 segment compatible persistent fetal circulation. Moderate stenosis of proximal left P1 segment. Basilar artery:  Patent. Vertebral arteries:  Patent. No evidence of high-grade stenosis, large vessel occlusion, or aneurysm identified. MRA NECK FINDINGS Motion degraded study, suboptimal evaluation for subtle stenosis or aneurysm. Aortic arch: Patent. Right common carotid artery: Patent. Right internal carotid artery: Patent. Mild irregularity of the proximal ICA and carotid bifurcation with minimal stenosis is likely due to atherosclerosis. Possible plaque ulceration of right proximal ICA (series 11602, image 63). Right vertebral artery: Patent. Left common carotid artery: Patent. Left Internal carotid artery: Patent. Mild irregularity of proximal ICA carotid bifurcation without significant stenosis. Left Vertebral artery: Patent. There is no evidence of high-grade stenosis, occlusion, or aneurysm identified. IMPRESSION: 1. Several small (less than 6 mm) foci of acute infarction are present in the  right MCA distribution. No acute hemorrhage. 2. Moderate chronic microvascular ischemic changes and parenchymal volume loss of the brain. 3. Patent circle of Willis.  No large vessel occlusion, aneurysm, or significant stenosis is identified. 4. Motion degraded MRA of the neck, suboptimal evaluation for subtle stenosis or aneurysm. 5. Patent carotid and vertebral arteries. No large vessel occlusion or high-grade stenosis is identified. 6. Stable probable plaque ulceration of the right carotid bulb. These results will be called to the ordering clinician or representative by the Radiologist Assistant, and communication documented in the PACS or zVision Dashboard. Electronically Signed   By: Kristine Garbe M.D.   On: 12/14/2016 05:34   Mr Brain Wo Contrast  Result Date: 12/14/2016 CLINICAL DATA:  83 y/o M; transient left tongue numbness, left facial droop, and speech changes. EXAM: MR HEAD WITHOUT CONTRAST MRA HEAD WITHOUT CONTRAST MRA OF THE NECK WITHOUT AND WITH CONTRAST TECHNIQUE: Multiplanar, multiecho pulse sequences of the brain and surrounding structures were obtained without intravenous contrast. Angiographic images of the neck were obtained using MRA technique without and with intravenous contrast. Angiographic images of the head were obtained using MRA technique without intravenous contrast. CONTRAST:  8 cc MultiHance. COMPARISON:  12/13/2016 CT head. FINDINGS: MR HEAD FINDINGS Brain: 5 foci of reduced diffusion measuring up to 6 mm are present in right anterior caudate body, right posterior frontal cortex, right frontal operculum, right posterolateral temporal cortex, and right medial temporal cortex. Small chronic lacunar infarcts are present in bilateral cerebellar hemispheres, right pons, and bilateral lentiform nuclei. Foci of T2 FLAIR hyperintense signal abnormality in subcortical and periventricular white matter are compatible with moderate chronic microvascular ischemic changes and there is moderate brain parenchymal volume loss. No abnormal susceptibility hypointensity to indicate intracranial hemorrhage. No hydrocephalus, extra-axial collection, or  focal mass effect. Vascular: As below. Skull and upper cervical spine: Normal marrow signal. Sinuses/Orbits: Negative. Other: Bilateral intra-ocular lens replacement. MRA HEAD FINDINGS Internal carotid arteries:  Patent. Anterior cerebral arteries:  Patent. Middle cerebral arteries: Patent. Anterior communicating artery: Possible diminutive anterior communicating artery. Posterior communicating arteries:  Patent. Posterior cerebral arteries: Patent. Large right posterior communicating artery and small right P1 segment compatible persistent fetal circulation. Moderate stenosis of proximal left P1 segment. Basilar artery:  Patent. Vertebral arteries:  Patent. No evidence of high-grade stenosis, large vessel occlusion, or aneurysm identified. MRA NECK FINDINGS Motion degraded study, suboptimal evaluation for subtle stenosis or aneurysm. Aortic arch: Patent. Right common carotid artery: Patent. Right internal carotid artery: Patent. Mild irregularity of the proximal ICA and carotid bifurcation with minimal stenosis is likely due to atherosclerosis. Possible plaque ulceration of right proximal ICA (series 11602, image 63). Right vertebral artery: Patent. Left common carotid artery: Patent. Left Internal carotid artery: Patent. Mild irregularity of proximal ICA carotid bifurcation without significant stenosis. Left Vertebral artery: Patent. There is no evidence of high-grade stenosis, occlusion, or aneurysm identified. IMPRESSION: 1. Several small (less than 6 mm) foci of acute infarction are present in the right MCA distribution. No acute hemorrhage. 2. Moderate chronic microvascular ischemic changes and parenchymal volume loss of the brain. 3. Patent circle of Willis. No large vessel occlusion, aneurysm, or significant stenosis is identified. 4. Motion degraded MRA of the neck, suboptimal evaluation for subtle stenosis or aneurysm. 5. Patent carotid and vertebral arteries. No large vessel occlusion or high-grade  stenosis is identified. 6. Stable probable plaque ulceration of the right carotid bulb. These results will be called to the ordering clinician or representative by the Radiologist Assistant, and  communication documented in the PACS or zVision Dashboard. Electronically Signed   By: Kristine Garbe M.D.   On: 12/14/2016 05:34   Mr Jodene Nam Head/brain NL Cm  Result Date: 12/14/2016 CLINICAL DATA:  83 y/o M; transient left tongue numbness, left facial droop, and speech changes. EXAM: MR HEAD WITHOUT CONTRAST MRA HEAD WITHOUT CONTRAST MRA OF THE NECK WITHOUT AND WITH CONTRAST TECHNIQUE: Multiplanar, multiecho pulse sequences of the brain and surrounding structures were obtained without intravenous contrast. Angiographic images of the neck were obtained using MRA technique without and with intravenous contrast. Angiographic images of the head were obtained using MRA technique without intravenous contrast. CONTRAST:  8 cc MultiHance. COMPARISON:  12/13/2016 CT head. FINDINGS: MR HEAD FINDINGS Brain: 5 foci of reduced diffusion measuring up to 6 mm are present in right anterior caudate body, right posterior frontal cortex, right frontal operculum, right posterolateral temporal cortex, and right medial temporal cortex. Small chronic lacunar infarcts are present in bilateral cerebellar hemispheres, right pons, and bilateral lentiform nuclei. Foci of T2 FLAIR hyperintense signal abnormality in subcortical and periventricular white matter are compatible with moderate chronic microvascular ischemic changes and there is moderate brain parenchymal volume loss. No abnormal susceptibility hypointensity to indicate intracranial hemorrhage. No hydrocephalus, extra-axial collection, or focal mass effect. Vascular: As below. Skull and upper cervical spine: Normal marrow signal. Sinuses/Orbits: Negative. Other: Bilateral intra-ocular lens replacement. MRA HEAD FINDINGS Internal carotid arteries:  Patent. Anterior cerebral  arteries:  Patent. Middle cerebral arteries: Patent. Anterior communicating artery: Possible diminutive anterior communicating artery. Posterior communicating arteries:  Patent. Posterior cerebral arteries: Patent. Large right posterior communicating artery and small right P1 segment compatible persistent fetal circulation. Moderate stenosis of proximal left P1 segment. Basilar artery:  Patent. Vertebral arteries:  Patent. No evidence of high-grade stenosis, large vessel occlusion, or aneurysm identified. MRA NECK FINDINGS Motion degraded study, suboptimal evaluation for subtle stenosis or aneurysm. Aortic arch: Patent. Right common carotid artery: Patent. Right internal carotid artery: Patent. Mild irregularity of the proximal ICA and carotid bifurcation with minimal stenosis is likely due to atherosclerosis. Possible plaque ulceration of right proximal ICA (series 11602, image 63). Right vertebral artery: Patent. Left common carotid artery: Patent. Left Internal carotid artery: Patent. Mild irregularity of proximal ICA carotid bifurcation without significant stenosis. Left Vertebral artery: Patent. There is no evidence of high-grade stenosis, occlusion, or aneurysm identified. IMPRESSION: 1. Several small (less than 6 mm) foci of acute infarction are present in the right MCA distribution. No acute hemorrhage. 2. Moderate chronic microvascular ischemic changes and parenchymal volume loss of the brain. 3. Patent circle of Willis. No large vessel occlusion, aneurysm, or significant stenosis is identified. 4. Motion degraded MRA of the neck, suboptimal evaluation for subtle stenosis or aneurysm. 5. Patent carotid and vertebral arteries. No large vessel occlusion or high-grade stenosis is identified. 6. Stable probable plaque ulceration of the right carotid bulb. These results will be called to the ordering clinician or representative by the Radiologist Assistant, and communication documented in the PACS or zVision  Dashboard. Electronically Signed   By: Kristine Garbe M.D.   On: 12/14/2016 05:34     Labs:   Basic Metabolic Panel:  Recent Labs Lab 12/13/16 1732 12/13/16 1738 12/14/16 0509 12/14/16 1130 12/15/16 0806  NA 127* 130* 130*  --  134*  K 3.5 3.5 3.1*  --  3.8  CL 90* 87* 94*  --  100*  CO2 26  --  26  --  24  GLUCOSE 160* 152* 128*  --  152*  BUN 12 14 11   --  9  CREATININE 1.46* 1.30* 1.09  --  1.08  CALCIUM 9.4  --  8.8*  --  9.0  MG  --   --   --  1.6*  --    GFR Estimated Creatinine Clearance: 54.2 mL/min (by C-G formula based on SCr of 1.08 mg/dL). Liver Function Tests:  Recent Labs Lab 12/13/16 1732  AST 28  ALT 18  ALKPHOS 65  BILITOT 1.4*  PROT 6.9  ALBUMIN 4.2   No results for input(s): LIPASE, AMYLASE in the last 168 hours. No results for input(s): AMMONIA in the last 168 hours. Coagulation profile  Recent Labs Lab 12/13/16 1732  INR 1.03    CBC:  Recent Labs Lab 12/13/16 1732 12/13/16 1738 12/15/16 0806  WBC 8.3  --  5.9  NEUTROABS 5.5  --   --   HGB 14.8 14.6 13.7  HCT 41.9 43.0 38.9*  MCV 91.5  --  92.6  PLT 201  --  163   Cardiac Enzymes: No results for input(s): CKTOTAL, CKMB, CKMBINDEX, TROPONINI in the last 168 hours. BNP: Invalid input(s): POCBNP CBG: No results for input(s): GLUCAP in the last 168 hours. D-Dimer No results for input(s): DDIMER in the last 72 hours. Hgb A1c  Recent Labs  12/14/16 0509  HGBA1C 6.3*   Lipid Profile  Recent Labs  12/14/16 0509  CHOL 85  HDL 30*  LDLCALC 27  TRIG 139  CHOLHDL 2.8   Thyroid function studies No results for input(s): TSH, T4TOTAL, T3FREE, THYROIDAB in the last 72 hours.  Invalid input(s): FREET3 Anemia work up No results for input(s): VITAMINB12, FOLATE, FERRITIN, TIBC, IRON, RETICCTPCT in the last 72 hours. Microbiology No results found for this or any previous visit (from the past 240 hour(s)).   Discharge Instructions:   Discharge Instructions      Diet - low sodium heart healthy    Complete by:  As directed    Discharge instructions    Complete by:  As directed    ASA/plavix per neuro-- close follow up with Dr. Leonie Man   Increase activity slowly    Complete by:  As directed      Allergies as of 12/15/2016      Reactions   Codeine Other (See Comments)   Bad dreams   Lopid [gemfibrozil] Nausea Only   Voltaren [diclofenac Sodium] Diarrhea      Medication List    STOP taking these medications   rosuvastatin 10 MG tablet Commonly known as:  CRESTOR     TAKE these medications   acetaminophen 325 MG tablet Commonly known as:  TYLENOL Take 650 mg by mouth every 6 (six) hours as needed for mild pain.   aspirin EC 81 MG tablet Take 1 tablet (81 mg total) by mouth daily.   atorvastatin 40 MG tablet Commonly known as:  LIPITOR Take 1 tablet (40 mg total) by mouth daily.   benazepril 20 MG tablet Commonly known as:  LOTENSIN TAKE ONE TABLET BY MOUTH ONCE DAILY   clopidogrel 75 MG tablet Commonly known as:  PLAVIX TAKE ONE TABLET BY MOUTH ONCE DAILY   DIALYVITE VITAMIN D3 MAX 46270 units Tabs Generic drug:  Cholecalciferol TAKE ONE TABLET BY MOUTH ONCE A WEEK   diclofenac sodium 1 % Gel Commonly known as:  VOLTAREN Apply 2 g topically 4 (four) times daily.   feeding supplement (GLUCERNA SHAKE) Liqd Take 237 mLs by mouth 2 (two) times daily between  meals.   fish oil-omega-3 fatty acids 1000 MG capsule Take 2 g by mouth daily.   Fluticasone-Salmeterol 100-50 MCG/DOSE Aepb Commonly known as:  ADVAIR DISKUS INHALE ONE DOSE BY MOUTH EVERY 12 HOURS What changed:  See the new instructions.   glucose blood test strip Check blood sugar twice daily and as needed. Dx Code   hydrALAZINE 25 MG tablet Commonly known as:  APRESOLINE Take 1 tablet (25 mg total) by mouth daily as needed.   hydrochlorothiazide 25 MG tablet Commonly known as:  HYDRODIURIL TAKE ONE-HALF TABLET BY MOUTH ONCE DAILY EXCEPT ON MONDAY,  WEDNESDAY AND FRIDAY TAKE A WHOLE TABLET   metFORMIN 500 MG 24 hr tablet Commonly known as:  GLUCOPHAGE-XR TAKE ONE TABLET BY MOUTH TWICE DAILY   metoprolol tartrate 100 MG tablet Commonly known as:  LOPRESSOR TAKE ONE TABLET BY MOUTH TWICE DAILY   nitroGLYCERIN 0.4 MG SL tablet Commonly known as:  NITROSTAT Place 0.4 mg under the tongue every 5 (five) minutes as needed for chest pain.   pantoprazole 40 MG tablet Commonly known as:  PROTONIX Take 1 tablet (40 mg total) by mouth daily.   PROAIR HFA 108 (90 Base) MCG/ACT inhaler Generic drug:  albuterol INHALE TWO PUFFS BY MOUTH 4 TIMES DAILY AS NEEDED      Follow-up Information    Timmothy Euler, MD Follow up in 1 week(s).   Specialty:  Family Medicine Contact information: [place] [address] [phone]        Garvin Fila, MD Follow up.   Specialties:  Neurology, Radiology Why:  keep scheduled appointment in July Contact information: [address] [place] [address] [phone]            Time coordinating discharge: 35 min  Signed:  [place]   [facility] 12/15/2016, 4:08 PM

## 2016-12-15 NOTE — Interval H&P Note (Signed)
History and Physical Interval Note:  12/15/2016 10:18 AM  [name]  has presented today for surgery, with the diagnosis of stroke  The various methods of treatment have been discussed with the patient and family. After consideration of risks, benefits and other options for treatment, the patient has consented to  Procedure(s): TRANSESOPHAGEAL ECHOCARDIOGRAM (TEE) (N/A) as a surgical intervention .  The patient's history has been reviewed, patient examined, no change in status, stable for surgery.  I have reviewed the patient's chart and labs.  Questions were answered to the patient's satisfaction.     Kirk Ruths

## 2016-12-15 NOTE — Progress Notes (Signed)
*  Preliminary Results* Bilateral lower extremity venous duplex completed. Bilateral lower extremities are negative for deep vein thrombosis. There is no evidence of Baker's cyst bilaterally.  12/15/2016 3:40 PM Maudry Mayhew, BS, RVT, RDCS, RDMS

## 2016-12-16 ENCOUNTER — Other Ambulatory Visit: Payer: Self-pay | Admitting: Family Medicine

## 2016-12-16 ENCOUNTER — Encounter (HOSPITAL_COMMUNITY): Payer: Self-pay | Admitting: Internal Medicine

## 2016-12-16 DIAGNOSIS — E1122 Type 2 diabetes mellitus with diabetic chronic kidney disease: Secondary | ICD-10-CM | POA: Diagnosis not present

## 2016-12-16 DIAGNOSIS — I129 Hypertensive chronic kidney disease with stage 1 through stage 4 chronic kidney disease, or unspecified chronic kidney disease: Secondary | ICD-10-CM | POA: Diagnosis not present

## 2016-12-16 DIAGNOSIS — M1991 Primary osteoarthritis, unspecified site: Secondary | ICD-10-CM | POA: Diagnosis not present

## 2016-12-16 DIAGNOSIS — J449 Chronic obstructive pulmonary disease, unspecified: Secondary | ICD-10-CM | POA: Diagnosis not present

## 2016-12-16 DIAGNOSIS — R2681 Unsteadiness on feet: Secondary | ICD-10-CM | POA: Diagnosis not present

## 2016-12-16 DIAGNOSIS — Z9181 History of falling: Secondary | ICD-10-CM | POA: Diagnosis not present

## 2016-12-16 DIAGNOSIS — N183 Chronic kidney disease, stage 3 (moderate): Secondary | ICD-10-CM | POA: Diagnosis not present

## 2016-12-16 DIAGNOSIS — Z9861 Coronary angioplasty status: Secondary | ICD-10-CM | POA: Diagnosis not present

## 2016-12-16 DIAGNOSIS — J4530 Mild persistent asthma, uncomplicated: Secondary | ICD-10-CM | POA: Diagnosis not present

## 2016-12-16 DIAGNOSIS — Z95818 Presence of other cardiac implants and grafts: Secondary | ICD-10-CM | POA: Diagnosis not present

## 2016-12-16 DIAGNOSIS — Z8673 Personal history of transient ischemic attack (TIA), and cerebral infarction without residual deficits: Secondary | ICD-10-CM | POA: Diagnosis not present

## 2016-12-16 DIAGNOSIS — I2510 Atherosclerotic heart disease of native coronary artery without angina pectoris: Secondary | ICD-10-CM | POA: Diagnosis not present

## 2016-12-16 DIAGNOSIS — M6281 Muscle weakness (generalized): Secondary | ICD-10-CM | POA: Diagnosis not present

## 2016-12-18 DIAGNOSIS — M6281 Muscle weakness (generalized): Secondary | ICD-10-CM | POA: Diagnosis not present

## 2016-12-18 DIAGNOSIS — I129 Hypertensive chronic kidney disease with stage 1 through stage 4 chronic kidney disease, or unspecified chronic kidney disease: Secondary | ICD-10-CM | POA: Diagnosis not present

## 2016-12-18 DIAGNOSIS — Z9181 History of falling: Secondary | ICD-10-CM | POA: Diagnosis not present

## 2016-12-18 DIAGNOSIS — Z8673 Personal history of transient ischemic attack (TIA), and cerebral infarction without residual deficits: Secondary | ICD-10-CM | POA: Diagnosis not present

## 2016-12-18 DIAGNOSIS — J449 Chronic obstructive pulmonary disease, unspecified: Secondary | ICD-10-CM | POA: Diagnosis not present

## 2016-12-18 DIAGNOSIS — Z95818 Presence of other cardiac implants and grafts: Secondary | ICD-10-CM | POA: Diagnosis not present

## 2016-12-18 DIAGNOSIS — M1991 Primary osteoarthritis, unspecified site: Secondary | ICD-10-CM | POA: Diagnosis not present

## 2016-12-18 DIAGNOSIS — J4530 Mild persistent asthma, uncomplicated: Secondary | ICD-10-CM | POA: Diagnosis not present

## 2016-12-18 DIAGNOSIS — N183 Chronic kidney disease, stage 3 (moderate): Secondary | ICD-10-CM | POA: Diagnosis not present

## 2016-12-18 DIAGNOSIS — E1122 Type 2 diabetes mellitus with diabetic chronic kidney disease: Secondary | ICD-10-CM | POA: Diagnosis not present

## 2016-12-18 DIAGNOSIS — Z9861 Coronary angioplasty status: Secondary | ICD-10-CM | POA: Diagnosis not present

## 2016-12-18 DIAGNOSIS — R2681 Unsteadiness on feet: Secondary | ICD-10-CM | POA: Diagnosis not present

## 2016-12-18 DIAGNOSIS — I2510 Atherosclerotic heart disease of native coronary artery without angina pectoris: Secondary | ICD-10-CM | POA: Diagnosis not present

## 2016-12-22 DIAGNOSIS — Z8673 Personal history of transient ischemic attack (TIA), and cerebral infarction without residual deficits: Secondary | ICD-10-CM | POA: Diagnosis not present

## 2016-12-22 DIAGNOSIS — J4530 Mild persistent asthma, uncomplicated: Secondary | ICD-10-CM | POA: Diagnosis not present

## 2016-12-22 DIAGNOSIS — N183 Chronic kidney disease, stage 3 (moderate): Secondary | ICD-10-CM | POA: Diagnosis not present

## 2016-12-22 DIAGNOSIS — Z9861 Coronary angioplasty status: Secondary | ICD-10-CM | POA: Diagnosis not present

## 2016-12-22 DIAGNOSIS — M1991 Primary osteoarthritis, unspecified site: Secondary | ICD-10-CM | POA: Diagnosis not present

## 2016-12-22 DIAGNOSIS — R2681 Unsteadiness on feet: Secondary | ICD-10-CM | POA: Diagnosis not present

## 2016-12-22 DIAGNOSIS — I2510 Atherosclerotic heart disease of native coronary artery without angina pectoris: Secondary | ICD-10-CM | POA: Diagnosis not present

## 2016-12-22 DIAGNOSIS — I129 Hypertensive chronic kidney disease with stage 1 through stage 4 chronic kidney disease, or unspecified chronic kidney disease: Secondary | ICD-10-CM | POA: Diagnosis not present

## 2016-12-22 DIAGNOSIS — M6281 Muscle weakness (generalized): Secondary | ICD-10-CM | POA: Diagnosis not present

## 2016-12-22 DIAGNOSIS — Z9181 History of falling: Secondary | ICD-10-CM | POA: Diagnosis not present

## 2016-12-22 DIAGNOSIS — Z95818 Presence of other cardiac implants and grafts: Secondary | ICD-10-CM | POA: Diagnosis not present

## 2016-12-22 DIAGNOSIS — E1122 Type 2 diabetes mellitus with diabetic chronic kidney disease: Secondary | ICD-10-CM | POA: Diagnosis not present

## 2016-12-22 DIAGNOSIS — J449 Chronic obstructive pulmonary disease, unspecified: Secondary | ICD-10-CM | POA: Diagnosis not present

## 2016-12-23 ENCOUNTER — Other Ambulatory Visit: Payer: Self-pay

## 2016-12-23 NOTE — Patient Outreach (Signed)
[facility]) Care Management  12/23/2016  BRAETON WOLGAMOTT [birth year] [id number]     EMMI-STROKE RED ON EMMI ALERT Day # 6 Date: 12/22/16 Red Alert Reason: "Feeling owrse overall? Yes" "New problems walking/talking/speaking/seeing? Yes"    Outreach attempt #  1 to patient. Spoke with patient. reviewed and addressed red alerts. Patient states he wasn't feeling too good on yesterday. He reports that his BP has been running high at times. He states BP was elevated this morning (190s) prior to taking morning meds. Since he has taken meds he rechecked BP and BP was down to 140s/70s. Patient states PCP is aware and he has f/u appt with him on tomorrow. He denies any issues with transportation and or meds. Lives with supportive spouse who is able to assist with care needs. He voices no further RN CM needs or concerns at this time. Advised patient that they would continue to get automated EMMI-Stroke post discharge calls to assess how they are doing following recent hospitalization and will receive a call from a nurse if any of their responses were abnormal. Patient voiced understanding and was appreciative of f/u call.     Plan: RN CM will notify [facility] administrative assistant of case status.   Enzo Montgomery, RN,BSN,CCM [place] [facility] Telephonic Care Management Coordinator Direct Phone: [phone] Toll Free: [phone] Fax: [phone]

## 2016-12-24 ENCOUNTER — Ambulatory Visit: Payer: Medicare Other | Admitting: Family Medicine

## 2016-12-24 ENCOUNTER — Encounter: Payer: Self-pay | Admitting: Family Medicine

## 2016-12-24 ENCOUNTER — Ambulatory Visit (INDEPENDENT_AMBULATORY_CARE_PROVIDER_SITE_OTHER): Payer: Medicare Other | Admitting: Family Medicine

## 2016-12-24 VITALS — BP 129/80 | HR 59 | Temp 98.0°F | Ht 71.0 in | Wt 186.5 lb

## 2016-12-24 DIAGNOSIS — G4700 Insomnia, unspecified: Secondary | ICD-10-CM

## 2016-12-24 NOTE — Progress Notes (Signed)
BP 129/80   Pulse (!) 59   Temp 98 F (36.7 C) (Oral)   Ht 5\' 11"  (1.803 m)   Wt 186 lb 8 oz (84.6 kg)   BMI 26.01 kg/m    Subjective:    Patient ID: [name], male    DOB: [birth year], 83 y.o.   MRN: [id number]  HPI: [name] is a 83 y.o. male presenting on 12/24/2016 for Hospital followup (wife reports he is having difficulty sleeping, wondering about something mild to help him)   HPI Hospital follow-up for CVA/difficulty sleeping Patient is coming in today for hospital follow-up for difficulty sleeping after leaving the hospital. He was in the hospital about 1 week ago for 2 days and was diagnosed with a TIA and all the symptoms resolved. Since leaving the hospital he has been more anxious. His wife and having difficulty sleeping at night because of his mind racing and worrying about things. He admits to this but denies any major symptoms during the day. He denies any shortness of breath or chest pain or numbness or weakness or headaches. He mainly has trouble with sleep onset, once he is asleep usually does pretty well.  Relevant past medical, surgical, family and social history reviewed and updated as indicated. Interim medical history since our last visit reviewed. Allergies and medications reviewed and updated.  Review of Systems  Constitutional: Negative for chills and fever.  Respiratory: Negative for shortness of breath and wheezing.   Cardiovascular: Negative for chest pain and leg swelling.  Musculoskeletal: Negative for back pain and gait problem.  Skin: Negative for rash.  Psychiatric/Behavioral: Positive for sleep disturbance. Negative for decreased concentration, dysphoric mood, self-injury and suicidal ideas. The patient is nervous/anxious.   All other systems reviewed and are negative.   Per HPI unless specifically indicated above        Objective:    BP 129/80   Pulse (!) 59   Temp 98 F (36.7 C) (Oral)   Ht 5\' 11"  (1.803 m)   Wt 186 lb 8  oz (84.6 kg)   BMI 26.01 kg/m   Wt Readings from Last 3 Encounters:  12/24/16 186 lb 8 oz (84.6 kg)  12/15/16 190 lb (86.2 kg)  11/05/16 190 lb (86.2 kg)    Physical Exam  Constitutional: He is oriented to person, place, and time. He appears well-developed and well-nourished. No distress.  Eyes: Conjunctivae are normal. No scleral icterus.  Neck: Neck supple. No thyromegaly present.  Cardiovascular: Normal rate, regular rhythm, normal heart sounds and intact distal pulses.   No murmur heard. Pulmonary/Chest: Effort normal and breath sounds normal. No respiratory distress. He has no wheezes.  Musculoskeletal: Normal range of motion. He exhibits no edema.  Lymphadenopathy:    He has no cervical adenopathy.  Neurological: He is alert and oriented to person, place, and time. No cranial nerve deficit. He exhibits normal muscle tone. Coordination normal.  Skin: Skin is warm and dry. No rash noted. He is not diaphoretic.  Psychiatric: He has a normal mood and affect. His behavior is normal. Thought content normal.  Nursing note and vitals reviewed.     Assessment & Plan:   Problem List Items Addressed This Visit    None    Visit Diagnoses    Insomnia, unspecified type    -  Primary   Patient has issues since leaving the hospital with his CVA. Will try over-the-counter melatonin first and let us know. Possibly from anxiety  Follow up plan: Return if symptoms worsen or fail to improve.  Counseling provided for all of the vaccine components No orders of the defined types were placed in this encounter.   Caryl Pina, MD [facility] 12/24/2016, 11:33 AM

## 2016-12-25 DIAGNOSIS — Z9861 Coronary angioplasty status: Secondary | ICD-10-CM | POA: Diagnosis not present

## 2016-12-25 DIAGNOSIS — N183 Chronic kidney disease, stage 3 (moderate): Secondary | ICD-10-CM | POA: Diagnosis not present

## 2016-12-25 DIAGNOSIS — Z8673 Personal history of transient ischemic attack (TIA), and cerebral infarction without residual deficits: Secondary | ICD-10-CM | POA: Diagnosis not present

## 2016-12-25 DIAGNOSIS — E1122 Type 2 diabetes mellitus with diabetic chronic kidney disease: Secondary | ICD-10-CM | POA: Diagnosis not present

## 2016-12-25 DIAGNOSIS — I129 Hypertensive chronic kidney disease with stage 1 through stage 4 chronic kidney disease, or unspecified chronic kidney disease: Secondary | ICD-10-CM | POA: Diagnosis not present

## 2016-12-25 DIAGNOSIS — M1991 Primary osteoarthritis, unspecified site: Secondary | ICD-10-CM | POA: Diagnosis not present

## 2016-12-25 DIAGNOSIS — I2510 Atherosclerotic heart disease of native coronary artery without angina pectoris: Secondary | ICD-10-CM | POA: Diagnosis not present

## 2016-12-25 DIAGNOSIS — R2681 Unsteadiness on feet: Secondary | ICD-10-CM | POA: Diagnosis not present

## 2016-12-25 DIAGNOSIS — M6281 Muscle weakness (generalized): Secondary | ICD-10-CM | POA: Diagnosis not present

## 2016-12-25 DIAGNOSIS — J449 Chronic obstructive pulmonary disease, unspecified: Secondary | ICD-10-CM | POA: Diagnosis not present

## 2016-12-25 DIAGNOSIS — Z9181 History of falling: Secondary | ICD-10-CM | POA: Diagnosis not present

## 2016-12-25 DIAGNOSIS — J4530 Mild persistent asthma, uncomplicated: Secondary | ICD-10-CM | POA: Diagnosis not present

## 2016-12-25 DIAGNOSIS — Z95818 Presence of other cardiac implants and grafts: Secondary | ICD-10-CM | POA: Diagnosis not present

## 2016-12-27 NOTE — Progress Notes (Signed)
HPI The patient presents for followup of his known coronary disease.  He was hospitalized earlier this month with a TIA.  I reviewed these records for this visit.    He had a loop recorder placed.  He did have a TEE demonstrating a small PFO. Doppler was negative for DVT.  MRI demonstrated small acute infarcts in the MCA distribution. He had an implanted loop monitor. He presents for follow-up and says that he's had some weakness but he didn't have any residual from this event. It was mostly some numbness in his tongue. He's not had any palpitations, presyncope or syncope. He's not having any chest pressure, neck or arm discomfort. Of note he was to have knee surgery in July but now this is put off at least for 3 months. He has an appointment in the device clinic today.  He does have follow-up in neurology.  The patient denies any new symptoms such as chest discomfort, neck or arm discomfort. There has been no new shortness of breath, PND or orthopnea.    Allergies  Allergen Reactions  . Codeine Other (See Comments)    Bad dreams  . Lopid [Gemfibrozil] Nausea Only  . Voltaren [Diclofenac Sodium] Diarrhea    Current Outpatient Prescriptions  Medication Sig Dispense Refill  . acetaminophen (TYLENOL) 325 MG tablet Take 650 mg by mouth every 6 (six) hours as needed for mild pain.    Marland Kitchen aspirin EC 81 MG tablet Take 1 tablet (81 mg total) by mouth daily. 150 tablet 2  . atorvastatin (LIPITOR) 40 MG tablet Take 1 tablet (40 mg total) by mouth daily. 90 tablet 0  . benazepril (LOTENSIN) 20 MG tablet TAKE ONE TABLET BY MOUTH ONCE DAILY 90 tablet 0  . Cholecalciferol (VITAMIN D) 2000 units CAPS Take 2,000 Units by mouth daily.    . clopidogrel (PLAVIX) 75 MG tablet TAKE ONE TABLET BY MOUTH ONCE DAILY 90 tablet 3  . DIALYVITE VITAMIN D3 MAX 56812 units TABS TAKE ONE TABLET BY MOUTH ONCE A WEEK 12 tablet 0  . diclofenac sodium (VOLTAREN) 1 % GEL Apply 2 g topically 4 (four) times daily. 100 g 1  .  feeding supplement, GLUCERNA SHAKE, (GLUCERNA SHAKE) LIQD Take 237 mLs by mouth 2 (two) times daily between meals. 60 Can 11  . fish oil-omega-3 fatty acids 1000 MG capsule Take 2 g by mouth daily.      . Fluticasone-Salmeterol (ADVAIR DISKUS) 100-50 MCG/DOSE AEPB INHALE ONE DOSE BY MOUTH EVERY 12 HOURS 180 each 2  . hydrALAZINE (APRESOLINE) 25 MG tablet Take 1 tablet (25 mg total) by mouth daily as needed. 90 tablet 3  . hydrochlorothiazide (HYDRODIURIL) 25 MG tablet TAKE ONE-HALF TABLET BY MOUTH ONCE DAILY EXCEPT ON MONDAY, WEDNESDAY AND FRIDAY TAKE A WHOLE TABLET 90 tablet 3  . metFORMIN (GLUCOPHAGE-XR) 500 MG 24 hr tablet TAKE ONE TABLET BY MOUTH TWICE DAILY 180 tablet 3  . metoprolol (LOPRESSOR) 100 MG tablet TAKE ONE TABLET BY MOUTH TWICE DAILY 180 tablet 3  . nitroGLYCERIN (NITROSTAT) 0.4 MG SL tablet Place 0.4 mg under the tongue every 5 (five) minutes as needed for chest pain.    . pantoprazole (PROTONIX) 40 MG tablet Take 1 tablet (40 mg total) by mouth daily. 30 tablet 11  . PROAIR HFA 108 (90 Base) MCG/ACT inhaler INHALE 2 PUFFS BY MOUTH 4 TIMES DAILY AS NEEDED 9 each 2  . glucose blood test strip Check blood sugar twice daily and as needed. Dx Code 100 each  12   No current facility-administered medications for this visit.     Past Medical History:  Diagnosis Date  . Arthritis    "wrists, knees, back" (12/10/2015)  . Asthma   . Cerebral atherosclerosis   . Chronic back pain    "lower primarily" (12/10/2015)  . CORONARY ATHEROSCLEROSIS NATIVE CORONARY ARTERY    Myocardial infarction at [facility] in 1996 with a directional atherectomy of the 90% LAD stenosis and medical management of 60-70% proximal circumflex and 50% RCA stenosis.  . DYSLIPIDEMIA   . GERD (gastroesophageal reflux disease)   . History of hiatal hernia   . HYPERTENSION   . ILIAC ARTERY ANEURYSM   . Myocardial infarction ([place]) 1996  . Stroke [facility]) 2003; 2013; 07/2015; 12/05/2015   denies residual on 12/10/2015  .  TIA (transient ischemic attack)   . Type II diabetes mellitus ([place])   . UPPER RESPIRATORY INFECTION     Past Surgical History:  Procedure Laterality Date  . ANAL FISSURE REPAIR  1990s  . BASAL CELL CARCINOMA EXCISION Right 12/2015   "temple"  . CATARACT EXTRACTION W/ INTRAOCULAR LENS  IMPLANT, BILATERAL Bilateral 2000s  . CORONARY ANGIOPLASTY  1996  . KNEE CARTILAGE SURGERY Right 1970s  . LOOP RECORDER INSERTION N/A 12/15/2016   Procedure: Loop Recorder Insertion;  Surgeon: Thompson Grayer, MD;  Location: [place] CV LAB;  Service: Cardiovascular;  Laterality: N/A;  . SHOULDER ARTHROSCOPY W/ ROTATOR CUFF REPAIR Left 2000s  . TEE WITHOUT CARDIOVERSION N/A 12/15/2016   Procedure: TRANSESOPHAGEAL ECHOCARDIOGRAM (TEE);  Surgeon: Lelon Perla, MD;  Location: [facility] ENDOSCOPY;  Service: Cardiovascular;  Laterality: N/A;    ROS:   As stated in the HPI and negative for all other systems.  PHYSICAL EXAM BP 110/70   Pulse 80   Ht 5\' 11"  (1.803 m)   Wt 186 lb (84.4 kg)   BMI 25.94 kg/m   GENERAL:  Well appearing NECK:  No jugular venous distention, waveform within normal limits, carotid upstroke brisk and symmetric, no bruits, no thyromegaly LYMPHATICS:  No cervical, inguinal adenopathy LUNGS:  Clear to auscultation bilaterally BACK:  No CVA tenderness CHEST:  Unremarkable HEART:  PMI not displaced or sustained,S1 and S2 within normal limits, no S3, no S4, no clicks, no rubs, no murmurs ABD:  Flat, positive bowel sounds normal in frequency in pitch, no bruits, no rebound, no guarding, no midline pulsatile mass, no hepatomegaly, no splenomegaly EXT:  2 plus pulses throughout, no edema, no cyanosis no clubbing, severe joint deformity of the right knee    Lab Results  Component Value Date   HGBA1C 6.3 (H) 12/14/2016   Lab Results  Component Value Date   CHOL 85 12/14/2016   TRIG 139 12/14/2016   HDL 30 (L) 12/14/2016   LDLCALC 27 12/14/2016     ASSESSMENT AND PLAN  TIA:  He  is on aspirin and Plavix. He has his loop monitor. We will follow this. He has follow-up with neurology. No change in therapy is indicated today. Hospital records reviewed.  PFO:  This is an incidental finding. No change in therapy is indicated.  CAD:   He's had no new symptoms since stress testing in 2012. Continue meds as listed.  HTN:   The blood pressure is at target. No change in medications is indicated. We will continue with therapeutic lifestyle changes (TLC).    HYPERLIPIDEMIA:  I note that he was taken off of Crestor in the hospital and switch to Lipitor. Labs as above.  Follow up lipids per. Timmothy Euler, MD  CAROTID STENOSIS:  This was mild in 2016.  No further imaging is indicated at this point.  He had MRI as above.   DM:  He will continue on the meds as listed. His A1c was 6.3.

## 2016-12-29 ENCOUNTER — Encounter: Payer: Self-pay | Admitting: Cardiology

## 2016-12-29 DIAGNOSIS — R2681 Unsteadiness on feet: Secondary | ICD-10-CM | POA: Diagnosis not present

## 2016-12-29 DIAGNOSIS — M6281 Muscle weakness (generalized): Secondary | ICD-10-CM | POA: Diagnosis not present

## 2016-12-29 DIAGNOSIS — M1991 Primary osteoarthritis, unspecified site: Secondary | ICD-10-CM | POA: Diagnosis not present

## 2016-12-29 DIAGNOSIS — N183 Chronic kidney disease, stage 3 (moderate): Secondary | ICD-10-CM | POA: Diagnosis not present

## 2016-12-29 DIAGNOSIS — I2510 Atherosclerotic heart disease of native coronary artery without angina pectoris: Secondary | ICD-10-CM | POA: Diagnosis not present

## 2016-12-29 DIAGNOSIS — Z95818 Presence of other cardiac implants and grafts: Secondary | ICD-10-CM | POA: Diagnosis not present

## 2016-12-29 DIAGNOSIS — J4530 Mild persistent asthma, uncomplicated: Secondary | ICD-10-CM | POA: Diagnosis not present

## 2016-12-29 DIAGNOSIS — E1122 Type 2 diabetes mellitus with diabetic chronic kidney disease: Secondary | ICD-10-CM | POA: Diagnosis not present

## 2016-12-29 DIAGNOSIS — Z8673 Personal history of transient ischemic attack (TIA), and cerebral infarction without residual deficits: Secondary | ICD-10-CM | POA: Diagnosis not present

## 2016-12-29 DIAGNOSIS — Z9861 Coronary angioplasty status: Secondary | ICD-10-CM | POA: Diagnosis not present

## 2016-12-29 DIAGNOSIS — Z9181 History of falling: Secondary | ICD-10-CM | POA: Diagnosis not present

## 2016-12-29 DIAGNOSIS — I129 Hypertensive chronic kidney disease with stage 1 through stage 4 chronic kidney disease, or unspecified chronic kidney disease: Secondary | ICD-10-CM | POA: Diagnosis not present

## 2016-12-29 DIAGNOSIS — J449 Chronic obstructive pulmonary disease, unspecified: Secondary | ICD-10-CM | POA: Diagnosis not present

## 2016-12-30 ENCOUNTER — Ambulatory Visit (INDEPENDENT_AMBULATORY_CARE_PROVIDER_SITE_OTHER): Payer: Medicare Other | Admitting: Cardiology

## 2016-12-30 ENCOUNTER — Ambulatory Visit (INDEPENDENT_AMBULATORY_CARE_PROVIDER_SITE_OTHER): Payer: Medicare Other | Admitting: *Deleted

## 2016-12-30 ENCOUNTER — Encounter: Payer: Self-pay | Admitting: Cardiology

## 2016-12-30 VITALS — BP 110/70 | HR 80 | Ht 71.0 in | Wt 186.0 lb

## 2016-12-30 DIAGNOSIS — G459 Transient cerebral ischemic attack, unspecified: Secondary | ICD-10-CM

## 2016-12-30 DIAGNOSIS — Q2112 Patent foramen ovale: Secondary | ICD-10-CM

## 2016-12-30 DIAGNOSIS — Q211 Atrial septal defect: Secondary | ICD-10-CM

## 2016-12-30 DIAGNOSIS — E118 Type 2 diabetes mellitus with unspecified complications: Secondary | ICD-10-CM | POA: Diagnosis not present

## 2016-12-30 DIAGNOSIS — I2510 Atherosclerotic heart disease of native coronary artery without angina pectoris: Secondary | ICD-10-CM

## 2016-12-30 DIAGNOSIS — E785 Hyperlipidemia, unspecified: Secondary | ICD-10-CM | POA: Diagnosis not present

## 2016-12-30 NOTE — Progress Notes (Signed)
Wound Loop check in clinic. Steri-Strips removed, Incision edges approximated, no redness or swelling. Incision well healed. Battery status: Good. R-waves 0.25-27mV.  0 symptom episodes, 0  tachy episodes, Loletha Grayer and Pause episode off at implant. 0 AF episodes . Monthly summary reports and ROV with JA PRN

## 2016-12-30 NOTE — Patient Instructions (Signed)
Medication Instructions:  The current medical regimen is effective;  continue present plan and medications.  Follow-Up: Follow up in [place] in August with Dr Percival Spanish.  If you need a refill on your cardiac medications before your next appointment, please call your pharmacy.  Thank you for choosing [facility]!!

## 2016-12-31 DIAGNOSIS — J4530 Mild persistent asthma, uncomplicated: Secondary | ICD-10-CM | POA: Diagnosis not present

## 2016-12-31 DIAGNOSIS — I129 Hypertensive chronic kidney disease with stage 1 through stage 4 chronic kidney disease, or unspecified chronic kidney disease: Secondary | ICD-10-CM | POA: Diagnosis not present

## 2016-12-31 DIAGNOSIS — M1991 Primary osteoarthritis, unspecified site: Secondary | ICD-10-CM | POA: Diagnosis not present

## 2016-12-31 DIAGNOSIS — J449 Chronic obstructive pulmonary disease, unspecified: Secondary | ICD-10-CM | POA: Diagnosis not present

## 2016-12-31 DIAGNOSIS — Z9181 History of falling: Secondary | ICD-10-CM | POA: Diagnosis not present

## 2016-12-31 DIAGNOSIS — I2510 Atherosclerotic heart disease of native coronary artery without angina pectoris: Secondary | ICD-10-CM | POA: Diagnosis not present

## 2016-12-31 DIAGNOSIS — Z95818 Presence of other cardiac implants and grafts: Secondary | ICD-10-CM | POA: Diagnosis not present

## 2016-12-31 DIAGNOSIS — Z9861 Coronary angioplasty status: Secondary | ICD-10-CM | POA: Diagnosis not present

## 2016-12-31 DIAGNOSIS — M6281 Muscle weakness (generalized): Secondary | ICD-10-CM | POA: Diagnosis not present

## 2016-12-31 DIAGNOSIS — E1122 Type 2 diabetes mellitus with diabetic chronic kidney disease: Secondary | ICD-10-CM | POA: Diagnosis not present

## 2016-12-31 DIAGNOSIS — Z8673 Personal history of transient ischemic attack (TIA), and cerebral infarction without residual deficits: Secondary | ICD-10-CM | POA: Diagnosis not present

## 2016-12-31 DIAGNOSIS — R2681 Unsteadiness on feet: Secondary | ICD-10-CM | POA: Diagnosis not present

## 2016-12-31 DIAGNOSIS — N183 Chronic kidney disease, stage 3 (moderate): Secondary | ICD-10-CM | POA: Diagnosis not present

## 2017-01-08 ENCOUNTER — Encounter (HOSPITAL_COMMUNITY): Payer: Self-pay | Admitting: Emergency Medicine

## 2017-01-08 ENCOUNTER — Emergency Department (HOSPITAL_COMMUNITY)
Admission: EM | Admit: 2017-01-08 | Discharge: 2017-01-08 | Disposition: A | Discharge status: Home / Self Care | Payer: Medicare Other | Attending: Emergency Medicine | Admitting: Emergency Medicine

## 2017-01-08 ENCOUNTER — Emergency Department (HOSPITAL_COMMUNITY): Payer: Medicare Other

## 2017-01-08 DIAGNOSIS — N183 Chronic kidney disease, stage 3 (moderate): Secondary | ICD-10-CM | POA: Diagnosis not present

## 2017-01-08 DIAGNOSIS — I129 Hypertensive chronic kidney disease with stage 1 through stage 4 chronic kidney disease, or unspecified chronic kidney disease: Secondary | ICD-10-CM | POA: Diagnosis not present

## 2017-01-08 DIAGNOSIS — G459 Transient cerebral ischemic attack, unspecified: Secondary | ICD-10-CM | POA: Diagnosis not present

## 2017-01-08 DIAGNOSIS — Z955 Presence of coronary angioplasty implant and graft: Secondary | ICD-10-CM | POA: Diagnosis not present

## 2017-01-08 DIAGNOSIS — E1122 Type 2 diabetes mellitus with diabetic chronic kidney disease: Secondary | ICD-10-CM | POA: Diagnosis not present

## 2017-01-08 DIAGNOSIS — Z7982 Long term (current) use of aspirin: Secondary | ICD-10-CM | POA: Insufficient documentation

## 2017-01-08 DIAGNOSIS — Z79899 Other long term (current) drug therapy: Secondary | ICD-10-CM | POA: Diagnosis not present

## 2017-01-08 DIAGNOSIS — J45909 Unspecified asthma, uncomplicated: Secondary | ICD-10-CM | POA: Diagnosis not present

## 2017-01-08 DIAGNOSIS — Z7984 Long term (current) use of oral hypoglycemic drugs: Secondary | ICD-10-CM | POA: Insufficient documentation

## 2017-01-08 DIAGNOSIS — Z87891 Personal history of nicotine dependence: Secondary | ICD-10-CM | POA: Diagnosis not present

## 2017-01-08 DIAGNOSIS — R479 Unspecified speech disturbances: Secondary | ICD-10-CM | POA: Diagnosis not present

## 2017-01-08 LAB — COMPREHENSIVE METABOLIC PANEL
ALBUMIN: 4.3 g/dL (ref 3.5–5.0)
ALK PHOS: 67 U/L (ref 38–126)
ALT: 19 U/L (ref 17–63)
ANION GAP: 14 (ref 5–15)
AST: 26 U/L (ref 15–41)
BILIRUBIN TOTAL: 1.3 mg/dL — AB (ref 0.3–1.2)
BUN: 11 mg/dL (ref 6–20)
CALCIUM: 9.1 mg/dL (ref 8.9–10.3)
CO2: 20 mmol/L — ABNORMAL LOW (ref 22–32)
CREATININE: 1.16 mg/dL (ref 0.61–1.24)
Chloride: 95 mmol/L — ABNORMAL LOW (ref 101–111)
GFR calc Af Amer: >60 mL/min (ref >60)
GFR calc non Af Amer: 56 mL/min — ABNORMAL LOW (ref >60)
GLUCOSE: 124 mg/dL — AB (ref 65–99)
Potassium: 4 mmol/L (ref 3.5–5.1)
Sodium: 129 mmol/L — ABNORMAL LOW (ref 135–145)
TOTAL PROTEIN: 7 g/dL (ref 6.5–8.1)

## 2017-01-08 LAB — CBC
HCT: 41.8 % (ref 39.0–52.0)
HEMOGLOBIN: 14.7 g/dL (ref 13.0–17.0)
MCH: 32.7 pg (ref 26.0–34.0)
MCHC: 35.2 g/dL (ref 30.0–36.0)
MCV: 92.9 fL (ref 78.0–100.0)
Platelets: 192 10*3/uL (ref 150–400)
RBC: 4.5 MIL/uL (ref 4.22–5.81)
RDW: 12.6 % (ref 11.5–15.5)
WBC: 9.7 10*3/uL (ref 4.0–10.5)

## 2017-01-08 LAB — PROTIME-INR
INR: 1.01
Prothrombin Time: 13.3 seconds (ref 11.4–15.2)

## 2017-01-08 LAB — DIFFERENTIAL
Basophils Absolute: 0 10*3/uL (ref 0.0–0.1)
Basophils Relative: 0 %
EOS PCT: 2 %
Eosinophils Absolute: 0.2 10*3/uL (ref 0.0–0.7)
LYMPHS ABS: 1.9 10*3/uL (ref 0.7–4.0)
LYMPHS PCT: 19 %
Monocytes Absolute: 0.6 10*3/uL (ref 0.1–1.0)
Monocytes Relative: 6 %
NEUTROS PCT: 73 %
Neutro Abs: 7 10*3/uL (ref 1.7–7.7)

## 2017-01-08 LAB — I-STAT CHEM 8, ED
BUN: 14 mg/dL (ref 6–20)
CALCIUM ION: 1.09 mmol/L — AB (ref 1.15–1.40)
CREATININE: 1.1 mg/dL (ref 0.61–1.24)
Chloride: 93 mmol/L — ABNORMAL LOW (ref 101–111)
GLUCOSE: 127 mg/dL — AB (ref 65–99)
HCT: 43 % (ref 39.0–52.0)
Hemoglobin: 14.6 g/dL (ref 13.0–17.0)
Potassium: 4.1 mmol/L (ref 3.5–5.1)
SODIUM: 130 mmol/L — AB (ref 135–145)
TCO2: 26 mmol/L (ref 0–100)

## 2017-01-08 LAB — APTT: aPTT: 29 seconds (ref 24–36)

## 2017-01-08 LAB — I-STAT TROPONIN, ED: Troponin i, poc: 0 ng/mL (ref 0.00–0.08)

## 2017-01-08 NOTE — ED Provider Notes (Signed)
[facility] DEPT Provider Note   CSN: [id number] Arrival date & time: 01/08/17  1550     History   Chief Complaint Chief Complaint  Patient presents with  . Stroke Symptoms    HPI [name] is a 83 y.o. male.  Patient admitted for acute stroke in May 2018 affecting R MCA.  Patient had no residual symptoms of stroke.  He reports that today he felt numbness in the right side of his tongue and in his right hand.  There was no associated weakness.  No symptoms in the LE.  Wife notes that his speech as somewhat slurred when she got home.  She notes that the patient reported his symptoms had started around 12 and ended around 2pm.  He notes that the numbness lasted no more than 5 minutes.  He notes his BP was 187/99.  Patient took a Hydralazine 25 mg and ASA.  He reports compliance with Lipitor 40, ASA, Plavix, BP meds and DM2 meds.    He denies dizziness, headache, visual disturbance, hearing changes, difficulty swallowing, changes in voice, weakness, numbness elsewhere, nausea, vomiting, CP, SOB.      Past Medical History:  Diagnosis Date  . Arthritis    "wrists, knees, back" (12/10/2015)  . Asthma   . Cerebral atherosclerosis   . Chronic back pain    "lower primarily" (12/10/2015)  . CORONARY ATHEROSCLEROSIS NATIVE CORONARY ARTERY    Myocardial infarction at [facility] in 1996 with a directional atherectomy of the 90% LAD stenosis and medical management of 60-70% proximal circumflex and 50% RCA stenosis.  . DYSLIPIDEMIA   . GERD (gastroesophageal reflux disease)   . History of hiatal hernia   . HYPERTENSION   . ILIAC ARTERY ANEURYSM   . Myocardial infarction ([place]) 1996  . Stroke [facility]) 2003; 2013; 07/2015; 12/05/2015   denies residual on 12/10/2015  . TIA (transient ischemic attack)   . Type II diabetes mellitus ([place])   . UPPER RESPIRATORY INFECTION     Patient Active Problem List   Diagnosis Date Noted  . PFO (patent foramen ovale) 12/30/2016  . Dyslipidemia  12/30/2016  . Cryptogenic stroke ([place])   . Numbness of tongue 12/13/2016  . Hyponatremia 12/13/2016  . GERD (gastroesophageal reflux disease) 02/25/2016  . Ischemic cerebrovascular accident (CVA) of frontal lobe ([place]) 12/15/2015  . Malnutrition of moderate degree 12/11/2015  . History of stroke 12/10/2015  . DM type 2 (diabetes mellitus, type 2) ([place]) 12/06/2015  . Dysarthria 12/05/2015  . CKD (chronic kidney disease) stage 3, GFR 30-59 ml/min 07/17/2015  . Cerebral infarction ([place]) 07/15/2015  . TIA (transient ischemic attack) 09/12/2013  . Metabolic syndrome 53/64/6803  . Vitamin D deficiency 08/01/2013  . PVD (peripheral vascular disease) ([place]) 11/25/2011  . ILIAC ARTERY ANEURYSM 07/17/2010  . Hyperlipidemia LDL goal <70 08/28/2009  . CORONARY ATHEROSCLEROSIS NATIVE CORONARY ARTERY 08/28/2009  . Essential hypertension 06/05/2009  . Cerebral atherosclerosis 06/04/2009    Past Surgical History:  Procedure Laterality Date  . ANAL FISSURE REPAIR  1990s  . BASAL CELL CARCINOMA EXCISION Right 12/2015   "temple"  . CATARACT EXTRACTION W/ INTRAOCULAR LENS  IMPLANT, BILATERAL Bilateral 2000s  . CORONARY ANGIOPLASTY  1996  . KNEE CARTILAGE SURGERY Right 1970s  . LOOP RECORDER INSERTION N/A 12/15/2016   Procedure: Loop Recorder Insertion;  Surgeon: Thompson Grayer, MD;  Location: [place] CV LAB;  Service: Cardiovascular;  Laterality: N/A;  . SHOULDER ARTHROSCOPY W/ ROTATOR CUFF REPAIR Left 2000s  . TEE WITHOUT CARDIOVERSION N/A  12/15/2016   Procedure: TRANSESOPHAGEAL ECHOCARDIOGRAM (TEE);  Surgeon: Lelon Perla, MD;  Location: [facility] ENDOSCOPY;  Service: Cardiovascular;  Laterality: N/A;       Home Medications    Prior to Admission medications   Medication Sig Start Date End Date Taking? Authorizing Provider  aspirin EC 81 MG tablet Take 1 tablet (81 mg total) by mouth daily. 12/15/16 12/15/17 Yes Vann, Jessica U, DO  atorvastatin (LIPITOR) 40 MG tablet Take 1 tablet (40 mg total)  by mouth daily. Patient taking differently: Take 40 mg by mouth every evening.  12/15/16  Yes Vann, Jessica U, DO  benazepril (LOTENSIN) 20 MG tablet TAKE ONE TABLET BY MOUTH ONCE DAILY 09/11/16  Yes Timmothy Euler, MD  Cholecalciferol (VITAMIN D) 2000 units CAPS Take 2,000 Units by mouth daily.   Yes [provider]  clopidogrel (PLAVIX) 75 MG tablet TAKE ONE TABLET BY MOUTH ONCE DAILY 10/12/16  Yes Timmothy Euler, MD  DIALYVITE VITAMIN D3 MAX 49702 units TABS TAKE ONE TABLET BY MOUTH ONCE A WEEK 09/11/16  Yes Timmothy Euler, MD  feeding supplement, GLUCERNA SHAKE, (GLUCERNA SHAKE) LIQD Take 237 mLs by mouth 2 (two) times daily between meals. 12/13/15  Yes Timmothy Euler, MD  fish oil-omega-3 fatty acids 1000 MG capsule Take 2 g by mouth daily.     Yes [provider]  Fluticasone-Salmeterol (ADVAIR DISKUS) 100-50 MCG/DOSE AEPB INHALE ONE DOSE BY MOUTH EVERY 12 HOURS 12/15/16  Yes Vann, Jessica U, DO  glucose blood test strip Check blood sugar twice daily and as needed. Dx Code 12/18/15  Yes Timmothy Euler, MD  hydrALAZINE (APRESOLINE) 25 MG tablet Take 1 tablet (25 mg total) by mouth daily as needed. 05/27/16 01/08/17 Yes Minus Breeding, MD  hydrochlorothiazide (HYDRODIURIL) 25 MG tablet TAKE ONE-HALF TABLET BY MOUTH ONCE DAILY EXCEPT ON MONDAY, [facility] AND FRIDAY TAKE A WHOLE TABLET 03/23/16  Yes Chipper Herb, MD  metFORMIN (GLUCOPHAGE-XR) 500 MG 24 hr tablet TAKE ONE TABLET BY MOUTH TWICE DAILY 03/23/16  Yes Chipper Herb, MD  metoprolol (LOPRESSOR) 100 MG tablet TAKE ONE TABLET BY MOUTH TWICE DAILY 03/23/16  Yes Chipper Herb, MD  pantoprazole (PROTONIX) 40 MG tablet Take 1 tablet (40 mg total) by mouth daily. 02/25/16  Yes Timmothy Euler, MD  PROAIR HFA 108 [phone] Base) MCG/ACT inhaler INHALE 2 PUFFS BY MOUTH 4 TIMES DAILY AS NEEDED 12/16/16  Yes Timmothy Euler, MD  diclofenac sodium (VOLTAREN) 1 % GEL Apply 2 g topically 4 (four) times daily. Patient not  taking: Reported on 01/08/2017 11/05/16   Timmothy Euler, MD  nitroGLYCERIN (NITROSTAT) 0.4 MG SL tablet Place 0.4 mg under the tongue every 5 (five) minutes as needed for chest pain.    [provider]    Family History Family History  Problem Relation Age of Onset  . Coronary artery disease Mother   . Cancer Father        lung  . Stroke Father   . Cancer Brother        lung  . Stroke Sister     Social History Social History  Substance Use Topics  . Smoking status: Former Smoker    Packs/day: 0.12    Years: 45.00    Types: Cigarettes    Quit date: 11/24/1984  . Smokeless tobacco: Former Systems developer    Types: Snuff     Comment: "dipped snuff when I was a boy"  . Alcohol use No  Allergies   Codeine; Lopid [gemfibrozil]; and Voltaren [diclofenac sodium]   Review of Systems Review of Systems  Constitutional: Negative for activity change, appetite change, chills and fever.  HENT: Negative for hearing loss, tinnitus, trouble swallowing and voice change.   Respiratory: Negative for shortness of breath.   Cardiovascular: Negative for chest pain.  Gastrointestinal: Negative for nausea and vomiting.  Musculoskeletal: Negative for gait problem.  Neurological: Positive for speech difficulty (resolved) and numbness (right tongue, right hand; resolved). Negative for dizziness, syncope, facial asymmetry, weakness and headaches.  Psychiatric/Behavioral: Negative for confusion. The patient is nervous/anxious (with MRI).    Physical Exam Updated Vital Signs BP (!) 147/83   Pulse 79   Temp 97.9 F (36.6 C) (Oral)   Resp 15   Ht 5\' 11"  (1.803 m)   Wt 88.9 kg (196 lb)   SpO2 97%   BMI 27.34 kg/m   Physical Exam  Constitutional: He is oriented to person, place, and time. He appears well-developed and well-nourished. No distress.  Accompanied by son and wife  HENT:  Head: Normocephalic and atraumatic.  Mouth/Throat: No oropharyngeal exudate.  Eyes: Conjunctivae and EOM  are normal. Pupils are equal, round, and reactive to light. No scleral icterus.  Neck: Normal range of motion. Neck supple.  Cardiovascular: Normal rate, regular rhythm and intact distal pulses.   Murmur (systolic murmur) heard. Pulmonary/Chest: Effort normal and breath sounds normal. No respiratory distress.  Abdominal: Soft. Bowel sounds are normal. He exhibits no distension. There is no tenderness.  Musculoskeletal: Normal range of motion. He exhibits no edema.  Neurological: He is alert and oriented to person, place, and time. He has normal strength. No cranial nerve deficit or sensory deficit. He exhibits normal muscle tone. Coordination normal.  Normal finger to nose test, normal heel to shin test.  Strength 5/5 throughout.  CN 2-12 grossly in tact.  Skin: Skin is warm and dry. Capillary refill takes less than 2 seconds. No rash noted. He is not diaphoretic.  Psychiatric: He has a normal mood and affect. His behavior is normal. Judgment and thought content normal.  Nursing note and vitals reviewed.  ED Treatments / Results  Labs (all labs ordered are listed, but only abnormal results are displayed) Labs Reviewed  COMPREHENSIVE METABOLIC PANEL - Abnormal; Notable for the following:       Result Value   Sodium 129 (*)    Chloride 95 (*)    CO2 20 (*)    Glucose, Bld 124 (*)    Total Bilirubin 1.3 (*)    GFR calc non Af Amer 56 (*)    All other components within normal limits  I-STAT CHEM 8, ED - Abnormal; Notable for the following:    Sodium 130 (*)    Chloride 93 (*)    Glucose, Bld 127 (*)    Calcium, Ion 1.09 (*)    All other components within normal limits  PROTIME-INR  APTT  CBC  DIFFERENTIAL  I-STAT TROPOININ, ED  CBG MONITORING, ED    EKG  EKG Interpretation None       Radiology Ct Head Wo Contrast  Result Date: 01/08/2017 CLINICAL DATA:  Difficulty with speech. Diabetes, hypertension and cerebral atherosclerosis. EXAM: CT HEAD WITHOUT CONTRAST TECHNIQUE:  Contiguous axial images were obtained from the base of the skull through the vertex without intravenous contrast. COMPARISON:  12/14/2016 FINDINGS: Brain: No evidence of acute infarction, hemorrhage, hydrocephalus, extra-axial collection or mass lesion/mass effect. Low attenuation throughout the subcortical and periventricular white  matter is identified compatible with chronic microvascular disease. Prominence of the sulci and ventricles identified compatible with brain atrophy. Vascular: No hyperdense vessel or unexpected calcification. Skull: Normal. Negative for fracture or focal lesion. Sinuses/Orbits: No acute finding. Other: None. IMPRESSION: 1. No acute intracranial abnormalities. 2. Chronic small vessel ischemic disease and brain atrophy. Electronically Signed   By: Kerby Moors M.D.   On: 01/08/2017 16:50    Procedures Procedures (including critical care time)  Medications Ordered in ED Medications - No data to display   Initial Impression / Assessment and Plan / ED Course  I have reviewed the triage vital signs and the nursing notes.  Pertinent labs & imaging results that were available during my care of the patient were reviewed by me and considered in my medical decision making (see chart for details).    1713: VSS.  CT head negative.  BMP notable for Na 129. BG stable.  He looks to have had hyponatremia during hospitalization as well. CBC unremarkable.  iTrop negative.  EKG NSR without evidence of ischemia.  Last hospitalization reviewed.  Patient thought to have embolic CVA.  PFO incidentally found on echo.  He has loop recorder in place.  He is currently medically optimized.  Given h/o CVA in 12/2016, c/s neuro placed for further recommendations.    1813: Discussed patient with Dr Shon Hale, on call Neurologist.  He recommends no additional imaging or formal consult at this time given recent work up.  He recommends outpatient follow up as scheduled w/ outpatient Neurology.  Final  Clinical Impressions(s) / ED Diagnoses   Final diagnoses:  Transient cerebral ischemia, unspecified type   [name] is a 83 y.o. male that presents to ED after having numbness in the right tongue and right hand that resolved prior to ED.  He had associated dysarthria that also resolved.  PMH significant for admission for CVA in 12/2016, CAD, HTN, HLD, DM2, carotid stenosis and PFO.  He has a loop monitor in place and is followed by Dr Percival Spanish and Dr Leonie Man outpatient.  On exam, patient without focal neurologic deficit.  CT head negative for acute processes.  EKG NSR.  Care was discussed with Neurology, who recommended no additional work up at this time for presumed TIA.  He recommended keeping outpatient follow up with Dr Leonie Man.  This was reviewed with patient, who voiced good understanding.  Strict return precautions were reviewed.  Patient was discharged in stable condition home with his wife and son.  New Prescriptions New Prescriptions   No medications on file     Janora Norlander, DO 01/08/17 1840    Margette Fast, MD 01/09/17 1053

## 2017-01-08 NOTE — ED Triage Notes (Signed)
Pt to ED from home c/o dysarthria x 3 hours - pt reports it started at noon, was at home by himself and then called his wife at 2pm telling her what was happening. Pt states it resolved about an hour ago, he now presents with no focal deficits. Pt was admitted last month for similar, had stroke but no deficits. Pt currently A&O x 4, grips strong and equal, talking in full sentences, no slurred speech.

## 2017-01-08 NOTE — Discharge Instructions (Signed)
You were seen in the ED for stroke like symptoms that resolved.  You likely had a TIA.  Your care was discussed with Dr Shon Hale, Neurologist, here in the ED.  He recommends no further work up at this time and recommends that you follow up with Dr Leonie Man as scheduled.  If you develop any recurring symptoms or worsening symptoms, please seek immediate medical attention.

## 2017-01-11 ENCOUNTER — Other Ambulatory Visit: Payer: Self-pay | Admitting: Family Medicine

## 2017-01-11 DIAGNOSIS — I10 Essential (primary) hypertension: Secondary | ICD-10-CM

## 2017-01-12 ENCOUNTER — Telehealth: Payer: Self-pay | Admitting: Neurology

## 2017-01-12 DIAGNOSIS — L84 Corns and callosities: Secondary | ICD-10-CM | POA: Diagnosis not present

## 2017-01-12 DIAGNOSIS — E119 Type 2 diabetes mellitus without complications: Secondary | ICD-10-CM | POA: Diagnosis not present

## 2017-01-12 DIAGNOSIS — B351 Tinea unguium: Secondary | ICD-10-CM | POA: Diagnosis not present

## 2017-01-12 DIAGNOSIS — M79676 Pain in unspecified toe(s): Secondary | ICD-10-CM | POA: Diagnosis not present

## 2017-01-12 NOTE — Telephone Encounter (Signed)
Pt has appt in July 11,2018 with the NP. Pt was in hospital January 08, 2017 with stroke like symptoms.

## 2017-01-12 NOTE — Telephone Encounter (Signed)
Pt's wife called said pt was at [facility] ED 01/08/17 with stroke-like symptoms and was advised at discharge to let Dr Leonie Man know.

## 2017-01-14 ENCOUNTER — Ambulatory Visit (INDEPENDENT_AMBULATORY_CARE_PROVIDER_SITE_OTHER): Payer: Medicare Other | Admitting: *Deleted

## 2017-01-14 DIAGNOSIS — G459 Transient cerebral ischemic attack, unspecified: Secondary | ICD-10-CM | POA: Diagnosis not present

## 2017-01-15 ENCOUNTER — Ambulatory Visit (INDEPENDENT_AMBULATORY_CARE_PROVIDER_SITE_OTHER): Payer: Medicare Other | Admitting: Family Medicine

## 2017-01-15 ENCOUNTER — Encounter: Payer: Self-pay | Admitting: Family Medicine

## 2017-01-15 VITALS — BP 127/77 | HR 72 | Temp 96.7°F | Ht 71.0 in | Wt 187.6 lb

## 2017-01-15 DIAGNOSIS — G479 Sleep disorder, unspecified: Secondary | ICD-10-CM | POA: Diagnosis not present

## 2017-01-15 DIAGNOSIS — E871 Hypo-osmolality and hyponatremia: Secondary | ICD-10-CM | POA: Diagnosis not present

## 2017-01-15 DIAGNOSIS — G459 Transient cerebral ischemic attack, unspecified: Secondary | ICD-10-CM

## 2017-01-15 LAB — BMP8+EGFR
BUN/Creatinine Ratio: 11 (ref 10–24)
BUN: 14 mg/dL (ref 8–27)
CO2: 23 mmol/L (ref 20–29)
Calcium: 9.9 mg/dL (ref 8.6–10.2)
Chloride: 91 mmol/L — ABNORMAL LOW (ref 96–106)
Creatinine, Ser: 1.23 mg/dL (ref 0.76–1.27)
GFR calc Af Amer: 62 mL/min/{1.73_m2} (ref >59)
GFR, EST NON AFRICAN AMERICAN: 54 mL/min/{1.73_m2} — AB (ref >59)
GLUCOSE: 150 mg/dL — AB (ref 65–99)
POTASSIUM: 4.1 mmol/L (ref 3.5–5.2)
SODIUM: 131 mmol/L — AB (ref 134–144)

## 2017-01-15 MED ORDER — TRAZODONE HCL 50 MG PO TABS: 25.0000 mg | ORAL_TABLET | Freq: Every evening | ORAL | 3 refills | Status: DC | PRN

## 2017-01-15 NOTE — Patient Instructions (Signed)
Great to see you!  Come back in 2 months unless you need [place] sooner.   Try 1/2 to 1 tab of trazodone for sleep as needed.

## 2017-01-15 NOTE — Progress Notes (Signed)
   HPI  Patient presents today here for ER follow-up.  Patient was seen in emergency room for TIA. He was evaluated with EKG and CT scan. He had a recent full workup for TIA/stroke. He was not admitted due to resolution of symptoms and no need for additional workup.  Patient feels well since going home. No additional dysarthria or symptoms consistent with TIA/stroke. He does have generalized fatigue and weakness, he is also struggled with some hoarseness since his TEE.  He tried melatonin for sleep, however this did not help. Patient states that intermittently he sleeps well, he needs help sometimes.  PMH: Smoking status noted ROS: Per HPI  Objective: BP 127/77   Pulse 72   Temp (!) 96.7 F (35.9 C) (Oral)   Ht '5\' 11"'$  (1.803 m)   Wt 187 lb 9.6 oz (85.1 kg)   BMI 26.16 kg/m  Gen: NAD, alert, cooperative with exam HEENT: NCAT, hoarse CV: RRR, good S1/S2, no murmur Resp: CTABL, no wheezes, non-labored Ext: No edema, warm Neuro: Alert and oriented  Assessment and plan:  # TIA No current symptoms Patient has had recent full workup Continue statin, Plavix, aggressive blood pressure control  # Difficulty sleeping Melatonin not helpful, trial of trazodone a low-dose  # Hyponatremia Mild, repeat labs. With fatigue I am concerned that he may have had a change here.     Orders Placed This Encounter  Procedures  . BMP8+EGFR    Meds ordered this encounter  Medications  . DISCONTD: MELATONIN PO    Sig: Take by mouth.  . DISCONTD: traZODone (DESYREL) 50 MG tablet    Sig: Take 0.5-1 tablets (25-50 mg total) by mouth at bedtime as needed for sleep.    Dispense:  30 tablet    Refill:  3  . traZODone (DESYREL) 50 MG tablet    Sig: Take 0.5-1 tablets (25-50 mg total) by mouth at bedtime as needed for sleep.    Dispense:  30 tablet    Refill:  Wallace, MD [place] 01/15/2017, 9:44 AM

## 2017-01-15 NOTE — Progress Notes (Signed)
Carelink Summary Report / Loop Recorder 

## 2017-01-19 DIAGNOSIS — J449 Chronic obstructive pulmonary disease, unspecified: Secondary | ICD-10-CM | POA: Diagnosis not present

## 2017-01-19 DIAGNOSIS — Z95818 Presence of other cardiac implants and grafts: Secondary | ICD-10-CM | POA: Diagnosis not present

## 2017-01-19 DIAGNOSIS — N183 Chronic kidney disease, stage 3 (moderate): Secondary | ICD-10-CM | POA: Diagnosis not present

## 2017-01-19 DIAGNOSIS — I2510 Atherosclerotic heart disease of native coronary artery without angina pectoris: Secondary | ICD-10-CM | POA: Diagnosis not present

## 2017-01-19 DIAGNOSIS — R2681 Unsteadiness on feet: Secondary | ICD-10-CM | POA: Diagnosis not present

## 2017-01-19 DIAGNOSIS — Z8673 Personal history of transient ischemic attack (TIA), and cerebral infarction without residual deficits: Secondary | ICD-10-CM | POA: Diagnosis not present

## 2017-01-19 DIAGNOSIS — Z9861 Coronary angioplasty status: Secondary | ICD-10-CM | POA: Diagnosis not present

## 2017-01-19 DIAGNOSIS — M1991 Primary osteoarthritis, unspecified site: Secondary | ICD-10-CM | POA: Diagnosis not present

## 2017-01-19 DIAGNOSIS — I129 Hypertensive chronic kidney disease with stage 1 through stage 4 chronic kidney disease, or unspecified chronic kidney disease: Secondary | ICD-10-CM | POA: Diagnosis not present

## 2017-01-19 DIAGNOSIS — E1122 Type 2 diabetes mellitus with diabetic chronic kidney disease: Secondary | ICD-10-CM | POA: Diagnosis not present

## 2017-01-19 DIAGNOSIS — Z9181 History of falling: Secondary | ICD-10-CM | POA: Diagnosis not present

## 2017-01-19 DIAGNOSIS — M6281 Muscle weakness (generalized): Secondary | ICD-10-CM | POA: Diagnosis not present

## 2017-01-19 DIAGNOSIS — J4530 Mild persistent asthma, uncomplicated: Secondary | ICD-10-CM | POA: Diagnosis not present

## 2017-01-21 DIAGNOSIS — M1991 Primary osteoarthritis, unspecified site: Secondary | ICD-10-CM | POA: Diagnosis not present

## 2017-01-21 DIAGNOSIS — E1122 Type 2 diabetes mellitus with diabetic chronic kidney disease: Secondary | ICD-10-CM | POA: Diagnosis not present

## 2017-01-21 DIAGNOSIS — Z8673 Personal history of transient ischemic attack (TIA), and cerebral infarction without residual deficits: Secondary | ICD-10-CM | POA: Diagnosis not present

## 2017-01-21 DIAGNOSIS — M6281 Muscle weakness (generalized): Secondary | ICD-10-CM | POA: Diagnosis not present

## 2017-01-21 DIAGNOSIS — J4530 Mild persistent asthma, uncomplicated: Secondary | ICD-10-CM | POA: Diagnosis not present

## 2017-01-21 DIAGNOSIS — R2681 Unsteadiness on feet: Secondary | ICD-10-CM | POA: Diagnosis not present

## 2017-01-21 DIAGNOSIS — Z95818 Presence of other cardiac implants and grafts: Secondary | ICD-10-CM | POA: Diagnosis not present

## 2017-01-21 DIAGNOSIS — J449 Chronic obstructive pulmonary disease, unspecified: Secondary | ICD-10-CM | POA: Diagnosis not present

## 2017-01-21 DIAGNOSIS — I129 Hypertensive chronic kidney disease with stage 1 through stage 4 chronic kidney disease, or unspecified chronic kidney disease: Secondary | ICD-10-CM | POA: Diagnosis not present

## 2017-01-21 DIAGNOSIS — N183 Chronic kidney disease, stage 3 (moderate): Secondary | ICD-10-CM | POA: Diagnosis not present

## 2017-01-21 DIAGNOSIS — Z9181 History of falling: Secondary | ICD-10-CM | POA: Diagnosis not present

## 2017-01-21 DIAGNOSIS — I2510 Atherosclerotic heart disease of native coronary artery without angina pectoris: Secondary | ICD-10-CM | POA: Diagnosis not present

## 2017-01-21 DIAGNOSIS — Z9861 Coronary angioplasty status: Secondary | ICD-10-CM | POA: Diagnosis not present

## 2017-01-25 DIAGNOSIS — Z8673 Personal history of transient ischemic attack (TIA), and cerebral infarction without residual deficits: Secondary | ICD-10-CM | POA: Diagnosis not present

## 2017-01-25 DIAGNOSIS — J449 Chronic obstructive pulmonary disease, unspecified: Secondary | ICD-10-CM | POA: Diagnosis not present

## 2017-01-25 DIAGNOSIS — Z9181 History of falling: Secondary | ICD-10-CM | POA: Diagnosis not present

## 2017-01-25 DIAGNOSIS — I2510 Atherosclerotic heart disease of native coronary artery without angina pectoris: Secondary | ICD-10-CM | POA: Diagnosis not present

## 2017-01-25 DIAGNOSIS — Z9861 Coronary angioplasty status: Secondary | ICD-10-CM | POA: Diagnosis not present

## 2017-01-25 DIAGNOSIS — Z95818 Presence of other cardiac implants and grafts: Secondary | ICD-10-CM | POA: Diagnosis not present

## 2017-01-25 DIAGNOSIS — E1122 Type 2 diabetes mellitus with diabetic chronic kidney disease: Secondary | ICD-10-CM | POA: Diagnosis not present

## 2017-01-25 DIAGNOSIS — R2681 Unsteadiness on feet: Secondary | ICD-10-CM | POA: Diagnosis not present

## 2017-01-25 DIAGNOSIS — N183 Chronic kidney disease, stage 3 (moderate): Secondary | ICD-10-CM | POA: Diagnosis not present

## 2017-01-25 DIAGNOSIS — M6281 Muscle weakness (generalized): Secondary | ICD-10-CM | POA: Diagnosis not present

## 2017-01-25 DIAGNOSIS — M1991 Primary osteoarthritis, unspecified site: Secondary | ICD-10-CM | POA: Diagnosis not present

## 2017-01-25 DIAGNOSIS — J4530 Mild persistent asthma, uncomplicated: Secondary | ICD-10-CM | POA: Diagnosis not present

## 2017-01-25 DIAGNOSIS — I129 Hypertensive chronic kidney disease with stage 1 through stage 4 chronic kidney disease, or unspecified chronic kidney disease: Secondary | ICD-10-CM | POA: Diagnosis not present

## 2017-01-25 LAB — CUP PACEART REMOTE DEVICE CHECK
Implantable Pulse Generator Implant Date: 20180515
MDC IDC SESS DTM: 20180614200830

## 2017-01-25 NOTE — Progress Notes (Signed)
Carelink summary report received. Battery status OK. Normal device function. No new symptom episodes, tachy episodes, brady, or pause episodes. No new AF episodes. Monthly summary reports and ROV/PRN 

## 2017-01-26 ENCOUNTER — Other Ambulatory Visit: Payer: Self-pay | Admitting: Dermatology

## 2017-01-26 DIAGNOSIS — D492 Neoplasm of unspecified behavior of bone, soft tissue, and skin: Secondary | ICD-10-CM | POA: Diagnosis not present

## 2017-01-26 DIAGNOSIS — C44319 Basal cell carcinoma of skin of other parts of face: Secondary | ICD-10-CM | POA: Diagnosis not present

## 2017-01-26 DIAGNOSIS — L570 Actinic keratosis: Secondary | ICD-10-CM | POA: Diagnosis not present

## 2017-01-26 DIAGNOSIS — C44329 Squamous cell carcinoma of skin of other parts of face: Secondary | ICD-10-CM | POA: Diagnosis not present

## 2017-01-26 DIAGNOSIS — D0439 Carcinoma in situ of skin of other parts of face: Secondary | ICD-10-CM | POA: Diagnosis not present

## 2017-01-26 DIAGNOSIS — C4491 Basal cell carcinoma of skin, unspecified: Secondary | ICD-10-CM

## 2017-01-26 HISTORY — DX: Basal cell carcinoma of skin, unspecified: C44.91

## 2017-01-27 ENCOUNTER — Telehealth: Payer: Self-pay | Admitting: Cardiology

## 2017-01-27 DIAGNOSIS — J4530 Mild persistent asthma, uncomplicated: Secondary | ICD-10-CM | POA: Diagnosis not present

## 2017-01-27 DIAGNOSIS — Z8673 Personal history of transient ischemic attack (TIA), and cerebral infarction without residual deficits: Secondary | ICD-10-CM | POA: Diagnosis not present

## 2017-01-27 DIAGNOSIS — Z9181 History of falling: Secondary | ICD-10-CM | POA: Diagnosis not present

## 2017-01-27 DIAGNOSIS — E1122 Type 2 diabetes mellitus with diabetic chronic kidney disease: Secondary | ICD-10-CM | POA: Diagnosis not present

## 2017-01-27 DIAGNOSIS — J449 Chronic obstructive pulmonary disease, unspecified: Secondary | ICD-10-CM | POA: Diagnosis not present

## 2017-01-27 DIAGNOSIS — M1991 Primary osteoarthritis, unspecified site: Secondary | ICD-10-CM | POA: Diagnosis not present

## 2017-01-27 DIAGNOSIS — M6281 Muscle weakness (generalized): Secondary | ICD-10-CM | POA: Diagnosis not present

## 2017-01-27 DIAGNOSIS — I2510 Atherosclerotic heart disease of native coronary artery without angina pectoris: Secondary | ICD-10-CM | POA: Diagnosis not present

## 2017-01-27 DIAGNOSIS — I129 Hypertensive chronic kidney disease with stage 1 through stage 4 chronic kidney disease, or unspecified chronic kidney disease: Secondary | ICD-10-CM | POA: Diagnosis not present

## 2017-01-27 DIAGNOSIS — N183 Chronic kidney disease, stage 3 (moderate): Secondary | ICD-10-CM | POA: Diagnosis not present

## 2017-01-27 DIAGNOSIS — Z9861 Coronary angioplasty status: Secondary | ICD-10-CM | POA: Diagnosis not present

## 2017-01-27 DIAGNOSIS — R2681 Unsteadiness on feet: Secondary | ICD-10-CM | POA: Diagnosis not present

## 2017-01-27 DIAGNOSIS — Z95818 Presence of other cardiac implants and grafts: Secondary | ICD-10-CM | POA: Diagnosis not present

## 2017-01-27 NOTE — Telephone Encounter (Signed)
Spoke w/ pt and requested that he send a manual transmission b/c his home monitor has not updated in at least 14 days.   

## 2017-01-28 DIAGNOSIS — C4491 Basal cell carcinoma of skin, unspecified: Secondary | ICD-10-CM

## 2017-01-28 HISTORY — DX: Basal cell carcinoma of skin, unspecified: C44.91

## 2017-02-01 DIAGNOSIS — R2681 Unsteadiness on feet: Secondary | ICD-10-CM | POA: Diagnosis not present

## 2017-02-01 DIAGNOSIS — Z9181 History of falling: Secondary | ICD-10-CM | POA: Diagnosis not present

## 2017-02-01 DIAGNOSIS — Z95818 Presence of other cardiac implants and grafts: Secondary | ICD-10-CM | POA: Diagnosis not present

## 2017-02-01 DIAGNOSIS — J449 Chronic obstructive pulmonary disease, unspecified: Secondary | ICD-10-CM | POA: Diagnosis not present

## 2017-02-01 DIAGNOSIS — J4530 Mild persistent asthma, uncomplicated: Secondary | ICD-10-CM | POA: Diagnosis not present

## 2017-02-01 DIAGNOSIS — Z8673 Personal history of transient ischemic attack (TIA), and cerebral infarction without residual deficits: Secondary | ICD-10-CM | POA: Diagnosis not present

## 2017-02-01 DIAGNOSIS — M6281 Muscle weakness (generalized): Secondary | ICD-10-CM | POA: Diagnosis not present

## 2017-02-01 DIAGNOSIS — E1122 Type 2 diabetes mellitus with diabetic chronic kidney disease: Secondary | ICD-10-CM | POA: Diagnosis not present

## 2017-02-01 DIAGNOSIS — I129 Hypertensive chronic kidney disease with stage 1 through stage 4 chronic kidney disease, or unspecified chronic kidney disease: Secondary | ICD-10-CM | POA: Diagnosis not present

## 2017-02-01 DIAGNOSIS — Z9861 Coronary angioplasty status: Secondary | ICD-10-CM | POA: Diagnosis not present

## 2017-02-01 DIAGNOSIS — M1991 Primary osteoarthritis, unspecified site: Secondary | ICD-10-CM | POA: Diagnosis not present

## 2017-02-01 DIAGNOSIS — I2510 Atherosclerotic heart disease of native coronary artery without angina pectoris: Secondary | ICD-10-CM | POA: Diagnosis not present

## 2017-02-01 DIAGNOSIS — N183 Chronic kidney disease, stage 3 (moderate): Secondary | ICD-10-CM | POA: Diagnosis not present

## 2017-02-05 ENCOUNTER — Other Ambulatory Visit: Payer: Self-pay | Admitting: Family Medicine

## 2017-02-05 DIAGNOSIS — Z95818 Presence of other cardiac implants and grafts: Secondary | ICD-10-CM | POA: Diagnosis not present

## 2017-02-05 DIAGNOSIS — Z9181 History of falling: Secondary | ICD-10-CM | POA: Diagnosis not present

## 2017-02-05 DIAGNOSIS — M1991 Primary osteoarthritis, unspecified site: Secondary | ICD-10-CM | POA: Diagnosis not present

## 2017-02-05 DIAGNOSIS — M6281 Muscle weakness (generalized): Secondary | ICD-10-CM | POA: Diagnosis not present

## 2017-02-05 DIAGNOSIS — J4530 Mild persistent asthma, uncomplicated: Secondary | ICD-10-CM | POA: Diagnosis not present

## 2017-02-05 DIAGNOSIS — N183 Chronic kidney disease, stage 3 (moderate): Secondary | ICD-10-CM | POA: Diagnosis not present

## 2017-02-05 DIAGNOSIS — Z8673 Personal history of transient ischemic attack (TIA), and cerebral infarction without residual deficits: Secondary | ICD-10-CM | POA: Diagnosis not present

## 2017-02-05 DIAGNOSIS — R2681 Unsteadiness on feet: Secondary | ICD-10-CM | POA: Diagnosis not present

## 2017-02-05 DIAGNOSIS — I2510 Atherosclerotic heart disease of native coronary artery without angina pectoris: Secondary | ICD-10-CM | POA: Diagnosis not present

## 2017-02-05 DIAGNOSIS — E1122 Type 2 diabetes mellitus with diabetic chronic kidney disease: Secondary | ICD-10-CM | POA: Diagnosis not present

## 2017-02-05 DIAGNOSIS — J449 Chronic obstructive pulmonary disease, unspecified: Secondary | ICD-10-CM | POA: Diagnosis not present

## 2017-02-05 DIAGNOSIS — Z9861 Coronary angioplasty status: Secondary | ICD-10-CM | POA: Diagnosis not present

## 2017-02-05 DIAGNOSIS — I129 Hypertensive chronic kidney disease with stage 1 through stage 4 chronic kidney disease, or unspecified chronic kidney disease: Secondary | ICD-10-CM | POA: Diagnosis not present

## 2017-02-08 DIAGNOSIS — Z95818 Presence of other cardiac implants and grafts: Secondary | ICD-10-CM | POA: Diagnosis not present

## 2017-02-08 DIAGNOSIS — I2510 Atherosclerotic heart disease of native coronary artery without angina pectoris: Secondary | ICD-10-CM | POA: Diagnosis not present

## 2017-02-08 DIAGNOSIS — R2681 Unsteadiness on feet: Secondary | ICD-10-CM | POA: Diagnosis not present

## 2017-02-08 DIAGNOSIS — E1122 Type 2 diabetes mellitus with diabetic chronic kidney disease: Secondary | ICD-10-CM | POA: Diagnosis not present

## 2017-02-08 DIAGNOSIS — J449 Chronic obstructive pulmonary disease, unspecified: Secondary | ICD-10-CM | POA: Diagnosis not present

## 2017-02-08 DIAGNOSIS — Z9861 Coronary angioplasty status: Secondary | ICD-10-CM | POA: Diagnosis not present

## 2017-02-08 DIAGNOSIS — Z8673 Personal history of transient ischemic attack (TIA), and cerebral infarction without residual deficits: Secondary | ICD-10-CM | POA: Diagnosis not present

## 2017-02-08 DIAGNOSIS — I129 Hypertensive chronic kidney disease with stage 1 through stage 4 chronic kidney disease, or unspecified chronic kidney disease: Secondary | ICD-10-CM | POA: Diagnosis not present

## 2017-02-08 DIAGNOSIS — Z9181 History of falling: Secondary | ICD-10-CM | POA: Diagnosis not present

## 2017-02-08 DIAGNOSIS — M6281 Muscle weakness (generalized): Secondary | ICD-10-CM | POA: Diagnosis not present

## 2017-02-08 DIAGNOSIS — J4530 Mild persistent asthma, uncomplicated: Secondary | ICD-10-CM | POA: Diagnosis not present

## 2017-02-08 DIAGNOSIS — N183 Chronic kidney disease, stage 3 (moderate): Secondary | ICD-10-CM | POA: Diagnosis not present

## 2017-02-08 DIAGNOSIS — M1991 Primary osteoarthritis, unspecified site: Secondary | ICD-10-CM | POA: Diagnosis not present

## 2017-02-09 ENCOUNTER — Encounter: Payer: Self-pay | Admitting: Family Medicine

## 2017-02-09 ENCOUNTER — Ambulatory Visit (INDEPENDENT_AMBULATORY_CARE_PROVIDER_SITE_OTHER): Payer: Medicare Other | Admitting: Family Medicine

## 2017-02-09 VITALS — BP 182/93 | HR 70 | Temp 96.9°F | Ht 71.0 in | Wt 192.2 lb

## 2017-02-09 DIAGNOSIS — I10 Essential (primary) hypertension: Secondary | ICD-10-CM

## 2017-02-09 DIAGNOSIS — E1159 Type 2 diabetes mellitus with other circulatory complications: Secondary | ICD-10-CM | POA: Diagnosis not present

## 2017-02-09 DIAGNOSIS — G8929 Other chronic pain: Secondary | ICD-10-CM

## 2017-02-09 DIAGNOSIS — M25569 Pain in unspecified knee: Secondary | ICD-10-CM | POA: Diagnosis not present

## 2017-02-09 NOTE — Progress Notes (Signed)
   HPI  Patient presents today here for follow-up chronic medical conditions.  Hypertension Patient has been having very controlled blood pressures at home averaging 116I systolic. This morning it was 353P systolic. He denies any headache, chest pain, dyspnea, or numbness or tingling.  He is taking all his medications as prescribed.  Diabetes Patient reports fasting blood sugar ranging from 100-120. No hypoglycemia.  Knee pain Patient is interested in having knee replacement, orthopedics is in agreement with this, however with his recent CVA he understands that he is at high risk for repeat symptoms. He has neurology follow-up tomorrow and cardiology later next month.  PMH: Smoking status noted ROS: Per HPI  Objective: BP (!) 182/93   Pulse 70   Temp (!) 96.9 F (36.1 C) (Oral)   Ht 5\' 11"  (1.803 m)   Wt 192 lb 3.2 oz (87.2 kg)   BMI 26.81 kg/m  Gen: NAD, alert, cooperative with exam HEENT: NCAT CV: RRR, good S1/S2, no murmur Resp: CTABL, no wheezes, non-labored Ext: No edema, warm Neuro: Alert and oriented, No gross deficits  Assessment and plan:  # T2DM Well controlled, A1C due in 1 month, RTC in 2 months   # Hypertension Elevated today, however no red flags and usually very well controlled Patient is post follow-up neurology appointment tomorrow. No Changes  # Knee pain Pt would like to consider knee replacement, understands increased risk He has neuro and cardiology follow up planned, appreciate their eval.     Laroy Apple, MD [facility] 02/09/2017, 9:42 AM

## 2017-02-09 NOTE — Patient Instructions (Addendum)
Great to see you!  Be sure to ask Dr. Leonie Man what his opinion about knee surgery is.

## 2017-02-10 ENCOUNTER — Ambulatory Visit (INDEPENDENT_AMBULATORY_CARE_PROVIDER_SITE_OTHER): Payer: Medicare Other | Admitting: Nurse Practitioner

## 2017-02-10 ENCOUNTER — Encounter: Payer: Self-pay | Admitting: Nurse Practitioner

## 2017-02-10 VITALS — BP 151/81 | HR 68 | Wt 192.8 lb

## 2017-02-10 DIAGNOSIS — M1991 Primary osteoarthritis, unspecified site: Secondary | ICD-10-CM | POA: Diagnosis not present

## 2017-02-10 DIAGNOSIS — I10 Essential (primary) hypertension: Secondary | ICD-10-CM

## 2017-02-10 DIAGNOSIS — R2681 Unsteadiness on feet: Secondary | ICD-10-CM | POA: Diagnosis not present

## 2017-02-10 DIAGNOSIS — E785 Hyperlipidemia, unspecified: Secondary | ICD-10-CM

## 2017-02-10 DIAGNOSIS — I129 Hypertensive chronic kidney disease with stage 1 through stage 4 chronic kidney disease, or unspecified chronic kidney disease: Secondary | ICD-10-CM | POA: Diagnosis not present

## 2017-02-10 DIAGNOSIS — M6281 Muscle weakness (generalized): Secondary | ICD-10-CM | POA: Diagnosis not present

## 2017-02-10 DIAGNOSIS — I639 Cerebral infarction, unspecified: Secondary | ICD-10-CM

## 2017-02-10 DIAGNOSIS — G459 Transient cerebral ischemic attack, unspecified: Secondary | ICD-10-CM

## 2017-02-10 DIAGNOSIS — I2510 Atherosclerotic heart disease of native coronary artery without angina pectoris: Secondary | ICD-10-CM | POA: Diagnosis not present

## 2017-02-10 DIAGNOSIS — E1122 Type 2 diabetes mellitus with diabetic chronic kidney disease: Secondary | ICD-10-CM | POA: Diagnosis not present

## 2017-02-10 DIAGNOSIS — Z95818 Presence of other cardiac implants and grafts: Secondary | ICD-10-CM | POA: Diagnosis not present

## 2017-02-10 DIAGNOSIS — Z9861 Coronary angioplasty status: Secondary | ICD-10-CM | POA: Diagnosis not present

## 2017-02-10 DIAGNOSIS — J4530 Mild persistent asthma, uncomplicated: Secondary | ICD-10-CM | POA: Diagnosis not present

## 2017-02-10 DIAGNOSIS — Z9181 History of falling: Secondary | ICD-10-CM | POA: Diagnosis not present

## 2017-02-10 DIAGNOSIS — Z8673 Personal history of transient ischemic attack (TIA), and cerebral infarction without residual deficits: Secondary | ICD-10-CM | POA: Diagnosis not present

## 2017-02-10 DIAGNOSIS — N183 Chronic kidney disease, stage 3 (moderate): Secondary | ICD-10-CM | POA: Diagnosis not present

## 2017-02-10 DIAGNOSIS — J449 Chronic obstructive pulmonary disease, unspecified: Secondary | ICD-10-CM | POA: Diagnosis not present

## 2017-02-10 NOTE — Progress Notes (Signed)
[facility]  PATIENT: [name] DOB: 1930-06-07   REASON FOR VISIT: hospital follow up for stroke right MCA infarcts,  HISTORY FROM:patient     HISTORY OF PRESENT ILLNESS:FROM RECORD Matthew C Wilsonis a 83 y.o.malewho reports that he experienced transient left tongue numbness. His family who were with him noticed that his left face drooped and his speech became thick. This has happened twice today. It is currently completely back to baseline. It happened also last Thursday. He has a history of multiple strokes, but has not had one since at least last a year. He is currently at baseline. Patient was not administered IV t-PA secondary to resolution of symptoms. He was admitted for further evaluation and treatment.He presented with transient numbness of the left border of his tongue and facial droop and slurred speech which is improving. He has history of multiple strokes in the past which were felt to be small vessel disease and he has been on aspirin, Aggrenox, aspirin and Plavix and more recently Plavix alone.12/14/16  MRI of the brain with several small right MCA infarcts, small vessel disease and atrophy. MRA no large vessel occlusion. 2-D echo 60-65% no source of embolus. LDL 49 hemoglobin A1c 6.3. Loop recorder placed. Interval history: 07/11/2018CM Mr. Shell, 83 year old male returns for hospital follow-up. He remains on Plavix and aspirin for secondary stroke prevention. He has minimal bruising or bleeding He had an ER visit on 01/08/2017 for numbness on the right side of the face and the right hand. No changes were made in medication regimen  he was not admitted. His symptoms have resolved by the time  he was evaluated. He remains on Lipitor for hyperlipidemia without complaints of myalgias. Blood pressure in the office today 151/81. Loop recorder has been placed that so far no evidence of arrhythmias. He returns for reevaluation  Patient was initially evaluated after  a stroke in December 2016. He was readmitted on May 2017 with recurrent dysarthria and MRI showed a small left frontal lacunar infarct REVIEW OF SYSTEMS: Full 14 system review of systems performed and notable only for those listed, all others are neg:  Constitutional: neg  Cardiovascular: neg Ear/Nose/Throat: neg  Skin: neg Eyes: neg Respiratory: neg Gastroitestinal: neg  Hematology/Lymphatic: neg  Endocrine: neg Musculoskeletal Right knee pain llergy/Immunology: neg Neurological: neg Psychiatric: neg Sleep : neg   ALLERGIES: Allergies  Allergen Reactions  . Codeine Other (See Comments)    Bad dreams  . Lopid [Gemfibrozil] Nausea Only  . Voltaren [Diclofenac Sodium] Diarrhea    HOME MEDICATIONS: Outpatient Medications Prior to Visit  Medication Sig Dispense Refill  . aspirin EC 81 MG tablet Take 1 tablet (81 mg total) by mouth daily. 150 tablet 2  . atorvastatin (LIPITOR) 40 MG tablet Take 1 tablet (40 mg total) by mouth daily. (Patient taking differently: Take 40 mg by mouth every evening. ) 90 tablet 0  . BAYER CONTOUR TEST test strip USE ONE STRIP TO CHECK GLUCOSE TWICE DAILY AND  AS  NEEDED 100 each 11  . benazepril (LOTENSIN) 20 MG tablet TAKE 1 TABLET BY MOUTH ONCE DAILY 90 tablet 1  . clopidogrel (PLAVIX) 75 MG tablet TAKE ONE TABLET BY MOUTH ONCE DAILY 90 tablet 3  . DIALYVITE VITAMIN D3 MAX 16073 units TABS TAKE ONE TABLET BY MOUTH ONCE A WEEK 12 tablet 0  . feeding supplement, GLUCERNA SHAKE, (GLUCERNA SHAKE) LIQD Take 237 mLs by mouth 2 (two) times daily between meals. 60 Can 11  . fish  oil-omega-3 fatty acids 1000 MG capsule Take 2 g by mouth daily.      . Fluticasone-Salmeterol (ADVAIR DISKUS) 100-50 MCG/DOSE AEPB INHALE ONE DOSE BY MOUTH EVERY 12 HOURS 180 each 2  . hydrochlorothiazide (HYDRODIURIL) 25 MG tablet TAKE ONE-HALF TABLET BY MOUTH ONCE DAILY EXCEPT ON MONDAY, WEDNESDAY AND FRIDAY TAKE A WHOLE TABLET 90 tablet 3  . metFORMIN (GLUCOPHAGE-XR) 500 MG 24 hr  tablet TAKE ONE TABLET BY MOUTH TWICE DAILY 180 tablet 3  . metoprolol (LOPRESSOR) 100 MG tablet TAKE ONE TABLET BY MOUTH TWICE DAILY 180 tablet 3  . nitroGLYCERIN (NITROSTAT) 0.4 MG SL tablet Place 0.4 mg under the tongue every 5 (five) minutes as needed for chest pain.    . pantoprazole (PROTONIX) 40 MG tablet Take 1 tablet (40 mg total) by mouth daily. 30 tablet 11  . PROAIR HFA 108 (90 Base) MCG/ACT inhaler INHALE 2 PUFFS BY MOUTH 4 TIMES DAILY AS NEEDED 9 each 2  . traZODone (DESYREL) 50 MG tablet Take 0.5-1 tablets (25-50 mg total) by mouth at bedtime as needed for sleep. 30 tablet 3  . hydrALAZINE (APRESOLINE) 25 MG tablet Take 1 tablet (25 mg total) by mouth daily as needed. 90 tablet 3   No facility-administered medications prior to visit.     PAST MEDICAL HISTORY: Past Medical History:  Diagnosis Date  . Arthritis    "wrists, knees, back" (12/10/2015)  . Asthma   . Cerebral atherosclerosis   . Chronic back pain    "lower primarily" (12/10/2015)  . CORONARY ATHEROSCLEROSIS NATIVE CORONARY ARTERY    Myocardial infarction at [facility] in 1996 with a directional atherectomy of the 90% LAD stenosis and medical management of 60-70% proximal circumflex and 50% RCA stenosis.  . DYSLIPIDEMIA   . GERD (gastroesophageal reflux disease)   . History of hiatal hernia   . HYPERTENSION   . ILIAC ARTERY ANEURYSM   . Myocardial infarction ([place]) 1996  . Stroke [facility]) 2003; 2013; 07/2015; 12/05/2015   denies residual on 12/10/2015, 12/13/16, 01/08/17  . TIA (transient ischemic attack)   . Type II diabetes mellitus ([place])   . UPPER RESPIRATORY INFECTION     PAST SURGICAL HISTORY: Past Surgical History:  Procedure Laterality Date  . ANAL FISSURE REPAIR  1990s  . BASAL CELL CARCINOMA EXCISION Right 12/2015   "temple"  . CATARACT EXTRACTION W/ INTRAOCULAR LENS  IMPLANT, BILATERAL Bilateral 2000s  . CORONARY ANGIOPLASTY  1996  . KNEE CARTILAGE SURGERY Right 1970s  . LOOP RECORDER INSERTION N/A  12/15/2016   Procedure: Loop Recorder Insertion;  Surgeon: Thompson Grayer, MD;  Location: [facility];  Service: Cardiovascular;  Laterality: N/A;  . SHOULDER ARTHROSCOPY W/ ROTATOR CUFF REPAIR Left 2000s  . TEE WITHOUT CARDIOVERSION N/A 12/15/2016   Procedure: TRANSESOPHAGEAL ECHOCARDIOGRAM (TEE);  Surgeon: Lelon Perla, MD;  Location: [facility] ENDOSCOPY;  Service: Cardiovascular;  Laterality: N/A;    FAMILY HISTORY: Family History  Problem Relation Age of Onset  . Coronary artery disease Mother   . Cancer Father        lung  . Stroke Father   . Cancer Brother        lung  . Stroke Sister     SOCIAL HISTORY: Social History   Social History  . Marital status: Married    Spouse name: N/A  . Number of children: N/A  . Years of education: N/A   Occupational History  . [employer]     retired    Science writer  History Main Topics  . Smoking status: Former Smoker    Packs/day: 0.12    Years: 45.00    Types: Cigarettes    Quit date: 11/24/1984  . Smokeless tobacco: Former Systems developer    Types: Snuff     Comment: "dipped snuff when I was a boy"  . Alcohol use No  . Drug use: No  . Sexual activity: No   Other Topics Concern  . Not on file   Social History Narrative  . No narrative on file     PHYSICAL EXAM  Vitals:   02/10/17 0931  BP: (!) 151/81  Pulse: 68  Weight: 192 lb 12.8 oz (87.5 kg)   Body mass index is 26.89 kg/m.  Generalized: Well developed, in no acute distress  Head: normocephalic and atraumatic,. Oropharynx benign  Neck: Supple, no carotid bruits  Cardiac: Regular rate rhythm, no murmur  Musculoskeletal: No deformity   Neurological examination   Mentation: Alert oriented to time, place, history taking. Attention span and concentration appropriate. Recent and remote memory intact.  Follows all commands speech and language fluent.   Cranial nerve II-XII: Pupils were equal round reactive to light extraocular movements were full, visual field were full  on confrontational test.  mild left lower facial asymmetry, hearing was intact to finger rubbing bilaterally. Uvula tongue midline. head turning and shoulder shrug were normal and symmetric.Tongue protrusion into cheek strength was normal. Motor: normal bulk and tone, full strength in the BUE, BLE, fine finger movements normal, no pronator drift. No focal weakness Sensory: normal and symmetric to light touch,  Coordination: finger-nose-finger, heel-to-shin bilaterally, no dysmetria Reflexes:  symmetric upper and lowerplantar responses were flexor bilaterally. Gait and Station: Rising up from seated position without assistance, normal stance,  moderate stride, good arm swing, smooth turning, able to perform tiptoe, and heel walking without difficulty. patient has a limp on the right due to knee pain   DIAGNOSTIC DATA (LABS, IMAGING, TESTING) - I reviewed patient records, labs, notes, testing and imaging myself where available.  Lab Results  Component Value Date   WBC 9.7 01/08/2017   HGB 14.6 01/08/2017   HCT 43.0 01/08/2017   MCV 92.9 01/08/2017   PLT 192 01/08/2017      Component Value Date/Time   NA 131 (L) 01/15/2017 0944   K 4.1 01/15/2017 0944   CL 91 (L) 01/15/2017 0944   CO2 23 01/15/2017 0944   GLUCOSE 150 (H) 01/15/2017 0944   GLUCOSE 127 (H) 01/08/2017 1631   BUN 14 01/15/2017 0944   CREATININE 1.23 01/15/2017 0944   CREATININE 1.27 02/02/2013 0853   CALCIUM 9.9 01/15/2017 0944   PROT 7.0 01/08/2017 1619   PROT 7.0 11/05/2016 0845   ALBUMIN 4.3 01/08/2017 1619   ALBUMIN 4.6 11/05/2016 0845   AST 26 01/08/2017 1619   ALT 19 01/08/2017 1619   ALKPHOS 67 01/08/2017 1619   BILITOT 1.3 (H) 01/08/2017 1619   BILITOT 0.6 11/05/2016 0845   GFRNONAA 54 (L) 01/15/2017 0944   GFRNONAA 53 (L) 02/02/2013 0853   GFRAA 62 01/15/2017 0944   GFRAA 61 02/02/2013 0853   Lab Results  Component Value Date   CHOL 85 12/14/2016   HDL 30 (L) 12/14/2016   LDLCALC 27 12/14/2016    TRIG 139 12/14/2016   CHOLHDL 2.8 12/14/2016   Lab Results  Component Value Date   HGBA1C 6.3 (H) 12/14/2016    ASSESSMENT AND PLAN  83 y.o. year old male  has a past medical history  of Arthritis;  Cerebral atherosclerosis; CORONARY ATHEROSCLEROSIS NATIVE CORONARY ARTERY; DYSLIPIDEMIA;  HYPERTENSION; ILIAC ARTERY ANEURYSM; Myocardial infarction ([place]) (1996); Stroke [facility]) (2003; 2013; 07/2015; 12/05/2015); TIA (transient ischemic attack); Type II diabetes mellitus ([place]);  Here for hospital follow-up for right MCA infarcts in May 2018.The patient is a current patient of Dr. Leonie Man   who is out of the office today . This note is sent to the work in doctor.     Stressed the importance of management of risk factors to prevent further stroke Continue Plavix and Aspirin for secondary stroke prevention until next visit per Dr. Erlinda Hong Maintain strict control of hypertension with blood pressure goal below 130/90, today's reading 151/81 continue antihypertensive medications Control of diabetes with hemoglobin A1c below 6.5 followed by primary care most recent hemoglobin A1c 6.3 continue diabetic medications Cholesterol with LDL cholesterol less than 70, followed by primary care,  most recent49  continue Lipitor Exercise by walking, slowly increase , eat healthy diet with whole grains,  fresh fruits and vegetables Loop recorder so far without arrhythmias Follow up in 3 monrhs Discussed risk for recurrent stroke/ TIA and answered additional questions This was a prolonged visit requiring 30 minutes and medical decision making of high complexity with extensive review of history, hospital chart, counseling and answering questions Dennie Bible, [facility], [facility], APRN  [facility] [address] [place], [place] [address] [phone]

## 2017-02-10 NOTE — Patient Instructions (Signed)
Stressed the importance of management of risk factors to prevent further stroke Continue Plavix and Aspirin for secondary stroke prevention  Maintain strict control of hypertension with blood pressure goal below 130/90, today's reading 151/81 continue antihypertensive medications Control of diabetes with hemoglobin A1c below 6.5 followed by primary care most recent hemoglobin A1c 6.3 continue diabetic medications Cholesterol with LDL cholesterol less than 70, followed by primary care,  most recent49  continue Lipitor Exercise by walking, slowly increase , eat healthy diet with whole grains,  fresh fruits and vegetables Loop recorder so far without arrhythmias Follow up in 3 monrhs

## 2017-02-11 NOTE — Progress Notes (Signed)
I agree with the assessment and plan as directed by NP .The patient is known to me .   Brihany Butch, MD  

## 2017-02-15 ENCOUNTER — Ambulatory Visit (INDEPENDENT_AMBULATORY_CARE_PROVIDER_SITE_OTHER): Payer: Medicare Other | Admitting: *Deleted

## 2017-02-15 DIAGNOSIS — G459 Transient cerebral ischemic attack, unspecified: Secondary | ICD-10-CM | POA: Diagnosis not present

## 2017-02-15 NOTE — Progress Notes (Signed)
Carelink Summary Report / Loop Recorder 

## 2017-02-16 DIAGNOSIS — J45909 Unspecified asthma, uncomplicated: Secondary | ICD-10-CM | POA: Diagnosis not present

## 2017-02-16 DIAGNOSIS — J342 Deviated nasal septum: Secondary | ICD-10-CM | POA: Diagnosis not present

## 2017-02-16 DIAGNOSIS — Z7982 Long term (current) use of aspirin: Secondary | ICD-10-CM | POA: Diagnosis not present

## 2017-02-16 DIAGNOSIS — Z8673 Personal history of transient ischemic attack (TIA), and cerebral infarction without residual deficits: Secondary | ICD-10-CM | POA: Diagnosis not present

## 2017-02-16 DIAGNOSIS — Z7902 Long term (current) use of antithrombotics/antiplatelets: Secondary | ICD-10-CM | POA: Diagnosis not present

## 2017-02-16 DIAGNOSIS — J349 Unspecified disorder of nose and nasal sinuses: Secondary | ICD-10-CM | POA: Diagnosis not present

## 2017-02-16 DIAGNOSIS — H6123 Impacted cerumen, bilateral: Secondary | ICD-10-CM | POA: Diagnosis not present

## 2017-02-16 DIAGNOSIS — J329 Chronic sinusitis, unspecified: Secondary | ICD-10-CM | POA: Diagnosis not present

## 2017-02-16 DIAGNOSIS — Z87891 Personal history of nicotine dependence: Secondary | ICD-10-CM | POA: Diagnosis not present

## 2017-02-16 DIAGNOSIS — E119 Type 2 diabetes mellitus without complications: Secondary | ICD-10-CM | POA: Diagnosis not present

## 2017-02-16 DIAGNOSIS — Z7984 Long term (current) use of oral hypoglycemic drugs: Secondary | ICD-10-CM | POA: Diagnosis not present

## 2017-02-16 DIAGNOSIS — J343 Hypertrophy of nasal turbinates: Secondary | ICD-10-CM | POA: Diagnosis not present

## 2017-02-16 DIAGNOSIS — Z79899 Other long term (current) drug therapy: Secondary | ICD-10-CM | POA: Diagnosis not present

## 2017-02-16 DIAGNOSIS — I2510 Atherosclerotic heart disease of native coronary artery without angina pectoris: Secondary | ICD-10-CM | POA: Diagnosis not present

## 2017-02-16 DIAGNOSIS — I10 Essential (primary) hypertension: Secondary | ICD-10-CM | POA: Diagnosis not present

## 2017-02-16 DIAGNOSIS — J320 Chronic maxillary sinusitis: Secondary | ICD-10-CM | POA: Diagnosis not present

## 2017-02-16 DIAGNOSIS — E781 Pure hyperglyceridemia: Secondary | ICD-10-CM | POA: Diagnosis not present

## 2017-02-16 DIAGNOSIS — Z7951 Long term (current) use of inhaled steroids: Secondary | ICD-10-CM | POA: Diagnosis not present

## 2017-02-20 LAB — CUP PACEART REMOTE DEVICE CHECK
Implantable Pulse Generator Implant Date: 20180515
MDC IDC SESS DTM: 20180714221102

## 2017-02-20 NOTE — Progress Notes (Signed)
Carelink summary report received. Battery status OK. Normal device function. No new symptom episodes, tachy episodes, brady, or pause episodes. No new AF episodes. Monthly summary reports and ROV/PRN 

## 2017-02-22 ENCOUNTER — Inpatient Hospital Stay: Admit: 2017-02-22 | Payer: Medicare Other | Admitting: Orthopedic Surgery

## 2017-02-22 DIAGNOSIS — C44319 Basal cell carcinoma of skin of other parts of face: Secondary | ICD-10-CM | POA: Diagnosis not present

## 2017-02-22 SURGERY — ARTHROPLASTY, KNEE, TOTAL
Anesthesia: Choice | Site: Knee | Laterality: Right

## 2017-02-26 ENCOUNTER — Telehealth: Payer: Self-pay | Admitting: Cardiology

## 2017-02-26 NOTE — Telephone Encounter (Signed)
Spoke w/ pt wife and requested that he send a manual transmission b/c his home monitor has not updated in at least 14 days.   

## 2017-03-01 ENCOUNTER — Other Ambulatory Visit: Payer: Self-pay | Admitting: Family Medicine

## 2017-03-02 ENCOUNTER — Ambulatory Visit (INDEPENDENT_AMBULATORY_CARE_PROVIDER_SITE_OTHER): Payer: Medicare Other | Admitting: Family Medicine

## 2017-03-02 DIAGNOSIS — M6281 Muscle weakness (generalized): Secondary | ICD-10-CM

## 2017-03-02 DIAGNOSIS — I2510 Atherosclerotic heart disease of native coronary artery without angina pectoris: Secondary | ICD-10-CM | POA: Diagnosis not present

## 2017-03-02 DIAGNOSIS — I129 Hypertensive chronic kidney disease with stage 1 through stage 4 chronic kidney disease, or unspecified chronic kidney disease: Secondary | ICD-10-CM | POA: Diagnosis not present

## 2017-03-02 DIAGNOSIS — Z8673 Personal history of transient ischemic attack (TIA), and cerebral infarction without residual deficits: Secondary | ICD-10-CM | POA: Diagnosis not present

## 2017-03-02 DIAGNOSIS — J4530 Mild persistent asthma, uncomplicated: Secondary | ICD-10-CM | POA: Diagnosis not present

## 2017-03-02 DIAGNOSIS — E1122 Type 2 diabetes mellitus with diabetic chronic kidney disease: Secondary | ICD-10-CM | POA: Diagnosis not present

## 2017-03-02 DIAGNOSIS — R2681 Unsteadiness on feet: Secondary | ICD-10-CM

## 2017-03-02 DIAGNOSIS — N183 Chronic kidney disease, stage 3 (moderate): Secondary | ICD-10-CM

## 2017-03-06 DIAGNOSIS — H2513 Age-related nuclear cataract, bilateral: Secondary | ICD-10-CM | POA: Diagnosis not present

## 2017-03-06 DIAGNOSIS — H40033 Anatomical narrow angle, bilateral: Secondary | ICD-10-CM | POA: Diagnosis not present

## 2017-03-06 LAB — HM DIABETES EYE EXAM

## 2017-03-11 ENCOUNTER — Telehealth: Payer: Self-pay | Admitting: Cardiology

## 2017-03-11 NOTE — Telephone Encounter (Signed)
LMOVM requesting that pt send manual transmission b/c home monitor has not updated in at least 7 days.    

## 2017-03-15 ENCOUNTER — Telehealth: Payer: Self-pay | Admitting: Internal Medicine

## 2017-03-15 ENCOUNTER — Ambulatory Visit (INDEPENDENT_AMBULATORY_CARE_PROVIDER_SITE_OTHER): Payer: Medicare Other | Admitting: *Deleted

## 2017-03-15 DIAGNOSIS — I639 Cerebral infarction, unspecified: Secondary | ICD-10-CM

## 2017-03-16 NOTE — Telephone Encounter (Signed)
Informed wife that manual transmission was not needed since one came through at 90. Wife verbalized understanding.

## 2017-03-16 NOTE — Telephone Encounter (Signed)
New message    Pt wife is calling about pt transmission.

## 2017-03-20 LAB — CUP PACEART REMOTE DEVICE CHECK
Date Time Interrogation Session: 20180813223820
Implantable Pulse Generator Implant Date: 20180515

## 2017-03-20 NOTE — Progress Notes (Signed)
Carelink summary report received. Battery status OK. Normal device function. No new symptom episodes, tachy episodes, brady, or pause episodes. No new AF episodes. Monthly summary reports and ROV/PRN 

## 2017-03-22 NOTE — Progress Notes (Signed)
HPI The patient presents for followup of his known coronary disease.  He was hospitalized earlier this year with a TIA.  I reviewed these records for this visit.    He had a loop recorder placed.  He did have a TEE demonstrating a small PFO. Doppler was negative for DVT.  MRI demonstrated small acute infarcts in the MCA distribution. He had an implanted loop monitor.  I reviewed the device interrogation from earlier this month and he has had no significant arrhythmias.  He returns for follow up.  He was in the ED in June for possible TIA.  I reviewed these records for this visit.  He had no acute changes on head CT and had no med changes in his meds.  He has had no symptoms since then.  His loop implant does not show any atrial fib.  He does at times at night feel like his heart is racing and he is SOB.  However, previously this has not correlated with any arrhythmias on tele.  He is limited by knee pain.  The patient denies any new symptoms such as chest discomfort, neck or arm discomfort. There has been PND or orthopnea. There have been no reported presyncope or syncope.   Allergies  Allergen Reactions  . Codeine Other (See Comments)    Bad dreams  . Lopid [Gemfibrozil] Nausea Only  . Voltaren [Diclofenac Sodium] Diarrhea    Current Outpatient Prescriptions  Medication Sig Dispense Refill  . aspirin EC 81 MG tablet Take 1 tablet (81 mg total) by mouth daily. 150 tablet 2  . atorvastatin (LIPITOR) 40 MG tablet Take 1 tablet (40 mg total) by mouth daily. (Patient taking differently: Take 40 mg by mouth every evening. ) 90 tablet 0  . benazepril (LOTENSIN) 20 MG tablet TAKE 1 TABLET BY MOUTH ONCE DAILY 90 tablet 1  . Cholecalciferol (VITAMIN D) 2000 units CAPS Take 2,000 Units by mouth daily.    . clopidogrel (PLAVIX) 75 MG tablet TAKE ONE TABLET BY MOUTH ONCE DAILY 90 tablet 3  . feeding supplement, GLUCERNA SHAKE, (GLUCERNA SHAKE) LIQD Take 237 mLs by mouth 2 (two) times daily between meals.  60 Can 11  . fish oil-omega-3 fatty acids 1000 MG capsule Take 2 g by mouth daily.      . Fluticasone-Salmeterol (ADVAIR DISKUS) 100-50 MCG/DOSE AEPB INHALE ONE DOSE BY MOUTH EVERY 12 HOURS 180 each 2  . hydrALAZINE (APRESOLINE) 25 MG tablet Take 1 tablet (25 mg total) by mouth daily as needed. 90 tablet 3  . hydrochlorothiazide (HYDRODIURIL) 25 MG tablet TAKE ONE-HALF TABLET BY MOUTH ONCE DAILY EXCEPT  ON  MONDAY,  WEDNESDAY,  AND  FRIDAY  TAKE  A  WHOLE  TABLET 90 tablet 0  . metFORMIN (GLUCOPHAGE-XR) 500 MG 24 hr tablet TAKE ONE TABLET BY MOUTH TWICE DAILY 180 tablet 0  . metoprolol tartrate (LOPRESSOR) 100 MG tablet TAKE ONE TABLET BY MOUTH TWICE DAILY 180 tablet 0  . nitroGLYCERIN (NITROSTAT) 0.4 MG SL tablet Place 0.4 mg under the tongue every 5 (five) minutes as needed for chest pain.    . pantoprazole (PROTONIX) 40 MG tablet Take 1 tablet (40 mg total) by mouth daily. 30 tablet 11  . PROAIR HFA 108 (90 Base) MCG/ACT inhaler INHALE 2 PUFFS BY MOUTH 4 TIMES DAILY AS NEEDED 9 each 2  . BAYER CONTOUR TEST test strip USE ONE STRIP TO CHECK GLUCOSE TWICE DAILY AND  AS  NEEDED 100 each 11  . traZODone (DESYREL)  50 MG tablet Take 0.5-1 tablets (25-50 mg total) by mouth at bedtime as needed for sleep. (Patient not taking: Reported on 03/24/2017) 30 tablet 3   No current facility-administered medications for this visit.     Past Medical History:  Diagnosis Date  . Arthritis    "wrists, knees, back" (12/10/2015)  . Asthma   . Cerebral atherosclerosis   . Chronic back pain    "lower primarily" (12/10/2015)  . CORONARY ATHEROSCLEROSIS NATIVE CORONARY ARTERY    Myocardial infarction at [facility] in 1996 with a directional atherectomy of the 90% LAD stenosis and medical management of 60-70% proximal circumflex and 50% RCA stenosis.  . DYSLIPIDEMIA   . GERD (gastroesophageal reflux disease)   . History of hiatal hernia   . HYPERTENSION   . ILIAC ARTERY ANEURYSM   . Myocardial infarction ([place])  1996  . Stroke [facility]) 2003; 2013; 07/2015; 12/05/2015   denies residual on 12/10/2015, 12/13/16, 01/08/17  . TIA (transient ischemic attack)   . Type II diabetes mellitus ([place])   . UPPER RESPIRATORY INFECTION     Past Surgical History:  Procedure Laterality Date  . ANAL FISSURE REPAIR  1990s  . BASAL CELL CARCINOMA EXCISION Right 12/2015   "temple"  . CATARACT EXTRACTION W/ INTRAOCULAR LENS  IMPLANT, BILATERAL Bilateral 2000s  . CORONARY ANGIOPLASTY  1996  . KNEE CARTILAGE SURGERY Right 1970s  . LOOP RECORDER INSERTION N/A 12/15/2016   Procedure: Loop Recorder Insertion;  Surgeon: Thompson Grayer, MD;  Location: [place] CV LAB;  Service: Cardiovascular;  Laterality: N/A;  . SHOULDER ARTHROSCOPY W/ ROTATOR CUFF REPAIR Left 2000s  . TEE WITHOUT CARDIOVERSION N/A 12/15/2016   Procedure: TRANSESOPHAGEAL ECHOCARDIOGRAM (TEE);  Surgeon: Lelon Perla, MD;  Location: [facility] ENDOSCOPY;  Service: Cardiovascular;  Laterality: N/A;    ROS:   As stated in the HPI and negative for all other systems.  PHYSICAL EXAM BP 132/90   Pulse 63   Ht 5\' 11"  (1.803 m)   Wt 189 lb (85.7 kg)   BMI 26.36 kg/m   GENERAL:  Well appearing NECK:  No jugular venous distention, waveform within normal limits, carotid upstroke brisk and symmetric, no bruits, no thyromegaly LUNGS:  Clear to auscultation bilaterally CHEST:  Unremarkable HEART:  PMI not displaced or sustained,S1 and S2 within normal limits, no S3, no S4, no clicks, no rubs, 2/6 apical systolic murmur without diastolic murmurs ABD:  Flat, positive bowel sounds normal in frequency in pitch, no bruits, no rebound, no guarding, no midline pulsatile mass, no hepatomegaly, no splenomegaly EXT:  2 plus pulses throughout, trace edema, no cyanosis no clubbing    Lab Results  Component Value Date   HGBA1C 6.3 (H) 12/14/2016   Lab Results  Component Value Date   CHOL 85 12/14/2016   TRIG 139 12/14/2016   HDL 30 (L) 12/14/2016   LDLCALC 27 12/14/2016      ASSESSMENT AND PLAN  TIA:  He is on aspirin and Plavix. He has his loop monitor. I reviewed this today.  He has had no atrial fib.  We will continue to monitor this.  No change in therapy at this time.   PFO:  This is an incidental finding. No change in therapy is planned   CAD:  The patient has no new sypmtoms.  No further cardiovascular testing is indicated.  We will continue with aggressive risk reduction and meds as listed.  He had stress testing in 2012.   HTN:   The blood pressure  is at target. He will continue meds as listed.   HYPERLIPIDEMIA:   Labs as above.  He will continue current meds.  CAROTID STENOSIS:  This is mild and no further imaging is planed.  DM:   His A1C was 6.3.  Continue current meds.

## 2017-03-24 ENCOUNTER — Ambulatory Visit (INDEPENDENT_AMBULATORY_CARE_PROVIDER_SITE_OTHER): Payer: Medicare Other | Admitting: Cardiology

## 2017-03-24 ENCOUNTER — Encounter: Payer: Self-pay | Admitting: Cardiology

## 2017-03-24 VITALS — BP 132/90 | HR 63 | Ht 71.0 in | Wt 189.0 lb

## 2017-03-24 DIAGNOSIS — G459 Transient cerebral ischemic attack, unspecified: Secondary | ICD-10-CM | POA: Diagnosis not present

## 2017-03-24 DIAGNOSIS — E785 Hyperlipidemia, unspecified: Secondary | ICD-10-CM | POA: Diagnosis not present

## 2017-03-24 DIAGNOSIS — I2510 Atherosclerotic heart disease of native coronary artery without angina pectoris: Secondary | ICD-10-CM | POA: Diagnosis not present

## 2017-03-24 DIAGNOSIS — I10 Essential (primary) hypertension: Secondary | ICD-10-CM

## 2017-03-24 NOTE — Patient Instructions (Signed)
Medication Instructions:  The current medical regimen is effective;  continue present plan and medications.  Follow-Up: Follow up in 6 months with Dr. Hochrein in [place].  You will receive a letter in the mail 2 months before you are due.  Please call us when you receive this letter to schedule your follow up appointment.  If you need a refill on your cardiac medications before your next appointment, please call your pharmacy.  Thank you for choosing [facility]!!     

## 2017-03-26 ENCOUNTER — Other Ambulatory Visit: Payer: Self-pay | Admitting: Family Medicine

## 2017-03-29 NOTE — Progress Notes (Signed)
Loop recorder summary report 

## 2017-04-01 ENCOUNTER — Other Ambulatory Visit: Payer: Self-pay | Admitting: Dermatology

## 2017-04-01 DIAGNOSIS — C44319 Basal cell carcinoma of skin of other parts of face: Secondary | ICD-10-CM | POA: Diagnosis not present

## 2017-04-01 DIAGNOSIS — C44519 Basal cell carcinoma of skin of other part of trunk: Secondary | ICD-10-CM | POA: Diagnosis not present

## 2017-04-12 ENCOUNTER — Encounter: Payer: Self-pay | Admitting: Family Medicine

## 2017-04-12 ENCOUNTER — Ambulatory Visit (INDEPENDENT_AMBULATORY_CARE_PROVIDER_SITE_OTHER): Payer: Medicare Other | Admitting: Family Medicine

## 2017-04-12 VITALS — BP 142/78 | HR 59 | Temp 96.6°F | Ht 71.0 in | Wt 189.0 lb

## 2017-04-12 DIAGNOSIS — I10 Essential (primary) hypertension: Secondary | ICD-10-CM

## 2017-04-12 DIAGNOSIS — M545 Low back pain, unspecified: Secondary | ICD-10-CM

## 2017-04-12 DIAGNOSIS — E1159 Type 2 diabetes mellitus with other circulatory complications: Secondary | ICD-10-CM | POA: Diagnosis not present

## 2017-04-12 LAB — BAYER DCA HB A1C WAIVED: HB A1C (BAYER DCA - WAIVED): 5.9 % (ref <7.0)

## 2017-04-12 NOTE — Progress Notes (Signed)
HPI  Patient presents today for follow-up for chronic medical conditions and back pain.  Patient had flare of right-sided low back pain without sciatica about one week ago, he began improving about 2 days ago. No injury. Was using 650 mg of Tylenol with good effect, he was using this once daily.  Type 2 diabetes Average fasting blood sugar 100-130 Watching diet Good medication compliance.  Blood pressure at home has been 120s over 60s. No headache or chest pain.  PMH: Smoking status noted ROS: Per HPI  Objective: BP (!) 142/78   Pulse (!) 59   Temp (!) 96.6 F (35.9 C) (Oral)   Ht 5' 11" (1.803 m)   Wt 189 lb (85.7 kg)   BMI 26.36 kg/m  Gen: NAD, alert, cooperative with exam HEENT: NCAT CV: RRR, good S1/S2, no murmur Resp: CTABL, no wheezes, non-labored Ext: No edema, warm Neuro: Alert and oriented, No gross deficits  Diabetic Foot Exam - Simple   Simple Foot Form Diabetic Foot exam was performed with the following findings:  Yes 04/12/2017  9:33 AM  Visual Inspection No deformities, no ulcerations, no other skin breakdown bilaterally:  Yes Sensation Testing Intact to touch and monofilament testing bilaterally:  Yes Pulse Check Posterior Tibialis and Dorsalis pulse intact bilaterally:  Yes Comments Long thickened toenails throughout      Assessment and plan:  # Right-sided low back pain Resolving No red flags No changes, reviewed appropriate Tylenol dosing, may take up to 3 times a day  # Type 2 diabetes Well controlled with A1c of 5.9. No changes GFR monitoring every 3-6 months   # Hypertension Well-controlled at home, slightly elevated here. No changes   Orders Placed This Encounter  Procedures  . Bayer DCA Hb A1c Waived  . Ravenel, MD [facility] 04/12/2017, 9:38 AM

## 2017-04-12 NOTE — Patient Instructions (Signed)
Great to see you!  Come back in 3 months unless you need us sooner.    

## 2017-04-13 DIAGNOSIS — L84 Corns and callosities: Secondary | ICD-10-CM | POA: Diagnosis not present

## 2017-04-13 DIAGNOSIS — B351 Tinea unguium: Secondary | ICD-10-CM | POA: Diagnosis not present

## 2017-04-13 DIAGNOSIS — E119 Type 2 diabetes mellitus without complications: Secondary | ICD-10-CM | POA: Diagnosis not present

## 2017-04-13 LAB — BMP8+EGFR
BUN/Creatinine Ratio: 8 — ABNORMAL LOW (ref 10–24)
BUN: 9 mg/dL (ref 8–27)
CO2: 26 mmol/L (ref 20–29)
Calcium: 9.3 mg/dL (ref 8.6–10.2)
Chloride: 94 mmol/L — ABNORMAL LOW (ref 96–106)
Creatinine, Ser: 1.17 mg/dL (ref 0.76–1.27)
GFR, EST AFRICAN AMERICAN: 66 mL/min/{1.73_m2} (ref >59)
GFR, EST NON AFRICAN AMERICAN: 57 mL/min/{1.73_m2} — AB (ref >59)
Glucose: 132 mg/dL — ABNORMAL HIGH (ref 65–99)
Potassium: 4.5 mmol/L (ref 3.5–5.2)
SODIUM: 136 mmol/L (ref 134–144)

## 2017-04-14 ENCOUNTER — Ambulatory Visit (INDEPENDENT_AMBULATORY_CARE_PROVIDER_SITE_OTHER): Payer: Medicare Other | Admitting: *Deleted

## 2017-04-14 DIAGNOSIS — I639 Cerebral infarction, unspecified: Secondary | ICD-10-CM

## 2017-04-15 NOTE — Progress Notes (Signed)
Carelink Summary Report / Loop Recorder 

## 2017-04-19 LAB — CUP PACEART REMOTE DEVICE CHECK
Date Time Interrogation Session: 20180912224104
Implantable Pulse Generator Implant Date: 20180515

## 2017-04-21 ENCOUNTER — Inpatient Hospital Stay (HOSPITAL_COMMUNITY)
Admission: EM | Admit: 2017-04-21 | Discharge: 2017-04-22 | DRG: 061 | Disposition: A | Discharge status: Home / Self Care | Payer: Medicare Other | Attending: Neurology | Admitting: Neurology

## 2017-04-21 ENCOUNTER — Emergency Department (HOSPITAL_COMMUNITY): Payer: Medicare Other

## 2017-04-21 ENCOUNTER — Inpatient Hospital Stay (HOSPITAL_COMMUNITY): Payer: Medicare Other

## 2017-04-21 ENCOUNTER — Encounter (HOSPITAL_COMMUNITY): Payer: Self-pay | Admitting: *Deleted

## 2017-04-21 DIAGNOSIS — I252 Old myocardial infarction: Secondary | ICD-10-CM

## 2017-04-21 DIAGNOSIS — N183 Chronic kidney disease, stage 3 (moderate): Secondary | ICD-10-CM | POA: Diagnosis present

## 2017-04-21 DIAGNOSIS — E1122 Type 2 diabetes mellitus with diabetic chronic kidney disease: Secondary | ICD-10-CM | POA: Diagnosis present

## 2017-04-21 DIAGNOSIS — I2510 Atherosclerotic heart disease of native coronary artery without angina pectoris: Secondary | ICD-10-CM | POA: Diagnosis not present

## 2017-04-21 DIAGNOSIS — I723 Aneurysm of iliac artery: Secondary | ICD-10-CM | POA: Diagnosis present

## 2017-04-21 DIAGNOSIS — Z7984 Long term (current) use of oral hypoglycemic drugs: Secondary | ICD-10-CM

## 2017-04-21 DIAGNOSIS — T8089XA Other complications following infusion, transfusion and therapeutic injection, initial encounter: Secondary | ICD-10-CM | POA: Diagnosis not present

## 2017-04-21 DIAGNOSIS — Y848 Other medical procedures as the cause of abnormal reaction of the patient, or of later complication, without mention of misadventure at the time of the procedure: Secondary | ICD-10-CM | POA: Diagnosis not present

## 2017-04-21 DIAGNOSIS — E1151 Type 2 diabetes mellitus with diabetic peripheral angiopathy without gangrene: Secondary | ICD-10-CM | POA: Diagnosis present

## 2017-04-21 DIAGNOSIS — I10 Essential (primary) hypertension: Secondary | ICD-10-CM | POA: Diagnosis not present

## 2017-04-21 DIAGNOSIS — Z87891 Personal history of nicotine dependence: Secondary | ICD-10-CM

## 2017-04-21 DIAGNOSIS — Y92239 Unspecified place in hospital as the place of occurrence of the external cause: Secondary | ICD-10-CM | POA: Diagnosis present

## 2017-04-21 DIAGNOSIS — E785 Hyperlipidemia, unspecified: Secondary | ICD-10-CM | POA: Diagnosis present

## 2017-04-21 DIAGNOSIS — G459 Transient cerebral ischemic attack, unspecified: Secondary | ICD-10-CM | POA: Diagnosis present

## 2017-04-21 DIAGNOSIS — J45909 Unspecified asthma, uncomplicated: Secondary | ICD-10-CM | POA: Diagnosis present

## 2017-04-21 DIAGNOSIS — M549 Dorsalgia, unspecified: Secondary | ICD-10-CM | POA: Diagnosis not present

## 2017-04-21 DIAGNOSIS — K219 Gastro-esophageal reflux disease without esophagitis: Secondary | ICD-10-CM | POA: Diagnosis present

## 2017-04-21 DIAGNOSIS — R531 Weakness: Secondary | ICD-10-CM | POA: Diagnosis not present

## 2017-04-21 DIAGNOSIS — R29702 NIHSS score 2: Secondary | ICD-10-CM | POA: Diagnosis present

## 2017-04-21 DIAGNOSIS — Z888 Allergy status to other drugs, medicaments and biological substances status: Secondary | ICD-10-CM

## 2017-04-21 DIAGNOSIS — Z9861 Coronary angioplasty status: Secondary | ICD-10-CM

## 2017-04-21 DIAGNOSIS — I129 Hypertensive chronic kidney disease with stage 1 through stage 4 chronic kidney disease, or unspecified chronic kidney disease: Secondary | ICD-10-CM | POA: Diagnosis present

## 2017-04-21 DIAGNOSIS — R4701 Aphasia: Secondary | ICD-10-CM | POA: Diagnosis not present

## 2017-04-21 DIAGNOSIS — I6300 Cerebral infarction due to thrombosis of unspecified precerebral artery: Secondary | ICD-10-CM | POA: Diagnosis not present

## 2017-04-21 DIAGNOSIS — I639 Cerebral infarction, unspecified: Principal | ICD-10-CM | POA: Diagnosis present

## 2017-04-21 DIAGNOSIS — G9340 Encephalopathy, unspecified: Secondary | ICD-10-CM | POA: Diagnosis not present

## 2017-04-21 DIAGNOSIS — R29818 Other symptoms and signs involving the nervous system: Secondary | ICD-10-CM | POA: Diagnosis not present

## 2017-04-21 DIAGNOSIS — Z8673 Personal history of transient ischemic attack (TIA), and cerebral infarction without residual deficits: Secondary | ICD-10-CM | POA: Diagnosis present

## 2017-04-21 DIAGNOSIS — I672 Cerebral atherosclerosis: Secondary | ICD-10-CM | POA: Diagnosis not present

## 2017-04-21 DIAGNOSIS — Z79899 Other long term (current) drug therapy: Secondary | ICD-10-CM

## 2017-04-21 DIAGNOSIS — G8929 Other chronic pain: Secondary | ICD-10-CM | POA: Diagnosis not present

## 2017-04-21 DIAGNOSIS — M19039 Primary osteoarthritis, unspecified wrist: Secondary | ICD-10-CM | POA: Diagnosis present

## 2017-04-21 DIAGNOSIS — R4781 Slurred speech: Secondary | ICD-10-CM | POA: Diagnosis not present

## 2017-04-21 DIAGNOSIS — Z885 Allergy status to narcotic agent status: Secondary | ICD-10-CM

## 2017-04-21 DIAGNOSIS — Z85828 Personal history of other malignant neoplasm of skin: Secondary | ICD-10-CM

## 2017-04-21 DIAGNOSIS — G8191 Hemiplegia, unspecified affecting right dominant side: Secondary | ICD-10-CM | POA: Diagnosis present

## 2017-04-21 DIAGNOSIS — H547 Unspecified visual loss: Secondary | ICD-10-CM | POA: Diagnosis not present

## 2017-04-21 DIAGNOSIS — M1710 Unilateral primary osteoarthritis, unspecified knee: Secondary | ICD-10-CM | POA: Diagnosis not present

## 2017-04-21 DIAGNOSIS — T783XXA Angioneurotic edema, initial encounter: Secondary | ICD-10-CM | POA: Diagnosis not present

## 2017-04-21 DIAGNOSIS — I638 Other cerebral infarction: Secondary | ICD-10-CM | POA: Diagnosis not present

## 2017-04-21 LAB — COMPREHENSIVE METABOLIC PANEL
ALK PHOS: 66 U/L (ref 38–126)
ALT: 18 U/L (ref 17–63)
AST: 23 U/L (ref 15–41)
Albumin: 4.3 g/dL (ref 3.5–5.0)
Anion gap: 13 (ref 5–15)
BILIRUBIN TOTAL: 1.3 mg/dL — AB (ref 0.3–1.2)
BUN: 11 mg/dL (ref 6–20)
CALCIUM: 9.2 mg/dL (ref 8.9–10.3)
CHLORIDE: 94 mmol/L — AB (ref 101–111)
CO2: 24 mmol/L (ref 22–32)
CREATININE: 1.16 mg/dL (ref 0.61–1.24)
GFR calc non Af Amer: 56 mL/min — ABNORMAL LOW (ref >60)
Glucose, Bld: 155 mg/dL — ABNORMAL HIGH (ref 65–99)
Potassium: 3.7 mmol/L (ref 3.5–5.1)
Sodium: 131 mmol/L — ABNORMAL LOW (ref 135–145)
Total Protein: 6.9 g/dL (ref 6.5–8.1)

## 2017-04-21 LAB — CBC
HEMATOCRIT: 40.5 % (ref 39.0–52.0)
HEMOGLOBIN: 14.2 g/dL (ref 13.0–17.0)
MCH: 32.9 pg (ref 26.0–34.0)
MCHC: 35.1 g/dL (ref 30.0–36.0)
MCV: 93.8 fL (ref 78.0–100.0)
Platelets: 175 10*3/uL (ref 150–400)
RBC: 4.32 MIL/uL (ref 4.22–5.81)
RDW: 12.6 % (ref 11.5–15.5)
WBC: 6.7 10*3/uL (ref 4.0–10.5)

## 2017-04-21 LAB — I-STAT CHEM 8, ED
BUN: 12 mg/dL (ref 6–20)
CREATININE: 1.1 mg/dL (ref 0.61–1.24)
Calcium, Ion: 1.06 mmol/L — ABNORMAL LOW (ref 1.15–1.40)
Chloride: 93 mmol/L — ABNORMAL LOW (ref 101–111)
GLUCOSE: 155 mg/dL — AB (ref 65–99)
HEMATOCRIT: 42 % (ref 39.0–52.0)
HEMOGLOBIN: 14.3 g/dL (ref 13.0–17.0)
POTASSIUM: 3.7 mmol/L (ref 3.5–5.1)
Sodium: 133 mmol/L — ABNORMAL LOW (ref 135–145)
TCO2: 27 mmol/L (ref 22–32)

## 2017-04-21 LAB — MRSA PCR SCREENING: MRSA by PCR: NEGATIVE

## 2017-04-21 LAB — DIFFERENTIAL
BASOS ABS: 0 10*3/uL (ref 0.0–0.1)
BASOS PCT: 0 %
Eosinophils Absolute: 0.2 10*3/uL (ref 0.0–0.7)
Eosinophils Relative: 2 %
LYMPHS ABS: 1.7 10*3/uL (ref 0.7–4.0)
LYMPHS PCT: 25 %
MONO ABS: 0.6 10*3/uL (ref 0.1–1.0)
MONOS PCT: 9 %
Neutro Abs: 4.3 10*3/uL (ref 1.7–7.7)
Neutrophils Relative %: 64 %

## 2017-04-21 LAB — APTT: APTT: 28 s (ref 24–36)

## 2017-04-21 LAB — PROTIME-INR
INR: 1.01
Prothrombin Time: 13.2 seconds (ref 11.4–15.2)

## 2017-04-21 LAB — I-STAT TROPONIN, ED: TROPONIN I, POC: 0.01 ng/mL (ref 0.00–0.08)

## 2017-04-21 MED ORDER — LABETALOL HCL 5 MG/ML IV SOLN
20.0000 mg | Freq: Once | INTRAVENOUS | Status: AC
  Administered 2017-04-21: 20 mg via INTRAVENOUS

## 2017-04-21 MED ORDER — PANTOPRAZOLE SODIUM 40 MG IV SOLR
40.0000 mg | Freq: Every day | INTRAVENOUS | Status: DC
  Administered 2017-04-21: 40 mg via INTRAVENOUS
  Filled 2017-04-21: qty 40

## 2017-04-21 MED ORDER — DIPHENHYDRAMINE HCL 50 MG/ML IJ SOLN
25.0000 mg | Freq: Once | INTRAMUSCULAR | Status: AC
  Administered 2017-04-21: 25 mg via INTRAVENOUS
  Filled 2017-04-21: qty 1

## 2017-04-21 MED ORDER — PANTOPRAZOLE SODIUM 40 MG PO TBEC: 40.0000 mg | DELAYED_RELEASE_TABLET | Freq: Every day | ORAL | Status: DC

## 2017-04-21 MED ORDER — ACETAMINOPHEN 650 MG RE SUPP: 650.0000 mg | RECTAL | Status: DC | PRN

## 2017-04-21 MED ORDER — ALTEPLASE (STROKE) FULL DOSE INFUSION
0.9000 mg/kg | Freq: Once | INTRAVENOUS | Status: AC
  Administered 2017-04-21: 80 mg via INTRAVENOUS
  Filled 2017-04-21: qty 100

## 2017-04-21 MED ORDER — METHYLPREDNISOLONE SODIUM SUCC 125 MG IJ SOLR
INTRAMUSCULAR | Status: AC
  Filled 2017-04-21: qty 2

## 2017-04-21 MED ORDER — SODIUM CHLORIDE 0.9 % IV SOLN
INTRAVENOUS | Status: DC
  Administered 2017-04-21: 18:00:00 via INTRAVENOUS

## 2017-04-21 MED ORDER — NICARDIPINE HCL IN NACL 20-0.86 MG/200ML-% IV SOLN: 0.0000 mg/h | INTRAVENOUS | Status: DC

## 2017-04-21 MED ORDER — ACETAMINOPHEN 325 MG PO TABS: 650.0000 mg | ORAL_TABLET | ORAL | Status: DC | PRN

## 2017-04-21 MED ORDER — ACETAMINOPHEN 160 MG/5ML PO SOLN: 650.0000 mg | ORAL | Status: DC | PRN

## 2017-04-21 MED ORDER — SENNOSIDES-DOCUSATE SODIUM 8.6-50 MG PO TABS: 1.0000 | ORAL_TABLET | Freq: Every evening | ORAL | Status: DC | PRN

## 2017-04-21 MED ORDER — IOPAMIDOL (ISOVUE-370) INJECTION 76%
INTRAVENOUS | Status: AC
  Administered 2017-04-21: 50 mL via INTRAVENOUS
  Filled 2017-04-21: qty 50

## 2017-04-21 MED ORDER — CLEVIDIPINE BUTYRATE 0.5 MG/ML IV EMUL
0.0000 mg/h | INTRAVENOUS | Status: DC
  Administered 2017-04-21: 1 mg/h via INTRAVENOUS

## 2017-04-21 MED ORDER — METHYLPREDNISOLONE SODIUM SUCC 125 MG IJ SOLR
125.0000 mg | Freq: Once | INTRAMUSCULAR | Status: AC
  Administered 2017-04-21: 125 mg via INTRAVENOUS
  Filled 2017-04-21: qty 2

## 2017-04-21 MED ORDER — METFORMIN HCL ER 500 MG PO TB24
500.0000 mg | ORAL_TABLET | Freq: Two times a day (BID) | ORAL | Status: DC
  Filled 2017-04-21 (×4): qty 1

## 2017-04-21 MED ORDER — STROKE: EARLY STAGES OF RECOVERY BOOK
Freq: Once | Status: AC
  Administered 2017-04-21: 20:00:00
  Filled 2017-04-21: qty 1

## 2017-04-21 MED ORDER — FAMOTIDINE IN NACL 20-0.9 MG/50ML-% IV SOLN
20.0000 mg | Freq: Once | INTRAVENOUS | Status: AC
  Administered 2017-04-21: 20 mg via INTRAVENOUS
  Filled 2017-04-21: qty 50

## 2017-04-21 NOTE — ED Provider Notes (Signed)
[facility] DEPT Provider Note   CSN: [id number] Arrival date & time: 04/21/17  1120     History   Chief Complaint No chief complaint on file.   HPI [name] is a 83 y.o. male.  HPI  The patient is an 83 year old male, history of multiple TIAs as well as strokes, he has been admitted to the hospital as recently as May of this year with similar symptoms however according to his wife the patient this morning had acute onset of difficulty with expressive aphasia and some receptive aphasia, there is also a concern of waxing and waning right-sided hemiparesis.  This seemed to get slightly worse upon arrival. When neurology saw and they recommended immediate thrombolytic therapy after the CT scan showed no hemorrhage. He is also severely hypertensive, the patient is unable to give any information thus level V caveat applies.  Past Medical History:  Diagnosis Date  . Arthritis    "wrists, knees, back" (12/10/2015)  . Asthma   . Cerebral atherosclerosis   . Chronic back pain    "lower primarily" (12/10/2015)  . CORONARY ATHEROSCLEROSIS NATIVE CORONARY ARTERY    Myocardial infarction at [facility] in 1996 with a directional atherectomy of the 90% LAD stenosis and medical management of 60-70% proximal circumflex and 50% RCA stenosis.  . DYSLIPIDEMIA   . GERD (gastroesophageal reflux disease)   . History of hiatal hernia   . HYPERTENSION   . ILIAC ARTERY ANEURYSM   . Myocardial infarction ([place]) 1996  . Stroke [facility]) 2003; 2013; 07/2015; 12/05/2015   denies residual on 12/10/2015, 12/13/16, 01/08/17  . TIA (transient ischemic attack)   . Type II diabetes mellitus ([place])   . UPPER RESPIRATORY INFECTION     Patient Active Problem List   Diagnosis Date Noted  . PFO (patent foramen ovale) 12/30/2016  . Dyslipidemia 12/30/2016  . Cryptogenic stroke ([place])   . Numbness of tongue 12/13/2016  . Hyponatremia 12/13/2016  . GERD (gastroesophageal reflux disease) 02/25/2016  . Ischemic  cerebrovascular accident (CVA) of frontal lobe ([place]) 12/15/2015  . Malnutrition of moderate degree 12/11/2015  . History of stroke 12/10/2015  . DM type 2 (diabetes mellitus, type 2) ([place]) 12/06/2015  . Dysarthria 12/05/2015  . CKD (chronic kidney disease) stage 3, GFR 30-59 ml/min 07/17/2015  . Cerebral infarction ([place]) 07/15/2015  . TIA (transient ischemic attack) 09/12/2013  . Metabolic syndrome 88/41/6606  . Vitamin D deficiency 08/01/2013  . PVD (peripheral vascular disease) ([place]) 11/25/2011  . ILIAC ARTERY ANEURYSM 07/17/2010  . Hyperlipidemia LDL goal <70 08/28/2009  . CORONARY ATHEROSCLEROSIS NATIVE CORONARY ARTERY 08/28/2009  . Essential hypertension 06/05/2009  . Cerebral atherosclerosis 06/04/2009    Past Surgical History:  Procedure Laterality Date  . ANAL FISSURE REPAIR  1990s  . BASAL CELL CARCINOMA EXCISION Right 12/2015   "temple"  . CATARACT EXTRACTION W/ INTRAOCULAR LENS  IMPLANT, BILATERAL Bilateral 2000s  . CORONARY ANGIOPLASTY  1996  . KNEE CARTILAGE SURGERY Right 1970s  . LOOP RECORDER INSERTION N/A 12/15/2016   Procedure: Loop Recorder Insertion;  Surgeon: Thompson Grayer, MD;  Location: [place] CV LAB;  Service: Cardiovascular;  Laterality: N/A;  . SHOULDER ARTHROSCOPY W/ ROTATOR CUFF REPAIR Left 2000s  . TEE WITHOUT CARDIOVERSION N/A 12/15/2016   Procedure: TRANSESOPHAGEAL ECHOCARDIOGRAM (TEE);  Surgeon: Lelon Perla, MD;  Location: [facility] ENDOSCOPY;  Service: Cardiovascular;  Laterality: N/A;       Home Medications    Prior to Admission medications   Medication Sig Start Date End Date  Taking? Authorizing Provider  aspirin EC 81 MG tablet Take 1 tablet (81 mg total) by mouth daily. 12/15/16 12/15/17  Geradine Girt, DO  atorvastatin (LIPITOR) 40 MG tablet Take 1 tablet (40 mg total) by mouth daily. Patient taking differently: Take 40 mg by mouth every evening.  12/15/16   Geradine Girt, DO  BAYER CONTOUR TEST test strip USE ONE STRIP TO CHECK  GLUCOSE TWICE DAILY AND  AS  NEEDED 02/05/17   Timmothy Euler, MD  benazepril (LOTENSIN) 20 MG tablet TAKE 1 TABLET BY MOUTH ONCE DAILY 01/11/17   Timmothy Euler, MD  Cholecalciferol (VITAMIN D) 2000 units CAPS Take 2,000 Units by mouth daily.    [provider]  clopidogrel (PLAVIX) 75 MG tablet TAKE ONE TABLET BY MOUTH ONCE DAILY 10/12/16   Timmothy Euler, MD  feeding supplement, GLUCERNA SHAKE, (GLUCERNA SHAKE) LIQD Take 237 mLs by mouth 2 (two) times daily between meals. 12/13/15   Timmothy Euler, MD  fish oil-omega-3 fatty acids 1000 MG capsule Take 2 g by mouth daily.      [provider]  Fluticasone-Salmeterol (ADVAIR DISKUS) 100-50 MCG/DOSE AEPB INHALE ONE DOSE BY MOUTH EVERY 12 HOURS 12/15/16   Eulogio Bear U, DO  hydrALAZINE (APRESOLINE) 25 MG tablet Take 1 tablet (25 mg total) by mouth daily as needed. 05/27/16 03/24/17  Minus Breeding, MD  hydrochlorothiazide (HYDRODIURIL) 25 MG tablet TAKE ONE-HALF TABLET BY MOUTH ONCE DAILY EXCEPT  ON  MONDAY,  WEDNESDAY,  AND  FRIDAY  TAKE  A  WHOLE  TABLET 03/02/17   Timmothy Euler, MD  metFORMIN (GLUCOPHAGE-XR) 500 MG 24 hr tablet TAKE ONE TABLET BY MOUTH TWICE DAILY 03/02/17   Timmothy Euler, MD  metoprolol tartrate (LOPRESSOR) 100 MG tablet TAKE ONE TABLET BY MOUTH TWICE DAILY 03/02/17   Timmothy Euler, MD  nitroGLYCERIN (NITROSTAT) 0.4 MG SL tablet Place 0.4 mg under the tongue every 5 (five) minutes as needed for chest pain.    [provider]  pantoprazole (PROTONIX) 40 MG tablet TAKE ONE TABLET BY MOUTH ONCE DAILY 03/26/17   Timmothy Euler, MD  PROAIR HFA 108 [phone] Base) MCG/ACT inhaler INHALE 2 PUFFS BY MOUTH 4 TIMES DAILY AS NEEDED 12/16/16   Timmothy Euler, MD  traZODone (DESYREL) 50 MG tablet Take 0.5-1 tablets (25-50 mg total) by mouth at bedtime as needed for sleep. 01/15/17   Timmothy Euler, MD    Family History Family History  Problem Relation Age of Onset  . Coronary artery  disease Mother   . Cancer Father        lung  . Stroke Father   . Cancer Brother        lung  . Stroke Sister     Social History Social History  Substance Use Topics  . Smoking status: Former Smoker    Packs/day: 0.12    Years: 45.00    Types: Cigarettes    Quit date: 11/24/1984  . Smokeless tobacco: Former Systems developer    Types: Snuff     Comment: "dipped snuff when I was a boy"  . Alcohol use No     Allergies   Codeine; Lopid [gemfibrozil]; and Voltaren [diclofenac sodium]   Review of Systems Review of Systems  Unable to perform ROS: Acuity of condition     Physical Exam Updated Vital Signs Ht 5\' 11"  (1.803 m)   Wt 88.9 kg (195 lb 15.8 oz)   BMI 27.33 kg/m  Physical Exam  Constitutional: He appears well-developed and well-nourished. No distress.  HENT:  Head: Normocephalic and atraumatic.  Mouth/Throat: Oropharynx is clear and moist. No oropharyngeal exudate.  Eyes: Pupils are equal, round, and reactive to light. Conjunctivae and EOM are normal. Right eye exhibits no discharge. Left eye exhibits no discharge. No scleral icterus.  Neck: Normal range of motion. Neck supple. No JVD present. No thyromegaly present.  Cardiovascular: Normal rate, regular rhythm, normal heart sounds and intact distal pulses.  Exam reveals no gallop and no friction rub.   No murmur heard. Pulmonary/Chest: Effort normal and breath sounds normal. No respiratory distress. He has no wheezes. He has no rales.  Abdominal: Soft. Bowel sounds are normal. He exhibits no distension and no mass. There is no tenderness.  Musculoskeletal: Normal range of motion. He exhibits no edema or tenderness.  Lymphadenopathy:    He has no cervical adenopathy.  Neurological: He is alert. Coordination normal.  Expressive and receptive aphasia, able to move all 4 extremities, there is no obvious weakness lateralizing at this time. There is no obvious facial droop at this time.  Skin: Skin is warm and dry. No rash  noted. No erythema.  Psychiatric: He has a normal mood and affect. His behavior is normal.  Nursing note and vitals reviewed.    ED Treatments / Results  Labs (all labs ordered are listed, but only abnormal results are displayed) Labs Reviewed  I-STAT CHEM 8, ED - Abnormal; Notable for the following:       Result Value   Sodium 133 (*)    Chloride 93 (*)    Glucose, Bld 155 (*)    Calcium, Ion 1.06 (*)    All other components within normal limits  PROTIME-INR  APTT  CBC  DIFFERENTIAL  ETHANOL  COMPREHENSIVE METABOLIC PANEL  RAPID URINE DRUG SCREEN, HOSP PERFORMED  URINALYSIS, ROUTINE W REFLEX MICROSCOPIC  I-STAT TROPONIN, ED    EKG  EKG Interpretation  Date/Time:  Wednesday April 21 2017 12:34:49 EDT Ventricular Rate:  81 PR Interval:    QRS Duration: 87 QT Interval:  391 QTC Calculation: 454 R Axis:   2 Text Interpretation:  Sinus rhythm Normal ECG since last tracing no significant change Confirmed by Noemi Chapel [phone]) on 04/21/2017 12:44:09 PM       Radiology Ct Head Code Stroke Wo Contrast  Result Date: 04/21/2017 CLINICAL DATA:  Code stroke. LEFT-sided weakness, inability to speak. EXAM: CT HEAD WITHOUT CONTRAST TECHNIQUE: Contiguous axial images were obtained from the base of the skull through the vertex without intravenous contrast. COMPARISON:  CT head 01/08/2017.  Most recent MR 12/14/2016. FINDINGS: Brain: No evidence for acute infarction, hemorrhage, mass lesion, hydrocephalus, or extra-axial fluid. Advanced atrophy. Chronic microvascular ischemic change. Widespread areas of chronic lacunar infarction. Vascular: Calcification of the cavernous internal carotid arteries and distal vertebral arteries, consistent with cerebrovascular atherosclerotic disease. No signs of intracranial large vessel occlusion. Skull: Normal. Negative for fracture or focal lesion. Sinuses/Orbits: No acute finding. Other: Compared with most recent prior, similar appearance.  ASPECTS [facility] Stroke Program Early CT Score) - Ganglionic level infarction (caudate, lentiform nuclei, internal capsule, insula, M1-M3 cortex): 7 - Supraganglionic infarction (M4-M6 cortex): 3 Total score (0-10 with 10 being normal): 10 IMPRESSION: 1. Atrophy and small vessel disease. No acute intracranial findings. 2. ASPECTS is 10. These results were called by telephone at the time of interpretation on 04/21/2017 at 11:49 am to Dr. Leonie Man , who verbally acknowledged these results. Electronically Signed  By: Staci Righter M.D.   On: 04/21/2017 11:51    Procedures Procedures (including critical care time)  CRITICAL CARE Performed by: Johnna Acosta Total critical care time: 35 minutes Critical care time was exclusive of separately billable procedures and treating other patients. Critical care was necessary to treat or prevent imminent or life-threatening deterioration. Critical care was time spent personally by me on the following activities: development of treatment plan with patient and/or surrogate as well as nursing, discussions with consultants, evaluation of patient's response to treatment, examination of patient, obtaining history from patient or surrogate, ordering and performing treatments and interventions, ordering and review of laboratory studies, ordering and review of radiographic studies, pulse oximetry and re-evaluation of patient's condition.   Medications Ordered in ED Medications  iopamidol (ISOVUE-370) 76 % injection (50 mLs Intravenous Contrast Given 04/21/17 1213)     Initial Impression / Assessment and Plan / ED Course  I have reviewed the triage vital signs and the nursing notes.  Pertinent labs & imaging results that were available during my care of the patient were reviewed by me and considered in my medical decision making (see chart for details).      The patient had a code stroke activation on arrival, neurology, Dr. Leonie Man saw the patient and recommended  thrombotic therapy as well as a Cleviprex drip for the patient's severe hypertension. He is critically ill and will need to go to the neuro ICU this evening.  Frequent neurologic checks will be initiated, we'll evaluate for consultations of bleeding. We'll discuss with the significant other the treatment course. She has expressed her understanding this far. The patient is critically ill.  Final Clinical Impressions(s) / ED Diagnoses   Final diagnoses:  Acute ischemic stroke [facility])    New Prescriptions New Prescriptions   No medications on file     Noemi Chapel, MD 04/21/17 1244

## 2017-04-21 NOTE — ED Notes (Signed)
Pt began to have increase slurred speech and noticed left cheek swelling when pt opened mouth he was noted to have swelling on his tongue. MD miller made aware and MD sethi also paged.

## 2017-04-21 NOTE — ED Notes (Addendum)
Pt improving, able to swallow and can speak still remains slightly garbled. This RN at bedside with pt to monitor.

## 2017-04-21 NOTE — ED Notes (Signed)
Family at bedside. 

## 2017-04-21 NOTE — ED Notes (Signed)
Condom cathter applied.

## 2017-04-21 NOTE — ED Notes (Signed)
Bedside report given to [facility]. Family remains at bedside, pts tongue swelling has improved. Pt breathing easily and in no distress.

## 2017-04-21 NOTE — ED Notes (Signed)
Pulled an additional dose of solu-medrol sue to the first vial being defective

## 2017-04-21 NOTE — Consult Note (Addendum)
Reason for Consult: code stroke Referring Physician:  Edward Qualia, MD  [name] is an 83 y.o. male.  HPI: [name] is a 79 year Caucasian male who is known to me from prior admission for stroke in office visit. History is obtained from the patient's wife is available at the bedside as well as from EMS and review of electronic medical records. The patient apparently had sudden onset of right upper extremity weakness at 10:15 AM today. He also had trouble speaking and understanding. By the time EMS arrived they notice and wrote initial improvement in his right-sided weakness as well as speech to some extent but by the time they reached [facility] the patient's speech had again gotten significantly worse but he did not have any recurrent right-sided weakness. Admit the patient upon arrival and CT scan of the head was obtained which showed no acute abnormalities. Patient blood pressure significantly elevated upon admission but this was controlled with IV nicardipine drip and he was given IV TPA with daughter negative time being 31 minutes. There was some delay secondary to starting an additional IV for emergent CT angiogram was ordered. CT angiogram personally reviewed shows no large vessel occlusion. Patient tolerated TPA infusion well but after the infusion was completed developed the significant swelling of the tongue as well as left side of the cheek. This was felt to be angioedema and he was given IV Solu-Medrol and Benadryl. She did not appear to be in respiratory distress. The patient apparently had been doing well until prior to his onset of stroke symptoms. He had denied any headache, vomiting and loss of vision. He had a prior history of embolic right MCA branch infarcts in May 2018 for which he was hospitalized at [facility]. He had extensive evaluation including TEE as well as loop recorder insertion. He was seen in the office a few weeks ago and had been doing well he was on aspirin and  Plavix. Patient had not had any recent surgeries. He had no known prior allergy to TPA. Last seen nprmal; ; 10 :15  On 04/21/17  IV TPA given ; Yes baseline MRS 1 NIHSS: Initial  Level of Consciousness (1a.)   : Alert, keenly responsive  LOC Questions (1b. )   +: Answers one question correctly   LOC Commands (1c. )   + : Performs both tasks correctly   Best Gaze (2. )  +: Normal  Visual (3. )  +: Partial hemianopia  Facial Palsy (4. )    : Minor paralysis   Motor Arm, Left (5a. )   +: No drift   Motor Arm, Right (5b. )   +: No drift   Motor Leg, Left (6a. )   +: No drift   Motor Leg, Right (6b. )   +: No drift  Limb Ataxia (7. ): Absent   Sensory (8. )   +: Normal, no sensory loss    Best Language (9. )   +: Severe aphasia  Dysarthria (10. ): Normal   Extinction/Inattention (11.)   +: No Abnormality   Total: 5        Past Medical History:  Diagnosis Date  . Arthritis    "wrists, knees, back" (12/10/2015)  . Asthma   . Cerebral atherosclerosis   . Chronic back pain    "lower primarily" (12/10/2015)  . CORONARY ATHEROSCLEROSIS NATIVE CORONARY ARTERY    Myocardial infarction at [facility] in 1996 with a directional atherectomy of the 90%  LAD stenosis and medical management of 60-70% proximal circumflex and 50% RCA stenosis.  . DYSLIPIDEMIA   . GERD (gastroesophageal reflux disease)   . History of hiatal hernia   . HYPERTENSION   . ILIAC ARTERY ANEURYSM   . Myocardial infarction ([place]) 1996  . Stroke [facility]) 2003; 2013; 07/2015; 12/05/2015   denies residual on 12/10/2015, 12/13/16, 01/08/17  . TIA (transient ischemic attack)   . Type II diabetes mellitus ([place])   . UPPER RESPIRATORY INFECTION     Past Surgical History:  Procedure Laterality Date  . ANAL FISSURE REPAIR  1990s  . BASAL CELL CARCINOMA EXCISION Right 12/2015   "temple"  . CATARACT EXTRACTION W/ INTRAOCULAR LENS  IMPLANT, BILATERAL Bilateral 2000s  . CORONARY ANGIOPLASTY  1996  . KNEE CARTILAGE SURGERY Right 1970s  .  LOOP RECORDER INSERTION N/A 12/15/2016   Procedure: Loop Recorder Insertion;  Surgeon: Thompson Grayer, MD;  Location: [facility];  Service: Cardiovascular;  Laterality: N/A;  . SHOULDER ARTHROSCOPY W/ ROTATOR CUFF REPAIR Left 2000s  . TEE WITHOUT CARDIOVERSION N/A 12/15/2016   Procedure: TRANSESOPHAGEAL ECHOCARDIOGRAM (TEE);  Surgeon: Lelon Perla, MD;  Location: [facility] ENDOSCOPY;  Service: Cardiovascular;  Laterality: N/A;    Family History  Problem Relation Age of Onset  . Coronary artery disease Mother   . Cancer Father        lung  . Stroke Father   . Cancer Brother        lung  . Stroke Sister     Social History:  reports that he quit smoking about 32 years ago. His smoking use included Cigarettes. He has a 5.40 pack-year smoking history. He has quit using smokeless tobacco. His smokeless tobacco use included Snuff. He reports that he does not drink alcohol or use drugs.  Allergies:  Allergies  Allergen Reactions  . Alteplase Anaphylaxis  . Codeine Other (See Comments)    Bad dreams  . Lopid [Gemfibrozil] Nausea Only  . Voltaren [Diclofenac Sodium] Diarrhea    Medications: I have reviewed the patient's current medications.  Results for orders placed or performed during the hospital encounter of 04/21/17 (from the past 48 hour(s))  Protime-INR     Status: None   Collection Time: 04/21/17 11:27 AM  Result Value Ref Range   Prothrombin Time 13.2 11.4 - 15.2 seconds   INR 1.01   APTT     Status: None   Collection Time: 04/21/17 11:27 AM  Result Value Ref Range   aPTT 28 24 - 36 seconds  CBC     Status: None   Collection Time: 04/21/17 11:27 AM  Result Value Ref Range   WBC 6.7 4.0 - 10.5 K/uL   RBC 4.32 4.22 - 5.81 MIL/uL   Hemoglobin 14.2 13.0 - 17.0 g/dL   HCT 40.5 39.0 - 52.0 %   MCV 93.8 78.0 - 100.0 fL   MCH 32.9 26.0 - 34.0 pg   MCHC 35.1 30.0 - 36.0 g/dL   RDW 12.6 11.5 - 15.5 %   Platelets 175 150 - 400 K/uL  Differential     Status: None    Collection Time: 04/21/17 11:27 AM  Result Value Ref Range   Neutrophils Relative % 64 %   Neutro Abs 4.3 1.7 - 7.7 K/uL   Lymphocytes Relative 25 %   Lymphs Abs 1.7 0.7 - 4.0 K/uL   Monocytes Relative 9 %   Monocytes Absolute 0.6 0.1 - 1.0 K/uL   Eosinophils Relative 2 %  Eosinophils Absolute 0.2 0.0 - 0.7 K/uL   Basophils Relative 0 %   Basophils Absolute 0.0 0.0 - 0.1 K/uL  Comprehensive metabolic panel     Status: Abnormal   Collection Time: 04/21/17 11:27 AM  Result Value Ref Range   Sodium 131 (L) 135 - 145 mmol/L   Potassium 3.7 3.5 - 5.1 mmol/L   Chloride 94 (L) 101 - 111 mmol/L   CO2 24 22 - 32 mmol/L   Glucose, Bld 155 (H) 65 - 99 mg/dL   BUN 11 6 - 20 mg/dL   Creatinine, Ser 4.88 0.61 - 1.24 mg/dL   Calcium 9.2 8.9 - 00.9 mg/dL   Total Protein 6.9 6.5 - 8.1 g/dL   Albumin 4.3 3.5 - 5.0 g/dL   AST 23 15 - 41 U/L   ALT 18 17 - 63 U/L   Alkaline Phosphatase 66 38 - 126 U/L   Total Bilirubin 1.3 (H) 0.3 - 1.2 mg/dL   GFR calc non Af Amer 56 (L) >60 mL/min   GFR calc Af Amer >60 >60 mL/min    Comment: (NOTE) The eGFR has been calculated using the CKD EPI equation. This calculation has not been validated in all clinical situations. eGFR's persistently <60 mL/min signify possible Chronic Kidney Disease.    Anion gap 13 5 - 15  I-stat troponin, ED     Status: None   Collection Time: 04/21/17 11:32 AM  Result Value Ref Range   Troponin i, poc 0.01 0.00 - 0.08 ng/mL   Comment 3            Comment: Due to the release kinetics of cTnI, a negative result within the first hours of the onset of symptoms does not rule out myocardial infarction with certainty. If myocardial infarction is still suspected, repeat the test at appropriate intervals.   I-Stat Chem 8, ED     Status: Abnormal   Collection Time: 04/21/17 11:33 AM  Result Value Ref Range   Sodium 133 (L) 135 - 145 mmol/L   Potassium 3.7 3.5 - 5.1 mmol/L   Chloride 93 (L) 101 - 111 mmol/L   BUN 12 6 - 20  mg/dL   Creatinine, Ser 9.58 0.61 - 1.24 mg/dL   Glucose, Bld 593 (H) 65 - 99 mg/dL   Calcium, Ion 8.41 (L) 1.15 - 1.40 mmol/L   TCO2 27 22 - 32 mmol/L   Hemoglobin 14.3 13.0 - 17.0 g/dL   HCT 15.2 76.6 - 41.5 %    Ct Angio Head W Or Wo Contrast  Result Date: 04/21/2017 CLINICAL DATA:  LEFT-sided weakness.  This began earlier today. EXAM: CT ANGIOGRAPHY HEAD AND NECK TECHNIQUE: Multidetector CT imaging of the head and neck was performed using the standard protocol during bolus administration of intravenous contrast. Multiplanar CT image reconstructions and MIPs were obtained to evaluate the vascular anatomy. Carotid stenosis measurements (when applicable) are obtained utilizing NASCET criteria, using the distal internal carotid diameter as the denominator. CONTRAST:  Isovue 370, 50 mL. COMPARISON:  CT head code stroke earlier today.  MR head 12/14/2016. FINDINGS: CTA NECK Aortic arch: Standard branching. Imaged portion shows no evidence of aneurysm or dissection. Estimated 50% stenosis at the origin of the RIGHT subclavian artery. Non stenotic atheromatous change at the origin LEFT subclavian, RIGHT common carotid, and LEFT common carotid arteries. Transverse arch atheromatous change without significant ulceration. Atheromatous change Right carotid system: Heavily calcified, but less than 50% stenosis, of the proximal RIGHT internal carotid artery based on  luminal measurements of 2.7/4.2 proximal/distal. No significant soft plaque or ulceration. No dissection. Left carotid system: Heavily calcified plaque. No luminal narrowing or dissection. Vertebral arteries: Codominant. No evidence of dissection, stenosis (50% or greater) or occlusion. BILATERAL ostial calcification is non stenotic. Nonvascular soft tissues: Early emphysematous change but no pneumothorax or lung mass. No neck masses. Cervical spondylosis. CTA HEAD Anterior circulation: Calcification of the cavernous internal carotid arteries consistent  with cerebrovascular atherosclerotic disease. No significant stenosis, proximal occlusion, aneurysm, or vascular malformation. Posterior circulation: Both vertebrals contribute to formation of basilar. There is a 75% stenosis of the distal RIGHT vertebral. Long segment stenosis distal LEFT vertebral, 50%. No basilar stenosis. 50% stenosis of LEFT P1 PCA. No cerebellar branch occlusion. Venous sinuses: As permitted by contrast timing, patent. Anatomic variants: None of significance. Delayed phase:  Not performed. IMPRESSION: No flow reducing extracranial stenosis. Heavily calcified carotid bifurcations bilaterally without significant luminal narrowing. No intracranial large vessel occlusion or significant skull base ICA narrowing. BILATERAL distal vertebral artery stenoses, greater on the RIGHT. Findings discussed with ordering provider at time of interpretation. Electronically Signed   By: Staci Righter M.D.   On: 04/21/2017 12:30   Ct Angio Neck W Or Wo Contrast  Result Date: 04/21/2017 CLINICAL DATA:  LEFT-sided weakness.  This began earlier today. EXAM: CT ANGIOGRAPHY HEAD AND NECK TECHNIQUE: Multidetector CT imaging of the head and neck was performed using the standard protocol during bolus administration of intravenous contrast. Multiplanar CT image reconstructions and MIPs were obtained to evaluate the vascular anatomy. Carotid stenosis measurements (when applicable) are obtained utilizing NASCET criteria, using the distal internal carotid diameter as the denominator. CONTRAST:  Isovue 370, 50 mL. COMPARISON:  CT head code stroke earlier today.  MR head 12/14/2016. FINDINGS: CTA NECK Aortic arch: Standard branching. Imaged portion shows no evidence of aneurysm or dissection. Estimated 50% stenosis at the origin of the RIGHT subclavian artery. Non stenotic atheromatous change at the origin LEFT subclavian, RIGHT common carotid, and LEFT common carotid arteries. Transverse arch atheromatous change without  significant ulceration. Atheromatous change Right carotid system: Heavily calcified, but less than 50% stenosis, of the proximal RIGHT internal carotid artery based on luminal measurements of 2.7/4.2 proximal/distal. No significant soft plaque or ulceration. No dissection. Left carotid system: Heavily calcified plaque. No luminal narrowing or dissection. Vertebral arteries: Codominant. No evidence of dissection, stenosis (50% or greater) or occlusion. BILATERAL ostial calcification is non stenotic. Nonvascular soft tissues: Early emphysematous change but no pneumothorax or lung mass. No neck masses. Cervical spondylosis. CTA HEAD Anterior circulation: Calcification of the cavernous internal carotid arteries consistent with cerebrovascular atherosclerotic disease. No significant stenosis, proximal occlusion, aneurysm, or vascular malformation. Posterior circulation: Both vertebrals contribute to formation of basilar. There is a 75% stenosis of the distal RIGHT vertebral. Long segment stenosis distal LEFT vertebral, 50%. No basilar stenosis. 50% stenosis of LEFT P1 PCA. No cerebellar branch occlusion. Venous sinuses: As permitted by contrast timing, patent. Anatomic variants: None of significance. Delayed phase:  Not performed. IMPRESSION: No flow reducing extracranial stenosis. Heavily calcified carotid bifurcations bilaterally without significant luminal narrowing. No intracranial large vessel occlusion or significant skull base ICA narrowing. BILATERAL distal vertebral artery stenoses, greater on the RIGHT. Findings discussed with ordering provider at time of interpretation. Electronically Signed   By: Staci Righter M.D.   On: 04/21/2017 12:30   Ct Head Code Stroke Wo Contrast  Result Date: 04/21/2017 CLINICAL DATA:  Code stroke. LEFT-sided weakness, inability to speak. EXAM: CT HEAD WITHOUT  CONTRAST TECHNIQUE: Contiguous axial images were obtained from the base of the skull through the vertex without  intravenous contrast. COMPARISON:  CT head 01/08/2017.  Most recent MR 12/14/2016. FINDINGS: Brain: No evidence for acute infarction, hemorrhage, mass lesion, hydrocephalus, or extra-axial fluid. Advanced atrophy. Chronic microvascular ischemic change. Widespread areas of chronic lacunar infarction. Vascular: Calcification of the cavernous internal carotid arteries and distal vertebral arteries, consistent with cerebrovascular atherosclerotic disease. No signs of intracranial large vessel occlusion. Skull: Normal. Negative for fracture or focal lesion. Sinuses/Orbits: No acute finding. Other: Compared with most recent prior, similar appearance. ASPECTS [facility] Stroke Program Early CT Score) - Ganglionic level infarction (caudate, lentiform nuclei, internal capsule, insula, M1-M3 cortex): 7 - Supraganglionic infarction (M4-M6 cortex): 3 Total score (0-10 with 10 being normal): 10 IMPRESSION: 1. Atrophy and small vessel disease. No acute intracranial findings. 2. ASPECTS is 10. These results were called by telephone at the time of interpretation on 04/21/2017 at 11:49 am to Dr. Leonie Man , who verbally acknowledged these results. Electronically Signed   By: Staci Righter M.D.   On: 04/21/2017 11:51       ROS 14 system review of systems  positive for speech difficulty, hand weakness, confusion and all other systems negative. Blood pressure (!) 169/94, pulse 78, temperature 98 F (36.7 C), temperature source Oral, resp. rate 11, height '5\' 11"'$  (1.803 m), weight 195 lb (88.5 kg), SpO2 98 %. Physical Exam Obese elderly Caucasian male not in distress. . Afebrile. Head is nontraumatic. Neck is supple without bruit.    Cardiac exam no murmur or gallop. Lungs are clear to auscultation. Distal pulses are well felt. Neurological Exam ;  Awake alert disoriented. Fluent aphasia with significant comprehension difficulties. Able to speak short sentences and words but cannot follow commands, name or repeat. No paraphasic  errors. Follows only simple midline and few 1 step commands. No dysarthria. No gaze deviation. Blinks to threat on the left but not on the right. Minimum right lower facial asymmetry. Tongue midline. Motor system exam no upper or lower extremity drift. Symmetric and equal strength in all 4 extremities. Deep tendon reflexes are symmetric. Plantars downgoing. Sensation intact. Gait not tested.  NIH stroke scale is 5  Assessment/Plan: 58 year Caucasian male presents with sudden onset of receptive aphasia and right-sided peripheral vision loss with fluctuating exam with right hemiparesis which appears stable and improved. Likely left posterior division MCA branch infarct. Patient has prior history of embolic right MCA branch infarcts in May 2018 and multiple vascular risk factors of hypertension, hyperlipidemia, obesity, prior strokes. He had loop recorder inserted during last admission but so for paroxysmal A. fib has not yet been found Plan patient will be admitted to the intensive care unit. I have reviewed personally in conclusion exclusion criteria for IV tPA and patient qualifies. Have discussed the risk-benefit with the patient's wife who was available later and was agreeable. Given IV TPA as per protocol. Strict control of blood pressure and close neurological monitoring. Patient developed angioedema of the tongue and face unclear whether this was from TPA or Cardene but was stopped the Cardene drip and use parental blood pressure medications if needed. Check MRI scan of the brain later. Interrogate loop recorder for paroxysmal atrial fibrillation. Long discussion with the patient's wife and pastor at the bedside and answered questions about his care. Discussed with Dr. Noemi Chapel. This patient is critically ill and at significant risk of neurological worsening, death and care requires constant monitoring of vital signs, hemodynamics,respiratory  and cardiac monitoring, extensive review of multiple  databases, frequent neurological assessment, discussion with family, other specialists and medical decision making of high complexity.I have made any additions or clarifications directly to the above note.This critical care time does not reflect procedure time, or teaching time or supervisory time of PA/NP/Med Resident etc but could involve care discussion time.  I spent 100 minutes of neurocritical care time  in the care of  this patient.     SETHI,PRAMOD 04/21/2017, 3:27 PM    Note: This document was prepared with digital dictation and possible smart phrase technology. Any transcriptional errors that result from this process are unintentional.

## 2017-04-21 NOTE — Plan of Care (Signed)
Problem: Nutrition: Goal: Risk of aspiration will decrease Outcome: Progressing Patient NPO after failed bedside swallow screen.

## 2017-04-21 NOTE — ED Notes (Signed)
MD Seithi at bedside due to angioedema. Cleviprex stopped also per MD.

## 2017-04-22 ENCOUNTER — Inpatient Hospital Stay (HOSPITAL_COMMUNITY): Payer: Medicare Other

## 2017-04-22 ENCOUNTER — Encounter (HOSPITAL_COMMUNITY): Payer: Self-pay

## 2017-04-22 DIAGNOSIS — G9340 Encephalopathy, unspecified: Secondary | ICD-10-CM

## 2017-04-22 LAB — ECHOCARDIOGRAM COMPLETE
HEIGHTINCHES: 71 in
Weight: 3139.35 oz

## 2017-04-22 LAB — LIPID PANEL
CHOL/HDL RATIO: 2.7 ratio
CHOLESTEROL: 109 mg/dL (ref 0–200)
HDL: 41 mg/dL (ref >40)
LDL Cholesterol: 56 mg/dL (ref 0–99)
Triglycerides: 62 mg/dL (ref <150)
VLDL: 12 mg/dL (ref 0–40)

## 2017-04-22 LAB — HEMOGLOBIN A1C
Hgb A1c MFr Bld: 5.7 % — ABNORMAL HIGH (ref 4.8–5.6)
MEAN PLASMA GLUCOSE: 116.89 mg/dL

## 2017-04-22 MED ORDER — ALBUTEROL SULFATE (2.5 MG/3ML) 0.083% IN NEBU: 2.5000 mg | INHALATION_SOLUTION | Freq: Four times a day (QID) | RESPIRATORY_TRACT | Status: DC | PRN

## 2017-04-22 MED ORDER — ATORVASTATIN CALCIUM 80 MG PO TABS: 80.0000 mg | ORAL_TABLET | Freq: Every day | ORAL | 1 refills | Status: DC

## 2017-04-22 MED ORDER — ATORVASTATIN CALCIUM 40 MG PO TABS: 40.0000 mg | ORAL_TABLET | Freq: Every day | ORAL | 1 refills | Status: DC

## 2017-04-22 MED ORDER — MOMETASONE FURO-FORMOTEROL FUM 100-5 MCG/ACT IN AERO
2.0000 | INHALATION_SPRAY | Freq: Two times a day (BID) | RESPIRATORY_TRACT | Status: DC
  Administered 2017-04-22: 2 via RESPIRATORY_TRACT
  Filled 2017-04-22: qty 8.8

## 2017-04-22 MED ORDER — INSULIN ASPART 100 UNIT/ML ~~LOC~~ SOLN: 0.0000 [IU] | SUBCUTANEOUS | Status: DC

## 2017-04-22 MED ORDER — ASPIRIN EC 81 MG PO TBEC
81.0000 mg | DELAYED_RELEASE_TABLET | Freq: Every day | ORAL | Status: DC
  Administered 2017-04-22: 81 mg via ORAL
  Filled 2017-04-22: qty 1

## 2017-04-22 MED ORDER — CLOPIDOGREL BISULFATE 75 MG PO TABS
75.0000 mg | ORAL_TABLET | Freq: Every day | ORAL | Status: DC
  Administered 2017-04-22: 75 mg via ORAL
  Filled 2017-04-22: qty 1

## 2017-04-22 MED ORDER — BENAZEPRIL HCL 20 MG PO TABS
20.0000 mg | ORAL_TABLET | Freq: Every day | ORAL | Status: DC
  Administered 2017-04-22: 20 mg via ORAL
  Filled 2017-04-22: qty 1

## 2017-04-22 MED ORDER — METOPROLOL TARTRATE 50 MG PO TABS
100.0000 mg | ORAL_TABLET | Freq: Two times a day (BID) | ORAL | Status: DC
  Administered 2017-04-22: 100 mg via ORAL
  Filled 2017-04-22: qty 2

## 2017-04-22 MED ORDER — ATORVASTATIN CALCIUM 80 MG PO TABS: 80.0000 mg | ORAL_TABLET | Freq: Every day | ORAL | Status: DC

## 2017-04-22 NOTE — Progress Notes (Signed)
PT Cancellation Note  Patient Details Name: [name] MRN: [id number] DOB: [birth year]   Cancelled Treatment:    Reason Eval/Treat Not Completed: Patient not medically ready. Pt currently on bedrest. Will await increased activity orders prior to initiating PT eval.    Thelma Comp 04/22/2017, 7:29 AM  Rolinda Roan, PT, DPT Acute Rehabilitation Services Pager: [phone]

## 2017-04-22 NOTE — Procedures (Signed)
HPI:  83 y/o with receptive aphasia  TECHNICAL SUMMARY:  A multichannel referential and bipolar montage EEG using the standard international 10-20 system was performed on the patient described as awake.  The dominant background activity consists of 5-6 hertz activity seen most prominantly over the posterior head region.   Low voltage fast (beta) activity is distributed symmetrically and maximally over the anterior head regions.  ACTIVATION:  Stepwise photic stimulation at 4-20 flashes per second was performed and did not elicit any abnormal waveforms.  Hyperventilation was not performed during  EPILEPTIFORM ACTIVITY:  There were no spikes, sharp waves or paroxysmal activity.  SLEEP:  No sleep was noted.   IMPRESSION:  This is an abnormal EEG demonstrating a mild to moderate diffuse slowing of electrocerebral activity.  This can be seen in a wide variety of encephalopathic state including those of a toxic, metabolic, or degenerative nature.  There were no focal, hemispheric, or lateralizing features.  No epileptiform activity was recorded.  This does not rule out the diagnosis of seizure disorder and if seizure remains high on the list of differential diagnoses, an ambulatory EEG may be of value.  Correlate clinically.

## 2017-04-22 NOTE — Progress Notes (Signed)
STROKE TEAM PROGRESS NOTE   HISTORY OF PRESENT ILLNESS (per record) HPI: [name] is a 83 year Caucasian male with a history of cerebral atherosclerosis, CAD, hyperlipidemia, hypertension, iliac artery aneurysm, MI, prior strokes (2003, 2013, 2016, 2017), TIA, and DM2 who presented to [facility] via EMS after sudden-onset RUE weakness and difficulty speaking and understanding language around 10:15 on 04/21/2017.  He is known to me from prior admission for stroke in office visit. History is obtained from the patient's wife is available at the bedside as well as from EMS and review of electronic medical records.  On scene, EMS noted initial improvement in his right-sided weakness as well as speech to some extent but by the time they reached [facility] the patient's speech had again gotten significantly worse but he did not have any recurrent right-sided weakness. Admit the patient upon arrival and CT scan of the head was obtained which showed no acute abnormalities. Patient blood pressure significantly elevated upon admission but this was controlled with IV nicardipine drip and he was given IV TPA with daughter negative time being 31 minutes. There was some delay secondary to starting an additional IV for emergent CT angiogram was ordered. CT angiogram personally reviewed shows no large vessel occlusion. Patient tolerated TPA infusion well but after the infusion was completed developed the significant swelling of the tongue as well as left side of the cheek. This was felt to be angioedema and he was given IV Solu-Medrol and Benadryl. She did not appear to be in respiratory distress. The patient apparently had been doing well until prior to his onset of stroke symptoms. He had denied any headache, vomiting and loss of vision. He had a prior history of embolic right MCA branch infarcts in May 2018 for which he was hospitalized at [facility]. He had extensive evaluation including TEE as well as loop recorder  insertion. He was seen in the office a few weeks ago and had been doing well he was on aspirin and Plavix. Patient had not had any recent surgeries. He had no known prior allergy to TPA. Last seen nprmal; ; 10 :15  On 04/21/17  IV TPA given ; Yes baseline MRS 1  He received IV tPA at 1246 on 04/21/2017.  He was admitted to the neuro ICU for further evaluation and treatment.   SUBJECTIVE (INTERVAL HISTORY) His wife is at the bedside.  The patient is awake, alert, and follows all commands appropriately.  His speech seems to have improved back to normal and right-sided peripheral vision loss is also improving. Blood pressure has been adequately controlled. No new complaints. Tongue swelling has resolved   OBJECTIVE Temp:  [98 F (36.7 C)-98.6 F (37 C)] 98.1 F (36.7 C) (09/20 0400) Pulse Rate:  [70-86] 81 (09/20 0700) Resp:  [11-21] 17 (09/20 0700) BP: (109-211)/(63-115) 145/63 (09/20 0700) SpO2:  [94 %-100 %] 94 % (09/20 0700) Weight:  [88.5 kg (195 lb)-89 kg (196 lb 3.4 oz)] 89 kg (196 lb 3.4 oz) (09/19 1700)  CBC:  Recent Labs Lab 04/21/17 1127 04/21/17 1133  WBC 6.7  --   NEUTROABS 4.3  --   HGB 14.2 14.3  HCT 40.5 42.0  MCV 93.8  --   PLT 175  --     Basic Metabolic Panel:  Recent Labs Lab 04/21/17 1127 04/21/17 1133  NA 131* 133*  K 3.7 3.7  CL 94* 93*  CO2 24  --   GLUCOSE 155* 155*  BUN 11 12  CREATININE 1.16 1.10  CALCIUM 9.2  --     Lipid Panel:    Component Value Date/Time   CHOL 109 04/22/2017 0158   CHOL 113 11/05/2016 0845   CHOL 132 02/02/2013 0853   TRIG 62 04/22/2017 0158   TRIG 123 02/08/2015 1009   TRIG 172 (H) 02/02/2013 0853   HDL 41 04/22/2017 0158   HDL 41 11/05/2016 0845   HDL 41 02/08/2015 1009   HDL 33 (L) 02/02/2013 0853   CHOLHDL 2.7 04/22/2017 0158   VLDL 12 04/22/2017 0158   LDLCALC 56 04/22/2017 0158   LDLCALC 49 11/05/2016 0845   LDLCALC 49 05/02/2014 0934   LDLCALC 65 02/02/2013 0853   HgbA1c:  Lab Results  Component  Value Date   HGBA1C 5.7 (H) 04/22/2017   Urine Drug Screen:    Component Value Date/Time   LABOPIA NONE DETECTED 12/13/2016 2045   COCAINSCRNUR NONE DETECTED 12/13/2016 2045   LABBENZ NONE DETECTED 12/13/2016 2045   AMPHETMU NONE DETECTED 12/13/2016 2045   THCU NONE DETECTED 12/13/2016 2045   LABBARB NONE DETECTED 12/13/2016 2045    Alcohol Level     Component Value Date/Time   ETH <5 12/13/2016 1732    IMAGING  CT Head Wo Contrast 04/21/2017 IMPRESSION: 1. Atrophy and small vessel disease. No acute intracranial findings. 2. ASPECTS is 10.  CTA Head/Neck 04/21/2017 IMPRESSION: No flow reducing extracranial stenosis. Heavily calcified carotid bifurcations bilaterally without significant luminal narrowing.  No intracranial large vessel occlusion or significant skull base ICA narrowing.  BILATERAL distal vertebral artery stenoses, greater on the RIGHT. MRI Brain Wo Contrast MRA Brain Wo Contrast 04/21/2017 IMPRESSION: 1. No acute intracranial abnormality. 2. White matter changes suggesting chronic ischemic microangiopathy. 3. Motion degraded MRA examination without large vessel occlusion.  TTE 04/22/2017 Impressions: - No cardiac source of embolism was identified, but cannot be ruled out on the basis of this examination.  EF 60-65%.    PHYSICAL EXAM Pleasant elderly Caucasian male currently not in distress. . Afebrile. Head is nontraumatic. Neck is supple without bruit.    Cardiac exam no murmur or gallop. Lungs are clear to auscultation. Distal pulses are well felt.  Neurological Exam ;  Awake  Alert oriented x 3. Fluent speech and now able to understand and comprehend well. Follows two-step commands. Diminished attention registration and recall. No paraphasic errors. Able to name repeat..eye movements full without nystagmus.fundi were not visualized. Vision acuity appears normal. Decreased blink to threat on the right compared to the left and possibly partial. Field  cut. Hearing is normal. Palatal movements are normal. Face symmetric. Tongue midline. Normal strength, tone, reflexes and coordination. Normal sensation. Gait deferred.   ASSESSMENT/PLAN  Mr.Matthew C Wilsonis a 83 y.o.malewith history of  history of cerebral atherosclerosis, CAD, hyperlipidemia, hypertension, iliac artery aneurysm, MI, prior strokes (2003, 2013, 2016, 2017), TIA, and DM2 who presented to [facility] via EMS after sudden-onset RUE weakness and difficulty speaking and understanding language around 10:15 on 04/21/2017.  He received IV tPA at 1246 on 04/21/2017.  Left hemispheric TIA versus small MCA branch infarct not  identified on imaging status post IV TPA with good clinical recovery. Doubt complex partial seizure  Resultant resolving receptive aphasia right field cut and right arm weakness   CT head: no acute stroke  MRIhead: no acute stroke  MRAhead: no LVO; motion degraded exam  CTA head/neck: no LVO or high-grade stenosis  2D Echo: EF 60-65%. No source of embolus   LDL 109  HgbA1c5.7  SCDs for VTE prophylaxis  Diet NPO time specified  aspirin 81 mg daily and clopidogrel 75 mg dailyprior to admission, now on aspirin 81 mg daily and clopidogrel 75 mg daily  Patient counseled to be compliant with hisantithrombotic medications  Ongoing aggressive stroke risk factor management  Therapy recommendations:  pending  Disposition: Discharge to home/sefl-care  Hypertension  Stable  Permissive hypertension (OK if <220/120) but gradually normalize in 5-7 days  Long-term BP goal normotensive  Hyperlipidemia  Home meds: atorvastatin 40 mg PO daily  LDL 109, goal < 70  Increase atorvastatin dose to 80 mg PO daily  Continue statin at discharge  Diabetes  HgbA1c 5.7, goal < 7.0  Uncontrolled  Other Stroke Risk Factors  Advanced age  Hx stroke/TIA  Family hx stroke (father, sister)  Coronary artery disease  Other Active  Problems  None  Hospital day # 1  I have personally examined this patient, reviewed notes, independently viewed imaging studies, participated in medical decision making and plan of care.ROS completed by me personally and pertinent positives fully documented  I have made any additions or clarifications directly to the above note.  He presented with right upper extremity weakness and receptive language difficulties possibly due to left MCA branch infarct and received IV tPA and has made excellent recovery and brain imaging is negative for acute infarct. Doubt this represents seizures but will check an EEG. Interrogate loop recorder for paroxysmal A. fib. Strict blood pressure control as per post TPA protocol. Continue aspirin and Plavix. Long discussion the bedside with the patient and wife and answered questions. This patient is critically ill and at significant risk of neurological worsening, death and care requires constant monitoring of vital signs, hemodynamics,respiratory and cardiac monitoring, extensive review of multiple databases, frequent neurological assessment, discussion with family, other specialists and medical decision making of high complexity.I have made any additions or clarifications directly to the above note.This critical care time does not reflect procedure time, or teaching time or supervisory time of PA/NP/Med Resident etc but could involve care discussion time.  I spent 30 minutes of neurocritical care time  in the care of  this patient.      Antony Contras, MD Medical Director [place] Pager: [phone] 04/22/2017 1:30 PM   To contact Stroke Continuity provider, please refer to [email or web address] After hours, contact General Neurology

## 2017-04-22 NOTE — Progress Notes (Signed)
Bedside EEG completed, results pending. 

## 2017-04-22 NOTE — Evaluation (Signed)
Speech Language Pathology Evaluation Patient Details Name: [name] MRN: [id number] DOB: April 13, 1930 Today's Date: 04/22/2017 Time: 1740-8144 SLP Time Calculation (min) (ACUTE ONLY): 37 min  Problem List:  Patient Active Problem List   Diagnosis Date Noted  . CVA (cerebral vascular accident) ([place]) 04/21/2017  . PFO (patent foramen ovale) 12/30/2016  . Dyslipidemia 12/30/2016  . Cryptogenic stroke ([place])   . Numbness of tongue 12/13/2016  . Hyponatremia 12/13/2016  . GERD (gastroesophageal reflux disease) 02/25/2016  . Ischemic cerebrovascular accident (CVA) of frontal lobe ([place]) 12/15/2015  . Malnutrition of moderate degree 12/11/2015  . History of stroke 12/10/2015  . DM type 2 (diabetes mellitus, type 2) ([place]) 12/06/2015  . Dysarthria 12/05/2015  . CKD (chronic kidney disease) stage 3, GFR 30-59 ml/min 07/17/2015  . Cerebral infarction ([place]) 07/15/2015  . TIA (transient ischemic attack) 09/12/2013  . Metabolic syndrome 81/85/6314  . Vitamin D deficiency 08/01/2013  . PVD (peripheral vascular disease) ([place]) 11/25/2011  . ILIAC ARTERY ANEURYSM 07/17/2010  . Hyperlipidemia LDL goal <70 08/28/2009  . CORONARY ATHEROSCLEROSIS NATIVE CORONARY ARTERY 08/28/2009  . Essential hypertension 06/05/2009  . Cerebral atherosclerosis 06/04/2009   Past Medical History:  Past Medical History:  Diagnosis Date  . Arthritis    "wrists, knees, back" (12/10/2015)  . Asthma   . Cerebral atherosclerosis   . Chronic back pain    "lower primarily" (12/10/2015)  . CORONARY ATHEROSCLEROSIS NATIVE CORONARY ARTERY    Myocardial infarction at [facility] in 1996 with a directional atherectomy of the 90% LAD stenosis and medical management of 60-70% proximal circumflex and 50% RCA stenosis.  . DYSLIPIDEMIA   . GERD (gastroesophageal reflux disease)   . History of hiatal hernia   . HYPERTENSION   . ILIAC ARTERY ANEURYSM   . Myocardial infarction ([place]) 1996  . Stroke [facility]) 2003; 2013; 07/2015;  12/05/2015   denies residual on 12/10/2015, 12/13/16, 01/08/17  . TIA (transient ischemic attack)   . Type II diabetes mellitus ([place])   . UPPER RESPIRATORY INFECTION    Past Surgical History:  Past Surgical History:  Procedure Laterality Date  . ANAL FISSURE REPAIR  1990s  . BASAL CELL CARCINOMA EXCISION Right 12/2015   "temple"  . CATARACT EXTRACTION W/ INTRAOCULAR LENS  IMPLANT, BILATERAL Bilateral 2000s  . CORONARY ANGIOPLASTY  1996  . KNEE CARTILAGE SURGERY Right 1970s  . LOOP RECORDER INSERTION N/A 12/15/2016   Procedure: Loop Recorder Insertion;  Surgeon: Thompson Grayer, MD;  Location: [place] CV LAB;  Service: Cardiovascular;  Laterality: N/A;  . SHOULDER ARTHROSCOPY W/ ROTATOR CUFF REPAIR Left 2000s  . TEE WITHOUT CARDIOVERSION N/A 12/15/2016   Procedure: TRANSESOPHAGEAL ECHOCARDIOGRAM (TEE);  Surgeon: Lelon Perla, MD;  Location: [facility] ENDOSCOPY;  Service: Cardiovascular;  Laterality: N/A;   HPI:  72 year Caucasian male presents with sudden onset of receptive aphasia and right-sided peripheral vision loss with fluctuating exam with right hemiparesis which appears stable and improved. Likely left posterior division MCA branch infarct per MD, TPA given, pt developed angioedema in the tongue and left cheek.  MRI negative Patient has prior history of embolic right MCA branch infarcts in May 2018 and prior with resulting cognitive slowing per wife in prior admissions.     Assessment / Plan / Recommendation Clinical Impression  Pt demonstrates speech and language within normal limits; cognitive deficits indicative of age related decline that may have been exacerbated by previous stroke and current medical status. Pt exhibits decreased working memory and is slow to process information  when completing tasks. Pt completed visuospatial tasks on the [facility] with a score of 1/5; overall assessment score of 11/24 representative of a moderate to severe cognitive decline. Recommend f/u at outpatient SLP  if family desires. Pt has appropriate level of assist at home.    SLP Assessment  SLP Visit Diagnosis: Cognitive communication deficit (R41.841)    Follow Up Recommendations  None    Frequency and Duration           SLP Evaluation Cognition  Overall Cognitive Status: Impaired/Different from baseline Arousal/Alertness: Awake/alert Orientation Level: Oriented to person;Oriented to time;Oriented to situation Sustained Attention Impairment: Verbal complex;Functional complex Memory: Impaired Memory Impairment: Storage deficit;Retrieval deficit Awareness: Appears intact Safety/Judgment: Appears intact       Comprehension  Auditory Comprehension Overall Auditory Comprehension: Appears within functional limits for tasks assessed    Expression Expression Primary Mode of Expression: Verbal Verbal Expression Overall Verbal Expression: Appears within functional limits for tasks assessed Initiation: No impairment Pragmatics: No impairment   Oral / Motor  Oral Motor/Sensory Function Overall Oral Motor/Sensory Function: Mild impairment Lingual Symmetry:  (Mild deviation; pt had angiodema from TPA) Motor Speech Overall Motor Speech: Appears within functional limits for tasks assessed Respiration: Within functional limits Phonation: Normal Resonance: Within functional limits   GO                    Aaron Edelman, Student SLP 04/22/2017, 9:54 AM

## 2017-04-22 NOTE — Progress Notes (Signed)
Pt discharged. DC instructions given to pt. Pt states understanding of when to f/u with MD. We reviewed s/s stroke, and when to call 911. Pt was wheeled to car by RN.

## 2017-04-22 NOTE — Progress Notes (Signed)
  Echocardiogram 2D Echocardiogram has been performed.  [name] F 04/22/2017, 10:03 AM

## 2017-04-22 NOTE — Evaluation (Signed)
Speech Language Pathology Evaluation Patient Details Name: [name] MRN: [id number] DOB: 1930/05/25 Today's Date: 04/22/2017 Time: 1751-0258 SLP Time Calculation (min) (ACUTE ONLY): 37 min  Problem List:  Patient Active Problem List   Diagnosis Date Noted  . CVA (cerebral vascular accident) ([place]) 04/21/2017  . PFO (patent foramen ovale) 12/30/2016  . Dyslipidemia 12/30/2016  . Cryptogenic stroke ([place])   . Numbness of tongue 12/13/2016  . Hyponatremia 12/13/2016  . GERD (gastroesophageal reflux disease) 02/25/2016  . Ischemic cerebrovascular accident (CVA) of frontal lobe ([place]) 12/15/2015  . Malnutrition of moderate degree 12/11/2015  . History of stroke 12/10/2015  . DM type 2 (diabetes mellitus, type 2) ([place]) 12/06/2015  . Dysarthria 12/05/2015  . CKD (chronic kidney disease) stage 3, GFR 30-59 ml/min 07/17/2015  . Cerebral infarction ([place]) 07/15/2015  . TIA (transient ischemic attack) 09/12/2013  . Metabolic syndrome 52/77/8242  . Vitamin D deficiency 08/01/2013  . PVD (peripheral vascular disease) ([place]) 11/25/2011  . ILIAC ARTERY ANEURYSM 07/17/2010  . Hyperlipidemia LDL goal <70 08/28/2009  . CORONARY ATHEROSCLEROSIS NATIVE CORONARY ARTERY 08/28/2009  . Essential hypertension 06/05/2009  . Cerebral atherosclerosis 06/04/2009   Past Medical History:  Past Medical History:  Diagnosis Date  . Arthritis    "wrists, knees, back" (12/10/2015)  . Asthma   . Cerebral atherosclerosis   . Chronic back pain    "lower primarily" (12/10/2015)  . CORONARY ATHEROSCLEROSIS NATIVE CORONARY ARTERY    Myocardial infarction at [facility] in 1996 with a directional atherectomy of the 90% LAD stenosis and medical management of 60-70% proximal circumflex and 50% RCA stenosis.  . DYSLIPIDEMIA   . GERD (gastroesophageal reflux disease)   . History of hiatal hernia   . HYPERTENSION   . ILIAC ARTERY ANEURYSM   . Myocardial infarction ([place]) 1996  . Stroke [facility]) 2003; 2013; 07/2015;  12/05/2015   denies residual on 12/10/2015, 12/13/16, 01/08/17  . TIA (transient ischemic attack)   . Type II diabetes mellitus ([place])   . UPPER RESPIRATORY INFECTION    Past Surgical History:  Past Surgical History:  Procedure Laterality Date  . ANAL FISSURE REPAIR  1990s  . BASAL CELL CARCINOMA EXCISION Right 12/2015   "temple"  . CATARACT EXTRACTION W/ INTRAOCULAR LENS  IMPLANT, BILATERAL Bilateral 2000s  . CORONARY ANGIOPLASTY  1996  . KNEE CARTILAGE SURGERY Right 1970s  . LOOP RECORDER INSERTION N/A 12/15/2016   Procedure: Loop Recorder Insertion;  Surgeon: Thompson Grayer, MD;  Location: [place] CV LAB;  Service: Cardiovascular;  Laterality: N/A;  . SHOULDER ARTHROSCOPY W/ ROTATOR CUFF REPAIR Left 2000s  . TEE WITHOUT CARDIOVERSION N/A 12/15/2016   Procedure: TRANSESOPHAGEAL ECHOCARDIOGRAM (TEE);  Surgeon: Lelon Perla, MD;  Location: [facility] ENDOSCOPY;  Service: Cardiovascular;  Laterality: N/A;   HPI:  3 year Caucasian male presents with sudden onset of receptive aphasia and right-sided peripheral vision loss with fluctuating exam with right hemiparesis which appears stable and improved. Likely left posterior division MCA branch infarct per MD, TPA given, pt developed angioedema in the tongue and left cheek.  MRI negative Patient has prior history of embolic right MCA branch infarcts in May 2018 and prior with resulting cognitive slowing per wife in prior admissions.     Assessment / Plan / Recommendation Clinical Impression  Pt observed with thin liquid, puree and solid consistencies; no s/sx of aspiration noted. Recommend regular diet with thin liquid. No SLP f/u needed. Will sign off.     SLP Assessment  SLP Visit Diagnosis: Dysphagia,  unspecified (R13.10)    Follow Up Recommendations  None    Frequency and Duration           SLP Evaluation Cognition  Orientation Level: Oriented X4       Comprehension       Expression     Oral / Motor  Oral Motor/Sensory  Function Overall Oral Motor/Sensory Function: Mild impairment Lingual Symmetry: Abnormal symmetry right   GO                    Aaron Edelman, Student SLP 04/22/2017, 9:27 AM

## 2017-04-22 NOTE — Progress Notes (Signed)
OT Cancellation Note  Patient Details Name: SHUAIB CORSINO MRN: [id number] DOB: 1930-05-12   Cancelled Treatment:    Reason Eval/Treat Not Completed: Patient not medically ready (active bedrest orders).  Binnie Kand M.S., OTR/L Pager: [phone]  04/22/2017, 7:02 AM

## 2017-04-22 NOTE — Discharge Summary (Signed)
Stroke Discharge Summary  Patient ID: [name]   MRN: [id number]      DOB: October 02, 1929  Date of Admission: 04/21/2017 Date of Discharge: 04/22/2017  Attending Physician:  Garvin Fila, MD, Stroke MD Consultant(s):    neurology Patient's PCP:  Timmothy Euler, MD  DISCHARGE DIAGNOSIS: Active Problems:   Left hemispheric TIA versus small left MCA branch infarct not visualized on MRI treated with IV TPA with excellent clinical recovery.   Acute encephalopathy   Past Medical History:  Diagnosis Date  . Arthritis    "wrists, knees, back" (12/10/2015)  . Asthma   . Cerebral atherosclerosis   . Chronic back pain    "lower primarily" (12/10/2015)  . CORONARY ATHEROSCLEROSIS NATIVE CORONARY ARTERY    Myocardial infarction at [facility] in 1996 with a directional atherectomy of the 90% LAD stenosis and medical management of 60-70% proximal circumflex and 50% RCA stenosis.  . DYSLIPIDEMIA   . GERD (gastroesophageal reflux disease)   . History of hiatal hernia   . HYPERTENSION   . ILIAC ARTERY ANEURYSM   . Myocardial infarction ([place]) 1996  . Stroke [facility]) 2003; 2013; 07/2015; 12/05/2015   denies residual on 12/10/2015, 12/13/16, 01/08/17  . TIA (transient ischemic attack)   . Type II diabetes mellitus ([place])   . UPPER RESPIRATORY INFECTION    Past Surgical History:  Procedure Laterality Date  . ANAL FISSURE REPAIR  1990s  . BASAL CELL CARCINOMA EXCISION Right 12/2015   "temple"  . CATARACT EXTRACTION W/ INTRAOCULAR LENS  IMPLANT, BILATERAL Bilateral 2000s  . CORONARY ANGIOPLASTY  1996  . KNEE CARTILAGE SURGERY Right 1970s  . LOOP RECORDER INSERTION N/A 12/15/2016   Procedure: Loop Recorder Insertion;  Surgeon: Thompson Grayer, MD;  Location: [place] CV LAB;  Service: Cardiovascular;  Laterality: N/A;  . SHOULDER ARTHROSCOPY W/ ROTATOR CUFF REPAIR Left 2000s  . TEE WITHOUT CARDIOVERSION N/A 12/15/2016   Procedure: TRANSESOPHAGEAL ECHOCARDIOGRAM (TEE);  Surgeon: Lelon Perla, MD;  Location: [facility] END[facility];  Service: Cardiovascular;  Laterality: N/A;    Allergies as of 04/22/2017      Reactions   Alteplase Anaphylaxis   Codeine Other (See Comments)   Bad dreams   Lopid [gemfibrozil] Nausea Only   Voltaren [diclofenac Sodium] Diarrhea          LABORATORY STUDIES CBC    Component Value Date/Time   WBC 6.7 04/21/2017 1127   RBC 4.32 04/21/2017 1127   HGB 14.3 04/21/2017 1133   HGB 15.4 04/28/2016 1004   HCT 42.0 04/21/2017 1133   HCT 43.1 04/28/2016 1004   PLT 175 04/21/2017 1127   PLT 211 04/28/2016 1004   MCV 93.8 04/21/2017 1127   MCV 94 04/28/2016 1004   MCH 32.9 04/21/2017 1127   MCHC 35.1 04/21/2017 1127   RDW 12.6 04/21/2017 1127   RDW 12.0 (L) 04/28/2016 1004   LYMPHSABS 1.7 04/21/2017 1127   LYMPHSABS 1.8 04/28/2016 1004   MONOABS 0.6 04/21/2017 1127   EOSABS 0.2 04/21/2017 1127   EOSABS 0.4 04/28/2016 1004   BASOSABS 0.0 04/21/2017 1127   BASOSABS 0.0 04/28/2016 1004   CMP    Component Value Date/Time   NA 133 (L) 04/21/2017 1133   NA 136 04/12/2017 1027   K 3.7 04/21/2017 1133   CL 93 (L) 04/21/2017 1133   CO2 24 04/21/2017 1127   GLUCOSE 155 (H) 04/21/2017 1133   BUN 12 04/21/2017 1133   BUN 9 04/12/2017 1027  CREATININE 1.10 04/21/2017 1133   CREATININE 1.27 02/02/2013 0853   CALCIUM 9.2 04/21/2017 1127   PROT 6.9 04/21/2017 1127   PROT 7.0 11/05/2016 0845   ALBUMIN 4.3 04/21/2017 1127   ALBUMIN 4.6 11/05/2016 0845   AST 23 04/21/2017 1127   ALT 18 04/21/2017 1127   ALKPHOS 66 04/21/2017 1127   BILITOT 1.3 (H) 04/21/2017 1127   BILITOT 0.6 11/05/2016 0845   GFRNONAA 56 (L) 04/21/2017 1127   GFRNONAA 53 (L) 02/02/2013 0853   GFRAA >60 04/21/2017 1127   GFRAA 61 02/02/2013 0853   COAGS Lab Results  Component Value Date   INR 1.01 04/21/2017   INR 1.01 01/08/2017   INR 1.03 12/13/2016   Lipid Panel    Component Value Date/Time   CHOL 109 04/22/2017 0158   CHOL 113 11/05/2016 0845   CHOL 132  02/02/2013 0853   TRIG 62 04/22/2017 0158   TRIG 123 02/08/2015 1009   TRIG 172 (H) 02/02/2013 0853   HDL 41 04/22/2017 0158   HDL 41 11/05/2016 0845   HDL 41 02/08/2015 1009   HDL 33 (L) 02/02/2013 0853   CHOLHDL 2.7 04/22/2017 0158   VLDL 12 04/22/2017 0158   LDLCALC 56 04/22/2017 0158   LDLCALC 49 11/05/2016 0845   LDLCALC 49 05/02/2014 0934   LDLCALC 65 02/02/2013 0853   HgbA1C  Lab Results  Component Value Date   HGBA1C 5.7 (H) 04/22/2017   Urinalysis    Component Value Date/Time   COLORURINE YELLOW 12/13/2016 2045   APPEARANCEUR CLEAR 12/13/2016 2045   LABSPEC 1.011 12/13/2016 2045   PHURINE 6.0 12/13/2016 2045   GLUCOSEU NEGATIVE 12/13/2016 2045   HGBUR NEGATIVE 12/13/2016 2045   BILIRUBINUR NEGATIVE 12/13/2016 2045   BILIRUBINUR neg 05/02/2014 0956   KETONESUR NEGATIVE 12/13/2016 2045   PROTEINUR NEGATIVE 12/13/2016 2045   UROBILINOGEN negative 05/02/2014 0956   NITRITE NEGATIVE 12/13/2016 2045   LEUKOCYTESUR NEGATIVE 12/13/2016 2045   Urine Drug Screen     Component Value Date/Time   LABOPIA NONE DETECTED 12/13/2016 2045   COCAINSCRNUR NONE DETECTED 12/13/2016 2045   LABBENZ NONE DETECTED 12/13/2016 2045   AMPHETMU NONE DETECTED 12/13/2016 2045   THCU NONE DETECTED 12/13/2016 2045   LABBARB NONE DETECTED 12/13/2016 2045    Alcohol Level    Component Value Date/Time   ETH <5 12/13/2016 1732    SIGNIFICANT DIAGNOSTIC STUDIES CT Head Wo Contrast 04/21/2017 IMPRESSION: 1. Atrophy and small vessel disease. No acute intracranial findings. 2. ASPECTS is 10.  CTA Head/Neck 04/21/2017 IMPRESSION: No flow reducing extracranial stenosis. Heavily calcified carotid bifurcations bilaterally without significant luminal narrowing.  No intracranial large vessel occlusion or significant skull base ICA narrowing.  BILATERAL distal vertebral artery stenoses, greater on the RIGHT. MRI Brain Wo Contrast MRA Brain Wo Contrast 04/21/2017 IMPRESSION: 1. No acute  intracranial abnormality. 2. White matter changes suggesting chronic ischemic microangiopathy. 3. Motion degraded MRA examination without large vessel occlusion.  TTE 04/22/2017 Impressions: - No cardiac source of embolism was identified, but cannot be ruled out on the basis of this examination.  EF 60-65%.    HISTORY OF PRESENT ILLNESS Mr. Pinzon is a 23 year Caucasian male with a history of cerebral atherosclerosis, CAD, hyperlipidemia, hypertension, iliac artery aneurysm, MI, prior strokes (2003, 2013, 2016, 2017), TIA, and DM2 who presented to [facility] via EMS after sudden-onset RUE weakness and difficulty speaking and understanding language around 10:15 on 04/21/2017.  He is known to me from prior admission for stroke in office  visit. History is obtained from the patient's wife is available at the bedside as well as from EMS and review of electronic medical records.  On scene, EMS noted initial improvement in his right-sided weakness as well as speech to some extent but by the time they reached [facility] the patient's speech had again gotten significantly worse but he did not have any recurrent right-sided weakness. Admit the patient upon arrival and CT scan of the head was obtained which showed no acute abnormalities. Patient blood pressure significantly elevated upon admission but this was controlled with IV nicardipine drip and he was given IV TPA with daughter negative time being 31 minutes. There was some delay secondary to starting an additional IV for emergentCT angiogram was ordered. CT angiogram personally reviewed shows no large vessel occlusion. Patient tolerated TPA infusion well but after the infusion was completed developed the significant swelling of the tongue as well as left side of the cheek. This was felt to be angioedema and he was given IV Solu-Medrol and Benadryl. She did not appear to be in respiratory distress. The patient apparently had been doing well until prior to his  onset of stroke symptoms. He had denied any headache, vomiting and loss of vision. He had a prior history of embolic right MCA branch infarcts in May 2018 for which he was hospitalized at [facility]. He had extensive evaluation including TEE as well as loop recorder insertion. He was seen in the office a few weeks ago and had been doing well he was on aspirin and Plavix. Patient had not had any recent surgeries. He had no known prior allergy to tPA.  Last seen normal; 10:15 on 04/21/17.   Baseline MRS 1  HOSPITAL COURSE Mr. DINNIS ROG is a 83 y.o. male with history of  history of cerebral atherosclerosis, CAD, hyperlipidemia, hypertension, iliac artery aneurysm, MI, prior strokes (2003, 2013, 2016, 2017), TIA, and DM2 who presented to [facility] via EMS after sudden-onset RUE weakness and difficulty speaking and understanding language around 10:15 on 04/21/2017.  He received IV tPA at 1246 on 04/21/2017. He was admitted to the neurological intensive care unit where blood pressure was tightly controlled. He showed improvement in his speech and vision difficulties but he did develop angioedema involving left side of his tongue and cheek with swelling. It was unclear whether this was related to IV tPA are IV Celebrex to. TPA infusion was already completed and Relpax was discontinued. He was treated with IV Solu-Medrol and Benadryl and showed improvement in his tongue swelling and cheek swelling.  TIA: No acute stroke identified on imaging  Resultant   receptive aphasia and right field cut improved.  CT head: no acute stroke  MRI head: no acute stroke  MRA head: no LVO; motion degraded exam  CTA head/neck: no LVO or high-grade stenosis  2D Echo: EF 60-65%. No source of embolus   LDL 109  HgbA1c 5.7  SCDs for VTE prophylaxis  Diet NPO time specified  aspirin 81 mg daily and clopidogrel 75 mg daily prior to admission, now on aspirin 81 mg daily and clopidogrel 75 mg daily  Patient counseled to  be compliant with his antithrombotic medications  Ongoing aggressive stroke risk factor management  Therapy recommendations:   none  Disposition: Discharge to home/sefl-care  Hypertension  Stable  Permissive hypertension (OK if < 220/120) but gradually normalize in 5-7 days  Long-term BP goal normotensive  Hyperlipidemia  Home meds: atorvastatin 40 mg PO daily  LDL  109, goal < 70  Increase atorvastatin dose to 80 mg PO daily  Continue statin at discharge  Diabetes  HgbA1c 5.7, goal < 7.0  Uncontrolled  Other Stroke Risk Factors  Advanced age  Hx stroke/TIA  Family hx stroke (father, sister)  Coronary artery disease  Other Active Problems  None  DISCHARGE EXAM Blood pressure (!) 126/59, pulse 64, temperature 98.1 F (36.7 C), temperature source Oral, resp. rate 15, height 5\' 11"  (1.803 m), weight 89 kg (196 lb 3.4 oz), SpO2 93 %.  Pleasant Caucasian male not in distress. . Afebrile. Head is nontraumatic. Neck is supple without bruit.    Cardiac exam no murmur or gallop. Lungs are clear to auscultation. Distal pulses are well felt. Neurological Exam : Awake alert oriented 2. Fluent speech. Diminished attention and recall. No paraphasic errors. Good naming and repetition. Extraocular movements full range without nystagmus. Face is symmetric without weakness. Tongue midline. Motor system exam no drift. Symmetric and equal strength in all 4 extremities. No focal weakness. Sensation intact. Gait not tested. Discharge Diet   Diet regular Room service appropriate? Yes; Fluid consistency: Thin liquids  DISCHARGE PLAN  Disposition: Discharge to home/self-care  aspirin 81 mg daily and clopidogrel 75 mg daily for secondary stroke prevention.  Ongoing risk factor control by Primary Care Physician at time of discharge  Follow-up Timmothy Euler, MD in 2 weeks.  Keep scheduled follow-up with Dr. Antony Contras, Stroke Clinic  office to schedule an  appointment.  35 minutes were spent preparing discharge.  I have personally examined this patient, reviewed notes, independently viewed imaging studies, participated in medical decision making and plan of care.ROS completed by me personally and pertinent positives fully documented  I have made any additions or clarifications directly to the above note. Agree with note above.   Antony Contras, MD Medical Director [facility] Pager: [phone] 04/22/2017 3:36 PM

## 2017-05-03 ENCOUNTER — Other Ambulatory Visit: Payer: Self-pay | Admitting: *Deleted

## 2017-05-03 NOTE — Patient Outreach (Signed)
[facility]) Care Management  05/03/2017  TENN[place] PERRA 08/18/1929 [id number]  Referral via EMMI-Stroke Red Alert on Day#9 05/02/2017: Reason: Lost on interest in things they use to do-yes Sad, hopeless, anxious, empty-Yes  Per hx patient was taken to ED with symptoms of RUE weakness & difficulty speaking & understanding language on 04/21/17. Patient with hx cerebral atherosclerosis, CAD, TIA, DM2, MI, iliac artery aneurysm, prior strokes. Was treated in ED & admitted to hospital.   Telephone call to patient; spouse took call & advised that patient was not available but she would have him return my call.   1400 Received return call from patient & he was advised of reason for call.  HIPPA verification received from patient.   Patient voices that he has answered no to both questions & that machine kept saying "yes".  States he is not sad & has not lost interest in doing things.   States he is 90% better since his initial admission to hospital. States his speech is clear & he has no problems understanding  others when they speak to him.  States he is currently using a cane as a means of prevention in case he has balance problem. States he has not had any falls.    Voices that he has all of his medications & is taking as prescribed by his MD. Voices understanding of importance of taking medications as prescribed. States he is aware of symptoms of stroke & will call or have spouse call 911 if symptoms occur. States he plans to call to get hospital follow up appointment with primary care provider & already has appointment with neurologist. States no problems with transportation.   Patient voices no further health concerns. EMMI call addressed.  Plan: Send to Care Management Assistant to close case.   Sherrin Daisy, RN BSN [place] Management Coordinator [facility] Care Management  [phone]

## 2017-05-06 ENCOUNTER — Other Ambulatory Visit: Payer: Self-pay | Admitting: Dermatology

## 2017-05-06 DIAGNOSIS — C44319 Basal cell carcinoma of skin of other parts of face: Secondary | ICD-10-CM | POA: Diagnosis not present

## 2017-05-06 DIAGNOSIS — D492 Neoplasm of unspecified behavior of bone, soft tissue, and skin: Secondary | ICD-10-CM | POA: Diagnosis not present

## 2017-05-06 DIAGNOSIS — C44329 Squamous cell carcinoma of skin of other parts of face: Secondary | ICD-10-CM | POA: Diagnosis not present

## 2017-05-06 DIAGNOSIS — D0439 Carcinoma in situ of skin of other parts of face: Secondary | ICD-10-CM | POA: Diagnosis not present

## 2017-05-06 DIAGNOSIS — L570 Actinic keratosis: Secondary | ICD-10-CM | POA: Diagnosis not present

## 2017-05-10 NOTE — Progress Notes (Signed)
[facility]  PATIENT: [name] DOB: [birth year]   REASON FOR VISIT: hospital follow up for left hemispheric TIA versus small MCA branch infarctSeptember 2018 not identified on imaging, IV TPA with good clinical recovery previous stroke right MCA infarcts in may 2018,  HISTORY FROM:patient and wife    HISTORY OF PRESENT ILLNESS:FROM RECORD Matthew C Wilsonis a 83 y.o.malewho reports that he experienced transient left tongue numbness. His family who were with him noticed that his left face drooped and his speech became thick. This has happened twice today. It is currently completely back to baseline. It happened also last Thursday. He has a history of multiple strokes, but has not had one since at least last a year. He is currently at baseline. Patient was not administered IV t-PA secondary to resolution of symptoms. He was admitted for further evaluation and treatment.He presented with transient numbness of the left border of his tongue and facial droop and slurred speech which is improving. He has history of multiple strokes in the past which were felt to be small vessel disease and he has been on aspirin, Aggrenox, aspirin and Plavix and more recently Plavix alone.12/14/16  MRI of the brain with several small right MCA infarcts, small vessel disease and atrophy. MRA no large vessel occlusion. 2-D echo 60-65% no source of embolus. LDL 49 hemoglobin A1c 6.3. Loop recorder placed. Interval history: 07/11/2018[name], 83 year old male returns for hospital follow-up. He remains on Plavix and aspirin for secondary stroke prevention. He has minimal bruising or bleeding He had an ER visit on 01/08/2017 for numbness on the right side of the face and the right hand. No changes were made in medication regimen  he was not admitted. His symptoms have resolved by the time  he was evaluated. He remains on Lipitor for hyperlipidemia without complaints of myalgias. Blood pressure in the  office today 151/81. Loop recorder has been placed that so far no evidence of arrhythmias. He returns for reevaluation  Patient was initially evaluated after a stroke in December 2016. He was readmitted on May 2017 with recurrent dysarthria and MRI showed a small left frontal lacunar infarct  PER RECORDMr. Ortega is a 70 year Caucasian male with a history of cerebral atherosclerosis, CAD, hyperlipidemia, hypertension, iliac artery aneurysm, MI, prior strokes (2003, 2013, 2016, 2017), TIA, and DM2 who presented to [facility] via EMS after sudden-onset RUE weakness and difficulty speaking and understanding language around 10:15 on 04/21/2017.  He is known to me from prior admission for stroke in office visit. History is obtained from the patient's wife is available at the bedside as well as from EMS and review of electronic medical records.  On scene, EMS noted initial improvement in his right-sided weakness as well as speech to some extent but by the time they reached [facility] the patient's speech had again gotten significantly worse but he did not have any recurrent right-sided weakness. Admit the patient upon arrival and CT scan of the head was obtained which showed no acute abnormalities. Patient blood pressure significantly elevated upon admission but this was controlled with IV nicardipine drip and he was given IV TPA with daughter negative time being 31 minutes. There was some delay secondary to starting an additional IV for emergentCT angiogram was ordered. CT angiogram personally reviewed shows no large vessel occlusion. Patient tolerated TPA infusion well but after the infusion was completed developed the significant swelling of the tongue as well as left side of the cheek.  This was felt to be angioedema and he was given IV Solu-Medrol and Benadryl. He did not appear to be in respiratory distress. The patient apparently had been doing well until prior to his onset of stroke symptoms. He had denied  any headache, vomiting and loss of vision. He had a prior history of embolic right MCA branch infarcts in May 2018 for which he was hospitalized at [facility]. He had extensive evaluation including TEE as well as loop recorder insertion. He was seen in the office a few weeks ago and had been doing well he was on aspirin and Plavix. Patient had not had any recent surgeries. He had no known prior allergy to TPA. Last seen normal; ; 10 :15 On 04/21/17 IV TPA given ; Yes baseline MRS 1 He received IV tPA at 1246 on 04/21/2017.  He was admitted to the neuro ICU for further evaluation and treatment. His wife is at the bedside.  The patient is awake, alert, and follows all commands appropriately.  His speech seems to have improved back to normal and right-sided peripheral vision loss is also improving. Blood pressure has been adequately controlled. No new complaints. Tongue swelling has resolved Interval history 05/11/17 Matthew [name], 83 year old male returns for hospital follow-up after left hemispheric TIA versus small MCA branch infarct not  identified on imaging on 04/21/2017. MRA no large vessel occlusion. CTA head and neck no large vessel or high-grade stenosis. 2-D echo EF 60-65% no source of embolus. LDL 109 hemoglobin A1c 5.7 remains on aspirin and Plavix for secondary stroke prevention without further stroke or TIA symptoms. He has minor bruising and no bleeding.His atorvastatin was increased to 80 mg daily. He denies myalgias.  Blood pressure in the office today 140/80.he returns for reevaluation. Loop recorder without evidence of arrhythmias. REVIEW OF SYSTEMS: Full 14 system review of systems performed and notable only for those listed, all others are neg:  Constitutional: neg  Cardiovascular: neg Ear/Nose/Throat: neg  Skin: neg Eyes: neg Respiratory: neg Gastroitestinal: urinary frequency Hematology/Lymphatic: neg  Endocrine: neg Musculoskeletal Right knee pain llergy/Immunology:  neg Neurological: neg Psychiatric: neg Sleep : neg   ALLERGIES: Allergies  Allergen Reactions  . Alteplase Anaphylaxis  . Codeine Other (See Comments)    Bad dreams  . Lopid [Gemfibrozil] Nausea Only  . Voltaren [Diclofenac Sodium] Diarrhea    HOME MEDICATIONS: Outpatient Medications Prior to Visit  Medication Sig Dispense Refill  . aspirin EC 81 MG tablet Take 1 tablet (81 mg total) by mouth daily. 150 tablet 2  . atorvastatin (LIPITOR) 80 MG tablet Take 1 tablet (80 mg total) by mouth daily at 6 PM. 30 tablet 1  . BAYER CONTOUR TEST test strip USE ONE STRIP TO CHECK GLUCOSE TWICE DAILY AND  AS  NEEDED 100 each 11  . benazepril (LOTENSIN) 20 MG tablet TAKE 1 TABLET BY MOUTH ONCE DAILY 90 tablet 1  . Cholecalciferol (VITAMIN D) 2000 units CAPS Take 2,000 Units by mouth daily.    . clopidogrel (PLAVIX) 75 MG tablet TAKE ONE TABLET BY MOUTH ONCE DAILY 90 tablet 3  . feeding supplement, GLUCERNA SHAKE, (GLUCERNA SHAKE) LIQD Take 237 mLs by mouth 2 (two) times daily between meals. 60 Can 11  . fish oil-omega-3 fatty acids 1000 MG capsule Take 2 g by mouth every evening.     . Fluticasone-Salmeterol (ADVAIR DISKUS) 100-50 MCG/DOSE AEPB INHALE ONE DOSE BY MOUTH EVERY 12 HOURS 180 each 2  . hydrochlorothiazide (HYDRODIURIL) 25 MG tablet TAKE ONE-HALF TABLET  BY MOUTH ONCE DAILY EXCEPT  ON  MONDAY,  WEDNESDAY,  AND  FRIDAY  TAKE  A  WHOLE  TABLET 90 tablet 0  . metFORMIN (GLUCOPHAGE-XR) 500 MG 24 hr tablet TAKE ONE TABLET BY MOUTH TWICE DAILY 180 tablet 0  . metoprolol tartrate (LOPRESSOR) 100 MG tablet TAKE ONE TABLET BY MOUTH TWICE DAILY 180 tablet 0  . nitroGLYCERIN (NITROSTAT) 0.4 MG SL tablet Place 0.4 mg under the tongue every 5 (five) minutes as needed for chest pain.    . pantoprazole (PROTONIX) 40 MG tablet TAKE ONE TABLET BY MOUTH ONCE DAILY 30 tablet 11  . PROAIR HFA 108 (90 Base) MCG/ACT inhaler INHALE 2 PUFFS BY MOUTH 4 TIMES DAILY AS NEEDED 9 each 2  . hydrALAZINE (APRESOLINE)  25 MG tablet Take 1 tablet (25 mg total) by mouth daily as needed. 90 tablet 3  . atorvastatin (LIPITOR) 40 MG tablet Take 1 tablet (40 mg total) by mouth daily. (Patient not taking: Reported on 05/11/2017) 90 tablet 0  . atorvastatin (LIPITOR) 40 MG tablet Take 1 tablet (40 mg total) by mouth daily. (Patient not taking: Reported on 05/11/2017) 30 tablet 1   No facility-administered medications prior to visit.     PAST MEDICAL HISTORY: Past Medical History:  Diagnosis Date  . Arthritis    "wrists, knees, back" (12/10/2015)  . Asthma   . Cerebral atherosclerosis   . Chronic back pain    "lower primarily" (12/10/2015)  . CORONARY ATHEROSCLEROSIS NATIVE CORONARY ARTERY    Myocardial infarction at [facility] in 1996 with a directional atherectomy of the 90% LAD stenosis and medical management of 60-70% proximal circumflex and 50% RCA stenosis.  . DYSLIPIDEMIA   . GERD (gastroesophageal reflux disease)   . History of hiatal hernia   . HYPERTENSION   . ILIAC ARTERY ANEURYSM   . Myocardial infarction ([place]) 1996  . Stroke [facility]) 2003; 2013; 07/2015; 12/05/2015   denies residual on 12/10/2015, 12/13/16, 01/08/17  . TIA (transient ischemic attack)   . Type II diabetes mellitus ([place])   . UPPER RESPIRATORY INFECTION     PAST SURGICAL HISTORY: Past Surgical History:  Procedure Laterality Date  . ANAL FISSURE REPAIR  1990s  . BASAL CELL CARCINOMA EXCISION Right 12/2015   "temple"  . CATARACT EXTRACTION W/ INTRAOCULAR LENS  IMPLANT, BILATERAL Bilateral 2000s  . CORONARY ANGIOPLASTY  1996  . KNEE CARTILAGE SURGERY Right 1970s  . LOOP RECORDER INSERTION N/A 12/15/2016   Procedure: Loop Recorder Insertion;  Surgeon: Thompson Grayer, MD;  Location: [place] [facility];  Service: Cardiovascular;  Laterality: N/A;  . SHOULDER ARTHROSCOPY W/ ROTATOR CUFF REPAIR Left 2000s  . TEE WITHOUT CARDIOVERSION N/A 12/15/2016   Procedure: TRANSESOPHAGEAL ECHOCARDIOGRAM (TEE);  Surgeon: Lelon Perla, MD;  Location: [facility]  ENDOSCOPY;  Service: Cardiovascular;  Laterality: N/A;    FAMILY HISTORY: Family History  Problem Relation Age of Onset  . Coronary artery disease Mother   . Cancer Father        lung  . Stroke Father   . Cancer Brother        lung  . Stroke Sister     SOCIAL HISTORY: Social History   Social History  . Marital status: Married    Spouse name: N/A  . Number of children: N/A  . Years of education: N/A   Occupational History  . 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 Smoking status: Former Smoker  Packs/day: 0.12    Years: 45.00    Types: Cigarettes    Quit date: 11/24/1984  . Smokeless tobacco: Former Systems developer    Types: Snuff     Comment: "dipped snuff when I was a boy"  . Alcohol use No  . Drug use: No  . Sexual activity: No   Other Topics Concern  . Not on file   Social History Narrative  . No narrative on file     PHYSICAL EXAM  Vitals:   05/11/17 1022  BP: (!) 140/80   Pulse: 60  Weight: 190 lb 9.6 oz (86.5 kg)  Height: 5\' 11"  (1.803 m)   Body mass index is 26.58 kg/m.  Generalized: Well developed, in no acute distress  Head: normocephalic and atraumatic,. Oropharynx benign  Neck: Supple, no carotid bruits  Cardiac: Regular rate rhythm, no murmur  Musculoskeletal: No deformity   Neurological examination   Mentation: Alert oriented to time, place, history taking. Attention span and concentration appropriate. Recent and remote memory intact.  Follows all commands speech and language fluent.   Cranial nerve II-XII: Pupils were equal round reactive to light extraocular movements were full, left field cut,.  mild left lower facial asymmetry, hearing was intact to finger rubbing bilaterally. Uvula tongue midline. head turning and shoulder shrug were normal and symmetric.Tongue protrusion into cheek strength was normal. Motor: normal bulk and tone, full strength in the BUE, BLE, fine finger movements normal, no pronator drift. No focal  weakness Sensory: normal and symmetric to light touch in the upper and lower extremities,  Coordination: finger-nose-finger, heel-to-shin bilaterally, no dysmetria, no tremor Reflexes:  symmetric upper and lower, plantar responses were flexor bilaterally. Gait and Station: Rising up from seated position without assistance, normal stance,  moderate stride, good arm swing, smooth turning, able to perform tiptoe, and heel walking without difficulty. Patient has a limp on the right due to knee pain   DIAGNOSTIC DATA (LABS, IMAGING, TESTING) - I reviewed patient records, labs, notes, testing and imaging myself where available.  Lab Results  Component Value Date   WBC 6.7 04/21/2017   HGB 14.3 04/21/2017   HCT 42.0 04/21/2017   MCV 93.8 04/21/2017   PLT 175 04/21/2017      Component Value Date/Time   NA 133 (L) 04/21/2017 1133   NA 136 04/12/2017 1027   K 3.7 04/21/2017 1133   CL 93 (L) 04/21/2017 1133   CO2 24 04/21/2017 1127   GLUCOSE 155 (H) 04/21/2017 1133   BUN 12 04/21/2017 1133   BUN 9 04/12/2017 1027   CREATININE 1.10 04/21/2017 1133   CREATININE 1.27 02/02/2013 0853   CALCIUM 9.2 04/21/2017 1127   PROT 6.9 04/21/2017 1127   PROT 7.0 11/05/2016 0845   ALBUMIN 4.3 04/21/2017 1127   ALBUMIN 4.6 11/05/2016 0845   AST 23 04/21/2017 1127   ALT 18 04/21/2017 1127   ALKPHOS 66 04/21/2017 1127   BILITOT 1.3 (H) 04/21/2017 1127   BILITOT 0.6 11/05/2016 0845   GFRNONAA 56 (L) 04/21/2017 1127   GFRNONAA 53 (L) 02/02/2013 0853   GFRAA >60 04/21/2017 1127   GFRAA 61 02/02/2013 0853   Lab Results  Component Value Date   CHOL 109 04/22/2017   HDL 41 04/22/2017   LDLCALC 56 04/22/2017   TRIG 62 04/22/2017   CHOLHDL 2.7 04/22/2017   Lab Results  Component Value Date   HGBA1C 5.7 (H) 04/22/2017    ASSESSMENT AND PLAN  83 y.o. year old male  has a  past medical history of Arthritis;  Cerebral atherosclerosis; CORONARY ATHEROSCLEROSIS NATIVE CORONARY ARTERY; DYSLIPIDEMIA;   HYPERTENSION; ILIAC ARTERY ANEURYSM; Myocardial infarction ([place]) (1996); Stroke [facility]) (2003; 2013; 07/2015; 12/05/2015); TIA (transient ischemic attack); Type II diabetes mellitus ([place]);  Here for hospital follow-up for right MCA infarcts in May 2018 and .left hemispheric TIA versus small MCA branch infarctSeptember 2018 not identified on imaging  Stressed the importance of management of risk factors to prevent further stroke Continue Plavix and Aspirin for secondary stroke prevention Maintain strict control of hypertension with blood pressure goal below 130/90, today's reading 140/80 continue antihypertensive medications Control of diabetes with hemoglobin A1c below 6.5 followed by primary care most recent hemoglobin A1c 5.7 continue diabetic medications Cholesterol with LDL cholesterol less than 70, followed by primary care,  most recent 109   continue Lipitor will refill Exercise by walking, slowly increase , use cane for safe ambulation eat healthy diet with whole grains,  fresh fruits and vegetables Loop recorder so far without arrhythmias Follow up in 4 months Discussed risk for recurrent stroke/ TIA and answered additional questions This was a  visit requiring 25 minutes and medical decision making of high complexity with extensive review of history, hospital chart, counseling and answering questions Dennie Bible, [facility], [facility], APRN  [facility] [address], [place], [place] [address] [phone]

## 2017-05-11 ENCOUNTER — Ambulatory Visit (INDEPENDENT_AMBULATORY_CARE_PROVIDER_SITE_OTHER): Payer: Medicare Other | Admitting: Nurse Practitioner

## 2017-05-11 ENCOUNTER — Encounter: Payer: Self-pay | Admitting: Nurse Practitioner

## 2017-05-11 VITALS — BP 140/80 | HR 60 | Ht 71.0 in | Wt 190.6 lb

## 2017-05-11 DIAGNOSIS — Z8673 Personal history of transient ischemic attack (TIA), and cerebral infarction without residual deficits: Secondary | ICD-10-CM | POA: Diagnosis not present

## 2017-05-11 DIAGNOSIS — I10 Essential (primary) hypertension: Secondary | ICD-10-CM

## 2017-05-11 DIAGNOSIS — E785 Hyperlipidemia, unspecified: Secondary | ICD-10-CM

## 2017-05-11 DIAGNOSIS — I6300 Cerebral infarction due to thrombosis of unspecified precerebral artery: Secondary | ICD-10-CM

## 2017-05-11 MED ORDER — ATORVASTATIN CALCIUM 80 MG PO TABS: 80.0000 mg | ORAL_TABLET | Freq: Every day | ORAL | 4 refills | Status: DC

## 2017-05-11 NOTE — Patient Instructions (Signed)
Stressed the importance of management of risk factors to prevent further stroke Continue Plavix and Aspirin for secondary stroke prevention Maintain strict control of hypertension with blood pressure goal below 130/90, today's reading 140/80 continue antihypertensive medications Control of diabetes with hemoglobin A1c below 6.5 followed by primary care most recent hemoglobin A1c 5.7 continue diabetic medications Cholesterol with LDL cholesterol less than 70, followed by primary care,  most recent 109   continue Lipitor will refill Exercise by walking, slowly increase , use cane for safe ambulation eat healthy diet with whole grains,  fresh fruits and vegetables Loop recorder so far without arrhythmias Follow up in 4 months

## 2017-05-14 ENCOUNTER — Ambulatory Visit (INDEPENDENT_AMBULATORY_CARE_PROVIDER_SITE_OTHER): Payer: Medicare Other | Admitting: *Deleted

## 2017-05-14 DIAGNOSIS — I639 Cerebral infarction, unspecified: Secondary | ICD-10-CM

## 2017-05-14 NOTE — Progress Notes (Signed)
I agree with the above plan 

## 2017-05-17 NOTE — Progress Notes (Signed)
Carelink Summary Report / Loop Recorder 

## 2017-05-18 ENCOUNTER — Encounter: Payer: Self-pay | Admitting: Family Medicine

## 2017-05-18 ENCOUNTER — Ambulatory Visit (INDEPENDENT_AMBULATORY_CARE_PROVIDER_SITE_OTHER): Payer: Medicare Other | Admitting: Family Medicine

## 2017-05-18 VITALS — BP 140/90 | HR 62 | Temp 96.8°F | Ht 71.0 in | Wt 190.4 lb

## 2017-05-18 DIAGNOSIS — Z23 Encounter for immunization: Secondary | ICD-10-CM

## 2017-05-18 DIAGNOSIS — I10 Essential (primary) hypertension: Secondary | ICD-10-CM | POA: Diagnosis not present

## 2017-05-18 DIAGNOSIS — I672 Cerebral atherosclerosis: Secondary | ICD-10-CM

## 2017-05-18 LAB — CUP PACEART REMOTE DEVICE CHECK
Date Time Interrogation Session: 20181012234017
MDC IDC PG IMPLANT DT: 20180515

## 2017-05-18 NOTE — Progress Notes (Signed)
   HPI  Patient presents today for hospital follow-up.  Patient was hospitalized with symptoms of stroke versus TIA, he was treated with TPA and had an anaphylactic reaction. He had complete resolution of symptoms and feels much better.  He is tolerating all medications as prescribed and taking them all with good compliance.  Pressure at home averages 342A systolic. No chest pain or headaches. Good medication compliance.  Patient feels like he is back to himself and feels like he is back to normal. His wife agrees. He's had a mild throat clearing cough, he does not feel that he is ill He would like an influenza vaccine today  PMH: Smoking status noted ROS: Per HPI  Objective: BP 140/90   Pulse 62   Temp (!) 96.8 F (36 C) (Oral)   Ht '5\' 11"'$  (1.803 m)   Wt 190 lb 6.4 oz (86.4 kg)   BMI 26.56 kg/m  Gen: NAD, alert, cooperative with exam HEENT: NCAT, EOMI, PERRL CV: RRR, good S1/S2, no murmur Resp: CTABL, no wheezes, non-labored Ext: No edema, warm Neuro: Alert and oriented, No gross deficits  Assessment and plan:  # Hypertension Elevated, not ideal, however I think that he has a component of whitecoat hypertension. At home he is very well controlled. No changes Labs with mild hyponatremia on discharge  # Cerebral atherosclerosis Patient now status post CVA versus TIA. Patient had anaphylactic reaction to TPA. Continue statin, ACE inhibitor, Plavix.   # Need for influenza vaccine Counseling provided for all components    Orders Placed This Encounter  Procedures  . Flu Vaccine QUAD 36+ mos IM  . [place], MD [place] 05/18/2017, 11:48 AM

## 2017-05-18 NOTE — Patient Instructions (Signed)
Great to see you!  Come back as planned unless you need [place] sooner.

## 2017-05-19 LAB — CMP14+EGFR
ALBUMIN: 4.7 g/dL (ref 3.5–4.7)
ALT: 17 IU/L (ref 0–44)
AST: 20 IU/L (ref 0–40)
Albumin/Globulin Ratio: 2.1 (ref 1.2–2.2)
Alkaline Phosphatase: 87 IU/L (ref 39–117)
BUN / CREAT RATIO: 8 — AB (ref 10–24)
BUN: 8 mg/dL (ref 8–27)
Bilirubin Total: 1 mg/dL (ref 0.0–1.2)
CALCIUM: 9.5 mg/dL (ref 8.6–10.2)
CO2: 24 mmol/L (ref 20–29)
CREATININE: 0.99 mg/dL (ref 0.76–1.27)
Chloride: 92 mmol/L — ABNORMAL LOW (ref 96–106)
GFR calc Af Amer: 80 mL/min/{1.73_m2} (ref >59)
GFR, EST NON AFRICAN AMERICAN: 69 mL/min/{1.73_m2} (ref >59)
GLUCOSE: 120 mg/dL — AB (ref 65–99)
Globulin, Total: 2.2 g/dL (ref 1.5–4.5)
POTASSIUM: 4.1 mmol/L (ref 3.5–5.2)
SODIUM: 135 mmol/L (ref 134–144)
TOTAL PROTEIN: 6.9 g/dL (ref 6.0–8.5)

## 2017-05-25 ENCOUNTER — Other Ambulatory Visit: Payer: Self-pay | Admitting: Family Medicine

## 2017-05-31 ENCOUNTER — Other Ambulatory Visit: Payer: Self-pay | Admitting: Family Medicine

## 2017-05-31 DIAGNOSIS — I10 Essential (primary) hypertension: Secondary | ICD-10-CM

## 2017-06-04 ENCOUNTER — Other Ambulatory Visit: Payer: Self-pay | Admitting: Family Medicine

## 2017-06-14 ENCOUNTER — Ambulatory Visit (INDEPENDENT_AMBULATORY_CARE_PROVIDER_SITE_OTHER): Payer: Medicare Other | Admitting: *Deleted

## 2017-06-14 DIAGNOSIS — I639 Cerebral infarction, unspecified: Secondary | ICD-10-CM

## 2017-06-14 NOTE — Progress Notes (Signed)
Carelink Summary Report / Loop Recorder 

## 2017-06-21 LAB — CUP PACEART REMOTE DEVICE CHECK
Date Time Interrogation Session: 20181112004038
MDC IDC PG IMPLANT DT: 20180515

## 2017-06-22 ENCOUNTER — Ambulatory Visit: Payer: Medicare Other | Admitting: Neurology

## 2017-07-09 ENCOUNTER — Other Ambulatory Visit: Payer: Self-pay | Admitting: Family Medicine

## 2017-07-13 ENCOUNTER — Ambulatory Visit (INDEPENDENT_AMBULATORY_CARE_PROVIDER_SITE_OTHER): Payer: Medicare Other | Admitting: *Deleted

## 2017-07-13 DIAGNOSIS — I639 Cerebral infarction, unspecified: Secondary | ICD-10-CM | POA: Diagnosis not present

## 2017-07-14 NOTE — Progress Notes (Signed)
Carelink Summary Report / Loop Recorder 

## 2017-07-21 LAB — CUP PACEART REMOTE DEVICE CHECK
Implantable Pulse Generator Implant Date: 20180515
MDC IDC SESS DTM: 20181212004004

## 2017-08-04 ENCOUNTER — Other Ambulatory Visit: Payer: Self-pay

## 2017-08-04 NOTE — Patient Outreach (Signed)
Telephone outreach to patient to obtain mRS was successfully completed. mRS = 2 

## 2017-08-12 ENCOUNTER — Ambulatory Visit (INDEPENDENT_AMBULATORY_CARE_PROVIDER_SITE_OTHER): Payer: Medicare Other | Admitting: Family Medicine

## 2017-08-12 ENCOUNTER — Ambulatory Visit (INDEPENDENT_AMBULATORY_CARE_PROVIDER_SITE_OTHER): Payer: Medicare Other | Admitting: *Deleted

## 2017-08-12 ENCOUNTER — Encounter: Payer: Self-pay | Admitting: Family Medicine

## 2017-08-12 VITALS — BP 162/85 | HR 64 | Temp 96.4°F | Ht 71.0 in | Wt 193.4 lb

## 2017-08-12 DIAGNOSIS — N183 Chronic kidney disease, stage 3 unspecified: Secondary | ICD-10-CM

## 2017-08-12 DIAGNOSIS — E785 Hyperlipidemia, unspecified: Secondary | ICD-10-CM | POA: Diagnosis not present

## 2017-08-12 DIAGNOSIS — I10 Essential (primary) hypertension: Secondary | ICD-10-CM

## 2017-08-12 DIAGNOSIS — E1159 Type 2 diabetes mellitus with other circulatory complications: Secondary | ICD-10-CM | POA: Diagnosis not present

## 2017-08-12 DIAGNOSIS — I639 Cerebral infarction, unspecified: Secondary | ICD-10-CM

## 2017-08-12 LAB — BAYER DCA HB A1C WAIVED: HB A1C: 5.8 % (ref <7.0)

## 2017-08-12 MED ORDER — ATORVASTATIN CALCIUM 80 MG PO TABS: 80.0000 mg | ORAL_TABLET | Freq: Every day | ORAL | 3 refills | Status: DC

## 2017-08-12 MED ORDER — HYDROCHLOROTHIAZIDE 25 MG PO TABS: ORAL_TABLET | ORAL | 3 refills | Status: DC

## 2017-08-12 NOTE — Progress Notes (Signed)
   HPI  Patient presents today here for follow-up chronic medicalconditions.   dyslipidemia Patient is tolerating Lipitor well. Needs refill.  Hypertension Good medication compliance, blood pressure at home is typically in the 120s and 130s   Type 2 diabetes Well controlled in his opinion, blood sugar ranges 118-130 in the morning. Good metformin compliance and tolerance.  PMH: Smoking status noted ROS: Per HPI  Objective: BP (!) 162/85   Pulse 64   Temp (!) 96.4 F (35.8 C) (Oral)   Ht _0  (1.803 m)   Wt 193 lb 6.4 oz (87.7 kg)   BMI 26.97 kg/m  Gen: NAD, alert, cooperative with exam HEENT: NCAT CV: RRR, good S1/S2, no murmur Resp: CTABL, no wheezes, non-labored Ext: No edema, warm Neuro: Alert and oriented, No gross deficits  Assessment and plan:  #Type 2 diabetes Well controlled with A1c of 5.8, no changes Continue metformin, monitoring renal function closely with CKD stage III hypertension Blood pressure improved slightly  #Hypertension Blood pressure improved slightly to 503 systolic on recheck with manual cuff, patient has component of whitecoat hypertension No changes   #Hyperlipidemia Tolerating statin well LDL up-to-date and at goal of less than 70 Refill Lipitor    Orders Placed This Encounter  Procedures  . Bayer DCA Hb A1c Waived  . CBC with Differential/Platelet  . CMP14+EGFR    Meds ordered this encounter  Medications  . atorvastatin (LIPITOR) 80 MG tablet    Sig: Take 1 tablet (80 mg total) by mouth daily at 6 PM.    Dispense:  90 tablet    Refill:  3  . hydrochlorothiazide (HYDRODIURIL) 25 MG tablet    Sig: TAKE ONE-HALF TABLET BY MOUTH ONCE DAILY EXCEPT  ON  MONDAY,  WEDNESDAY,  AND  FRIDAY  TAKE  A  WHOLE  TABLET    Dispense:  90 tablet    Refill:  [place], MD [facility] 08/12/2017, 9:25 AM

## 2017-08-12 NOTE — Patient Instructions (Signed)
Great to see you!  Come back in 4 months unless you need us sooner.    

## 2017-08-13 ENCOUNTER — Other Ambulatory Visit: Payer: Self-pay

## 2017-08-13 ENCOUNTER — Encounter (HOSPITAL_COMMUNITY): Payer: Self-pay | Admitting: Emergency Medicine

## 2017-08-13 ENCOUNTER — Emergency Department (HOSPITAL_COMMUNITY): Payer: Medicare Other

## 2017-08-13 ENCOUNTER — Emergency Department (HOSPITAL_COMMUNITY)
Admission: EM | Admit: 2017-08-13 | Discharge: 2017-08-13 | Disposition: A | Discharge status: Home / Self Care | Payer: Medicare Other | Attending: Emergency Medicine | Admitting: Emergency Medicine

## 2017-08-13 DIAGNOSIS — N183 Chronic kidney disease, stage 3 (moderate): Secondary | ICD-10-CM | POA: Insufficient documentation

## 2017-08-13 DIAGNOSIS — Z87891 Personal history of nicotine dependence: Secondary | ICD-10-CM | POA: Diagnosis not present

## 2017-08-13 DIAGNOSIS — Z79899 Other long term (current) drug therapy: Secondary | ICD-10-CM | POA: Diagnosis not present

## 2017-08-13 DIAGNOSIS — R4781 Slurred speech: Secondary | ICD-10-CM | POA: Insufficient documentation

## 2017-08-13 DIAGNOSIS — I129 Hypertensive chronic kidney disease with stage 1 through stage 4 chronic kidney disease, or unspecified chronic kidney disease: Secondary | ICD-10-CM | POA: Diagnosis not present

## 2017-08-13 DIAGNOSIS — I959 Hypotension, unspecified: Secondary | ICD-10-CM | POA: Diagnosis not present

## 2017-08-13 DIAGNOSIS — I10 Essential (primary) hypertension: Secondary | ICD-10-CM | POA: Diagnosis not present

## 2017-08-13 DIAGNOSIS — Z7982 Long term (current) use of aspirin: Secondary | ICD-10-CM | POA: Insufficient documentation

## 2017-08-13 DIAGNOSIS — I259 Chronic ischemic heart disease, unspecified: Secondary | ICD-10-CM | POA: Diagnosis not present

## 2017-08-13 DIAGNOSIS — I6789 Other cerebrovascular disease: Secondary | ICD-10-CM | POA: Diagnosis not present

## 2017-08-13 LAB — CMP14+EGFR
ALBUMIN: 4.5 g/dL (ref 3.5–4.7)
ALK PHOS: 84 IU/L (ref 39–117)
ALT: 18 IU/L (ref 0–44)
AST: 20 IU/L (ref 0–40)
Albumin/Globulin Ratio: 2 (ref 1.2–2.2)
BILIRUBIN TOTAL: 0.9 mg/dL (ref 0.0–1.2)
BUN / CREAT RATIO: 8 — AB (ref 10–24)
BUN: 10 mg/dL (ref 8–27)
CHLORIDE: 95 mmol/L — AB (ref 96–106)
CO2: 26 mmol/L (ref 20–29)
Calcium: 8.9 mg/dL (ref 8.6–10.2)
Creatinine, Ser: 1.23 mg/dL (ref 0.76–1.27)
GFR calc Af Amer: 61 mL/min/{1.73_m2} (ref >59)
GFR calc non Af Amer: 53 mL/min/{1.73_m2} — ABNORMAL LOW (ref >59)
GLUCOSE: 124 mg/dL — AB (ref 65–99)
Globulin, Total: 2.3 g/dL (ref 1.5–4.5)
Potassium: 4 mmol/L (ref 3.5–5.2)
Sodium: 136 mmol/L (ref 134–144)
Total Protein: 6.8 g/dL (ref 6.0–8.5)

## 2017-08-13 LAB — CBC WITH DIFFERENTIAL/PLATELET
BASOS ABS: 0 10*3/uL (ref 0.0–0.2)
Basos: 0 %
EOS (ABSOLUTE): 0.2 10*3/uL (ref 0.0–0.4)
Eos: 3 %
Hematocrit: 39.8 % (ref 37.5–51.0)
Hemoglobin: 13.9 g/dL (ref 13.0–17.7)
Immature Grans (Abs): 0 10*3/uL (ref 0.0–0.1)
Immature Granulocytes: 0 %
LYMPHS ABS: 1.7 10*3/uL (ref 0.7–3.1)
Lymphs: 29 %
MCH: 33.3 pg — AB (ref 26.6–33.0)
MCHC: 34.9 g/dL (ref 31.5–35.7)
MCV: 95 fL (ref 79–97)
Monocytes Absolute: 0.6 10*3/uL (ref 0.1–0.9)
Monocytes: 10 %
NEUTROS ABS: 3.2 10*3/uL (ref 1.4–7.0)
Neutrophils: 58 %
PLATELETS: 196 10*3/uL (ref 150–379)
RBC: 4.18 x10E6/uL (ref 4.14–5.80)
RDW: 12.7 % (ref 12.3–15.4)
WBC: 5.7 10*3/uL (ref 3.4–10.8)

## 2017-08-13 LAB — I-STAT TROPONIN, ED: TROPONIN I, POC: 0.01 ng/mL (ref 0.00–0.08)

## 2017-08-13 MED ORDER — ASPIRIN EC 81 MG PO TBEC
81.0000 mg | DELAYED_RELEASE_TABLET | Freq: Once | ORAL | Status: AC
  Administered 2017-08-13: 81 mg via ORAL
  Filled 2017-08-13: qty 1

## 2017-08-13 MED ORDER — HYDROCHLOROTHIAZIDE 12.5 MG PO CAPS
25.0000 mg | ORAL_CAPSULE | Freq: Once | ORAL | Status: AC
  Administered 2017-08-13: 25 mg via ORAL
  Filled 2017-08-13: qty 2

## 2017-08-13 MED ORDER — METOPROLOL TARTRATE 25 MG PO TABS
100.0000 mg | ORAL_TABLET | Freq: Once | ORAL | Status: AC
  Administered 2017-08-13: 100 mg via ORAL
  Filled 2017-08-13: qty 4

## 2017-08-13 MED ORDER — BENAZEPRIL HCL 20 MG PO TABS
20.0000 mg | ORAL_TABLET | Freq: Once | ORAL | Status: AC
  Administered 2017-08-13: 20 mg via ORAL
  Filled 2017-08-13: qty 1

## 2017-08-13 NOTE — ED Triage Notes (Signed)
Per [employer] [facility] EMS patient coming in for HTN 200/100 with stroke PMH, but no stroke symptoms this morning. Wife did give patient hydralazine and last BP with EMS was 188/88. 18G in right AC.

## 2017-08-13 NOTE — ED Provider Notes (Signed)
[facility] DEPT Provider Note   CSN: [id number] Arrival date & time: 08/13/17  0750     History   Chief Complaint Chief Complaint  Patient presents with  . Hypertension    HPI [name] is a 84 y.o. male.  Patient is an 84 year old male with a history of hypertension, coronary artery disease, prior stroke, GERD, diabetes, CKD, PVD presenting today with elevated blood pressure and mild changes in his speech.  Patient's wife is also present and helping with history.  Patient states he saw his doctor yesterday for a regular routine follow-up which was a good appointment without any significant changes.  He noted last night to not be feeling well and his wife checked his blood pressure and it was 190/100 at home.  She states he had some mild slurring of his speech but no unilateral weakness or numbness.  She gave a dose of hydralazine and they went back to bed.  This morning his speech has still been slightly off and his blood pressure has continued to remain elevated.  He has not taken any of his home medications this morning.  He denies any unilateral weakness or vision changes.  He has had no difficulty ambulating.  He states it is just a little bit harder for him to get his words out and wife says he is just slower than normal.  They both state this is happened multiple times in the past and nobody seems to know why it occurs.  He denies any chest pain, shortness of breath, abdominal pain, nausea or vomiting.  He denies eating anything salty in the last 24 hours.   The history is provided by the patient and the spouse.  Hypertension     Past Medical History:  Diagnosis Date  . Arthritis    "wrists, knees, back" (12/10/2015)  . Asthma   . Cerebral atherosclerosis   . Chronic back pain    "lower primarily" (12/10/2015)  . CORONARY ATHEROSCLEROSIS NATIVE CORONARY ARTERY    Myocardial infarction at [facility] in 1996 with a directional atherectomy of  the 90% LAD stenosis and medical management of 60-70% proximal circumflex and 50% RCA stenosis.  . DYSLIPIDEMIA   . GERD (gastroesophageal reflux disease)   . History of hiatal hernia   . HYPERTENSION   . ILIAC ARTERY ANEURYSM   . Myocardial infarction ([place]) 1996  . Stroke [facility]) 2003; 2013; 07/2015; 12/05/2015   denies residual on 12/10/2015, 12/13/16, 01/08/17  . TIA (transient ischemic attack)   . Type II diabetes mellitus ([place])   . UPPER RESPIRATORY INFECTION     Patient Active Problem List   Diagnosis Date Noted  . Acute encephalopathy   . CVA (cerebral vascular accident) ([place]) 04/21/2017  . PFO (patent foramen ovale) 12/30/2016  . Dyslipidemia 12/30/2016  . Cryptogenic stroke ([place])   . Numbness of tongue 12/13/2016  . Hyponatremia 12/13/2016  . GERD (gastroesophageal reflux disease) 02/25/2016  . Ischemic cerebrovascular accident (CVA) of frontal lobe ([employer]) 12/15/2015  . Malnutrition of moderate degree 12/11/2015  . History of stroke 12/10/2015  . DM type 2 (diabetes mellitus, type 2) ([place]) 12/06/2015  . Dysarthria 12/05/2015  . CKD (chronic kidney disease) stage 3, GFR 30-59 ml/min (HCC) 07/17/2015  . Cerebral infarction ([place]) 07/15/2015  . TIA (transient ischemic attack) 09/12/2013  . Metabolic syndrome 84/13/2440  . Vitamin D deficiency 08/01/2013  . PVD (peripheral vascular disease) ([place]) 11/25/2011  . ILIAC ARTERY ANEURYSM 07/17/2010  . Hyperlipidemia LDL goal <  70 08/28/2009  . CORONARY ATHEROSCLEROSIS NATIVE CORONARY ARTERY 08/28/2009  . Essential hypertension 06/05/2009  . Cerebral atherosclerosis 06/04/2009    Past Surgical History:  Procedure Laterality Date  . ANAL FISSURE REPAIR  1990s  . BASAL CELL CARCINOMA EXCISION Right 12/2015   "temple"  . CATARACT EXTRACTION W/ INTRAOCULAR LENS  IMPLANT, BILATERAL Bilateral 2000s  . CORONARY ANGIOPLASTY  1996  . KNEE CARTILAGE SURGERY Right 1970s  . LOOP RECORDER INSERTION N/A 12/15/2016   Procedure: Loop Recorder  Insertion;  Surgeon: Thompson Grayer, MD;  Location: [facility];  Service: Cardiovascular;  Laterality: N/A;  . SHOULDER ARTHROSCOPY W/ ROTATOR CUFF REPAIR Left 2000s  . TEE WITHOUT CARDIOVERSION N/A 12/15/2016   Procedure: TRANSESOPHAGEAL ECHOCARDIOGRAM (TEE);  Surgeon: Lelon Perla, MD;  Location: [facility] ENDOSCOPY;  Service: Cardiovascular;  Laterality: N/A;       Home Medications    Prior to Admission medications   Medication Sig Start Date End Date Taking? Authorizing Provider  aspirin EC 81 MG tablet Take 1 tablet (81 mg total) by mouth daily. 12/15/16 12/15/17  Geradine Girt, DO  atorvastatin (LIPITOR) 80 MG tablet Take 1 tablet (80 mg total) by mouth daily at 6 PM. 08/12/17   Timmothy Euler, MD  BAYER CONTOUR TEST test strip USE ONE STRIP TO CHECK GLUCOSE TWICE DAILY AND  AS  NEEDED 02/05/17   Timmothy Euler, MD  benazepril (LOTENSIN) 20 MG tablet TAKE 1 TABLET BY MOUTH ONCE DAILY 06/01/17   Timmothy Euler, MD  Cholecalciferol (VITAMIN D) 2000 units CAPS Take 2,000 Units by mouth daily.    [provider]  clopidogrel (PLAVIX) 75 MG tablet TAKE 1 TABLET BY MOUTH ONCE DAILY 07/09/17   Timmothy Euler, MD  feeding supplement, GLUCERNA SHAKE, (GLUCERNA SHAKE) LIQD Take 237 mLs by mouth 2 (two) times daily between meals. 12/13/15   Timmothy Euler, MD  fish oil-omega-3 fatty acids 1000 MG capsule Take 2 g by mouth every evening.     [provider]  Fluticasone-Salmeterol (ADVAIR DISKUS) 100-50 MCG/DOSE AEPB INHALE ONE DOSE BY MOUTH EVERY 12 HOURS 12/15/16   Eulogio Bear U, DO  hydrALAZINE (APRESOLINE) 25 MG tablet Take 1 tablet (25 mg total) by mouth daily as needed. 05/27/16 04/21/17  Minus Breeding, MD  hydrochlorothiazide (HYDRODIURIL) 25 MG tablet TAKE ONE-HALF TABLET BY MOUTH ONCE DAILY EXCEPT  ON  MONDAY,  WEDNESDAY,  AND  FRIDAY  TAKE  A  WHOLE  TABLET 08/12/17   Timmothy Euler, MD  metFORMIN (GLUCOPHAGE-XR) 500 MG 24 hr tablet TAKE 1 TABLET  BY MOUTH TWICE DAILY 06/01/17   Timmothy Euler, MD  metoprolol tartrate (LOPRESSOR) 100 MG tablet TAKE 1 TABLET BY MOUTH TWICE DAILY 06/01/17   Timmothy Euler, MD  nitroGLYCERIN (NITROSTAT) 0.4 MG SL tablet USE AS DIRECTED 06/04/17   Timmothy Euler, MD  pantoprazole (PROTONIX) 40 MG tablet TAKE ONE TABLET BY MOUTH ONCE DAILY 03/26/17   Timmothy Euler, MD  PROAIR HFA 108 [phone] Base) MCG/ACT inhaler INHALE 2 PUFFS BY MOUTH 4 TIMES DAILY 05/27/17   Timmothy Euler, MD    Family History Family History  Problem Relation Age of Onset  . Coronary artery disease Mother   . Cancer Father        lung  . Stroke Father   . Cancer Brother        lung  . Stroke Sister     Social History Social History  Tobacco Use  . Smoking status: Former Smoker    Packs/day: 0.12    Years: 45.00    Pack years: 5.40    Types: Cigarettes    Last attempt to quit: 11/24/1984    Years since quitting: 32.7  . Smokeless tobacco: Former Systems developer    Types: Snuff  . Tobacco comment: "dipped snuff when I was a boy"  Substance Use Topics  . Alcohol use: No    Alcohol/week: 0.0 oz  . Drug use: No     Allergies   Alteplase; Codeine; Lopid [gemfibrozil]; and Voltaren [diclofenac sodium]   Review of Systems Review of Systems  All other systems reviewed and are negative.    Physical Exam Updated Vital Signs BP (!) 161/113 (BP Location: Left Arm)   Pulse 79   Temp 98.1 F (36.7 C) (Oral)   Resp 18   Ht 5\' 11"  (1.803 m)   Wt 87.5 kg (193 lb)   SpO2 97%   BMI 26.92 kg/m   Physical Exam  Constitutional: He is oriented to person, place, and time. He appears well-developed and well-nourished. No distress.  HENT:  Head: Normocephalic and atraumatic.  Mouth/Throat: Oropharynx is clear and moist.  Eyes: Conjunctivae and EOM are normal. Pupils are equal, round, and reactive to light.  Neck: Normal range of motion. Neck supple.  Cardiovascular: Normal rate, regular rhythm and intact distal  pulses.  No murmur heard. Pulmonary/Chest: Effort normal and breath sounds normal. No respiratory distress. He has no wheezes. He has no rales.  Abdominal: Soft. He exhibits no distension. There is no tenderness. There is no rebound and no guarding.  Musculoskeletal: Normal range of motion. He exhibits no edema or tenderness.  Neurological: He is alert and oriented to person, place, and time. He has normal strength. No cranial nerve deficit or sensory deficit. Coordination and gait normal.  No visual field cuts.  Normal heel to shin testing.  No pronator drift.  Speech might be mildly delayed occasionally but otherwise appropriate  Skin: Skin is warm and dry. No rash noted. No erythema.  Psychiatric: He has a normal mood and affect. His behavior is normal.  Nursing note and vitals reviewed.    ED Treatments / Results  Labs (all labs ordered are listed, but only abnormal results are displayed) Labs Reviewed  I-STAT TROPONIN, ED    EKG  EKG Interpretation  Date/Time:  Friday August 13 2017 09:56:07 EST Ventricular Rate:  74 PR Interval:    QRS Duration: 89 QT Interval:  392 QTC Calculation: 435 R Axis:   -25 Text Interpretation:  Sinus rhythm Inferior infarct, old No significant change since last tracing Confirmed by Blanchie Dessert [phone]) on 08/13/2017 10:45:19 AM       Radiology Ct Head Wo Contrast  Result Date: 08/13/2017 CLINICAL DATA:  Focal neurologic deficit greater than 6 hours. Hypertension EXAM: CT HEAD WITHOUT CONTRAST TECHNIQUE: Contiguous axial images were obtained from the base of the skull through the vertex without intravenous contrast. COMPARISON:  MRI head 04/21/2017 FINDINGS: Brain: Moderate atrophy. Chronic microvascular ischemic changes in the white matter similar to prior studies. Negative for acute infarct. Negative for hemorrhage mass or fluid collection. No midline shift. Vascular: Atherosclerotic calcification in the cavernous carotid bilaterally.  Negative for hyperdense vessel Skull: Negative Sinuses/Orbits: Paranasal sinuses clear. No orbital mass. Left cataract removal. Other: None IMPRESSION: No acute abnormality. Atrophy and chronic microvascular ischemic changes similar to prior study. Electronically Signed   By: Franchot Gallo M.D.  On: 08/13/2017 09:55    Procedures Procedures (including critical care time)  Medications Ordered in ED Medications  aspirin EC tablet 81 mg (81 mg Oral Given 08/13/17 1014)  benazepril (LOTENSIN) tablet 20 mg (20 mg Oral Given 08/13/17 1013)  metoprolol tartrate (LOPRESSOR) tablet 100 mg (100 mg Oral Given 08/13/17 0947)  hydrochlorothiazide (MICROZIDE) capsule 25 mg (25 mg Oral Given 08/13/17 0947)     Initial Impression / Assessment and Plan / ED Course  I have reviewed the triage vital signs and the nursing notes.  Pertinent labs & imaging results that were available during my care of the patient were reviewed by me and considered in my medical decision making (see chart for details).    Patient is an elderly male presenting today with hypertension and slight change in his speech.  He has no focal deficits on exam and is well-appearing.  Possibly slight delay intermittently of his speech but no aphasia.  He denies headache, chest pain or shortness of breath.  He is hypertensive here between 160/115-170/100.  Patient did take at hydralazine last night but has not taken any blood pressure medication this morning.  He saw his PCP yesterday for routine follow-up and everything was good.  In the office his blood pressure was between 277-412 systolic.  Patient has taken all of his medications yesterday as prescribed and denies any excess salt intake.  Patient wife states that this is happened multiple times and nobody knows what causes it. Patient had lab work done yesterday in the office which showed an unchanged CBC.  CMP showed a mild bump in his creatinine from .99-1.23 but the rest of the labs were  unremarkable. Patient was last admitted for stroke workup in September of this past year with unchanged MRI and MRA showing white matter changes suggestive of chronic ischemic microangiopathy and no evidence of large vessel occlusion.  Concerned that possible elevated blood pressure may be the cause of his speech symptoms today.  Lower suspicion for stroke at this time.  EKG, troponin, head CT pending.  We will give patient all of his morning medications and continue to monitor his blood pressure.  10:45 AM Head CT without acute findings, trop wnl and ekg unchanged.  After meds pt's BP has improved to 156/88.  Pt is feeling better.  Discussed pt with Dr. Leonie Man his neurologist and do not feel he needs further imaging with MRI today.    Final Clinical Impressions(s) / ED Diagnoses   Final diagnoses:  Hypertension, unspecified type    ED Discharge Orders    None       Blanchie Dessert, MD 08/13/17 1102

## 2017-08-13 NOTE — ED Notes (Signed)
Pt is alert and orinted x 4 and is verbally responsive. Pt family is at bedside.

## 2017-08-14 ENCOUNTER — Telehealth: Payer: Self-pay

## 2017-08-16 ENCOUNTER — Other Ambulatory Visit: Payer: Self-pay | Admitting: Family Medicine

## 2017-08-17 NOTE — Progress Notes (Signed)
Carelink Summary Report / Loop Recorder 

## 2017-08-19 DIAGNOSIS — Z885 Allergy status to narcotic agent status: Secondary | ICD-10-CM | POA: Diagnosis not present

## 2017-08-19 DIAGNOSIS — J342 Deviated nasal septum: Secondary | ICD-10-CM | POA: Diagnosis not present

## 2017-08-19 DIAGNOSIS — J343 Hypertrophy of nasal turbinates: Secondary | ICD-10-CM | POA: Diagnosis not present

## 2017-08-19 DIAGNOSIS — H6123 Impacted cerumen, bilateral: Secondary | ICD-10-CM | POA: Diagnosis not present

## 2017-08-19 DIAGNOSIS — J328 Other chronic sinusitis: Secondary | ICD-10-CM | POA: Diagnosis not present

## 2017-08-19 DIAGNOSIS — Z888 Allergy status to other drugs, medicaments and biological substances status: Secondary | ICD-10-CM | POA: Diagnosis not present

## 2017-08-19 DIAGNOSIS — H918X1 Other specified hearing loss, right ear: Secondary | ICD-10-CM | POA: Diagnosis not present

## 2017-08-24 LAB — CUP PACEART REMOTE DEVICE CHECK
Date Time Interrogation Session: 20190111011129
Implantable Pulse Generator Implant Date: 20180515

## 2017-09-07 DIAGNOSIS — H903 Sensorineural hearing loss, bilateral: Secondary | ICD-10-CM | POA: Insufficient documentation

## 2017-09-10 ENCOUNTER — Other Ambulatory Visit: Payer: Self-pay | Admitting: Family Medicine

## 2017-09-13 ENCOUNTER — Ambulatory Visit: Payer: Medicare Other | Admitting: Nurse Practitioner

## 2017-09-13 ENCOUNTER — Ambulatory Visit (INDEPENDENT_AMBULATORY_CARE_PROVIDER_SITE_OTHER): Payer: Medicare Other | Admitting: *Deleted

## 2017-09-13 DIAGNOSIS — I639 Cerebral infarction, unspecified: Secondary | ICD-10-CM

## 2017-09-14 NOTE — Progress Notes (Signed)
Carelink Summary Report / Loop Recorder 

## 2017-09-21 DIAGNOSIS — M79676 Pain in unspecified toe(s): Secondary | ICD-10-CM | POA: Diagnosis not present

## 2017-09-21 DIAGNOSIS — E119 Type 2 diabetes mellitus without complications: Secondary | ICD-10-CM | POA: Diagnosis not present

## 2017-09-21 DIAGNOSIS — L84 Corns and callosities: Secondary | ICD-10-CM | POA: Diagnosis not present

## 2017-09-21 DIAGNOSIS — B351 Tinea unguium: Secondary | ICD-10-CM | POA: Diagnosis not present

## 2017-10-05 LAB — CUP PACEART REMOTE DEVICE CHECK
Implantable Pulse Generator Implant Date: 20180515
MDC IDC SESS DTM: 20190210013957

## 2017-10-12 ENCOUNTER — Ambulatory Visit: Payer: Medicare Other | Admitting: Nurse Practitioner

## 2017-10-12 ENCOUNTER — Encounter: Payer: Self-pay | Admitting: Nurse Practitioner

## 2017-10-12 VITALS — BP 130/72 | HR 60 | Wt 193.2 lb

## 2017-10-12 DIAGNOSIS — E1159 Type 2 diabetes mellitus with other circulatory complications: Secondary | ICD-10-CM

## 2017-10-12 DIAGNOSIS — E785 Hyperlipidemia, unspecified: Secondary | ICD-10-CM

## 2017-10-12 DIAGNOSIS — I6300 Cerebral infarction due to thrombosis of unspecified precerebral artery: Secondary | ICD-10-CM | POA: Diagnosis not present

## 2017-10-12 DIAGNOSIS — I10 Essential (primary) hypertension: Secondary | ICD-10-CM | POA: Diagnosis not present

## 2017-10-12 NOTE — Patient Instructions (Signed)
Stressed the importance of management of risk factors to prevent further stroke Continue Plavix and Aspirin for secondary stroke prevention Maintain strict control of hypertension with blood pressure goal below 130/90, today's reading 130/72 continue antihypertensive medications Control of diabetes with hemoglobin A1c below 6.5 followed by primary care  continue diabetic medications Cholesterol with LDL cholesterol less than 70, followed by primary care,     continue Lipitor  Exercise by walking, recommend 30 minutes daily  use cane for safe ambulation eat healthy diet with whole grains,  fresh fruits and vegetables Loop recorder so far without arrhythmias Follow up in 6 months

## 2017-10-12 NOTE — Progress Notes (Signed)
[facility]  PATIENT: [name] DOB: 1930/05/08   REASON FOR VISIT:  follow up for left hemispheric TIA versus small MCA branch infarctSeptember 2018 not identified on imaging, IV TPA with good clinical recovery previous stroke right MCA infarcts in may 2018,  HISTORY FROM:patient and wife    HISTORY OF PRESENT ILLNESS:FROM RECORD Matthew C Wilsonis a 84 y.o.malewho reports that he experienced transient left tongue numbness. His family who were with him noticed that his left face drooped and his speech became thick. This has happened twice today. It is currently completely back to baseline. It happened also last Thursday. He has a history of multiple strokes, but has not had one since at least last a year. He is currently at baseline. Patient was not administered IV t-PA secondary to resolution of symptoms. He was admitted for further evaluation and treatment.He presented with transient numbness of the left border of his tongue and facial droop and slurred speech which is improving. He has history of multiple strokes in the past which were felt to be small vessel disease and he has been on aspirin, Aggrenox, aspirin and Plavix and more recently Plavix alone.12/14/16  MRI of the brain with several small right MCA infarcts, small vessel disease and atrophy. MRA no large vessel occlusion. 2-D echo 60-65% no source of embolus. LDL 49 hemoglobin A1c 6.3. Loop recorder placed. Interval history: 07/11/2018[name], 84 year old male returns for hospital follow-up. He remains on Plavix and aspirin for secondary stroke prevention. He has minimal bruising or bleeding He had an ER visit on 01/08/2017 for numbness on the right side of the face and the right hand. No changes were made in medication regimen  he was not admitted. His symptoms have resolved by the time  he was evaluated. He remains on Lipitor for hyperlipidemia without complaints of myalgias. Blood pressure in the office  today 151/81. Loop recorder has been placed that so far no evidence of arrhythmias. He returns for reevaluation  Patient was initially evaluated after a stroke in December 2016. He was readmitted on May 2017 with recurrent dysarthria and MRI showed a small left frontal lacunar infarct  PER RECORDMr. Ortega is a 61 year Caucasian male with a history of cerebral atherosclerosis, CAD, hyperlipidemia, hypertension, iliac artery aneurysm, MI, prior strokes (2003, 2013, 2016, 2017), TIA, and DM2 who presented to [facility] via EMS after sudden-onset RUE weakness and difficulty speaking and understanding language around 10:15 on 04/21/2017.  He is known to me from prior admission for stroke in office visit. History is obtained from the patient's wife is available at the bedside as well as from EMS and review of electronic medical records.  On scene, EMS noted initial improvement in his right-sided weakness as well as speech to some extent but by the time they reached [facility] the patient's speech had again gotten significantly worse but he did not have any recurrent right-sided weakness. Admit the patient upon arrival and CT scan of the head was obtained which showed no acute abnormalities. Patient blood pressure significantly elevated upon admission but this was controlled with IV nicardipine drip and he was given IV TPA with daughter negative time being 31 minutes. There was some delay secondary to starting an additional IV for emergentCT angiogram was ordered. CT angiogram personally reviewed shows no large vessel occlusion. Patient tolerated TPA infusion well but after the infusion was completed developed the significant swelling of the tongue as well as left side of the cheek.  This was felt to be angioedema and he was given IV Solu-Medrol and Benadryl. He did not appear to be in respiratory distress. The patient apparently had been doing well until prior to his onset of stroke symptoms. He had denied any  headache, vomiting and loss of vision. He had a prior history of embolic right MCA branch infarcts in May 2018 for which he was hospitalized at [facility] He had extensive evaluation including TEE as well as loop recorder insertion. He was seen in the office a few weeks ago and had been doing well he was on aspirin and Plavix. Patient had not had any recent surgeries. He had no known prior allergy to TPA. Last seen normal; ; 10 :15 On 04/21/17 IV TPA given ; Yes baseline MRS 1 He received IV tPA at 1246 on 04/21/2017.  He was admitted to the neuro ICU for further evaluation and treatment. His wife is at the bedside.  The patient is awake, alert, and follows all commands appropriately.  His speech seems to have improved back to normal and right-sided peripheral vision loss is also improving. Blood pressure has been adequately controlled. No new complaints. Tongue swelling has resolved Interval history 05/11/17 [name], 84 year old male returns for hospital follow-up after left hemispheric TIA versus small MCA branch infarct not  identified on imaging on 04/21/2017. MRA no large vessel occlusion. CTA head and neck no large vessel or high-grade stenosis. 2-D echo EF 60-65% no source of embolus. LDL 109 hemoglobin A1c 5.7 remains on aspirin and Plavix for secondary stroke prevention without further stroke or TIA symptoms. He has minor bruising and no bleeding.His atorvastatin was increased to 80 mg daily. He denies myalgias.  Blood pressure in the office today 140/80.he returns for reevaluation. Loop recorder without evidence of arrhythmias.  UPDATE 3/12/2018[name], 84 year old male returns for follow-up with history of left hemispheric TIA versus small MCA branch infarct in September 2018.  He remains on aspirin and Plavix for secondary stroke prevention he has minor bruising but no bleeding he has not had recurrent stroke or TIA symptoms however he was seen in the emergency room for hypertension  in January blood pressure 170/100.  His medications were changed he remains on Lipitor 80 mg daily without myalgias blood pressure in the office today 130/72.  Loop recorder so far without evidence of atrial fibrillation.  CBGs 110-130 most days.  He gets little exercise and was encouraged to do so.  He returns for reevaluation REVIEW OF SYSTEMS: Full 14 system review of systems performed and notable only for those listed, all others are neg:  Constitutional: neg  Cardiovascular: neg Ear/Nose/Throat: neg  Skin: neg Eyes: neg Respiratory: neg Gastroitestinal: neg Hematology/Lymphatic: neg  Endocrine: neg Musculoskeletal Right knee pain llergy/Immunology: neg Neurological: neg Psychiatric: neg Sleep : neg   ALLERGIES: Allergies  Allergen Reactions  . Alteplase Anaphylaxis  . Codeine Other (See Comments)    Bad dreams  . Lopid [Gemfibrozil] Nausea Only  . Voltaren [Diclofenac Sodium] Diarrhea    HOME MEDICATIONS: Outpatient Medications Prior to Visit  Medication Sig Dispense Refill  . aspirin EC 81 MG tablet Take 1 tablet (81 mg total) by mouth daily. 150 tablet 2  . atorvastatin (LIPITOR) 80 MG tablet Take 1 tablet (80 mg total) by mouth daily at 6 PM. 90 tablet 3  . BAYER CONTOUR TEST test strip USE ONE STRIP TO CHECK GLUCOSE TWICE DAILY AND  AS  NEEDED 100 each 11  . benazepril (LOTENSIN)  20 MG tablet TAKE 1 TABLET BY MOUTH ONCE DAILY 90 tablet 1  . Cholecalciferol (VITAMIN D) 2000 units CAPS Take 2,000 Units by mouth daily.    . clopidogrel (PLAVIX) 75 MG tablet TAKE 1 TABLET BY MOUTH ONCE DAILY 90 tablet 3  . feeding supplement, GLUCERNA SHAKE, (GLUCERNA SHAKE) LIQD Take 237 mLs by mouth 2 (two) times daily between meals. 60 Can 11  . fish oil-omega-3 fatty acids 1000 MG capsule Take 2 g by mouth every evening.     . Fluticasone-Salmeterol (ADVAIR DISKUS) 100-50 MCG/DOSE AEPB INHALE ONE DOSE BY MOUTH EVERY 12 HOURS 180 each 2  . hydrochlorothiazide (HYDRODIURIL) 25 MG tablet  TAKE ONE-HALF TABLET BY MOUTH ONCE DAILY EXCEPT  ON  MONDAY,  WEDNESDAY,  AND  FRIDAY  TAKE  A  WHOLE  TABLET 90 tablet 3  . metFORMIN (GLUCOPHAGE-XR) 500 MG 24 hr tablet TAKE 1 TABLET BY MOUTH TWICE DAILY 180 tablet 0  . metoprolol tartrate (LOPRESSOR) 100 MG tablet TAKE 1 TABLET BY MOUTH TWICE DAILY 180 tablet 1  . nitroGLYCERIN (NITROSTAT) 0.4 MG SL tablet USE AS DIRECTED 25 tablet 1  . pantoprazole (PROTONIX) 40 MG tablet TAKE ONE TABLET BY MOUTH ONCE DAILY 30 tablet 11  . PROAIR HFA 108 (90 Base) MCG/ACT inhaler INHALE 2 PUFFS BY MOUTH 4 TIMES DAILY 9 each 2  . hydrALAZINE (APRESOLINE) 25 MG tablet Take 1 tablet (25 mg total) by mouth daily as needed. 90 tablet 3  . ADVAIR DISKUS 100-50 MCG/DOSE AEPB INHALE ONE DOSE BY MOUTH EVERY 12 HOURS AS NEEDED 180 each 2   No facility-administered medications prior to visit.     PAST MEDICAL HISTORY: Past Medical History:  Diagnosis Date  . Arthritis    "wrists, knees, back" (12/10/2015)  . Asthma   . Cerebral atherosclerosis   . Chronic back pain    "lower primarily" (12/10/2015)  . CORONARY ATHEROSCLEROSIS NATIVE CORONARY ARTERY    Myocardial infarction at [facility] in 1996 with a directional atherectomy of the 90% LAD stenosis and medical management of 60-70% proximal circumflex and 50% RCA stenosis.  . DYSLIPIDEMIA   . GERD (gastroesophageal reflux disease)   . History of hiatal hernia   . HYPERTENSION   . ILIAC ARTERY ANEURYSM   . Myocardial infarction ([place]) 1996  . Stroke [facility]) 2003; 2013; 07/2015; 12/05/2015   denies residual on 12/10/2015, 12/13/16, 01/08/17  . TIA (transient ischemic attack)   . Type II diabetes mellitus ([place])   . UPPER RESPIRATORY INFECTION     PAST SURGICAL HISTORY: Past Surgical History:  Procedure Laterality Date  . ANAL FISSURE REPAIR  1990s  . BASAL CELL CARCINOMA EXCISION Right 12/2015   "temple"  . CATARACT EXTRACTION W/ INTRAOCULAR LENS  IMPLANT, BILATERAL Bilateral 2000s  . CORONARY ANGIOPLASTY  1996  .  KNEE CARTILAGE SURGERY Right 1970s  . LOOP RECORDER INSERTION N/A 12/15/2016   Procedure: Loop Recorder Insertion;  Surgeon: Thompson Grayer, MD;  Location: [place] [facility] LAB;  Service: Cardiovascular;  Laterality: N/A;  . SHOULDER ARTHROSCOPY W/ ROTATOR CUFF REPAIR Left 2000s  . TEE WITHOUT CARDIOVERSION N/A 12/15/2016   Procedure: TRANSESOPHAGEAL ECHOCARDIOGRAM (TEE);  Surgeon: Lelon Perla, MD;  Location: [facility] ENDOSCOPY;  Service: Cardiovascular;  Laterality: N/A;    FAMILY HISTORY: Family History  Problem Relation Age of Onset  . Coronary artery disease Mother   . Cancer Father        lung  . Stroke Father   . Cancer Brother  lung  . Stroke Sister     SOCIAL HISTORY: Social History   Socioeconomic History  . Marital status: Married    Spouse name: Not on file  . Number of children: Not on file  . Years of education: Not on file  . Highest education level: Not on file  Social Needs  . Financial resource strain: Not on file  . Food insecurity - worry: Not on file  . Food insecurity - inability: Not on file  . Transportation needs - medical: Not on file  . Transportation needs - non-medical: Not on file  Occupational History  . Occupation: Scientist, research (physical sciences) Works    Comment: retired   Tobacco Use  . Smoking status: Former Smoker    Packs/day: 0.12    Years: 45.00    Pack years: 5.40    Types: Cigarettes    Last attempt to quit: 11/24/1984    Years since quitting: 32.9  . Smokeless tobacco: Former Systems developer    Types: Snuff  . Tobacco comment: "dipped snuff when I was a boy"  Substance and Sexual Activity  . Alcohol use: No    Alcohol/week: 0.0 oz  . Drug use: No  . Sexual activity: No  Other Topics Concern  . Not on file  Social History Narrative  . Not on file     PHYSICAL EXAM  Vitals:   05/11/17 1022  BP: (!) 140/80   Pulse: 60  Weight: 190 lb 9.6 oz (86.5 kg)  Height: 5\' 11"  (1.803 m)   Body mass index is 26.95 kg/m.  Generalized: Well developed, in  no acute distress  Head: normocephalic and atraumatic,. Oropharynx benign  Neck: Supple, no carotid bruits  Cardiac: Regular rate rhythm, no murmur  Musculoskeletal: No deformity   Neurological examination   Mentation: Alert oriented to time, place, history taking. Attention span and concentration appropriate. Recent and remote memory intact.  Follows all commands speech and language fluent.   Cranial nerve II-XII: Pupils were equal round reactive to light extraocular movements were full, left field cut,.  mild left lower facial asymmetry, hearing was intact to finger rubbing bilaterally. Uvula tongue midline. head turning and shoulder shrug were normal and symmetric.Tongue protrusion into cheek strength was normal. Motor: normal bulk and tone, full strength in the BUE, BLE, fine finger movements normal, no pronator drift. No focal weakness Sensory: normal and symmetric to light touch in the upper and lower extremities,  Coordination: finger-nose-finger, heel-to-shin bilaterally, no dysmetria, no tremor Reflexes:  symmetric upper and lower, plantar responses were flexor bilaterally. Gait and Station: Rising up from seated position without assistance, normal stance,  moderate stride, good arm swing, smooth turning, able to perform tiptoe, and heel walking without difficulty. Patient has a limp on the right due to knee pain .  Ambulates with single-point cane  DIAGNOSTIC DATA (LABS, IMAGING, TESTING) - I reviewed patient records, labs, notes, testing and imaging myself where available.  Lab Results  Component Value Date   WBC 5.7 08/12/2017   HGB 13.9 08/12/2017   HCT 39.8 08/12/2017   MCV 95 08/12/2017   PLT 196 08/12/2017      Component Value Date/Time   NA 136 08/12/2017 1243   K 4.0 08/12/2017 1243   CL 95 (L) 08/12/2017 1243   CO2 26 08/12/2017 1243   GLUCOSE 124 (H) 08/12/2017 1243   GLUCOSE 155 (H) 04/21/2017 1133   BUN 10 08/12/2017 1243   CREATININE 1.23 08/12/2017 1243    CREATININE 1.27 02/02/2013 0853  CALCIUM 8.9 08/12/2017 1243   PROT 6.8 08/12/2017 1243   ALBUMIN 4.5 08/12/2017 1243   AST 20 08/12/2017 1243   ALT 18 08/12/2017 1243   ALKPHOS 84 08/12/2017 1243   BILITOT 0.9 08/12/2017 1243   GFRNONAA 53 (L) 08/12/2017 1243   GFRNONAA 53 (L) 02/02/2013 0853   GFRAA 61 08/12/2017 1243   GFRAA 61 02/02/2013 0853   Lab Results  Component Value Date   CHOL 109 04/22/2017   HDL 41 04/22/2017   LDLCALC 56 04/22/2017   TRIG 62 04/22/2017   CHOLHDL 2.7 04/22/2017   Lab Results  Component Value Date   HGBA1C 5.7 (H) 04/22/2017    ASSESSMENT AND PLAN  84 y.o. year old male  has a past medical history of Arthritis;  Cerebral atherosclerosis; CORONARY ATHEROSCLEROSIS NATIVE CORONARY ARTERY; DYSLIPIDEMIA;  HYPERTENSION; ILIAC ARTERY ANEURYSM; Myocardial infarction ([place]) (1996); Stroke [facility]) (2003; 2013; 07/2015; 12/05/2015); TIA (transient ischemic attack); Type II diabetes mellitus ([place]);  Here for  follow-up for right MCA infarcts in May 2018 and .left hemispheric TIA versus small MCA branch infarctSeptember 2018 not identified on imaging  PLAN: Stressed the importance of management of risk factors to prevent further stroke Continue Plavix and Aspirin for secondary stroke prevention Maintain strict control of hypertension with blood pressure goal below 130/90, today's reading 130/72 continue antihypertensive medications Control of diabetes with hemoglobin A1c below 6.5 followed by primary care  continue diabetic medications Cholesterol with LDL cholesterol less than 70, followed by primary care,     continue Lipitor  Exercise by walking, recommend 30 minutes daily  use cane for safe ambulation eat healthy diet with whole grains,  fresh fruits and vegetables Loop recorder so far without arrhythmias Follow up in 6 months, if continues stable will discharge I spent 25 minutes in total face to face time with the patient more than 50% of which was spent  counseling and coordination of care, reviewing test results reviewing medications and discussing and reviewing the diagnosis of stroke and management of risk factors.  Discussed the importance of exercise.  Answered additional questions Dennie Bible, [facility], [facility], APRN  [facility] [address] [place], [place] [address] [phone]

## 2017-10-13 ENCOUNTER — Other Ambulatory Visit: Payer: Self-pay | Admitting: Dermatology

## 2017-10-13 DIAGNOSIS — L570 Actinic keratosis: Secondary | ICD-10-CM | POA: Diagnosis not present

## 2017-10-13 DIAGNOSIS — C44319 Basal cell carcinoma of skin of other parts of face: Secondary | ICD-10-CM | POA: Diagnosis not present

## 2017-10-13 DIAGNOSIS — D485 Neoplasm of uncertain behavior of skin: Secondary | ICD-10-CM | POA: Diagnosis not present

## 2017-10-13 DIAGNOSIS — C44329 Squamous cell carcinoma of skin of other parts of face: Secondary | ICD-10-CM | POA: Diagnosis not present

## 2017-10-13 DIAGNOSIS — D0461 Carcinoma in situ of skin of right upper limb, including shoulder: Secondary | ICD-10-CM | POA: Diagnosis not present

## 2017-10-14 ENCOUNTER — Ambulatory Visit (INDEPENDENT_AMBULATORY_CARE_PROVIDER_SITE_OTHER): Payer: Medicare Other | Admitting: *Deleted

## 2017-10-14 DIAGNOSIS — I639 Cerebral infarction, unspecified: Secondary | ICD-10-CM | POA: Diagnosis not present

## 2017-10-15 ENCOUNTER — Ambulatory Visit: Payer: Medicare Other | Admitting: Nurse Practitioner

## 2017-10-15 NOTE — Progress Notes (Signed)
Carelink Summary Report / Loop Recorder 

## 2017-10-15 NOTE — Progress Notes (Signed)
I agree with the above plan 

## 2017-10-21 ENCOUNTER — Other Ambulatory Visit: Payer: Self-pay | Admitting: Family Medicine

## 2017-11-08 ENCOUNTER — Encounter: Payer: Self-pay | Admitting: *Deleted

## 2017-11-08 ENCOUNTER — Ambulatory Visit (INDEPENDENT_AMBULATORY_CARE_PROVIDER_SITE_OTHER): Payer: Medicare Other | Admitting: *Deleted

## 2017-11-08 VITALS — BP 135/75 | HR 77 | Ht 67.0 in | Wt 194.0 lb

## 2017-11-08 DIAGNOSIS — Z0000 Encounter for general adult medical examination without abnormal findings: Secondary | ICD-10-CM | POA: Diagnosis not present

## 2017-11-08 NOTE — Progress Notes (Addendum)
Subjective:   [name] is a 84 y.o. male who presents for an Initial Medicare Annual Wellness Visit.  [name] is accompanied today by his wife of 16 years.  It is just the 2 of them that live at their home.  They have 3 children, 7 grandchildren and 6 great grandchildren who all live locally.  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  He enjoys horses, gardenging, and going to church.  [name] feels his health is about the same this year as it was last year.  He denies any hospitalizations or surgeries in the past year, but did go to the ER for hypertension and speech change in 08/2017.   Review of Systems  Musculoskeletal- right knee pain      Objective:    Today's Vitals   11/08/17 1317  BP: 135/75  Pulse: 77  Weight: 194 lb (88 kg)  Height: 5\' 7"  (1.702 m)  PainSc: 2   PainLoc: Knee   Body mass index is 30.38 kg/m.  Advanced Directives 11/08/2017 04/21/2017 01/08/2017 12/13/2016 12/10/2015 12/05/2015 07/15/2015  Does Patient Have a Medical Advance Directive? No No No No No No No  Would patient like information on creating a medical advance directive? - No - Patient declined No - Patient declined - No - patient declined information - No - patient declined information   Patient declined information on Advanced Directives  Current Medications (verified) Outpatient Encounter Medications as of 11/08/2017  Medication Sig  . albuterol (PROVENTIL HFA;VENTOLIN HFA) 108 (90 Base) MCG/ACT inhaler INHALE 2 PUFFS BY MOUTH 4 TIMES DAILY  . aspirin EC 81 MG tablet Take 1 tablet (81 mg total) by mouth daily.  Marland Kitchen atorvastatin (LIPITOR) 80 MG tablet Take 1 tablet (80 mg total) by mouth daily at 6 PM.  . BAYER CONTOUR TEST test strip USE ONE STRIP TO CHECK GLUCOSE TWICE DAILY AND  AS  NEEDED  . benazepril (LOTENSIN) 20 MG tablet TAKE 1 TABLET BY MOUTH ONCE DAILY  . Cholecalciferol (VITAMIN D) 2000 units CAPS Take 2,000 Units by mouth daily.  . clopidogrel  (PLAVIX) 75 MG tablet TAKE 1 TABLET BY MOUTH ONCE DAILY  . feeding supplement, GLUCERNA SHAKE, (GLUCERNA SHAKE) LIQD Take 237 mLs by mouth 2 (two) times daily between meals.  . fish oil-omega-3 fatty acids 1000 MG capsule Take 2 g by mouth every evening.   . Fluticasone-Salmeterol (ADVAIR DISKUS) 100-50 MCG/DOSE AEPB INHALE ONE DOSE BY MOUTH EVERY 12 HOURS  . hydrochlorothiazide (HYDRODIURIL) 25 MG tablet TAKE ONE-HALF TABLET BY MOUTH ONCE DAILY EXCEPT  ON  MONDAY,  WEDNESDAY,  AND  FRIDAY  TAKE  A  WHOLE  TABLET  . metFORMIN (GLUCOPHAGE-XR) 500 MG 24 hr tablet TAKE 1 TABLET BY MOUTH TWICE DAILY  . metoprolol tartrate (LOPRESSOR) 100 MG tablet TAKE 1 TABLET BY MOUTH TWICE DAILY  . nitroGLYCERIN (NITROSTAT) 0.4 MG SL tablet USE AS DIRECTED  . pantoprazole (PROTONIX) 40 MG tablet TAKE ONE TABLET BY MOUTH ONCE DAILY  . hydrALAZINE (APRESOLINE) 25 MG tablet Take 1 tablet (25 mg total) by mouth daily as needed.   No facility-administered encounter medications on file as of 11/08/2017.     Allergies (verified) Alteplase; Codeine; Lopid [gemfibrozil]; and Voltaren [diclofenac sodium]   History: Past Medical History:  Diagnosis Date  . Arthritis    "wrists, knees, back" (12/10/2015)  . Asthma   . Cerebral atherosclerosis   . Chronic back pain    "  lower primarily" (12/10/2015)  . CORONARY ATHEROSCLEROSIS NATIVE CORONARY ARTERY    Myocardial infarction at [facility] in 1996 with a directional atherectomy of the 90% LAD stenosis and medical management of 60-70% proximal circumflex and 50% RCA stenosis.  . DYSLIPIDEMIA   . GERD (gastroesophageal reflux disease)   . History of hiatal hernia   . HYPERTENSION   . ILIAC ARTERY ANEURYSM   . Myocardial infarction ([place]) 1996  . Stroke [facility]) 2003; 2013; 07/2015; 12/05/2015   denies residual on 12/10/2015, 12/13/16, 01/08/17  . TIA (transient ischemic attack)   . Type II diabetes mellitus ([place])   . UPPER RESPIRATORY INFECTION    Past Surgical History:    Procedure Laterality Date  . ANAL FISSURE REPAIR  1990s  . BASAL CELL CARCINOMA EXCISION Right 12/2015   "temple"  . CATARACT EXTRACTION W/ INTRAOCULAR LENS  IMPLANT, BILATERAL Bilateral 2000s  . CORONARY ANGIOPLASTY  1996  . KNEE CARTILAGE SURGERY Right 1970s  . LOOP RECORDER INSERTION N/A 12/15/2016   Procedure: Loop Recorder Insertion;  Surgeon: Thompson Grayer, MD;  Location: [place] [facility];  Service: Cardiovascular;  Laterality: N/A;  . SHOULDER ARTHROSCOPY W/ ROTATOR CUFF REPAIR Left 2000s  . TEE WITHOUT CARDIOVERSION N/A 12/15/2016   Procedure: TRANSESOPHAGEAL ECHOCARDIOGRAM (TEE);  Surgeon: Lelon Perla, MD;  Location: [facility];  Service: Cardiovascular;  Laterality: N/A;   Family History  Problem Relation Age of Onset  . Coronary artery disease Mother   . Cancer Father        lung  . Stroke Father   . Cancer Brother        lung  . Stroke Sister    Social History   Socioeconomic History  . Marital status: Married    Spouse name: Not on file  . Number of children: Not on file  . Years of education: Not on file  . Highest education level: Not on file  Occupational History  . 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 Financial resource strain: Not on file  . Food insecurity:    Worry: Not on file    Inability: Not on file  . Transportation needs:    Medical: Not on file    Non-medical: Not on file  Tobacco Use  . Smoking status: Former Smoker    Packs/day: 0.12    Years: 45.00    Pack years: 5.40    Types: Cigarettes    Last attempt to quit: 11/24/1984    Years since quitting: 32.9  . Smokeless tobacco: Former Systems developer    Types: Snuff  . Tobacco comment: "dipped snuff when I was a boy"  Substance and Sexual Activity  . Alcohol use: No    Alcohol/week: 0.0 oz  . Drug use: No  . Sexual activity: Never  Lifestyle  . Physical activity:    Days per week: Not on file    Minutes per session: Not on file  . Stress: Not on file   Relationships  . Social connections:    Talks on phone: Not on file    Gets together: Not on file    Attends religious service: Not on file    Active member of club or organization: Not on file    Attends meetings of clubs or organizations: Not on file    Relationship status: Not on file  Other Topics Concern  . Not on file  Social History Narrative  . Not on file   Tobacco  Counseling Counseling given: No Comment: "dipped snuff when I was a boy"   Clinical Intake:     Pain Score: 2                  Activities of Daily Living In your present state of health, do you have any difficulty performing the following activities: 11/08/2017 04/21/2017  Hearing? Y Y  Comment Has slight decreased hearing.  Seen by Dr. Hilarie Fredrickson every 6 months, he would like him to try hearing aid for 30 days at next visit -  Vision? N N  Difficulty concentrating or making decisions? N N  Walking or climbing stairs? Tempie Donning  Comment difficulty with climbing stairs due to right knee arthritis -  Dressing or bathing? N N  Doing errands, shopping? N N  Preparing Food and eating ? N -  Using the Toilet? N -  In the past six months, have you accidently leaked urine? N -  Do you have problems with loss of bowel control? N -  Managing your Medications? N -  Managing your Finances? N -  Housekeeping or managing your Housekeeping? 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 Influenza, High Dose Seasonal PF 06/10/2016  . Influenza,inj,Quad PF,6+ Mos 08/15/2013, 05/02/2014, 05/23/2015, 05/18/2017  . Pneumococcal Conjugate-13 08/15/2013  . Pneumococcal Polysaccharide-23 04/22/2015  . Zoster 09/13/2013   Health Maintenance Due  Topic Date Due  . OPHTHALMOLOGY EXAM  03/20/2016   Patient had eye exam 03/2017 at [place].  Called and requested a copy of results  to be faxed to our office.   Patient Care Team: Timmothy Euler, MD as PCP - General (Family Medicine) Lavonna Monarch, MD as Consulting Physician (Dermatology) Evlyn Courier NP - (Neurology) Clinger, Jenny Reichmann- (Otolaryngology)  Bethann Goo (Cardiology)       Assessment:   This is a routine wellness examination for [place].  Hearing/Vision screen Patient states he notices a hearing deficit at times especially when it is time for him to have his ears irrigated.  He sees Dr. Anner Crete ENT every 6 months and was told at his last visit by Dr. Wendie Chess that he would like him to try hearing aids for 1 month at next visit.   He is up to date on his eye exam.  Requested a copy from [facility].  He states he sees fairly well with glasses.   Dietary issues and exercise activities discussed: Current Exercise Habits: Home exercise routine, Type of exercise: stretching, Time (Minutes): 10, Frequency (Times/Week): 3, Weekly Exercise (Minutes/Week): 30, Intensity: Mild, Exercise limited by: neurologic condition(s);orthopedic condition(s);respiratory conditions(s)  Patient states he usually eats 3 meals per day and snacks as needed. Usually has oatmeal for breakfast, a sandwich for lunch, and meat and vegetables for supper.  He tries to avoid fried foods and sweets as much as possible.  Goals    . DIET - EAT MORE VEGETABLES     Increase vegetable intake to 2-3 servings per day.      Depression Screen PHQ 2/9 Scores 11/08/2017 11/08/2017 08/12/2017 05/18/2017  PHQ - 2 Score 2 2 0 3  PHQ- 9 Score - 11 - 13    Fall Risk Fall Risk  11/08/2017 10/12/2017 08/12/2017 05/18/2017 04/12/2017  Falls in the past year? No No No No No  Number falls in past yr: - - - - -  Injury with Fall? - - - - -    Is the patient's home free of loose throw rugs in walkways, pet beds, electrical cords, etc?   yes      Grab bars in the bathroom? no      Handrails on the stairs?   yes      Adequate lighting?    yes    Cognitive Function: MMSE - Mini Mental State Exam 11/08/2017 04/22/2015  Orientation to time 5 5  Orientation to Place 5 5  Registration 3 3  Attention/ Calculation 3 3  Recall 0 2  Language- name 2 objects 2 2  Language- repeat 1 1  Language- follow 3 step command 3 3  Language- read & follow direction 1 1  Write a sentence 1 1  Copy design 0 0  Total score 24 26        Screening Tests Health Maintenance  Topic Date Due  . OPHTHALMOLOGY EXAM  03/20/2016  . HEMOGLOBIN A1C  02/09/2018  . INFLUENZA VACCINE  03/03/2018  . FOOT EXAM  08/12/2018  . TETANUS/TDAP  04/12/2021  . PNA vac Low Risk Adult  Completed    Qualifies for Shingles Vaccine? Yes, on back order.  Patient added to list to call when vaccine comes in stock.  Cancer Screenings: Lung: Low Dose CT Chest recommended if Age 30-80 years, 30 pack-year currently smoking OR have quit w/in 15years. Patient does qualify. Colorectal: patient states he was told after his last colonoscopy that he would not need additional colonoscopies.        Plan:     Work on increasing your vegetable intake to 2-3 servings per day.  Keep upcoming medical appointments    I have personally reviewed and noted the following in the patient's chart:   . Medical and social history . Use of alcohol, tobacco or illicit drugs  . Current medications and supplements . Functional ability and status . Nutritional status . Physical activity . Advanced directives . List of other physicians . Hospitalizations, surgeries, and ER visits in previous 12 months . Vitals . Screenings to include cognitive, depression, and falls . Referrals and appointments  In addition, I have reviewed and discussed with patient certain preventive protocols, quality metrics, and best practice recommendations. A written personalized care plan for preventive services as well as general preventive health recommendations were provided to patient.      WYATT, AMY M, RN   11/08/2017     I have reviewed and agree with the above AWV documentation.  Note decline in MMSE  Laroy Apple, MD [facility] 11/29/2017, 5:19 PM

## 2017-11-08 NOTE — Patient Instructions (Signed)
Please work on increasing your vegetable intake to 2-3 servings per day.   Thank you for coming in for your Annual Wellness Visit today!!    Preventive Care 65 Years and Older, Male Preventive care refers to lifestyle choices and visits with your health care provider that can promote health and wellness. What does preventive care include?  A yearly physical exam. This is also called an annual well check.  Dental exams once or twice a year.  Routine eye exams. Ask your health care provider how often you should have your eyes checked.  Personal lifestyle choices, including: ? Daily care of your teeth and gums. ? Regular physical activity. ? Eating a healthy diet. ? Avoiding tobacco and drug use. ? Limiting alcohol use. ? Practicing safe sex. ? Taking low doses of aspirin every day. ? Taking vitamin and mineral supplements as recommended by your health care provider. What happens during an annual well check? The services and screenings done by your health care provider during your annual well check will depend on your age, overall health, lifestyle risk factors, and family history of disease. Counseling Your health care provider may ask you questions about your:  Alcohol use.  Tobacco use.  Drug use.  Emotional well-being.  Home and relationship well-being.  Sexual activity.  Eating habits.  History of falls.  Memory and ability to understand (cognition).  Work and work Statistician.  Screening You may have the following tests or measurements:  Height, weight, and BMI.  Blood pressure.  Lipid and cholesterol levels. These may be checked every 5 years, or more frequently if you are over 27 years old.  Skin check.  Lung cancer screening. You may have this screening every year starting at age 20 if you have a 30-pack-year history of smoking and currently smoke or have quit within the past 15 years.  Fecal occult blood test (FOBT) of the stool. You may have this  test every year starting at age 29.  Flexible sigmoidoscopy or colonoscopy. You may have a sigmoidoscopy every 5 years or a colonoscopy every 10 years starting at age 44.  Prostate cancer screening. Recommendations will vary depending on your family history and other risks.  Hepatitis C blood test.  Hepatitis B blood test.  Sexually transmitted disease (STD) testing.  Diabetes screening. This is done by checking your blood sugar (glucose) after you have not eaten for a while (fasting). You may have this done every 1-3 years.  Abdominal aortic aneurysm (AAA) screening. You may need this if you are a current or former smoker.  Osteoporosis. You may be screened starting at age 2 if you are at high risk.  Talk with your health care provider about your test results, treatment options, and if necessary, the need for more tests. Vaccines Your health care provider may recommend certain vaccines, such as:  Influenza vaccine. This is recommended every year.  Tetanus, diphtheria, and acellular pertussis (Tdap, Td) vaccine. You may need a Td booster every 10 years.  Varicella vaccine. You may need this if you have not been vaccinated.  Zoster vaccine. You may need this after age 52.  Measles, mumps, and rubella (MMR) vaccine. You may need at least one dose of MMR if you were born in 1957 or later. You may also need a second dose.  Pneumococcal 13-valent conjugate (PCV13) vaccine. One dose is recommended after age 35.  Pneumococcal polysaccharide (PPSV23) vaccine. One dose is recommended after age 73.  Meningococcal vaccine. You may need this  if you have certain conditions.  Hepatitis A vaccine. You may need this if you have certain conditions or if you travel or work in places where you may be exposed to hepatitis A.  Hepatitis B vaccine. You may need this if you have certain conditions or if you travel or work in places where you may be exposed to hepatitis B.  Haemophilus influenzae  type b (Hib) vaccine. You may need this if you have certain risk factors.  Talk to your health care provider about which screenings and vaccines you need and how often you need them. This information is not intended to replace advice given to you by your health care provider. Make sure you discuss any questions you have with your health care provider. Document Released: 08/16/2015 Document Revised: 04/08/2016 Document Reviewed: 05/21/2015 Elsevier Interactive Patient Education  [employer].

## 2017-11-16 ENCOUNTER — Ambulatory Visit (INDEPENDENT_AMBULATORY_CARE_PROVIDER_SITE_OTHER): Payer: Medicare Other | Admitting: *Deleted

## 2017-11-16 DIAGNOSIS — I639 Cerebral infarction, unspecified: Secondary | ICD-10-CM | POA: Diagnosis not present

## 2017-11-17 NOTE — Progress Notes (Signed)
Carelink Summary Report / Loop Recorder 

## 2017-11-18 DIAGNOSIS — D0461 Carcinoma in situ of skin of right upper limb, including shoulder: Secondary | ICD-10-CM | POA: Diagnosis not present

## 2017-11-18 DIAGNOSIS — L570 Actinic keratosis: Secondary | ICD-10-CM | POA: Diagnosis not present

## 2017-11-22 LAB — CUP PACEART REMOTE DEVICE CHECK
Date Time Interrogation Session: 20190315013630
MDC IDC PG IMPLANT DT: 20180515

## 2017-11-29 ENCOUNTER — Other Ambulatory Visit: Payer: Self-pay | Admitting: Cardiology

## 2017-11-29 NOTE — Telephone Encounter (Signed)
REFILL 

## 2017-12-07 ENCOUNTER — Other Ambulatory Visit: Payer: Self-pay | Admitting: *Deleted

## 2017-12-07 MED ORDER — PANTOPRAZOLE SODIUM 40 MG PO TBEC: 40.0000 mg | DELAYED_RELEASE_TABLET | Freq: Every day | ORAL | 0 refills | Status: DC

## 2017-12-10 ENCOUNTER — Encounter: Payer: Self-pay | Admitting: Family Medicine

## 2017-12-10 ENCOUNTER — Ambulatory Visit (INDEPENDENT_AMBULATORY_CARE_PROVIDER_SITE_OTHER): Payer: Medicare Other | Admitting: Family Medicine

## 2017-12-10 VITALS — BP 146/80 | HR 63 | Temp 96.8°F | Ht 67.0 in | Wt 192.4 lb

## 2017-12-10 DIAGNOSIS — I10 Essential (primary) hypertension: Secondary | ICD-10-CM

## 2017-12-10 DIAGNOSIS — E1159 Type 2 diabetes mellitus with other circulatory complications: Secondary | ICD-10-CM

## 2017-12-10 LAB — CMP14+EGFR
A/G RATIO: 2.2 (ref 1.2–2.2)
ALT: 15 IU/L (ref 0–44)
AST: 18 IU/L (ref 0–40)
Albumin: 4.4 g/dL (ref 3.5–4.7)
Alkaline Phosphatase: 76 IU/L (ref 39–117)
BUN/Creatinine Ratio: 8 — ABNORMAL LOW (ref 10–24)
BUN: 8 mg/dL (ref 8–27)
Bilirubin Total: 0.8 mg/dL (ref 0.0–1.2)
CALCIUM: 8.8 mg/dL (ref 8.6–10.2)
CO2: 23 mmol/L (ref 20–29)
Chloride: 97 mmol/L (ref 96–106)
Creatinine, Ser: 1.06 mg/dL (ref 0.76–1.27)
GFR, EST AFRICAN AMERICAN: 74 mL/min/{1.73_m2} (ref >59)
GFR, EST NON AFRICAN AMERICAN: 64 mL/min/{1.73_m2} (ref >59)
GLOBULIN, TOTAL: 2 g/dL (ref 1.5–4.5)
Glucose: 138 mg/dL — ABNORMAL HIGH (ref 65–99)
Potassium: 4 mmol/L (ref 3.5–5.2)
SODIUM: 137 mmol/L (ref 134–144)
TOTAL PROTEIN: 6.4 g/dL (ref 6.0–8.5)

## 2017-12-10 LAB — BAYER DCA HB A1C WAIVED: HB A1C (BAYER DCA - WAIVED): 5.8 % (ref <7.0)

## 2017-12-10 MED ORDER — METFORMIN HCL ER 500 MG PO TB24: 500.0000 mg | ORAL_TABLET | Freq: Two times a day (BID) | ORAL | 2 refills | Status: DC

## 2017-12-10 MED ORDER — BENAZEPRIL HCL 20 MG PO TABS: 20.0000 mg | ORAL_TABLET | Freq: Every day | ORAL | 3 refills | Status: DC

## 2017-12-10 MED ORDER — ALBUTEROL SULFATE HFA 108 (90 BASE) MCG/ACT IN AERS: INHALATION_SPRAY | RESPIRATORY_TRACT | 2 refills | Status: DC

## 2017-12-10 MED ORDER — METOPROLOL TARTRATE 100 MG PO TABS: 100.0000 mg | ORAL_TABLET | Freq: Two times a day (BID) | ORAL | 2 refills | Status: DC

## 2017-12-10 NOTE — Progress Notes (Signed)
   HPI  Patient presents today for follow-up chronic medical conditions.  T2DM Good med comliance and tolerance, avg fasting is 120-140.  Watching diet  HTN Good med compliance, had to use hydralazine for elelvated BP last month which was effective.   PMH: Smoking status noted ROS: Per HPI  Objective: BP (!) 146/80   Pulse 63   Temp (!) 96.8 F (36 C) (Oral)   Ht '5\' 7"'$  (1.702 m)   Wt 192 lb 6.4 oz (87.3 kg)   BMI 30.13 kg/m  Gen: NAD, alert, cooperative with exam HEENT: NCAT CV: RRR, good S1/S2, no murmur Resp: CTABL, no wheezes, non-labored Ext: No edema, warm Neuro: Alert and oriented, No gross deficits  Assessment and plan:  # T2DM Controlled, no changes, LAbs A1C 5.8  # HTN Slightly elevated but overall controlled, no changes.    Orders Placed This Encounter  Procedures  . Microalbumin / creatinine urine ratio  . Bayer DCA Hb A1c Waived  . CMP14+EGFR    Meds ordered this encounter  Medications  . benazepril (LOTENSIN) 20 MG tablet    Sig: Take 1 tablet (20 mg total) by mouth daily.    Dispense:  90 tablet    Refill:  3  . albuterol (PROVENTIL HFA;VENTOLIN HFA) 108 (90 Base) MCG/ACT inhaler    Sig: INHALE 2 PUFFS BY MOUTH 4 TIMES DAILY    Dispense:  27 each    Refill:  2    Please consider 90 day supplies to promote better adherence  . metFORMIN (GLUCOPHAGE-XR) 500 MG 24 hr tablet    Sig: Take 1 tablet (500 mg total) by mouth 2 (two) times daily.    Dispense:  180 tablet    Refill:  2  . metoprolol tartrate (LOPRESSOR) 100 MG tablet    Sig: Take 1 tablet (100 mg total) by mouth 2 (two) times daily.    Dispense:  180 tablet    Refill:  [place], MD [place] 12/10/2017, 9:52 AM

## 2017-12-10 NOTE — Patient Instructions (Signed)
Great to see you!  Come back to see Dr. Lajuana Ripple or Dr. Warrick Parisian in 4 months

## 2017-12-11 LAB — MICROALBUMIN / CREATININE URINE RATIO
Creatinine, Urine: 108.5 mg/dL
Microalb/Creat Ratio: 8.7 mg/g creat (ref 0.0–30.0)
Microalbumin, Urine: 9.4 ug/mL

## 2017-12-20 ENCOUNTER — Ambulatory Visit (INDEPENDENT_AMBULATORY_CARE_PROVIDER_SITE_OTHER): Payer: Medicare Other | Admitting: *Deleted

## 2017-12-20 DIAGNOSIS — I639 Cerebral infarction, unspecified: Secondary | ICD-10-CM | POA: Diagnosis not present

## 2017-12-20 LAB — CUP PACEART REMOTE DEVICE CHECK
Implantable Pulse Generator Implant Date: 20180515
MDC IDC SESS DTM: 20190417013934

## 2017-12-20 NOTE — Progress Notes (Signed)
Carelink Summary Report / Loop Recorder 

## 2017-12-21 DIAGNOSIS — L84 Corns and callosities: Secondary | ICD-10-CM | POA: Diagnosis not present

## 2017-12-21 DIAGNOSIS — M79676 Pain in unspecified toe(s): Secondary | ICD-10-CM | POA: Diagnosis not present

## 2017-12-21 DIAGNOSIS — E1142 Type 2 diabetes mellitus with diabetic polyneuropathy: Secondary | ICD-10-CM | POA: Diagnosis not present

## 2017-12-21 DIAGNOSIS — B351 Tinea unguium: Secondary | ICD-10-CM | POA: Diagnosis not present

## 2017-12-23 DIAGNOSIS — C44329 Squamous cell carcinoma of skin of other parts of face: Secondary | ICD-10-CM | POA: Diagnosis not present

## 2017-12-23 DIAGNOSIS — C44319 Basal cell carcinoma of skin of other parts of face: Secondary | ICD-10-CM | POA: Diagnosis not present

## 2017-12-29 ENCOUNTER — Telehealth: Payer: Self-pay | Admitting: Cardiology

## 2017-12-29 NOTE — Telephone Encounter (Signed)
LVM for call back

## 2017-12-29 NOTE — Telephone Encounter (Signed)
New Message     1. Has your device fired? no  2. Is you device beeping? no  3. Are you experiencing draining or swelling at device site? no  4. Are you calling to see if we received your device transmission? no  5. Have you passed out? No  Patients wife is calling to get assistant on how to send the remote transmission.     Please route to [place]

## 2017-12-29 NOTE — Telephone Encounter (Signed)
LMOVM requesting that pt send manual transmission b/c home monitor has not updated in at least 14 days.    

## 2017-12-29 NOTE — Telephone Encounter (Signed)
I am not sure why this was routed to the pre-op pool. Will forward to device clinic.

## 2017-12-31 NOTE — Telephone Encounter (Signed)
Patient/wife returned call. Garlon Hatchet, CMA instructed patient on how to send remote transmission. Transmission received. Nothing further is needed at this time.

## 2017-12-31 NOTE — Telephone Encounter (Signed)
Called to instruct wife on how to send remote transmission. Wife states that husband is not home at the moment. Wife will call back once husband returns.

## 2018-01-10 LAB — CUP PACEART REMOTE DEVICE CHECK
Date Time Interrogation Session: 20190520020659
Implantable Pulse Generator Implant Date: 20180515

## 2018-01-19 ENCOUNTER — Telehealth: Payer: Self-pay | Admitting: Cardiology

## 2018-01-19 ENCOUNTER — Telehealth: Payer: Self-pay | Admitting: *Deleted

## 2018-01-19 DIAGNOSIS — I480 Paroxysmal atrial fibrillation: Secondary | ICD-10-CM

## 2018-01-19 MED ORDER — RIVAROXABAN 20 MG PO TABS: 20.0000 mg | ORAL_TABLET | Freq: Every day | ORAL | 10 refills | Status: DC

## 2018-01-19 MED ORDER — RIVAROXABAN 20 MG PO TABS
20.0000 mg | ORAL_TABLET | Freq: Every day | ORAL | 0 refills | Status: DC
Start: 2018-01-19 — End: 2018-01-25

## 2018-01-19 NOTE — Telephone Encounter (Signed)
Patient's wife called back--wants to make sure patient should continue on atorvastatin, metoprolol tartrate, and hydralazine (PRN).  Advised that patient should continue all of these medications.  Patient's wife appreciative and denies additional questions at this time.

## 2018-01-19 NOTE — Telephone Encounter (Signed)
Patient's wife called back.  Xarelto will cost him $45/month, which he can afford.  Patient and wife went to the hospital in [place], rather than the [place] office in [place], so they were unable to pick up the samples.  They are on their way to [facility] now to pick up the prescription.  Will alert Stacy, RN, that patient does not have a 30-day trial card, so that he can obtain one at upcoming AF clinic appointment.  Patient's wife recalled that patient is taking ASA 81mg  daily (was not on med list).  Advised her to STOP both the ASA and the Plavix per Dr. Rayann Heman.  Patient's wife verbalizes understanding.  He will plan to start taking Xarelto tomorrow.  Patient's wife agrees to call back with any questions or concerns.  Confirmed with pharmacy that prescription is ready for pickup.

## 2018-01-19 NOTE — Telephone Encounter (Signed)
New A-fib noted on manual transmission received today (due to disconnected monitor).  Episode occurred on 01/12/18 at 1758, duration 8hr 25min.  Per Dr. Rayann Heman, STOP Plavix and START Xarelto 20mg  daily with supper.  Plan for AF Clinic OV next week.    Spoke with patient.  He is aware of recommendation and is agreeable to plan.  Confirmed pharmacy. He is about 22min from both [place] and [facility] offices, so will check with PCP office for samples.    [place] office to see if they have samples--none available per triage RN.  Called back and spoke with patient's wife per his preference.  She will try calling pharmacy to find out Xarelto cost, then will plan to go to [place] office for free trial card and samples if cost is too much.  Reiterated medication instructions and side effects (bleeding) to patient's wife.  She verbalizes understanding.  Plan for AF Clinic appointment on 01/25/18 at 9:00am.  Phone number, address, and parking code given to patient's wife.  Samples and trial card will be available at [facility] front desk for pickup.  Prescriptions sent electronically to patient's preferred pharmacy.

## 2018-01-19 NOTE — Telephone Encounter (Signed)
Spoke w/ pt and requested that he send a manual transmission b/c his home monitor has not updated in at least 14 days.   

## 2018-01-19 NOTE — Telephone Encounter (Signed)
Agree 

## 2018-01-20 NOTE — Telephone Encounter (Signed)
Error in charting.

## 2018-01-21 ENCOUNTER — Ambulatory Visit (INDEPENDENT_AMBULATORY_CARE_PROVIDER_SITE_OTHER): Payer: Medicare Other | Admitting: *Deleted

## 2018-01-21 DIAGNOSIS — I639 Cerebral infarction, unspecified: Secondary | ICD-10-CM

## 2018-01-21 NOTE — Progress Notes (Signed)
Carelink Summary Report / Loop Recorder 

## 2018-01-24 ENCOUNTER — Ambulatory Visit: Payer: Medicare Other | Admitting: *Deleted

## 2018-01-24 DIAGNOSIS — I639 Cerebral infarction, unspecified: Secondary | ICD-10-CM | POA: Diagnosis not present

## 2018-01-24 NOTE — Progress Notes (Signed)
Carelink Summary Report / Loop Recorder 

## 2018-01-25 ENCOUNTER — Encounter (HOSPITAL_COMMUNITY): Payer: Self-pay | Admitting: Nurse Practitioner

## 2018-01-25 ENCOUNTER — Ambulatory Visit (HOSPITAL_COMMUNITY)
Admission: RE | Admit: 2018-01-25 | Discharge: 2018-01-25 | Disposition: A | Discharge status: Home / Self Care | Payer: Medicare Other | Source: Ambulatory Visit | Attending: Nurse Practitioner | Admitting: Nurse Practitioner

## 2018-01-25 DIAGNOSIS — M545 Low back pain: Secondary | ICD-10-CM | POA: Insufficient documentation

## 2018-01-25 DIAGNOSIS — I672 Cerebral atherosclerosis: Secondary | ICD-10-CM | POA: Insufficient documentation

## 2018-01-25 DIAGNOSIS — K449 Diaphragmatic hernia without obstruction or gangrene: Secondary | ICD-10-CM | POA: Insufficient documentation

## 2018-01-25 DIAGNOSIS — I10 Essential (primary) hypertension: Secondary | ICD-10-CM | POA: Diagnosis not present

## 2018-01-25 DIAGNOSIS — R9431 Abnormal electrocardiogram [ECG] [EKG]: Secondary | ICD-10-CM | POA: Insufficient documentation

## 2018-01-25 DIAGNOSIS — J45909 Unspecified asthma, uncomplicated: Secondary | ICD-10-CM | POA: Insufficient documentation

## 2018-01-25 DIAGNOSIS — Z87891 Personal history of nicotine dependence: Secondary | ICD-10-CM | POA: Insufficient documentation

## 2018-01-25 DIAGNOSIS — E119 Type 2 diabetes mellitus without complications: Secondary | ICD-10-CM | POA: Insufficient documentation

## 2018-01-25 DIAGNOSIS — K219 Gastro-esophageal reflux disease without esophagitis: Secondary | ICD-10-CM | POA: Insufficient documentation

## 2018-01-25 DIAGNOSIS — I480 Paroxysmal atrial fibrillation: Secondary | ICD-10-CM | POA: Diagnosis not present

## 2018-01-25 DIAGNOSIS — Z8673 Personal history of transient ischemic attack (TIA), and cerebral infarction without residual deficits: Secondary | ICD-10-CM | POA: Insufficient documentation

## 2018-01-25 DIAGNOSIS — Z7984 Long term (current) use of oral hypoglycemic drugs: Secondary | ICD-10-CM | POA: Diagnosis not present

## 2018-01-25 DIAGNOSIS — I252 Old myocardial infarction: Secondary | ICD-10-CM | POA: Diagnosis not present

## 2018-01-25 DIAGNOSIS — Z7901 Long term (current) use of anticoagulants: Secondary | ICD-10-CM | POA: Insufficient documentation

## 2018-01-25 DIAGNOSIS — E785 Hyperlipidemia, unspecified: Secondary | ICD-10-CM | POA: Diagnosis not present

## 2018-01-25 DIAGNOSIS — I2510 Atherosclerotic heart disease of native coronary artery without angina pectoris: Secondary | ICD-10-CM | POA: Insufficient documentation

## 2018-01-25 DIAGNOSIS — Z79899 Other long term (current) drug therapy: Secondary | ICD-10-CM | POA: Diagnosis not present

## 2018-01-25 DIAGNOSIS — G8929 Other chronic pain: Secondary | ICD-10-CM | POA: Diagnosis not present

## 2018-01-25 MED ORDER — RIVAROXABAN 20 MG PO TABS: 20.0000 mg | ORAL_TABLET | Freq: Every day | ORAL | 0 refills | Status: DC

## 2018-01-25 NOTE — Progress Notes (Signed)
Primary Care Physician: Timmothy Euler, MD Referring Physician: Dr. Rayann Heman Cardiologist: Dr. Laural Roes is a 84 y.o. male with a h/o cerebral atherosclerosis, CAD, hyperlipidemia, hypertension, iliac artery aneurysm, MI, prior strokes (2003, 2013, 2016, 2017), TIA, and DM2.He had a loop recorder placed after TIA in May of 2018. It recently showed Afib 01/12/18 for 8 hour duration of which pt was not aware. V rate 85 bpm, with pt already on metoprolol tartrate 100 mg bid. Dr. Rayann Heman stopped ASA and Plavix and started xarelto  20 mg daily. He is in afib clinic for f/u. He is tolerating xarelto without any bleeding issues. BP initially elevated at 170/82 but rechecked at 158/88.  Today, he denies symptoms of palpitations, chest pain, shortness of breath, orthopnea, PND, lower extremity edema, dizziness, presyncope, syncope, or neurologic sequela. The patient is tolerating medications without difficulties and is otherwise without complaint today.   Past Medical History:  Diagnosis Date  . Arthritis    "wrists, knees, back" (12/10/2015)  . Asthma   . Cerebral atherosclerosis   . Chronic back pain    "lower primarily" (12/10/2015)  . CORONARY ATHEROSCLEROSIS NATIVE CORONARY ARTERY    Myocardial infarction at [facility] in 1996 with a directional atherectomy of the 90% LAD stenosis and medical management of 60-70% proximal circumflex and 50% RCA stenosis.  . DYSLIPIDEMIA   . GERD (gastroesophageal reflux disease)   . History of hiatal hernia   . HYPERTENSION   . ILIAC ARTERY ANEURYSM   . Myocardial infarction ([place]) 1996  . Stroke [facility]) 2003; 2013; 07/2015; 12/05/2015   denies residual on 12/10/2015, 12/13/16, 01/08/17  . TIA (transient ischemic attack)   . Type II diabetes mellitus ([place])   . UPPER RESPIRATORY INFECTION    Past Surgical History:  Procedure Laterality Date  . ANAL FISSURE REPAIR  1990s  . BASAL CELL CARCINOMA EXCISION Right 12/2015   "temple"  . CATARACT  EXTRACTION W/ INTRAOCULAR LENS  IMPLANT, BILATERAL Bilateral 2000s  . CORONARY ANGIOPLASTY  1996  . KNEE CARTILAGE SURGERY Right 1970s  . LOOP RECORDER INSERTION N/A 12/15/2016   Procedure: Loop Recorder Insertion;  Surgeon: Thompson Grayer, MD;  Location: [place] CV LAB;  Service: Cardiovascular;  Laterality: N/A;  . SHOULDER ARTHROSCOPY W/ ROTATOR CUFF REPAIR Left 2000s  . TEE WITHOUT CARDIOVERSION N/A 12/15/2016   Procedure: TRANSESOPHAGEAL ECHOCARDIOGRAM (TEE);  Surgeon: Lelon Perla, MD;  Location: [facility] ENDOSCOPY;  Service: Cardiovascular;  Laterality: N/A;    Current Outpatient Medications  Medication Sig Dispense Refill  . albuterol (PROVENTIL HFA;VENTOLIN HFA) 108 (90 Base) MCG/ACT inhaler Inhale into the lungs every 6 (six) hours as needed for wheezing or shortness of breath.    Marland Kitchen atorvastatin (LIPITOR) 80 MG tablet Take 1 tablet (80 mg total) by mouth daily at 6 PM. 90 tablet 3  . BAYER CONTOUR TEST test strip USE ONE STRIP TO CHECK GLUCOSE TWICE DAILY AND  AS  NEEDED 100 each 11  . benazepril (LOTENSIN) 20 MG tablet Take 1 tablet (20 mg total) by mouth daily. 90 tablet 3  . Cholecalciferol (VITAMIN D) 2000 units CAPS Take 2,000 Units by mouth daily.    . feeding supplement, GLUCERNA SHAKE, (GLUCERNA SHAKE) LIQD Take 237 mLs by mouth 2 (two) times daily between meals. 60 Can 11  . fish oil-omega-3 fatty acids 1000 MG capsule Take 2 g by mouth every evening.     . Fluticasone-Salmeterol (ADVAIR DISKUS) 100-50 MCG/DOSE AEPB INHALE ONE DOSE BY  MOUTH EVERY 12 HOURS 180 each 2  . hydrALAZINE (APRESOLINE) 25 MG tablet TAKE ONE TABLET BY MOUTH ONCE DAILY AS NEEDED 90 tablet 0  . hydrochlorothiazide (HYDRODIURIL) 25 MG tablet TAKE ONE-HALF TABLET BY MOUTH ONCE DAILY EXCEPT  ON  MONDAY,  WEDNESDAY,  AND  FRIDAY  TAKE  A  WHOLE  TABLET 90 tablet 3  . metFORMIN (GLUCOPHAGE-XR) 500 MG 24 hr tablet Take 1 tablet (500 mg total) by mouth 2 (two) times daily. 180 tablet 2  . metoprolol tartrate  (LOPRESSOR) 100 MG tablet Take 1 tablet (100 mg total) by mouth 2 (two) times daily. 180 tablet 2  . nitroGLYCERIN (NITROSTAT) 0.4 MG SL tablet USE AS DIRECTED 25 tablet 1  . pantoprazole (PROTONIX) 40 MG tablet Take 1 tablet (40 mg total) by mouth daily. 90 tablet 0  . rivaroxaban (XARELTO) 20 MG TABS tablet Take 1 tablet (20 mg total) by mouth daily with supper. 30 tablet 0   No current facility-administered medications for this encounter.     Allergies  Allergen Reactions  . Alteplase Anaphylaxis  . Codeine Other (See Comments)    Bad dreams  . Lopid [Gemfibrozil] Nausea Only  . Voltaren [Diclofenac Sodium] Diarrhea    Social History   Socioeconomic History  . Marital status: Married    Spouse name: Not on file  . Number of children: Not on file  . Years of education: Not on file  . Highest education level: Not on file  Occupational History  . 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 Financial resource strain: Not on file  . Food insecurity:    Worry: Not on file    Inability: Not on file  . Transportation needs:    Medical: Not on file    Non-medical: Not on file  Tobacco Use  . Smoking status: Former Smoker    Packs/day: 0.12    Years: 45.00    Pack years: 5.40    Types: Cigarettes    Last attempt to quit: 11/24/1984    Years since quitting: 33.1  . Smokeless tobacco: Former Systems developer    Types: Snuff  . Tobacco comment: "dipped snuff when I was a boy"  Substance and Sexual Activity  . Alcohol use: No    Alcohol/week: 0.0 oz  . Drug use: No  . Sexual activity: Never  Lifestyle  . Physical activity:    Days per week: Not on file    Minutes per session: Not on file  . Stress: Not on file  Relationships  . Social connections:    Talks on phone: Not on file    Gets together: Not on file    Attends religious service: Not on file    Active member of club or organization: Not on file    Attends meetings of clubs or organizations: Not on file     Relationship status: Not on file  . Intimate partner violence:    Fear of current or ex partner: Not on file    Emotionally abused: Not on file    Physically abused: Not on file    Forced sexual activity: Not on file  Other Topics Concern  . Not on file  Social History Narrative  . Not on file    Family History  Problem Relation Age of Onset  . Coronary artery disease Mother   . Cancer Father        lung  . Stroke Father   .  Cancer Brother        lung  . Stroke Sister     ROS- All systems are reviewed and negative except as per the HPI above  Physical Exam: Vitals:   01/25/18 0903  BP: (!) 170/82  Pulse: 69  Weight: 193 lb 12.8 oz (87.9 kg)  Height: 5\' 11"  (1.803 m)   Wt Readings from Last 3 Encounters:  01/25/18 193 lb 12.8 oz (87.9 kg)  12/10/17 192 lb 6.4 oz (87.3 kg)  11/08/17 194 lb (88 kg)    Labs: Lab Results  Component Value Date   NA 137 12/10/2017   K 4.0 12/10/2017   CL 97 12/10/2017   CO2 23 12/10/2017   GLUCOSE 138 (H) 12/10/2017   BUN 8 12/10/2017   CREATININE 1.06 12/10/2017   CALCIUM 8.8 12/10/2017   MG 1.6 (L) 12/14/2016   Lab Results  Component Value Date   INR 1.01 04/21/2017   Lab Results  Component Value Date   CHOL 109 04/22/2017   HDL 41 04/22/2017   LDLCALC 56 04/22/2017   TRIG 62 04/22/2017     GEN- The patient is well appearing, alert and oriented x 3 today.   Head- normocephalic, atraumatic Eyes-  Sclera clear, conjunctiva pink Ears- hearing intact Oropharynx- clear Neck- supple, no JVP Lymph- no cervical lymphadenopathy Lungs- Clear to ausculation bilaterally, normal work of breathing Heart- Regular rate and rhythm, no murmurs, rubs or gallops, PMI not laterally displaced GI- soft, NT, ND, + BS Extremities- no clubbing, cyanosis, or edema MS- no significant deformity or atrophy Skin- no rash or lesion Psych- euthymic mood, full affect Neuro- strength and sensation are intact  EKG- NSR at 69 bpm, pr int  186 ms, qrs int 78 ms, qtc 415 ms Epic records reviewed Reveal LInq report of afib reviewed    Assessment and Plan: 1. Paroxysmal  afib General education re afib, risk factors and triggers reviewed Recently found on Linq at which time pt was asymptomatic Continue metoprolol at current dose without change as v rate was controlled during episode Continue remote checks  2.CHA2DS2VASc score of at least 7 Continue xarelto 20 mg daily with Crcl cal at 63.09 Bleeding precautions discussed Off Asa and  Plavix  F/u with Dr. Percival Spanish 7/24 at which time remote check is also  scheduled early in day  [place]. Daphanie Oquendo, [facility] [address] [place], [place] [address] [phone]

## 2018-01-28 ENCOUNTER — Telehealth: Payer: Self-pay

## 2018-01-28 NOTE — Telephone Encounter (Signed)
Pt wife called to update [place] about pt condition. He was in the hospital. I let pt talk to device tech nurse.

## 2018-01-28 NOTE — Telephone Encounter (Signed)
Spoke with pt's wife who reported that she was told that if pt has trouble with Xarelto to call office, pt wife reported that this afternoon pt reported numbness in his right hand and looked like to her that the side of his mouth drooped just slightly but that no one but her would notice it because of ever how slight it was. She stated that these symptoms lasted about 5 minutes and resolved. She also reported that his BP was systolic 659 pts wife stated that she called the EMS and they came out and evaluated him and that he refused to go to the hospital. Informed her that this symptoms could be a stroke she voiced understanding that stated that pt would not go to the hospital. Pts wife stated that she gave pt 25mg  of apresoline for his BP which when she re-checked it was 197/98 she stated that she had given the medication about 45 minutes ago. Informed pts wife that if pts BP did not come down to 935-701 systolic that he needed to go be evaluated and that if those other symptoms of numbness or drooping of his mouth occurred again he needed to go to the ED immediatly to evaluated. Pts wife voiced understanding. Informed her that I would route this information to pts providers

## 2018-01-29 NOTE — Telephone Encounter (Signed)
Correct instructions given to the patient.  He can follow up in our office for his BP but should present the the ED with any acute events.  Please arrange office follow up if his BP is remaining high at home.

## 2018-01-31 NOTE — Telephone Encounter (Signed)
Agree with documentation by nursing and Dr. Percival Spanish.   Appreciate their management.   Laroy Apple, MD [facility] 01/31/2018, 8:47 AM

## 2018-02-01 NOTE — Telephone Encounter (Signed)
Pt have appt with Dr Percival Spanish on 07/24 in [place]

## 2018-02-17 ENCOUNTER — Other Ambulatory Visit: Payer: Self-pay | Admitting: Internal Medicine

## 2018-02-17 DIAGNOSIS — H918X1 Other specified hearing loss, right ear: Secondary | ICD-10-CM | POA: Diagnosis not present

## 2018-02-17 DIAGNOSIS — J328 Other chronic sinusitis: Secondary | ICD-10-CM | POA: Diagnosis not present

## 2018-02-17 DIAGNOSIS — J329 Chronic sinusitis, unspecified: Secondary | ICD-10-CM | POA: Diagnosis not present

## 2018-02-17 DIAGNOSIS — J343 Hypertrophy of nasal turbinates: Secondary | ICD-10-CM | POA: Diagnosis not present

## 2018-02-17 DIAGNOSIS — H6123 Impacted cerumen, bilateral: Secondary | ICD-10-CM | POA: Diagnosis not present

## 2018-02-17 DIAGNOSIS — J342 Deviated nasal septum: Secondary | ICD-10-CM | POA: Diagnosis not present

## 2018-02-22 NOTE — Progress Notes (Signed)
HPI The patient presents for followup of his known coronary disease.  He was hospitalized earlier last year with a TIA.  I reviewed these records for this visit.    He had a loop recorder placed.  He did have a TEE demonstrating a small PFO. Doppler was negative for DVT.  MRI demonstrated small acute infarcts in the MCA distribution. He had an implanted loop monitor.  He was found to have atrial fib and was started on Xarelto.  His wife called the office last month reporting that he had facial drooping and numbness in his body and he was hypertensive. He was instructed to go to the ED but he did not present to the ED.  He returns for follow up.    Today his blood pressure went up and he felt the patient will be called EMS and they checked him out with his symptoms did resolve and he chose not to go to the emergency room.  He took hydralazine.  He rarely uses that.  He since has done well.  He never felt the atrial fibrillation when he was in it.  He is off his aspirin and his Plavix and is now on the Xarelto.  He feels well. The patient denies any new symptoms such as chest discomfort, neck or arm discomfort. There has been no new shortness of breath, PND or orthopnea. There have been no reported palpitations, presyncope or syncope.   He does some activities around the house but he does not try the exercise bicycle that he has.    Allergies  Allergen Reactions  . Alteplase Anaphylaxis  . Codeine Other (See Comments)    Bad dreams  . Lopid [Gemfibrozil] Nausea Only  . Voltaren [Diclofenac Sodium] Diarrhea    Current Outpatient Medications  Medication Sig Dispense Refill  . albuterol (PROVENTIL HFA;VENTOLIN HFA) 108 (90 Base) MCG/ACT inhaler Inhale into the lungs every 6 (six) hours as needed for wheezing or shortness of breath.    Marland Kitchen atorvastatin (LIPITOR) 80 MG tablet Take 1 tablet (80 mg total) by mouth daily at 6 PM. 90 tablet 3  . benazepril (LOTENSIN) 20 MG tablet Take 1 tablet (20 mg  total) by mouth daily. 90 tablet 3  . Cholecalciferol (VITAMIN D) 2000 units CAPS Take 2,000 Units by mouth daily.    . feeding supplement, GLUCERNA SHAKE, (GLUCERNA SHAKE) LIQD Take 237 mLs by mouth 2 (two) times daily between meals. 60 Can 11  . fish oil-omega-3 fatty acids 1000 MG capsule Take 2 g by mouth every evening.     . Fluticasone-Salmeterol (ADVAIR DISKUS) 100-50 MCG/DOSE AEPB INHALE ONE DOSE BY MOUTH EVERY 12 HOURS 180 each 2  . hydrALAZINE (APRESOLINE) 25 MG tablet TAKE ONE TABLET BY MOUTH ONCE DAILY AS NEEDED 90 tablet 0  . hydrochlorothiazide (HYDRODIURIL) 25 MG tablet TAKE ONE-HALF TABLET BY MOUTH ONCE DAILY EXCEPT  ON  MONDAY,  WEDNESDAY,  AND  FRIDAY  TAKE  A  WHOLE  TABLET 90 tablet 3  . metFORMIN (GLUCOPHAGE-XR) 500 MG 24 hr tablet Take 1 tablet (500 mg total) by mouth 2 (two) times daily. 180 tablet 2  . metoprolol tartrate (LOPRESSOR) 100 MG tablet Take 1 tablet (100 mg total) by mouth 2 (two) times daily. 180 tablet 2  . nitroGLYCERIN (NITROSTAT) 0.4 MG SL tablet USE AS DIRECTED 25 tablet 1  . pantoprazole (PROTONIX) 40 MG tablet Take 1 tablet (40 mg total) by mouth daily. 90 tablet 0  . rivaroxaban (XARELTO) 20 MG  TABS tablet Take 1 tablet (20 mg total) by mouth daily with supper. 30 tablet 0  . BAYER CONTOUR TEST test strip USE ONE STRIP TO CHECK GLUCOSE TWICE DAILY AND  AS  NEEDED 100 each 11   No current facility-administered medications for this visit.     Past Medical History:  Diagnosis Date  . Arthritis    "wrists, knees, back" (12/10/2015)  . Asthma   . Cerebral atherosclerosis   . Chronic back pain    "lower primarily" (12/10/2015)  . CORONARY ATHEROSCLEROSIS NATIVE CORONARY ARTERY    Myocardial infarction at [facility] in 1996 with a directional atherectomy of the 90% LAD stenosis and medical management of 60-70% proximal circumflex and 50% RCA stenosis.  . DYSLIPIDEMIA   . GERD (gastroesophageal reflux disease)   . History of hiatal hernia   .  HYPERTENSION   . ILIAC ARTERY ANEURYSM   . Myocardial infarction ([place]) 1996  . Stroke [facility]) 2003; 2013; 07/2015; 12/05/2015   denies residual on 12/10/2015, 12/13/16, 01/08/17  . TIA (transient ischemic attack)   . Type II diabetes mellitus ([place])   . UPPER RESPIRATORY INFECTION     Past Surgical History:  Procedure Laterality Date  . ANAL FISSURE REPAIR  1990s  . BASAL CELL CARCINOMA EXCISION Right 12/2015   "temple"  . CATARACT EXTRACTION W/ INTRAOCULAR LENS  IMPLANT, BILATERAL Bilateral 2000s  . CORONARY ANGIOPLASTY  1996  . KNEE CARTILAGE SURGERY Right 1970s  . LOOP RECORDER INSERTION N/A 12/15/2016   Procedure: Loop Recorder Insertion;  Surgeon: Thompson Grayer, MD;  Location: [place] CV LAB;  Service: Cardiovascular;  Laterality: N/A;  . SHOULDER ARTHROSCOPY W/ ROTATOR CUFF REPAIR Left 2000s  . TEE WITHOUT CARDIOVERSION N/A 12/15/2016   Procedure: TRANSESOPHAGEAL ECHOCARDIOGRAM (TEE);  Surgeon: Lelon Perla, MD;  Location: [facility] ENDOSCOPY;  Service: Cardiovascular;  Laterality: N/A;    ROS: Knee pain.Otherwise as stated in the HPI and negative for all other systems.  PHYSICAL EXAM BP 124/76   Pulse 68   Ht 5\' 11"  (1.803 m)   Wt 192 lb (87.1 kg)   BMI 26.78 kg/m   GENERAL:  Well appearing NECK:  No jugular venous distention, waveform within normal limits, carotid upstroke brisk and symmetric, no bruits, no thyromegaly LUNGS:  Clear to auscultation bilaterally CHEST:  Unremarkable HEART:  PMI not displaced or sustained,S1 and S2 within normal limits, no S3, no S4, no clicks, no rubs, 2 out of 6 apical systolic murmur radiating slightly at the aortic outflow tract, no diastolic murmurs ABD:  Flat, positive bowel sounds normal in frequency in pitch, no bruits, no rebound, no guarding, no midline pulsatile mass, no hepatomegaly, no splenomegaly EXT:  2 plus pulses throughout, no edema, no cyanosis no clubbing    Lab Results  Component Value Date   HGBA1C 5.7 (H) 04/22/2017    Lab Results  Component Value Date   CHOL 109 04/22/2017   TRIG 62 04/22/2017   HDL 41 04/22/2017   LDLCALC 56 04/22/2017     ASSESSMENT AND PLAN  TIA: He is now on Xarelto.  This was likely related to his radius and diagnosed atrial fibrillation.  He does not feel the fibrillation.  No change in therapy.   ATRIAL FIB:  [name] has a CHA2DS2 - VASc score of 6.   He tolerates anticoagulation.  He will continue the meds as listed.  PFO:  This is an incidental finding.  No change in therapy.  No further  imaging.  CAD:    The patient has no new sypmtoms.  No further cardiovascular testing is indicated.  We will continue with aggressive risk reduction and meds as listed.  HTN:   The blood pressure is at target most of the time.    He rarely needs to use the hydralazine but we will renew this for systolic blood pressures greater than 180.    HYPERLIPIDEMIA: He was instructed to get a lipid profile in September.  CAROTID STENOSIS:    This is mild.  No further imaging is planned.  DM:   His A1C was as above.    A1c most recently was 5.8.  He will continue the meds as listed.

## 2018-02-23 ENCOUNTER — Ambulatory Visit: Payer: Medicare Other | Admitting: Cardiology

## 2018-02-23 ENCOUNTER — Ambulatory Visit (INDEPENDENT_AMBULATORY_CARE_PROVIDER_SITE_OTHER): Payer: Medicare Other | Admitting: *Deleted

## 2018-02-23 ENCOUNTER — Encounter: Payer: Self-pay | Admitting: Cardiology

## 2018-02-23 VITALS — BP 124/76 | HR 68 | Ht 71.0 in | Wt 192.0 lb

## 2018-02-23 DIAGNOSIS — I10 Essential (primary) hypertension: Secondary | ICD-10-CM

## 2018-02-23 DIAGNOSIS — E785 Hyperlipidemia, unspecified: Secondary | ICD-10-CM | POA: Diagnosis not present

## 2018-02-23 DIAGNOSIS — I639 Cerebral infarction, unspecified: Secondary | ICD-10-CM

## 2018-02-23 DIAGNOSIS — I2510 Atherosclerotic heart disease of native coronary artery without angina pectoris: Secondary | ICD-10-CM | POA: Diagnosis not present

## 2018-02-23 DIAGNOSIS — E118 Type 2 diabetes mellitus with unspecified complications: Secondary | ICD-10-CM

## 2018-02-23 DIAGNOSIS — I480 Paroxysmal atrial fibrillation: Secondary | ICD-10-CM

## 2018-02-23 NOTE — Patient Instructions (Signed)
Medication Instructions:  The current medical regimen is effective;  continue present plan and medications.  Follow-Up: Follow up in 6 months with Dr. Hochrein.  You will receive a letter in the mail 2 months before you are due.  Please call us when you receive this letter to schedule your follow up appointment.  If you need a refill on your cardiac medications before your next appointment, please call your pharmacy.  Thank you for choosing [facility]!!       

## 2018-02-24 ENCOUNTER — Other Ambulatory Visit: Payer: Self-pay | Admitting: Cardiology

## 2018-02-24 LAB — CUP PACEART REMOTE DEVICE CHECK
Implantable Pulse Generator Implant Date: 20180515
MDC IDC SESS DTM: 20190622020827

## 2018-02-24 NOTE — Telephone Encounter (Signed)
Rx request sent to pharmacy.  

## 2018-02-24 NOTE — Progress Notes (Signed)
Carelink Summary Report / Loop Recorder 

## 2018-03-17 ENCOUNTER — Other Ambulatory Visit: Payer: Self-pay

## 2018-03-17 MED ORDER — PANTOPRAZOLE SODIUM 40 MG PO TBEC: 40.0000 mg | DELAYED_RELEASE_TABLET | Freq: Every day | ORAL | 0 refills | Status: DC

## 2018-03-28 ENCOUNTER — Ambulatory Visit (INDEPENDENT_AMBULATORY_CARE_PROVIDER_SITE_OTHER): Payer: Medicare Other | Admitting: *Deleted

## 2018-03-28 DIAGNOSIS — I639 Cerebral infarction, unspecified: Secondary | ICD-10-CM

## 2018-03-29 DIAGNOSIS — E1142 Type 2 diabetes mellitus with diabetic polyneuropathy: Secondary | ICD-10-CM | POA: Diagnosis not present

## 2018-03-29 DIAGNOSIS — L84 Corns and callosities: Secondary | ICD-10-CM | POA: Diagnosis not present

## 2018-03-29 DIAGNOSIS — M79676 Pain in unspecified toe(s): Secondary | ICD-10-CM | POA: Diagnosis not present

## 2018-03-29 DIAGNOSIS — B351 Tinea unguium: Secondary | ICD-10-CM | POA: Diagnosis not present

## 2018-03-29 NOTE — Progress Notes (Signed)
Carelink Summary Report / Loop Recorder 

## 2018-04-07 ENCOUNTER — Encounter: Payer: Self-pay | Admitting: Family Medicine

## 2018-04-07 ENCOUNTER — Ambulatory Visit (INDEPENDENT_AMBULATORY_CARE_PROVIDER_SITE_OTHER): Payer: Medicare Other | Admitting: Family Medicine

## 2018-04-07 VITALS — BP 146/82 | HR 59 | Temp 96.6°F | Ht 71.0 in | Wt 189.6 lb

## 2018-04-07 DIAGNOSIS — E1169 Type 2 diabetes mellitus with other specified complication: Secondary | ICD-10-CM

## 2018-04-07 DIAGNOSIS — K219 Gastro-esophageal reflux disease without esophagitis: Secondary | ICD-10-CM

## 2018-04-07 DIAGNOSIS — E785 Hyperlipidemia, unspecified: Secondary | ICD-10-CM | POA: Diagnosis not present

## 2018-04-07 DIAGNOSIS — E1159 Type 2 diabetes mellitus with other circulatory complications: Secondary | ICD-10-CM | POA: Diagnosis not present

## 2018-04-07 DIAGNOSIS — I10 Essential (primary) hypertension: Secondary | ICD-10-CM | POA: Diagnosis not present

## 2018-04-07 LAB — CMP14+EGFR
A/G RATIO: 2 (ref 1.2–2.2)
ALK PHOS: 84 IU/L (ref 39–117)
ALT: 25 IU/L (ref 0–44)
AST: 23 IU/L (ref 0–40)
Albumin: 4.5 g/dL (ref 3.5–4.7)
BUN/Creatinine Ratio: 10 (ref 10–24)
BUN: 11 mg/dL (ref 8–27)
Bilirubin Total: 1.1 mg/dL (ref 0.0–1.2)
CALCIUM: 9.2 mg/dL (ref 8.6–10.2)
CHLORIDE: 96 mmol/L (ref 96–106)
CO2: 24 mmol/L (ref 20–29)
Creatinine, Ser: 1.05 mg/dL (ref 0.76–1.27)
GFR calc Af Amer: 74 mL/min/{1.73_m2} (ref >59)
GFR calc non Af Amer: 64 mL/min/{1.73_m2} (ref >59)
GLOBULIN, TOTAL: 2.2 g/dL (ref 1.5–4.5)
Glucose: 126 mg/dL — ABNORMAL HIGH (ref 65–99)
POTASSIUM: 4.1 mmol/L (ref 3.5–5.2)
SODIUM: 136 mmol/L (ref 134–144)
Total Protein: 6.7 g/dL (ref 6.0–8.5)

## 2018-04-07 LAB — CBC WITH DIFFERENTIAL/PLATELET
BASOS ABS: 0 10*3/uL (ref 0.0–0.2)
Basos: 0 %
EOS (ABSOLUTE): 0.2 10*3/uL (ref 0.0–0.4)
Eos: 3 %
HEMATOCRIT: 40.4 % (ref 37.5–51.0)
Hemoglobin: 14 g/dL (ref 13.0–17.7)
IMMATURE GRANS (ABS): 0 10*3/uL (ref 0.0–0.1)
IMMATURE GRANULOCYTES: 0 %
LYMPHS: 28 %
Lymphocytes Absolute: 2 10*3/uL (ref 0.7–3.1)
MCH: 33.9 pg — ABNORMAL HIGH (ref 26.6–33.0)
MCHC: 34.7 g/dL (ref 31.5–35.7)
MCV: 98 fL — AB (ref 79–97)
MONOS ABS: 0.7 10*3/uL (ref 0.1–0.9)
Monocytes: 10 %
NEUTROS PCT: 59 %
Neutrophils Absolute: 4.4 10*3/uL (ref 1.4–7.0)
Platelets: 198 10*3/uL (ref 150–450)
RBC: 4.13 x10E6/uL — AB (ref 4.14–5.80)
RDW: 12.6 % (ref 12.3–15.4)
WBC: 7.4 10*3/uL (ref 3.4–10.8)

## 2018-04-07 LAB — LIPID PANEL
Chol/HDL Ratio: 2.4 ratio (ref 0.0–5.0)
Cholesterol, Total: 78 mg/dL — ABNORMAL LOW (ref 100–199)
HDL: 33 mg/dL — AB (ref >39)
LDL Calculated: 27 mg/dL (ref 0–99)
TRIGLYCERIDES: 89 mg/dL (ref 0–149)
VLDL CHOLESTEROL CAL: 18 mg/dL (ref 5–40)

## 2018-04-07 LAB — BAYER DCA HB A1C WAIVED: HB A1C: 5.8 % (ref <7.0)

## 2018-04-07 NOTE — Progress Notes (Signed)
BP (!) 146/82   Pulse (!) 59   Temp (!) 96.6 F (35.9 C) (Oral)   Ht _0  (1.803 m)   Wt 189 lb 9.6 oz (86 kg)   BMI 26.44 kg/m    Subjective:    Patient ID: [name], male    DOB: [birth year], 84 y.o.   MRN: [id number]  HPI: [name] is a 84 y.o. male presenting on 04/07/2018 for Diabetes (3 month follow up); Hypertension; and Establish Care Wendi Snipes pt)   HPI Hypertension Patient is currently on hydrochlorothiazide and metoprolol and benazepril and as needed hydralazine, and their blood pressure today is 146/82, patient says at home his blood pressure was in the 130s over 70s and he has a cardiologist that follows up on this as well. Patient denies any lightheadedness or dizziness. Patient denies headaches, blurred vision, chest pains, shortness of breath, or weakness. Denies any side effects from medication and is content with current medication.  Patient has a history of CAD and TIAs and strokes but has not had any issues since he was diagnosed with A. fib and has been on Xarelto.  We will continue to monitor his blood pressure  Type 2 diabetes mellitus Patient comes in today for recheck of his diabetes. Patient has been currently taking metformin, his last A1c was 5.7. Patient is currently on an ACE inhibitor/ARB. Patient has not seen an ophthalmologist this year. Patient denies any issues with their feet.   Hyperlipidemia Patient is coming in for recheck of his hyperlipidemia. The patient is currently taking Lipitor and fish oil. They deny any issues with myalgias or history of liver damage from it. They deny any focal numbness or weakness or chest pain.   GERD Patient is currently on pantoprazole.  She denies any major symptoms or abdominal pain or belching or burping. She denies any blood in her stool or lightheadedness or dizziness.   Relevant past medical, surgical, family and social history reviewed and updated as indicated. Interim medical history since our  last visit reviewed. Allergies and medications reviewed and updated.  Review of Systems  Constitutional: Negative for chills and fever.  Eyes: Negative for visual disturbance.  Respiratory: Negative for shortness of breath and wheezing.   Cardiovascular: Negative for chest pain and leg swelling.  Musculoskeletal: Negative for back pain and gait problem.  Skin: Negative for rash.  Neurological: Negative for dizziness, weakness and light-headedness.  All other systems reviewed and are negative.   Per HPI unless specifically indicated above   Allergies as of 04/07/2018      Reactions   Alteplase Anaphylaxis   Codeine Other (See Comments)   Bad dreams   Lopid [gemfibrozil] Nausea Only   Voltaren [diclofenac Sodium] Diarrhea      Medication List        Accurate as of 04/07/18  9:56 AM. Always use your most recent med list.          albuterol 108 (90 Base) MCG/ACT inhaler Commonly known as:  PROVENTIL HFA;VENTOLIN HFA Inhale into the lungs every 6 (six) hours as needed for wheezing or shortness of breath.   atorvastatin 80 MG tablet Commonly known as:  LIPITOR Take 1 tablet (80 mg total) by mouth daily at 6 PM.   BAYER CONTOUR TEST test strip Generic drug:  glucose blood USE ONE STRIP TO CHECK GLUCOSE TWICE DAILY AND  AS  NEEDED   benazepril 20 MG tablet Commonly known as:  LOTENSIN Take 1 tablet (20 mg  total) by mouth daily.   feeding supplement (GLUCERNA SHAKE) Liqd Take 237 mLs by mouth 2 (two) times daily between meals.   fish oil-omega-3 fatty acids 1000 MG capsule Take 2 g by mouth every evening.   Fluticasone-Salmeterol 100-50 MCG/DOSE Aepb Commonly known as:  ADVAIR INHALE ONE DOSE BY MOUTH EVERY 12 HOURS   hydrALAZINE 25 MG tablet Commonly known as:  APRESOLINE TAKE 1 TABLET BY MOUTH ONCE DAILY AS NEEDED   hydrochlorothiazide 25 MG tablet Commonly known as:  HYDRODIURIL TAKE ONE-HALF TABLET BY MOUTH ONCE DAILY EXCEPT  ON  MONDAY,  WEDNESDAY,  AND   FRIDAY  TAKE  A  WHOLE  TABLET   metFORMIN 500 MG 24 hr tablet Commonly known as:  GLUCOPHAGE-XR Take 1 tablet (500 mg total) by mouth 2 (two) times daily.   metoprolol tartrate 100 MG tablet Commonly known as:  LOPRESSOR Take 1 tablet (100 mg total) by mouth 2 (two) times daily.   nitroGLYCERIN 0.4 MG SL tablet Commonly known as:  NITROSTAT USE AS DIRECTED   pantoprazole 40 MG tablet Commonly known as:  PROTONIX Take 1 tablet (40 mg total) by mouth daily.   rivaroxaban 20 MG Tabs tablet Commonly known as:  XARELTO Take 1 tablet (20 mg total) by mouth daily with supper.   Vitamin D 2000 units Caps Take 2,000 Units by mouth daily.          Objective:    BP (!) 146/82   Pulse (!) 59   Temp (!) 96.6 F (35.9 C) (Oral)   Ht _0  (1.803 m)   Wt 189 lb 9.6 oz (86 kg)   BMI 26.44 kg/m   Wt Readings from Last 3 Encounters:  04/07/18 189 lb 9.6 oz (86 kg)  02/23/18 192 lb (87.1 kg)  01/25/18 193 lb 12.8 oz (87.9 kg)    Physical Exam  Constitutional: He is oriented to person, place, and time. He appears well-developed and well-nourished. No distress.  Eyes: Conjunctivae are normal. No scleral icterus.  Neck: Neck supple. No thyromegaly present.  Cardiovascular: Normal rate, regular rhythm, normal heart sounds and intact distal pulses.  No murmur heard. Pulmonary/Chest: Effort normal and breath sounds normal. No respiratory distress. He has no wheezes.  Musculoskeletal: Normal range of motion. He exhibits no edema.  Lymphadenopathy:    He has no cervical adenopathy.  Neurological: He is alert and oriented to person, place, and time. Coordination normal.  Skin: Skin is warm and dry. No rash noted. He is not diaphoretic.  Psychiatric: He has a normal mood and affect. His behavior is normal.  Nursing note and vitals reviewed.       Assessment & Plan:   Problem List Items Addressed This Visit      Cardiovascular and Mediastinum   Hypertension associated with  diabetes ([place])   Relevant Orders   CMP14+EGFR     Digestive   GERD (gastroesophageal reflux disease)   Relevant Orders   CBC with Differential/Platelet     Endocrine   Hyperlipidemia associated with type 2 diabetes mellitus ([place])   Relevant Orders   Lipid panel   DM type 2 (diabetes mellitus, type 2) ([place]) - Primary   Relevant Orders   Bayer DCA Hb A1c Waived   CMP14+EGFR      Continue metformin, continue metoprolol and hydrochlorothiazide and benazepril.  Will see back in 4 months Follow up plan: Return in about 4 months (around 08/07/2018), or if symptoms worsen or fail to improve, for Type  2 diabetes.  Counseling provided for all of the vaccine components Orders Placed This Encounter  Procedures  . Bayer DCA Hb A1c Waived  . CMP14+EGFR  . CBC with Differential/Platelet  . Lipid panel    Caryl Pina, MD [facility] 04/07/2018, 9:56 AM

## 2018-04-11 LAB — CUP PACEART REMOTE DEVICE CHECK
Date Time Interrogation Session: 20190725023848
Implantable Pulse Generator Implant Date: 20180515

## 2018-04-12 NOTE — Progress Notes (Signed)
[facility]  PATIENT: [name] DOB: 1930/05/08   REASON FOR VISIT:  follow up for left hemispheric TIA versus small MCA branch infarctSeptember 2018 not identified on imaging, IV TPA with good clinical recovery previous stroke right MCA infarcts in may 2018,  HISTORY FROM:patient and wife    HISTORY OF PRESENT ILLNESS:FROM RECORD Matthew C Wilsonis a 84 y.o.malewho reports that he experienced transient left tongue numbness. His family who were with him noticed that his left face drooped and his speech became thick. This has happened twice today. It is currently completely back to baseline. It happened also last Thursday. He has a history of multiple strokes, but has not had one since at least last a year. He is currently at baseline. Patient was not administered IV t-PA secondary to resolution of symptoms. He was admitted for further evaluation and treatment.He presented with transient numbness of the left border of his tongue and facial droop and slurred speech which is improving. He has history of multiple strokes in the past which were felt to be small vessel disease and he has been on aspirin, Aggrenox, aspirin and Plavix and more recently Plavix alone.12/14/16  MRI of the brain with several small right MCA infarcts, small vessel disease and atrophy. MRA no large vessel occlusion. 2-D echo 60-65% no source of embolus. LDL 49 hemoglobin A1c 6.3. Loop recorder placed. Interval history: 07/11/2018[name], 84 year old male returns for hospital follow-up. He remains on Plavix and aspirin for secondary stroke prevention. He has minimal bruising or bleeding He had an ER visit on 01/08/2017 for numbness on the right side of the face and the right hand. No changes were made in medication regimen  he was not admitted. His symptoms have resolved by the time  he was evaluated. He remains on Lipitor for hyperlipidemia without complaints of myalgias. Blood pressure in the office  today 151/81. Loop recorder has been placed that so far no evidence of arrhythmias. He returns for reevaluation  Patient was initially evaluated after a stroke in December 2016. He was readmitted on May 2017 with recurrent dysarthria and MRI showed a small left frontal lacunar infarct  PER RECORDMr. Ortega is a 61 year Caucasian male with a history of cerebral atherosclerosis, CAD, hyperlipidemia, hypertension, iliac artery aneurysm, MI, prior strokes (2003, 2013, 2016, 2017), TIA, and DM2 who presented to [facility] via EMS after sudden-onset RUE weakness and difficulty speaking and understanding language around 10:15 on 04/21/2017.  He is known to me from prior admission for stroke in office visit. History is obtained from the patient's wife is available at the bedside as well as from EMS and review of electronic medical records.  On scene, EMS noted initial improvement in his right-sided weakness as well as speech to some extent but by the time they reached [facility] the patient's speech had again gotten significantly worse but he did not have any recurrent right-sided weakness. Admit the patient upon arrival and CT scan of the head was obtained which showed no acute abnormalities. Patient blood pressure significantly elevated upon admission but this was controlled with IV nicardipine drip and he was given IV TPA with daughter negative time being 31 minutes. There was some delay secondary to starting an additional IV for emergentCT angiogram was ordered. CT angiogram personally reviewed shows no large vessel occlusion. Patient tolerated TPA infusion well but after the infusion was completed developed the significant swelling of the tongue as well as left side of the cheek.  This was felt to be angioedema and he was given IV Solu-Medrol and Benadryl. He did not appear to be in respiratory distress. The patient apparently had been doing well until prior to his onset of stroke symptoms. He had denied any  headache, vomiting and loss of vision. He had a prior history of embolic right MCA branch infarcts in May 2018 for which he was hospitalized at [facility] He had extensive evaluation including TEE as well as loop recorder insertion. He was seen in the office a few weeks ago and had been doing well he was on aspirin and Plavix. Patient had not had any recent surgeries. He had no known prior allergy to TPA. Last seen normal; ; 10 :15 On 04/21/17 IV TPA given ; Yes baseline MRS 1 He received IV tPA at 1246 on 04/21/2017.  He was admitted to the neuro ICU for further evaluation and treatment. His wife is at the bedside.  The patient is awake, alert, and follows all commands appropriately.  His speech seems to have improved back to normal and right-sided peripheral vision loss is also improving. Blood pressure has been adequately controlled. No new complaints. Tongue swelling has resolved Interval history 05/11/17 Matthew [name], 84 year old male returns for hospital follow-up after left hemispheric TIA versus small MCA branch infarct not  identified on imaging on 04/21/2017. MRA no large vessel occlusion. CTA head and neck no large vessel or high-grade stenosis. 2-D echo EF 60-65% no source of embolus. LDL 109 hemoglobin A1c 5.7 remains on aspirin and Plavix for secondary stroke prevention without further stroke or TIA symptoms. He has minor bruising and no bleeding.His atorvastatin was increased to 80 mg daily. He denies myalgias.  Blood pressure in the office today 140/80.he returns for reevaluation. Loop recorder without evidence of arrhythmias.  UPDATE 3/12/2018[name], 84 year old male returns for follow-up with history of left hemispheric TIA versus small MCA branch infarct in September 2018.  He remains on aspirin and Plavix for secondary stroke prevention he has minor bruising but no bleeding he has not had recurrent stroke or TIA symptoms however he was seen in the emergency room for hypertension  in January blood pressure 170/100.  His medications were changed he remains on Lipitor 80 mg daily without myalgias blood pressure in the office today 130/72.  Loop recorder so far without evidence of atrial fibrillation.  CBGs 110-130 most days.  He gets little exercise and was encouraged to do so.  He returns for reevaluation UPDATE 9/11/2019[name], 84 year old male returns for follow-up with history of left hemispheric TIA versus small MCA branch infarct December 2018.  Loop recorder found atrial fibrillation on 01/24/2018 when he was switched to Xarelto.  He has not had further stroke or TIA symptoms he has mild bruising but no bleeding and he has not had recurrent stroke or TIA symptoms.  He remains on Lipitor without myalgias.  Blood pressure in the office today 140/70.  He claims his blood sugars are 110-126 most days.  He gets little exercise.  He returns for reevaluation REVIEW OF SYSTEMS: Full 14 system review of systems performed and notable only for those listed, all others are neg:  Constitutional: neg  Cardiovascular: Recent episode of atrial fib on loop recorder Ear/Nose/Throat: neg  Skin: neg Eyes: neg Respiratory: neg Gastroitestinal: neg Hematology/Lymphatic: Easy bruising  Endocrine: neg Musculoskeletal Right knee pain llergy/Immunology: neg Neurological: neg Psychiatric: neg Sleep : neg   ALLERGIES: Allergies  Allergen Reactions  . Alteplase Anaphylaxis  .  Codeine Other (See Comments)    Bad dreams  . Lopid [Gemfibrozil] Nausea Only  . Voltaren [Diclofenac Sodium] Diarrhea    HOME MEDICATIONS: Outpatient Medications Prior to Visit  Medication Sig Dispense Refill  . albuterol (PROVENTIL HFA;VENTOLIN HFA) 108 (90 Base) MCG/ACT inhaler Inhale into the lungs every 6 (six) hours as needed for wheezing or shortness of breath.    Marland Kitchen atorvastatin (LIPITOR) 80 MG tablet Take 1 tablet (80 mg total) by mouth daily at 6 PM. 90 tablet 3  . BAYER CONTOUR TEST test strip  USE ONE STRIP TO CHECK GLUCOSE TWICE DAILY AND  AS  NEEDED 100 each 11  . benazepril (LOTENSIN) 20 MG tablet Take 1 tablet (20 mg total) by mouth daily. 90 tablet 3  . Cholecalciferol (VITAMIN D) 2000 units CAPS Take 2,000 Units by mouth daily.    . feeding supplement, GLUCERNA SHAKE, (GLUCERNA SHAKE) LIQD Take 237 mLs by mouth 2 (two) times daily between meals. 60 Can 11  . fish oil-omega-3 fatty acids 1000 MG capsule Take 2 g by mouth every evening.     . Fluticasone-Salmeterol (ADVAIR DISKUS) 100-50 MCG/DOSE AEPB INHALE ONE DOSE BY MOUTH EVERY 12 HOURS 180 each 2  . hydrALAZINE (APRESOLINE) 25 MG tablet TAKE 1 TABLET BY MOUTH ONCE DAILY AS NEEDED 90 tablet 0  . hydrochlorothiazide (HYDRODIURIL) 25 MG tablet TAKE ONE-HALF TABLET BY MOUTH ONCE DAILY EXCEPT  ON  MONDAY,  WEDNESDAY,  AND  FRIDAY  TAKE  A  WHOLE  TABLET 90 tablet 3  . metFORMIN (GLUCOPHAGE-XR) 500 MG 24 hr tablet Take 1 tablet (500 mg total) by mouth 2 (two) times daily. 180 tablet 2  . metoprolol tartrate (LOPRESSOR) 100 MG tablet Take 1 tablet (100 mg total) by mouth 2 (two) times daily. 180 tablet 2  . nitroGLYCERIN (NITROSTAT) 0.4 MG SL tablet USE AS DIRECTED 25 tablet 1  . pantoprazole (PROTONIX) 40 MG tablet Take 1 tablet (40 mg total) by mouth daily. 90 tablet 0  . rivaroxaban (XARELTO) 20 MG TABS tablet Take 1 tablet (20 mg total) by mouth daily with supper. 30 tablet 0   No facility-administered medications prior to visit.     PAST MEDICAL HISTORY: Past Medical History:  Diagnosis Date  . Arthritis    "wrists, knees, back" (12/10/2015)  . Asthma   . Cerebral atherosclerosis   . Chronic back pain    "lower primarily" (12/10/2015)  . CORONARY ATHEROSCLEROSIS NATIVE CORONARY ARTERY    Myocardial infarction at [facility] in 1996 with a directional atherectomy of the 90% LAD stenosis and medical management of 60-70% proximal circumflex and 50% RCA stenosis.  . DYSLIPIDEMIA   . GERD (gastroesophageal reflux disease)   .  History of hiatal hernia   . HYPERTENSION   . ILIAC ARTERY ANEURYSM   . Myocardial infarction ([place]) 1996  . Stroke [facility]) 2003; 2013; 07/2015; 12/05/2015   denies residual on 12/10/2015, 12/13/16, 01/08/17  . TIA (transient ischemic attack)   . Type II diabetes mellitus ([place])   . UPPER RESPIRATORY INFECTION     PAST SURGICAL HISTORY: Past Surgical History:  Procedure Laterality Date  . ANAL FISSURE REPAIR  1990s  . BASAL CELL CARCINOMA EXCISION Right 12/2015   "temple"  . CATARACT EXTRACTION W/ INTRAOCULAR LENS  IMPLANT, BILATERAL Bilateral 2000s  . CORONARY ANGIOPLASTY  1996  . KNEE CARTILAGE SURGERY Right 1970s  . LOOP RECORDER INSERTION N/A 12/15/2016   Procedure: Loop Recorder Insertion;  Surgeon: Thompson Grayer, MD;  Location:  [place] INVASIVE CV LAB;  Service: Cardiovascular;  Laterality: N/A;  . SHOULDER ARTHROSCOPY W/ ROTATOR CUFF REPAIR Left 2000s  . TEE WITHOUT CARDIOVERSION N/A 12/15/2016   Procedure: TRANSESOPHAGEAL ECHOCARDIOGRAM (TEE);  Surgeon: Lelon Perla, MD;  Location: [facility] ENDOSCOPY;  Service: Cardiovascular;  Laterality: N/A;    FAMILY HISTORY: Family History  Problem Relation Age of Onset  . Coronary artery disease Mother   . Cancer Father        lung  . Stroke Father   . Cancer Brother        lung  . Stroke Sister     SOCIAL HISTORY: Social History   Socioeconomic History  . Marital status: Married    Spouse name: Not on file  . Number of children: Not on file  . Years of education: Not on file  . Highest education level: Not on file  Occupational History  . 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 Financial resource strain: Not on file  . Food insecurity:    Worry: Not on file    Inability: Not on file  . Transportation needs:    Medical: Not on file    Non-medical: Not on file  Tobacco Use  . Smoking status: Former Smoker    Packs/day: 0.12    Years: 45.00    Pack years: 5.40    Types: Cigarettes    Last attempt to  quit: 11/24/1984    Years since quitting: 33.4  . Smokeless tobacco: Former Systems developer    Types: Snuff  . Tobacco comment: "dipped snuff when I was a boy"  Substance and Sexual Activity  . Alcohol use: No    Alcohol/week: 0.0 standard drinks  . Drug use: No  . Sexual activity: Never  Lifestyle  . Physical activity:    Days per week: Not on file    Minutes per session: Not on file  . Stress: Not on file  Relationships  . Social connections:    Talks on phone: Not on file    Gets together: Not on file    Attends religious service: Not on file    Active member of club or organization: Not on file    Attends meetings of clubs or organizations: Not on file    Relationship status: Not on file  . Intimate partner violence:    Fear of current or ex partner: Not on file    Emotionally abused: Not on file    Physically abused: Not on file    Forced sexual activity: Not on file  Other Topics Concern  . Not on file  Social History Narrative  . Not on file     PHYSICAL EXAM  Vitals:   05/11/17 1022  BP:  140/70  Pulse: 60  Weight: 190 lb 9.6 oz (86.5 kg)  Height: 5\' 11"  (1.803 m)   Body mass index is 26.64 kg/m.  Generalized: Well developed, in no acute distress  Head: normocephalic and atraumatic,. Oropharynx benign  Neck: Supple, no carotid bruits  Cardiac: Regular rate rhythm,  Musculoskeletal: No deformity   Neurological examination   Mentation: Alert oriented to time, place, history taking. Attention span and concentration appropriate. Recent and remote memory intact.  Follows all commands speech and language fluent.   Cranial nerve II-XII: Pupils were equal round reactive to light extraocular movements were full, left field cut,. No facial weakness, hearing was intact to finger rubbing bilaterally. Uvula tongue midline. head turning and shoulder  shrug were normal and symmetric.Tongue protrusion into cheek strength was normal. Motor: normal bulk and tone, full strength in  the BUE, BLE, fine finger movements normal, no pronator drift. No focal weakness Sensory: normal and symmetric to light touch in the upper and lower extremities,  Coordination: finger-nose-finger, heel-to-shin bilaterally, no dysmetria, no tremor Reflexes:  symmetric upper and lower, plantar responses were flexor bilaterally. Gait and Station: Rising up from seated position without assistance, normal stance,  moderate stride, good arm swing, smooth turning, able to perform tiptoe, and heel walking without difficulty. Patient has a limp on the right due to knee pain .  Ambulates with single-point cane  DIAGNOSTIC DATA (LABS, IMAGING, TESTING) - I reviewed patient records, labs, notes, testing and imaging myself where available.  Lab Results  Component Value Date   WBC 7.4 04/07/2018   HGB 14.0 04/07/2018   HCT 40.4 04/07/2018   MCV 98 (H) 04/07/2018   PLT 198 04/07/2018      Component Value Date/Time   NA 136 04/07/2018 0958   K 4.1 04/07/2018 0958   CL 96 04/07/2018 0958   CO2 24 04/07/2018 0958   GLUCOSE 126 (H) 04/07/2018 0958   GLUCOSE 155 (H) 04/21/2017 1133   BUN 11 04/07/2018 0958   CREATININE 1.05 04/07/2018 0958   CREATININE 1.27 02/02/2013 0853   CALCIUM 9.2 04/07/2018 0958   PROT 6.7 04/07/2018 0958   ALBUMIN 4.5 04/07/2018 0958   AST 23 04/07/2018 0958   ALT 25 04/07/2018 0958   ALKPHOS 84 04/07/2018 0958   BILITOT 1.1 04/07/2018 0958   GFRNONAA 64 04/07/2018 0958   GFRNONAA 53 (L) 02/02/2013 0853   GFRAA 74 04/07/2018 0958   GFRAA 61 02/02/2013 0853   Lab Results  Component Value Date   CHOL 78 (L) 04/07/2018   HDL 33 (L) 04/07/2018   LDLCALC 27 04/07/2018   TRIG 89 04/07/2018   CHOLHDL 2.4 04/07/2018   Lab Results  Component Value Date   HGBA1C 5.7 (H) 04/22/2017    ASSESSMENT AND PLAN  84 y.o. year old male  has a past medical history of Arthritis;  Cerebral atherosclerosis; CORONARY ATHEROSCLEROSIS NATIVE CORONARY ARTERY; DYSLIPIDEMIA;   HYPERTENSION; ILIAC ARTERY ANEURYSM; Myocardial infarction ([place]) (1996); Stroke [facility]) (2003; 2013; 07/2015; 12/05/2015); TIA (transient ischemic attack); Type II diabetes mellitus ([place]);  Here for  follow-up for right MCA infarcts in May 2018 and .left hemispheric TIA versus small MCA branch infarctSeptember 2018 not identified on imaging. Loop recorder shows at fib on 01/24/18 and pt switched to Xarelto.   PLAN: Stressed the importance of management of risk factors to prevent further stroke Continue Xarelto for secondary stroke preventionand atrial fib Maintain strict control of hypertension with blood pressure goal below 130/90, today's reading 140/70 continue antihypertensive medications Control of diabetes with hemoglobin A1c below 6.5 followed by primary care most recent hemoglobin A1c 5.8 on 04/07/2018 continue diabetic medications Cholesterol with LDL cholesterol less than 70, followed by primary care,     continue Lipitor  Exercise by walking, recommend 30 minutes daily  use cane for safe ambulation eat healthy diet with whole grains,  fresh fruits and vegetables Loop recorder recorded  atrial fibrillation on 01/24/2018 switched to Xarelto Will discharge from stroke clinic, neurologically patient is stable I spent 25 minutes in total face to face time with the patient more than 50% of which was spent counseling and coordination of care, reviewing test results reviewing medications and discussing and reviewing the diagnosis of stroke and management  of risk factors.  Discussed the importance of exercise.  Answered additional questions.  Patient given written information as well Dennie Bible, [facility], [facility], APRN  [facility] [address] [place] [facility] [place], [place] [address] [phone]

## 2018-04-13 ENCOUNTER — Ambulatory Visit: Payer: Medicare Other | Admitting: Nurse Practitioner

## 2018-04-13 ENCOUNTER — Encounter: Payer: Self-pay | Admitting: Nurse Practitioner

## 2018-04-13 VITALS — BP 140/70 | HR 60 | Ht 71.0 in | Wt 191.0 lb

## 2018-04-13 DIAGNOSIS — I480 Paroxysmal atrial fibrillation: Secondary | ICD-10-CM | POA: Diagnosis not present

## 2018-04-13 DIAGNOSIS — E1169 Type 2 diabetes mellitus with other specified complication: Secondary | ICD-10-CM | POA: Diagnosis not present

## 2018-04-13 DIAGNOSIS — Z8673 Personal history of transient ischemic attack (TIA), and cerebral infarction without residual deficits: Secondary | ICD-10-CM | POA: Diagnosis not present

## 2018-04-13 DIAGNOSIS — I10 Essential (primary) hypertension: Secondary | ICD-10-CM

## 2018-04-13 DIAGNOSIS — E1159 Type 2 diabetes mellitus with other circulatory complications: Secondary | ICD-10-CM

## 2018-04-13 DIAGNOSIS — E785 Hyperlipidemia, unspecified: Secondary | ICD-10-CM

## 2018-04-13 NOTE — Patient Instructions (Addendum)
Stressed the importance of management of risk factors to prevent further stroke Continue Xarelto for secondary stroke preventionand atrial fib Maintain strict control of hypertension with blood pressure goal below 130/90, today's reading 140/70 continue antihypertensive medications Control of diabetes with hemoglobin A1c below 6.5 followed by primary care  continue diabetic medications Cholesterol with LDL cholesterol less than 70, followed by primary care,     continue Lipitor  Exercise by walking, recommend 30 minutes daily  use cane for safe ambulation eat healthy diet with whole grains,  fresh fruits and vegetables Loop recorder recorded 8 hours of atrial fibrillation on 01/24/2018 switched to Clarence Will discharge from stroke clinic  Stroke Prevention Some health problems and behaviors may make it more likely for you to have a stroke. Below are ways to lessen your risk of having a stroke.  Be active for at least 30 minutes on most or all days.  Do not smoke. Try not to be around others who smoke.  Do not drink too much alcohol. ? Do not have more than 2 drinks a day if you are a man. ? Do not have more than 1 drink a day if you are a woman and are not pregnant.  Eat healthy foods, such as fruits and vegetables. If you were put on a specific diet, follow the diet as told.  Keep your cholesterol levels under control through diet and medicines. Look for foods that are low in saturated fat, trans fat, cholesterol, and are high in fiber.  If you have diabetes, follow all diet plans and take your medicine as told.  Ask your doctor if you need treatment to lower your blood pressure. If you have high blood pressure (hypertension), follow all diet plans and take your medicine as told by your doctor.  If you are 67-68 years old, have your blood pressure checked every 3-5 years. If you are age 60 or older, have your blood pressure checked every year.  Keep a healthy weight. Eat foods that  are low in calories, salt, saturated fat, trans fat, and cholesterol.  Do not take drugs.  Avoid birth control pills, if this applies. Talk to your doctor about the risks of taking birth control pills.  Talk to your doctor if you have sleep problems (sleep apnea).  Take all medicine as told by your doctor. ? You may be told to take aspirin or blood thinner medicine. Take this medicine as told by your doctor. ? Understand your medicine instructions.  Make sure any other conditions you have are being taken care of.  Get help right away if:  You suddenly lose feeling (you feel numb) or have weakness in your face, arm, or leg.  Your face or eyelid hangs down to one side.  You suddenly feel confused.  You have trouble talking (aphasia) or understanding what people are saying.  You suddenly have trouble seeing in one or both eyes.  You suddenly have trouble walking.  You are dizzy.  You lose your balance or your movements are clumsy (uncoordinated).  You suddenly have a very bad headache and you do not know the cause.  You have new chest pain.  Your heart feels like it is fluttering or skipping a beat (irregular heartbeat). Do not wait to see if the symptoms above go away. Get help right away. Call your local emergency services (911 in [place] Do not drive yourself to the hospital. This information is not intended to replace advice given to you by your  health care provider. Make sure you discuss any questions you have with your health care provider. Document Released: 01/19/2012 Document Revised: 12/26/2015 Document Reviewed: 01/20/2013 Elsevier Interactive Patient Education  [employer].

## 2018-04-14 ENCOUNTER — Ambulatory Visit: Payer: Medicare Other | Admitting: Nurse Practitioner

## 2018-04-17 NOTE — Progress Notes (Signed)
I agree with the above plan 

## 2018-04-26 LAB — CUP PACEART REMOTE DEVICE CHECK
Implantable Pulse Generator Implant Date: 20180515
MDC IDC SESS DTM: 20190827041041

## 2018-05-01 ENCOUNTER — Other Ambulatory Visit (HOSPITAL_COMMUNITY): Payer: Self-pay | Admitting: Nurse Practitioner

## 2018-05-01 DIAGNOSIS — I480 Paroxysmal atrial fibrillation: Secondary | ICD-10-CM

## 2018-05-02 ENCOUNTER — Ambulatory Visit (INDEPENDENT_AMBULATORY_CARE_PROVIDER_SITE_OTHER): Payer: Medicare Other | Admitting: *Deleted

## 2018-05-02 DIAGNOSIS — I639 Cerebral infarction, unspecified: Secondary | ICD-10-CM

## 2018-05-03 ENCOUNTER — Telehealth: Payer: Self-pay | Admitting: Cardiology

## 2018-05-03 NOTE — Progress Notes (Signed)
Carelink Summary Report / Loop Recorder 

## 2018-05-03 NOTE — Telephone Encounter (Signed)
LMOVM requesting that pt send manual transmission b/c home monitor has not updated in at least 14 days.    

## 2018-05-04 LAB — CUP PACEART REMOTE DEVICE CHECK
Implantable Pulse Generator Implant Date: 20180515
MDC IDC SESS DTM: 20190929084113

## 2018-06-02 ENCOUNTER — Other Ambulatory Visit: Payer: Self-pay | Admitting: Dermatology

## 2018-06-02 DIAGNOSIS — D0461 Carcinoma in situ of skin of right upper limb, including shoulder: Secondary | ICD-10-CM | POA: Diagnosis not present

## 2018-06-02 DIAGNOSIS — C44612 Basal cell carcinoma of skin of right upper limb, including shoulder: Secondary | ICD-10-CM | POA: Diagnosis not present

## 2018-06-02 DIAGNOSIS — L570 Actinic keratosis: Secondary | ICD-10-CM | POA: Diagnosis not present

## 2018-06-03 ENCOUNTER — Ambulatory Visit (INDEPENDENT_AMBULATORY_CARE_PROVIDER_SITE_OTHER): Payer: Medicare Other | Admitting: *Deleted

## 2018-06-03 DIAGNOSIS — I639 Cerebral infarction, unspecified: Secondary | ICD-10-CM | POA: Diagnosis not present

## 2018-06-05 NOTE — Progress Notes (Signed)
Carelink Summary Report / Loop Recorder 

## 2018-06-24 LAB — CUP PACEART REMOTE DEVICE CHECK
Date Time Interrogation Session: 20191101084118
MDC IDC PG IMPLANT DT: 20180515

## 2018-07-05 DIAGNOSIS — L84 Corns and callosities: Secondary | ICD-10-CM | POA: Diagnosis not present

## 2018-07-05 DIAGNOSIS — M79676 Pain in unspecified toe(s): Secondary | ICD-10-CM | POA: Diagnosis not present

## 2018-07-05 DIAGNOSIS — B351 Tinea unguium: Secondary | ICD-10-CM | POA: Diagnosis not present

## 2018-07-05 DIAGNOSIS — E1142 Type 2 diabetes mellitus with diabetic polyneuropathy: Secondary | ICD-10-CM | POA: Diagnosis not present

## 2018-07-06 ENCOUNTER — Ambulatory Visit (INDEPENDENT_AMBULATORY_CARE_PROVIDER_SITE_OTHER): Payer: Medicare Other

## 2018-07-06 DIAGNOSIS — I639 Cerebral infarction, unspecified: Secondary | ICD-10-CM

## 2018-07-06 NOTE — Progress Notes (Signed)
Carelink Summary Report / Loop Recorder 

## 2018-07-12 ENCOUNTER — Telehealth: Payer: Self-pay | Admitting: *Deleted

## 2018-07-12 MED ORDER — GLUCOSE BLOOD VI STRP: ORAL_STRIP | 3 refills | Status: DC

## 2018-07-12 NOTE — Telephone Encounter (Signed)
Pt aware refill sent to pharmacy 

## 2018-07-15 ENCOUNTER — Ambulatory Visit: Payer: Medicare Other

## 2018-07-20 ENCOUNTER — Ambulatory Visit (INDEPENDENT_AMBULATORY_CARE_PROVIDER_SITE_OTHER): Payer: Medicare Other | Admitting: *Deleted

## 2018-07-20 DIAGNOSIS — Z23 Encounter for immunization: Secondary | ICD-10-CM | POA: Diagnosis not present

## 2018-08-04 ENCOUNTER — Other Ambulatory Visit: Payer: Self-pay

## 2018-08-04 MED ORDER — PANTOPRAZOLE SODIUM 40 MG PO TBEC: 40.0000 mg | DELAYED_RELEASE_TABLET | Freq: Every day | ORAL | 0 refills | Status: DC

## 2018-08-05 ENCOUNTER — Ambulatory Visit: Payer: Medicare Other | Admitting: Family Medicine

## 2018-08-08 ENCOUNTER — Ambulatory Visit (INDEPENDENT_AMBULATORY_CARE_PROVIDER_SITE_OTHER): Payer: Medicare Other

## 2018-08-08 DIAGNOSIS — I639 Cerebral infarction, unspecified: Secondary | ICD-10-CM | POA: Diagnosis not present

## 2018-08-09 LAB — CUP PACEART REMOTE DEVICE CHECK
Date Time Interrogation Session: 20200106113609
MDC IDC PG IMPLANT DT: 20180515

## 2018-08-09 NOTE — Progress Notes (Signed)
Carelink Summary Report / Loop Recorder 

## 2018-08-20 LAB — CUP PACEART REMOTE DEVICE CHECK
Date Time Interrogation Session: 20191204111121
MDC IDC PG IMPLANT DT: 20180515

## 2018-08-23 ENCOUNTER — Other Ambulatory Visit: Payer: Self-pay | Admitting: *Deleted

## 2018-08-23 MED ORDER — HYDROCHLOROTHIAZIDE 25 MG PO TABS: 25.0000 mg | ORAL_TABLET | Freq: Every day | ORAL | 0 refills | Status: DC

## 2018-08-24 DIAGNOSIS — J343 Hypertrophy of nasal turbinates: Secondary | ICD-10-CM | POA: Diagnosis not present

## 2018-08-24 DIAGNOSIS — Z87891 Personal history of nicotine dependence: Secondary | ICD-10-CM | POA: Diagnosis not present

## 2018-08-24 DIAGNOSIS — J342 Deviated nasal septum: Secondary | ICD-10-CM | POA: Diagnosis not present

## 2018-08-24 DIAGNOSIS — J328 Other chronic sinusitis: Secondary | ICD-10-CM | POA: Diagnosis not present

## 2018-08-24 DIAGNOSIS — H6123 Impacted cerumen, bilateral: Secondary | ICD-10-CM | POA: Diagnosis not present

## 2018-08-24 DIAGNOSIS — H918X1 Other specified hearing loss, right ear: Secondary | ICD-10-CM | POA: Diagnosis not present

## 2018-08-30 NOTE — Progress Notes (Signed)
HPI The patient presents for followup of his known coronary disease.  He was hospitalized earlier last year with a TIA.  I reviewed these records for this visit.    He had a loop recorder placed.  He did have a TEE demonstrating a small PFO. Doppler was negative for DVT.  MRI demonstrated small acute infarcts in the MCA distribution. He had an implanted loop monitor.  He was found to have atrial fib and was started on Xarelto.    Since I last saw him he is done very well.  He uses a cane but gets around reasonably well.   The patient denies any new symptoms such as chest discomfort, neck or arm discomfort. There has been no new shortness of breath, PND or orthopnea. There have been no reported palpitations, presyncope or syncope.   He has had no further neurologic symptoms.  He tolerates his anticoagulation.   Allergies  Allergen Reactions  . Alteplase Anaphylaxis  . Codeine Other (See Comments)    Bad dreams  . Lopid [Gemfibrozil] Nausea Only  . Voltaren [Diclofenac Sodium] Diarrhea    Current Outpatient Medications  Medication Sig Dispense Refill  . albuterol (PROVENTIL HFA;VENTOLIN HFA) 108 (90 Base) MCG/ACT inhaler Inhale into the lungs every 6 (six) hours as needed for wheezing or shortness of breath.    Marland Kitchen atorvastatin (LIPITOR) 80 MG tablet Take 1 tablet (80 mg total) by mouth daily at 6 PM. 90 tablet 3  . benazepril (LOTENSIN) 20 MG tablet Take 1 tablet (20 mg total) by mouth daily. 90 tablet 3  . Cholecalciferol (VITAMIN D) 2000 units CAPS Take 2,000 Units by mouth daily.    . feeding supplement, GLUCERNA SHAKE, (GLUCERNA SHAKE) LIQD Take 237 mLs by mouth 2 (two) times daily between meals. 60 Can 11  . fish oil-omega-3 fatty acids 1000 MG capsule Take 2 g by mouth every evening.     . Fluticasone-Salmeterol (ADVAIR DISKUS) 100-50 MCG/DOSE AEPB INHALE ONE DOSE BY MOUTH EVERY 12 HOURS 180 each 2  . hydrALAZINE (APRESOLINE) 25 MG tablet TAKE 1 TABLET BY MOUTH ONCE DAILY AS NEEDED  90 tablet 0  . hydrochlorothiazide (HYDRODIURIL) 25 MG tablet Take 1 tablet (25 mg total) by mouth daily. 90 tablet 0  . metFORMIN (GLUCOPHAGE-XR) 500 MG 24 hr tablet Take 1 tablet (500 mg total) by mouth 2 (two) times daily. 180 tablet 2  . metoprolol tartrate (LOPRESSOR) 100 MG tablet Take 1 tablet (100 mg total) by mouth 2 (two) times daily. 180 tablet 2  . nitroGLYCERIN (NITROSTAT) 0.4 MG SL tablet USE AS DIRECTED 25 tablet 1  . pantoprazole (PROTONIX) 40 MG tablet Take 1 tablet (40 mg total) by mouth daily. 90 tablet 0  . XARELTO 20 MG TABS tablet TAKE 1 TABLET BY MOUTH EVERY DAY WITH SUPPER 30 tablet 6  . glucose blood (CONTOUR NEXT TEST) test strip Test blood sugars twice daily 200 each 3   No current facility-administered medications for this visit.     Past Medical History:  Diagnosis Date  . Arthritis    "wrists, knees, back" (12/10/2015)  . Asthma   . Cerebral atherosclerosis   . Chronic back pain    "lower primarily" (12/10/2015)  . CORONARY ATHEROSCLEROSIS NATIVE CORONARY ARTERY    Myocardial infarction at [facility] in 1996 with a directional atherectomy of the 90% LAD stenosis and medical management of 60-70% proximal circumflex and 50% RCA stenosis.  . DYSLIPIDEMIA   . GERD (gastroesophageal reflux disease)   .  History of hiatal hernia   . HYPERTENSION   . ILIAC ARTERY ANEURYSM   . Myocardial infarction ([place]) 1996  . Stroke [facility]) 2003; 2013; 07/2015; 12/05/2015   denies residual on 12/10/2015, 12/13/16, 01/08/17  . TIA (transient ischemic attack)   . Type II diabetes mellitus ([place])   . UPPER RESPIRATORY INFECTION     Past Surgical History:  Procedure Laterality Date  . ANAL FISSURE REPAIR  1990s  . BASAL CELL CARCINOMA EXCISION Right 12/2015   "temple"  . CATARACT EXTRACTION W/ INTRAOCULAR LENS  IMPLANT, BILATERAL Bilateral 2000s  . CORONARY ANGIOPLASTY  1996  . KNEE CARTILAGE SURGERY Right 1970s  . LOOP RECORDER INSERTION N/A 12/15/2016   Procedure: Loop Recorder  Insertion;  Surgeon: Thompson Grayer, MD;  Location: [facility];  Service: Cardiovascular;  Laterality: N/A;  . SHOULDER ARTHROSCOPY W/ ROTATOR CUFF REPAIR Left 2000s  . TEE WITHOUT CARDIOVERSION N/A 12/15/2016   Procedure: TRANSESOPHAGEAL ECHOCARDIOGRAM (TEE);  Surgeon: Lelon Perla, MD;  Location: [facility] ENDOSCOPY;  Service: Cardiovascular;  Laterality: N/A;    ROS:   As stated in the HPI and negative for all other systems.  PHYSICAL EXAM BP (!) 142/80   Pulse 60   Ht 5\' 11"  (1.803 m)   Wt 190 lb (86.2 kg)   BMI 26.50 kg/m   GENERAL:  Well appearing NECK:  No jugular venous distention, waveform within normal limits, carotid upstroke brisk and symmetric, no bruits, no thyromegaly LUNGS:  Clear to auscultation bilaterally CHEST:  Implanted loop in place HEART:  PMI not displaced or sustained,S1 and S2 within normal limits, no S3, no S4, no clicks, no rubs, 2/6 apical systolic murmur non radiating, no diastolic murmurs ABD:  Flat, positive bowel sounds normal in frequency in pitch, no bruits, no rebound, no guarding, no midline pulsatile mass, no hepatomegaly, no splenomegaly EXT:  2 plus pulses throughout, no edema, no cyanosis no clubbing    Lab Results  Component Value Date   HGBA1C 5.8 04/07/2018   Lab Results  Component Value Date   CHOL 78 (L) 04/07/2018   TRIG 89 04/07/2018   HDL 33 (L) 04/07/2018   LDLCALC 27 04/07/2018     ASSESSMENT AND PLAN  TIA: He is now on Xarelto.   No change in therapy.   ATRIAL FIB:  [name] has a CHA2DS2 - VASc score of 6.  Continue current therapy.  I reviewed the event downloads and there has been no fib since Spring.  No change in therapy.   PFO:  This is an incidental finding. No further testing.   CAD:    The patient has no new sypmtoms.   Continue with risk reduction.   HTN:   The blood pressure is at target.  Continue current meds.   HYPERLIPIDEMIA:   Lipids are excellent as above.  Continue current therapy.      CAROTID STENOSIS:    This is mild.  No further imaging.  Results reviewed.  planned.  DM:   His A1C was as above.  He will continue current therapy per Dr. Laurance Flatten.

## 2018-08-31 ENCOUNTER — Encounter: Payer: Self-pay | Admitting: Cardiology

## 2018-08-31 ENCOUNTER — Ambulatory Visit: Payer: Medicare Other | Admitting: Cardiology

## 2018-08-31 VITALS — BP 142/80 | HR 60 | Ht 71.0 in | Wt 190.0 lb

## 2018-08-31 DIAGNOSIS — E118 Type 2 diabetes mellitus with unspecified complications: Secondary | ICD-10-CM

## 2018-08-31 DIAGNOSIS — I480 Paroxysmal atrial fibrillation: Secondary | ICD-10-CM

## 2018-08-31 DIAGNOSIS — I10 Essential (primary) hypertension: Secondary | ICD-10-CM

## 2018-08-31 DIAGNOSIS — I2510 Atherosclerotic heart disease of native coronary artery without angina pectoris: Secondary | ICD-10-CM

## 2018-08-31 DIAGNOSIS — E785 Hyperlipidemia, unspecified: Secondary | ICD-10-CM | POA: Diagnosis not present

## 2018-08-31 NOTE — Patient Instructions (Signed)
Medication Instructions:  The current medical regimen is effective;  continue present plan and medications.  If you need a refill on your cardiac medications before your next appointment, please call your pharmacy.   Follow-Up: Follow up in 1 year with Dr. Hochrein in [place].  You will receive a letter in the mail 2 months before you are due.  Please call us when you receive this letter to schedule your follow up appointment.  Thank you for choosing [facility]!!     

## 2018-09-06 ENCOUNTER — Other Ambulatory Visit: Payer: Self-pay | Admitting: *Deleted

## 2018-09-06 MED ORDER — METFORMIN HCL ER 500 MG PO TB24: 500.0000 mg | ORAL_TABLET | Freq: Two times a day (BID) | ORAL | 0 refills | Status: DC

## 2018-09-06 MED ORDER — METOPROLOL TARTRATE 100 MG PO TABS: 100.0000 mg | ORAL_TABLET | Freq: Two times a day (BID) | ORAL | 0 refills | Status: DC

## 2018-09-06 MED ORDER — ATORVASTATIN CALCIUM 80 MG PO TABS: 80.0000 mg | ORAL_TABLET | Freq: Every day | ORAL | 0 refills | Status: DC

## 2018-09-06 NOTE — Addendum Note (Signed)
Addended by: Antonietta Barcelona D on: 09/06/2018 09:35 AM   Modules accepted: Orders

## 2018-09-08 ENCOUNTER — Encounter: Payer: Self-pay | Admitting: Family Medicine

## 2018-09-08 ENCOUNTER — Ambulatory Visit (INDEPENDENT_AMBULATORY_CARE_PROVIDER_SITE_OTHER): Payer: Medicare Other | Admitting: Family Medicine

## 2018-09-08 VITALS — BP 151/85 | HR 63 | Temp 93.4°F | Ht 71.0 in | Wt 192.0 lb

## 2018-09-08 DIAGNOSIS — I10 Essential (primary) hypertension: Secondary | ICD-10-CM | POA: Diagnosis not present

## 2018-09-08 DIAGNOSIS — I480 Paroxysmal atrial fibrillation: Secondary | ICD-10-CM

## 2018-09-08 DIAGNOSIS — E785 Hyperlipidemia, unspecified: Secondary | ICD-10-CM

## 2018-09-08 DIAGNOSIS — E1159 Type 2 diabetes mellitus with other circulatory complications: Secondary | ICD-10-CM

## 2018-09-08 DIAGNOSIS — K219 Gastro-esophageal reflux disease without esophagitis: Secondary | ICD-10-CM

## 2018-09-08 DIAGNOSIS — E1169 Type 2 diabetes mellitus with other specified complication: Secondary | ICD-10-CM

## 2018-09-08 LAB — BAYER DCA HB A1C WAIVED: HB A1C (BAYER DCA - WAIVED): 6.2 % (ref <7.0)

## 2018-09-08 MED ORDER — RIVAROXABAN 20 MG PO TABS: ORAL_TABLET | ORAL | 6 refills | Status: DC

## 2018-09-08 MED ORDER — PANTOPRAZOLE SODIUM 40 MG PO TBEC: 40.0000 mg | DELAYED_RELEASE_TABLET | Freq: Every day | ORAL | 3 refills | Status: DC

## 2018-09-08 NOTE — Progress Notes (Signed)
BP (!) 151/85   Pulse 63   Temp (!) 93.4 F (34.1 C) (Oral)   Ht 5\' 11"  (1.803 m)   Wt 192 lb (87.1 kg)   BMI 26.78 kg/m    Subjective:    Patient ID: [name], male    DOB: 04/15/1930, 85 y.o.   MRN: [id number]  HPI: [name] is a 85 y.o. male presenting on 09/08/2018 for Diabetes (4 month follow up); Hypertension; and Hyperlipidemia   HPI Hypertension Patient is currently on benazepril and hydralazine and hydrochlorothiazide and metoprolol, and their blood pressure today is 151/85. Patient denies any lightheadedness or dizziness. Patient denies headaches, blurred vision, chest pains, shortness of breath, or weakness. Denies any side effects from medication and is content with current medication.   Type 2 diabetes mellitus Patient comes in today for recheck of his diabetes. Patient has been currently taking metformin. Patient is currently on an ACE inhibitor/ARB. Patient has not seen an ophthalmologist this year. Patient denies any issues with their feet.   GERD Patient is currently on Protonix.  She denies any major symptoms or abdominal pain or belching or burping. She denies any blood in her stool or lightheadedness or dizziness.   Hyperlipidemia  Patient is coming in for recheck of his hyperlipidemia. The patient is currently taking fish oil and Lipitor. They deny any issues with myalgias or history of liver damage from it. They deny any focal numbness or weakness or chest pain.   Patient has paroxysmal A. fib and sees cardiology and just saw them a couple weeks ago,  Relevant past medical, surgical, family and social history reviewed and updated as indicated. Interim medical history since our last visit reviewed. Allergies and medications reviewed and updated.  Review of Systems  Constitutional: Negative for chills and fever.  Eyes: Negative for discharge and visual disturbance.  Respiratory: Negative for shortness of breath and wheezing.   Cardiovascular:  Negative for chest pain and leg swelling.  Musculoskeletal: Negative for back pain and gait problem.  Skin: Negative for rash.  Neurological: Negative for dizziness, weakness, light-headedness and numbness.  All other systems reviewed and are negative.   Per HPI unless specifically indicated above   Allergies as of 09/08/2018      Reactions   Alteplase Anaphylaxis   Codeine Other (See Comments)   Bad dreams   Lopid [gemfibrozil] Nausea Only   Voltaren [diclofenac Sodium] Diarrhea      Medication List       Accurate as of September 08, 2018 11:17 AM. Always use your most recent med list.        albuterol 108 (90 Base) MCG/ACT inhaler Commonly known as:  PROVENTIL HFA;VENTOLIN HFA Inhale into the lungs every 6 (six) hours as needed for wheezing or shortness of breath.   atorvastatin 80 MG tablet Commonly known as:  LIPITOR Take 1 tablet (80 mg total) by mouth daily at 6 PM.   benazepril 20 MG tablet Commonly known as:  LOTENSIN Take 1 tablet (20 mg total) by mouth daily.   feeding supplement (GLUCERNA SHAKE) Liqd Take 237 mLs by mouth 2 (two) times daily between meals.   fish oil-omega-3 fatty acids 1000 MG capsule Take 2 g by mouth every evening.   Fluticasone-Salmeterol 100-50 MCG/DOSE Aepb Commonly known as:  ADVAIR DISKUS INHALE ONE DOSE BY MOUTH EVERY 12 HOURS   glucose blood test strip Commonly known as:  CONTOUR NEXT TEST Test blood sugars twice daily   hydrALAZINE 25 MG  tablet Commonly known as:  APRESOLINE TAKE 1 TABLET BY MOUTH ONCE DAILY AS NEEDED   hydrochlorothiazide 25 MG tablet Commonly known as:  HYDRODIURIL Take 1 tablet (25 mg total) by mouth daily.   metFORMIN 500 MG 24 hr tablet Commonly known as:  GLUCOPHAGE-XR Take 1 tablet (500 mg total) by mouth 2 (two) times daily.   metoprolol tartrate 100 MG tablet Commonly known as:  LOPRESSOR Take 1 tablet (100 mg total) by mouth 2 (two) times daily.   nitroGLYCERIN 0.4 MG SL tablet Commonly  known as:  NITROSTAT USE AS DIRECTED   pantoprazole 40 MG tablet Commonly known as:  PROTONIX Take 1 tablet (40 mg total) by mouth daily.   rivaroxaban 20 MG Tabs tablet Commonly known as:  XARELTO TAKE 1 TABLET BY MOUTH EVERY DAY WITH SUPPER   Vitamin D 50 MCG (2000 UT) Caps Take 2,000 Units by mouth daily.          Objective:    BP (!) 151/85   Pulse 63   Temp (!) 93.4 F (34.1 C) (Oral)   Ht 5\' 11"  (1.803 m)   Wt 192 lb (87.1 kg)   BMI 26.78 kg/m   Wt Readings from Last 3 Encounters:  09/08/18 192 lb (87.1 kg)  08/31/18 190 lb (86.2 kg)  04/13/18 191 lb (86.6 kg)    Physical Exam Vitals signs and nursing note reviewed.  Constitutional:      General: He is not in acute distress.    Appearance: He is well-developed. He is not diaphoretic.  Eyes:     General: No scleral icterus.    Conjunctiva/sclera: Conjunctivae normal.  Neck:     Musculoskeletal: Neck supple.     Thyroid: No thyromegaly.  Cardiovascular:     Rate and Rhythm: Normal rate. Rhythm irregular.     Heart sounds: Murmur (Grade 2 out of 6 systolic murmur, best heard at right second intercostal space has cardiology already crescendo decrescendo) present.  Pulmonary:     Effort: Pulmonary effort is normal. No respiratory distress.     Breath sounds: Normal breath sounds. No wheezing.  Musculoskeletal: Normal range of motion.        General: Swelling (Trace edema in bilateral lower extremities) present.  Lymphadenopathy:     Cervical: No cervical adenopathy.  Skin:    General: Skin is warm and dry.     Findings: No rash.  Neurological:     Mental Status: He is alert and oriented to person, place, and time.     Coordination: Coordination normal.  Psychiatric:        Behavior: Behavior normal.         Assessment & Plan:   Problem List Items Addressed This Visit      Cardiovascular and Mediastinum   Hypertension associated with diabetes ([place]) - Primary   Relevant Medications    rivaroxaban (XARELTO) 20 MG TABS tablet   Paroxysmal atrial fibrillation (H[facility])   Relevant Medications   rivaroxaban (XARELTO) 20 MG TABS tablet     Digestive   GERD (gastroesophageal reflux disease)   Relevant Medications   pantoprazole (PROTONIX) 40 MG tablet     Endocrine   Hyperlipidemia associated with type 2 diabetes mellitus ([place])   DM type 2 (diabetes mellitus, type 2) ([place])   Relevant Orders   Bayer DCA Hb A1c Waived      Blood pressure is elevated today in the office but patient says is been running good at home and has been  running low at the cardiologist office we just saw a week ago so we will not touch that today but just keep an eye on it at home.  Follow up plan: Return in about 4 months (around 01/07/2019), or if symptoms worsen or fail to improve, for Diabetes and hypertension recheck.  Counseling provided for all of the vaccine components Orders Placed This Encounter  Procedures  . [facility] Hb A1c [place], MD [facility] 09/08/2018, 11:17 AM

## 2018-09-12 ENCOUNTER — Ambulatory Visit (INDEPENDENT_AMBULATORY_CARE_PROVIDER_SITE_OTHER): Payer: Medicare Other

## 2018-09-12 DIAGNOSIS — I639 Cerebral infarction, unspecified: Secondary | ICD-10-CM

## 2018-09-13 LAB — CUP PACEART REMOTE DEVICE CHECK
Date Time Interrogation Session: 20200208113542
Implantable Pulse Generator Implant Date: 20180515

## 2018-09-16 ENCOUNTER — Other Ambulatory Visit: Payer: Self-pay | Admitting: *Deleted

## 2018-09-16 MED ORDER — ALBUTEROL SULFATE HFA 108 (90 BASE) MCG/ACT IN AERS: 1.0000 | INHALATION_SPRAY | Freq: Four times a day (QID) | RESPIRATORY_TRACT | 11 refills | Status: DC | PRN

## 2018-09-26 NOTE — Progress Notes (Signed)
Carelink Summary Report / Loop Recorder 

## 2018-10-11 DIAGNOSIS — L84 Corns and callosities: Secondary | ICD-10-CM | POA: Diagnosis not present

## 2018-10-11 DIAGNOSIS — B351 Tinea unguium: Secondary | ICD-10-CM | POA: Diagnosis not present

## 2018-10-11 DIAGNOSIS — M79676 Pain in unspecified toe(s): Secondary | ICD-10-CM | POA: Diagnosis not present

## 2018-10-11 DIAGNOSIS — E1142 Type 2 diabetes mellitus with diabetic polyneuropathy: Secondary | ICD-10-CM | POA: Diagnosis not present

## 2018-10-12 ENCOUNTER — Other Ambulatory Visit: Payer: Self-pay | Admitting: *Deleted

## 2018-10-12 MED ORDER — FLUTICASONE-SALMETEROL 100-50 MCG/DOSE IN AEPB: INHALATION_SPRAY | RESPIRATORY_TRACT | 2 refills | Status: DC

## 2018-10-13 ENCOUNTER — Ambulatory Visit (INDEPENDENT_AMBULATORY_CARE_PROVIDER_SITE_OTHER): Payer: Medicare Other | Admitting: *Deleted

## 2018-10-13 DIAGNOSIS — I639 Cerebral infarction, unspecified: Secondary | ICD-10-CM | POA: Diagnosis not present

## 2018-10-14 LAB — CUP PACEART REMOTE DEVICE CHECK
Date Time Interrogation Session: 20200312144010
Implantable Pulse Generator Implant Date: 20180515

## 2018-10-20 NOTE — Progress Notes (Signed)
Carelink Summary Report / Loop Recorder 

## 2018-11-09 ENCOUNTER — Other Ambulatory Visit: Payer: Self-pay | Admitting: Family Medicine

## 2018-11-10 ENCOUNTER — Encounter: Payer: Medicare Other | Admitting: *Deleted

## 2018-11-10 ENCOUNTER — Other Ambulatory Visit: Payer: Self-pay | Admitting: *Deleted

## 2018-11-10 DIAGNOSIS — I10 Essential (primary) hypertension: Secondary | ICD-10-CM

## 2018-11-10 MED ORDER — BENAZEPRIL HCL 20 MG PO TABS: 20.0000 mg | ORAL_TABLET | Freq: Every day | ORAL | 0 refills | Status: DC

## 2018-11-15 ENCOUNTER — Ambulatory Visit (INDEPENDENT_AMBULATORY_CARE_PROVIDER_SITE_OTHER): Payer: Medicare Other | Admitting: *Deleted

## 2018-11-15 ENCOUNTER — Other Ambulatory Visit: Payer: Self-pay

## 2018-11-15 DIAGNOSIS — I639 Cerebral infarction, unspecified: Secondary | ICD-10-CM | POA: Diagnosis not present

## 2018-11-15 LAB — CUP PACEART REMOTE DEVICE CHECK
Date Time Interrogation Session: 20200414174011
Implantable Pulse Generator Implant Date: 20180515

## 2018-11-22 ENCOUNTER — Encounter: Payer: Self-pay | Admitting: Cardiology

## 2018-11-22 NOTE — Progress Notes (Signed)
Carelink Summary Report / Loop Recorder 

## 2018-12-02 ENCOUNTER — Other Ambulatory Visit: Payer: Self-pay

## 2018-12-02 ENCOUNTER — Ambulatory Visit (INDEPENDENT_AMBULATORY_CARE_PROVIDER_SITE_OTHER): Payer: Medicare Other | Admitting: *Deleted

## 2018-12-02 VITALS — Ht 71.0 in | Wt 192.0 lb

## 2018-12-02 DIAGNOSIS — Z0000 Encounter for general adult medical examination without abnormal findings: Secondary | ICD-10-CM | POA: Diagnosis not present

## 2018-12-02 NOTE — Patient Instructions (Signed)
[name] , Thank you for taking time to come for your Medicare Wellness Visit. I appreciate your ongoing commitment to your health goals. Please review the following plan we discussed and let me know if I can assist you in the future.   These are the goals we discussed: Goals    . DIET - EAT MORE VEGETABLES     Increase vegetable intake to 2-3 servings per day.       This is a list of the screening recommended for you and due dates:  Health Maintenance  Topic Date Due  . Eye exam for diabetics  03/06/2018  . Complete foot exam   08/12/2018  . Flu Shot  03/04/2019  . Hemoglobin A1C  03/09/2019  . Tetanus Vaccine  04/12/2021  . Pneumonia vaccines  Completed     Advance Directive  Advance directives are legal documents that let you make choices ahead of time about your health care and medical treatment in case you become unable to communicate for yourself. Advance directives are a way for you to communicate your wishes to family, friends, and health care providers. This can help convey your decisions about end-of-life care if you become unable to communicate. Discussing and writing advance directives should happen over time rather than all at once. Advance directives can be changed depending on your situation and what you want, even after you have signed the advance directives. If you do not have an advance directive, some states assign family decision makers to act on your behalf based on how closely you are related to them. Each state has its own laws regarding advance directives. You may want to check with your health care provider, attorney, or state representative about the laws in your state. There are different types of advance directives, such as:  Medical power of attorney.  Living will.  Do not resuscitate (DNR) or do not attempt resuscitation (DNAR) order. Health care proxy and medical power of attorney A health care proxy, also called a health care agent, is a person  who is appointed to make medical decisions for you in cases in which you are unable to make the decisions yourself. Generally, people choose someone they know well and trust to represent their preferences. Make sure to ask this person for an agreement to act as your proxy. A proxy may have to exercise judgment in the event of a medical decision for which your wishes are not known. A medical power of attorney is a legal document that names your health care proxy. Depending on the laws in your state, after the document is written, it may also need to be:  Signed.  Notarized.  Dated.  Copied.  Witnessed.  Incorporated into your medical record. You may also want to appoint someone to manage your financial affairs in a situation in which you are unable to do so. This is called a durable power of attorney for finances. It is a separate legal document from the durable power of attorney for health care. You may choose the same person or someone different from your health care proxy to act as your agent in financial matters. If you do not appoint a proxy, or if there is a concern that the proxy is not acting in your best interests, a court-appointed guardian may be designated to act on your behalf. Living will A living will is a set of instructions documenting your wishes about medical care when you cannot express them yourself. Health care providers should keep  a copy of your living will in your medical record. You may want to give a copy to family members or friends. To alert caregivers in case of an emergency, you can place a card in your wallet to let them know that you have a living will and where they can find it. A living will is used if you become:  Terminally ill.  Incapacitated.  Unable to communicate or make decisions. Items to consider in your living will include:  The use or non-use of life-sustaining equipment, such as dialysis machines and breathing machines (ventilators).  A DNR or  DNAR order, which is the instruction not to use cardiopulmonary resuscitation (CPR) if breathing or heartbeat stops.  The use or non-use of tube feeding.  Withholding of food and fluids.  Comfort (palliative) care when the goal becomes comfort rather than a cure.  Organ and tissue donation. A living will does not give instructions for distributing your money and property if you should pass away. It is recommended that you seek the advice of a lawyer when writing a will. Decisions about taxes, beneficiaries, and asset distribution will be legally binding. This process can relieve your family and friends of any concerns surrounding disputes or questions that may come up about the distribution of your assets. DNR or DNAR A DNR or DNAR order is a request not to have CPR in the event that your heart stops beating or you stop breathing. If a DNR or DNAR order has not been made and shared, a health care provider will try to help any patient whose heart has stopped or who has stopped breathing. If you plan to have surgery, talk with your health care provider about how your DNR or DNAR order will be followed if problems occur. Summary  Advance directives are the legal documents that allow you to make choices ahead of time about your health care and medical treatment in case you become unable to communicate for yourself.  The process of discussing and writing advance directives should happen over time. You can change the advance directives, even after you have signed them.  Advance directives include DNR or DNAR orders, living wills, and designating an agent as your medical power of attorney. This information is not intended to replace advice given to you by your health care provider. Make sure you discuss any questions you have with your health care provider. Document Released: 10/27/2007 Document Revised: 06/08/2016 Document Reviewed: 06/08/2016 Elsevier Interactive Patient Education  2019 [employer].

## 2018-12-02 NOTE — Progress Notes (Signed)
MEDICARE ANNUAL WELLNESS VISIT  12/02/2018  Telephone Visit Disclaimer This Medicare AWV was conducted by telephone due to national recommendations for restrictions regarding the COVID-19 Pandemic (e.g. social distancing).  I verified, using two identifiers, that I am speaking with [name] or their authorized healthcare agent. I discussed the limitations, risks, security, and privacy concerns of performing an evaluation and management service by telephone and the potential availability of an in-person appointment in the future. The patient expressed understanding and agreed to proceed.   Subjective:  [name] is a 85 y.o. male patient of Dettinger, Fransisca Kaufmann, MD who had a Medicare Annual Wellness Visit today via telephone. [name] is Retired and lives with their spouse. he has 3 children. he reports that he is socially active and does interact with friends/family regularly. he is minimally physically active and enjoys gardening.  Patient Care Team: Dettinger, Fransisca Kaufmann, MD as PCP - General (Family Medicine) Lavonna Monarch, MD as Consulting Physician (Dermatology) Clinger, Chrystie Nose, MD (Otolaryngology) Hassell Done Dicky Doe, NP as Nurse Practitioner (Neurology) Minus Breeding, MD as Consulting Physician (Cardiology)  Advanced Directives 12/02/2018 11/08/2017 04/21/2017 01/08/2017 12/13/2016 12/10/2015 12/05/2015  Does Patient Have a Medical Advance Directive? No No No No No No No  Would patient like information on creating a medical advance directive? Yes (MAU/Ambulatory/Procedural Areas - Information given) - No - Patient declined No - Patient declined - No - patient declined information -    Hospital Utilization Over the Past 12 Months: # of hospitalizations or ER visits: 0 # of surgeries: 0  Review of Systems    Patient reports that his overall health is unchanged compared to last year.  Patient Reported Readings (BP, Pulse, CBG, Weight, etc) CBG 198  Review of Systems: History  obtained from chart review and the patient General ROS: negative  All other systems negative.  Pain Assessment Pain : No/denies pain     Current Medications & Allergies (verified) Allergies as of 12/02/2018      Reactions   Alteplase Anaphylaxis   Codeine Other (See Comments)   Bad dreams   Lopid [gemfibrozil] Nausea Only   Voltaren [diclofenac Sodium] Diarrhea      Medication List       Accurate as of Dec 02, 2018  9:11 AM. Always use your most recent med list.        albuterol 108 (90 Base) MCG/ACT inhaler Commonly known as:  VENTOLIN HFA Inhale 1-2 puffs into the lungs every 6 (six) hours as needed for wheezing or shortness of breath.   atorvastatin 80 MG tablet Commonly known as:  LIPITOR TAKE 1 TABLET BY MOUTH ONCE DAILY AT  6PM   benazepril 20 MG tablet Commonly known as:  LOTENSIN Take 1 tablet (20 mg total) by mouth daily.   feeding supplement (GLUCERNA SHAKE) Liqd Take 237 mLs by mouth 2 (two) times daily between meals.   fish oil-omega-3 fatty acids 1000 MG capsule Take 2 g by mouth every evening.   Fluticasone-Salmeterol 100-50 MCG/DOSE Aepb Commonly known as:  Advair Diskus INHALE ONE DOSE BY MOUTH EVERY 12 HOURS   glucose blood test strip Commonly known as:  Contour Next Test Test blood sugars twice daily   hydrALAZINE 25 MG tablet Commonly known as:  APRESOLINE TAKE 1 TABLET BY MOUTH ONCE DAILY AS NEEDED   hydrochlorothiazide 25 MG tablet Commonly known as:  HYDRODIURIL Take 1 tablet by mouth once daily   metFORMIN 500 MG 24 hr tablet Commonly known  as:  GLUCOPHAGE-XR Take 1 tablet by mouth twice daily   metoprolol tartrate 100 MG tablet Commonly known as:  LOPRESSOR Take 1 tablet by mouth twice daily   nitroGLYCERIN 0.4 MG SL tablet Commonly known as:  NITROSTAT USE AS DIRECTED   pantoprazole 40 MG tablet Commonly known as:  PROTONIX Take 1 tablet (40 mg total) by mouth daily.   rivaroxaban 20 MG Tabs tablet Commonly known as:   Xarelto TAKE 1 TABLET BY MOUTH EVERY DAY WITH SUPPER   Vitamin D 50 MCG (2000 UT) Caps Take 2,000 Units by mouth daily.       History (reviewed): Past Medical History:  Diagnosis Date  . Arthritis    "wrists, knees, back" (12/10/2015)  . Asthma   . Cerebral atherosclerosis   . Chronic back pain    "lower primarily" (12/10/2015)  . CORONARY ATHEROSCLEROSIS NATIVE CORONARY ARTERY    Myocardial infarction at [facility] in 1996 with a directional atherectomy of the 90% LAD stenosis and medical management of 60-70% proximal circumflex and 50% RCA stenosis.  . DYSLIPIDEMIA   . GERD (gastroesophageal reflux disease)   . History of hiatal hernia   . HYPERTENSION   . ILIAC ARTERY ANEURYSM   . Myocardial infarction ([place]) 1996  . Stroke [facility]) 2003; 2013; 07/2015; 12/05/2015   denies residual on 12/10/2015, 12/13/16, 01/08/17  . TIA (transient ischemic attack)   . Type II diabetes mellitus ([place])   . UPPER RESPIRATORY INFECTION    Past Surgical History:  Procedure Laterality Date  . ANAL FISSURE REPAIR  1990s  . BASAL CELL CARCINOMA EXCISION Right 12/2015   "temple"  . CATARACT EXTRACTION W/ INTRAOCULAR LENS  IMPLANT, BILATERAL Bilateral 2000s  . CORONARY ANGIOPLASTY  1996  . KNEE CARTILAGE SURGERY Right 1970s  . LOOP RECORDER INSERTION N/A 12/15/2016   Procedure: Loop Recorder Insertion;  Surgeon: Thompson Grayer, MD;  Location: [place] [facility];  Service: Cardiovascular;  Laterality: N/A;  . SHOULDER ARTHROSCOPY W/ ROTATOR CUFF REPAIR Left 2000s  . TEE WITHOUT CARDIOVERSION N/A 12/15/2016   Procedure: TRANSESOPHAGEAL ECHOCARDIOGRAM (TEE);  Surgeon: Lelon Perla, MD;  Location: [facility] ENDOSCOPY;  Service: Cardiovascular;  Laterality: N/A;   Family History  Problem Relation Age of Onset  . Coronary artery disease Mother   . Cancer Father        lung  . Stroke Father   . Cancer Brother        lung  . Stroke Sister    Social History   Socioeconomic History  . Marital status: Married     Spouse name: Vanita Ingles  . Number of children: 3  . Years of education: Not on file  . Highest education level: 7th grade  Occupational History  . Occupation: Retired    Comment: [employer]  . Financial resource strain: Not hard at all  . Food insecurity:    Worry: Never true    Inability: Never true  . Transportation needs:    Medical: No    Non-medical: No  Tobacco Use  . Smoking status: Former Smoker    Packs/day: 0.12    Years: 45.00    Pack years: 5.40    Types: Cigarettes    Last attempt to quit: 11/24/1984    Years since quitting: 34.0  . Smokeless tobacco: Former Systems developer    Types: Snuff  . Tobacco comment: "dipped snuff when I was a boy"  Substance and Sexual Activity  . Alcohol use: No  Alcohol/week: 0.0 standard drinks  . Drug use: No  . Sexual activity: Not Currently  Lifestyle  . Physical activity:    Days per week: 7 days    Minutes per session: 30 min  . Stress: Not at all  Relationships  . Social connections:    Talks on phone: More than three times a week    Gets together: More than three times a week    Attends religious service: More than 4 times per year    Active member of club or organization: Yes    Attends meetings of clubs or organizations: More than 4 times per year    Relationship status: Married  Other Topics Concern  . Not on file  Social History Narrative  . Not on file    Activities of Daily Living In your present state of health, do you have any difficulty performing the following activities: 12/02/2018  Hearing? N  Vision? N  Difficulty concentrating or making decisions? N  Walking or climbing stairs? N  Dressing or bathing? N  Doing errands, shopping? N  Preparing Food and eating ? N  Using the Toilet? N  In the past six months, have you accidently leaked urine? N  Do you have problems with loss of bowel control? N  Managing your Medications? N  Managing your Finances? N  Housekeeping or managing your  Housekeeping? N  Some recent data might be hidden    Patient Literacy How often do you need to have someone help you when you read instructions, pamphlets, or other written materials from your doctor or pharmacy?: 1 - Never What is the last grade level you completed in school?: 7th grade  Exercise Current Exercise Habits: Home exercise routine, Type of exercise: walking, Time (Minutes): 30, Frequency (Times/Week): 7, Weekly Exercise (Minutes/Week): 210, Intensity: Mild, Exercise limited by: None identified  Diet Patient reports consuming 2 meals a day and 1 snack(s) a day Patient reports that his primary diet is: Regular Patient reports that she does have regular access to food.   Depression Screen PHQ 2/9 Scores 12/02/2018 09/08/2018 04/07/2018 12/10/2017 11/08/2017 11/08/2017 08/12/2017  PHQ - 2 Score 0 0 0 0 2 2 0  PHQ- 9 Score - - - - - 11 -     Fall Risk Fall Risk  12/02/2018 09/08/2018 04/13/2018 04/07/2018 12/10/2017  Falls in the past year? 0 0 No No No  Number falls in past yr: - - - - -  Injury with Fall? - - - - -     Objective:  [name] seemed alert and oriented and he participated appropriately during our telephone visit.  Blood Pressure Weight BMI  BP Readings from Last 3 Encounters:  09/08/18 (!) 151/85  08/31/18 (!) 142/80  04/13/18 140/70   Wt Readings from Last 3 Encounters:  12/02/18 192 lb (87.1 kg)  09/08/18 192 lb (87.1 kg)  08/31/18 190 lb (86.2 kg)   BMI Readings from Last 1 Encounters:  12/02/18 26.78 kg/m    *Unable to obtain current vital signs, weight, and BMI due to telephone visit type  Hearing/Vision  . [name] did not seem to have difficulty with hearing/understanding during the telephone conversation . Reports that he has had a formal eye exam by an eye care professional within the past year . Reports that he has not had a formal hearing evaluation within the past year *Unable to fully assess hearing and vision during telephone visit type   Cognitive Function: 6CIT Screen 12/02/2018  What Year? 0 points  What month? 3 points  What time? 0 points  Count back from 20 2 points  Months in reverse 0 points  Repeat phrase 0 points  Total Score 5    Normal Cognitive Function Screening: Yes (Normal:0-7, Significant for Dysfunction: >8)  Immunization & Health Maintenance Record Immunization History  Administered Date(s) Administered  . Influenza, High Dose Seasonal PF 06/10/2016, 07/20/2018  . Influenza,inj,Quad PF,6+ Mos 08/15/2013, 05/02/2014, 05/23/2015, 05/18/2017  . Pneumococcal Conjugate-13 08/15/2013  . Pneumococcal Polysaccharide-23 04/22/2015  . Tdap 04/13/2011  . Zoster 09/13/2013    Health Maintenance  Topic Date Due  . OPHTHALMOLOGY EXAM  03/06/2018  . FOOT EXAM  08/12/2018  . INFLUENZA VACCINE  03/04/2019  . HEMOGLOBIN A1C  03/09/2019  . TETANUS/TDAP  04/12/2021  . PNA vac Low Risk Adult  Completed       Assessment  This is a routine wellness examination for [name].  Health Maintenance: Due or Overdue Health Maintenance Due  Topic Date Due  . OPHTHALMOLOGY EXAM  03/06/2018  . FOOT EXAM  08/12/2018    [name] does not need a referral for Community Assistance: Care Management:   no Social Work:    no Prescription Assistance:  no Nutrition/Diabetes Education:  no   Plan:  Personalized Goals Goals Addressed   None    Personalized Health Maintenance & Screening Recommendations  Glaucoma screening  Lung Cancer Screening Recommended: no (Low Dose CT Chest recommended if Age 69-80 years, 30 pack-year currently smoking OR have quit w/in past 15 years) Hepatitis C Screening recommended: no HIV Screening recommended: no  Advanced Directives: Written information was prepared per patient's request.  Referrals & Orders No orders of the defined types were placed in this encounter.   Follow-up Plan . Follow-up with Dettinger, Fransisca Kaufmann, MD as planned . Schedule 01/09/2019 with  Dr. Warrick Parisian   I have personally reviewed and noted the following in the patient's chart:   . Medical and social history . Use of alcohol, tobacco or illicit drugs  . Current medications and supplements . Functional ability and status . Nutritional status . Physical activity . Advanced directives . List of other physicians . Hospitalizations, surgeries, and ER visits in previous 12 months . Vitals . Screenings to include cognitive, depression, and falls . Referrals and appointments  In addition, I have reviewed and discussed with [name] certain preventive protocols, quality metrics, and best practice recommendations. A written personalized care plan for preventive services as well as general preventive health recommendations is available and can be mailed to the patient at his request.      Truett Mainland, LPN  signature  [id number]   I have reviewed and agree with the above AWV documentation.   Evelina Dun, FNP

## 2018-12-14 ENCOUNTER — Encounter: Payer: Medicare Other | Admitting: *Deleted

## 2018-12-19 ENCOUNTER — Ambulatory Visit (INDEPENDENT_AMBULATORY_CARE_PROVIDER_SITE_OTHER): Payer: Medicare Other | Admitting: *Deleted

## 2018-12-19 ENCOUNTER — Other Ambulatory Visit: Payer: Self-pay

## 2018-12-19 DIAGNOSIS — I639 Cerebral infarction, unspecified: Secondary | ICD-10-CM | POA: Diagnosis not present

## 2018-12-19 LAB — CUP PACEART REMOTE DEVICE CHECK
Date Time Interrogation Session: 20200517181151
Implantable Pulse Generator Implant Date: 20180515

## 2018-12-20 DIAGNOSIS — B351 Tinea unguium: Secondary | ICD-10-CM | POA: Diagnosis not present

## 2018-12-20 DIAGNOSIS — E1142 Type 2 diabetes mellitus with diabetic polyneuropathy: Secondary | ICD-10-CM | POA: Diagnosis not present

## 2018-12-20 DIAGNOSIS — M79676 Pain in unspecified toe(s): Secondary | ICD-10-CM | POA: Diagnosis not present

## 2018-12-20 DIAGNOSIS — L84 Corns and callosities: Secondary | ICD-10-CM | POA: Diagnosis not present

## 2018-12-29 NOTE — Progress Notes (Signed)
Carelink Summary Report / Loop Recorder 

## 2019-01-09 ENCOUNTER — Ambulatory Visit: Payer: Medicare Other | Admitting: Family Medicine

## 2019-01-20 ENCOUNTER — Ambulatory Visit (INDEPENDENT_AMBULATORY_CARE_PROVIDER_SITE_OTHER): Payer: Medicare Other | Admitting: *Deleted

## 2019-01-20 DIAGNOSIS — I480 Paroxysmal atrial fibrillation: Secondary | ICD-10-CM | POA: Diagnosis not present

## 2019-01-22 LAB — CUP PACEART REMOTE DEVICE CHECK
Date Time Interrogation Session: 20200619181058
Implantable Pulse Generator Implant Date: 20180515

## 2019-01-26 NOTE — Progress Notes (Signed)
Carelink Summary Report / Loop Recorder 

## 2019-02-01 ENCOUNTER — Ambulatory Visit (INDEPENDENT_AMBULATORY_CARE_PROVIDER_SITE_OTHER): Payer: Medicare Other | Admitting: Family Medicine

## 2019-02-01 ENCOUNTER — Encounter: Payer: Self-pay | Admitting: Family Medicine

## 2019-02-01 ENCOUNTER — Other Ambulatory Visit: Payer: Self-pay

## 2019-02-01 DIAGNOSIS — I10 Essential (primary) hypertension: Secondary | ICD-10-CM

## 2019-02-01 DIAGNOSIS — I480 Paroxysmal atrial fibrillation: Secondary | ICD-10-CM

## 2019-02-01 DIAGNOSIS — E1159 Type 2 diabetes mellitus with other circulatory complications: Secondary | ICD-10-CM | POA: Diagnosis not present

## 2019-02-01 DIAGNOSIS — I152 Hypertension secondary to endocrine disorders: Secondary | ICD-10-CM

## 2019-02-01 DIAGNOSIS — E1169 Type 2 diabetes mellitus with other specified complication: Secondary | ICD-10-CM

## 2019-02-01 DIAGNOSIS — E785 Hyperlipidemia, unspecified: Secondary | ICD-10-CM

## 2019-02-01 MED ORDER — RIVAROXABAN 20 MG PO TABS: ORAL_TABLET | ORAL | 6 refills | Status: DC

## 2019-02-01 MED ORDER — BENAZEPRIL HCL 20 MG PO TABS: 20.0000 mg | ORAL_TABLET | Freq: Every day | ORAL | 3 refills | Status: DC

## 2019-02-01 MED ORDER — HYDROCHLOROTHIAZIDE 25 MG PO TABS: 25.0000 mg | ORAL_TABLET | Freq: Every day | ORAL | 3 refills | Status: DC

## 2019-02-01 MED ORDER — METOPROLOL TARTRATE 100 MG PO TABS: 100.0000 mg | ORAL_TABLET | Freq: Two times a day (BID) | ORAL | 3 refills | Status: DC

## 2019-02-01 MED ORDER — METFORMIN HCL ER 500 MG PO TB24: 500.0000 mg | ORAL_TABLET | Freq: Two times a day (BID) | ORAL | 3 refills | Status: DC

## 2019-02-01 MED ORDER — ATORVASTATIN CALCIUM 80 MG PO TABS: 80.0000 mg | ORAL_TABLET | Freq: Every day | ORAL | 3 refills | Status: DC

## 2019-02-01 NOTE — Progress Notes (Signed)
Virtual Visit via telephone Note  I connected with [name] on 02/01/19 at 0928 by telephone and verified that I am speaking with the correct person using two identifiers. [name] is currently located at home and wife are currently with her during visit. The provider, Fransisca Kaufmann , MD is located in their office at time of visit.  Call ended at [place]  I discussed the limitations, risks, security and privacy concerns of performing an evaluation and management service by telephone and the availability of in person appointments. I also discussed with the patient that there may be a patient responsible charge related to this service. The patient expressed understanding and agreed to proceed.   History and Present Illness: Hypertension Patient is currently on benazepril and hydralazine PRN and hydrochlorothiazide and metoprolol, and their blood pressure today is 143/71. Patient denies any lightheadedness or dizziness. Patient denies headaches, blurred vision, chest pains, shortness of breath, or weakness. Denies any side effects from medication and is content with current medication.   Type 2 diabetes mellitus Patient comes in today for recheck of his diabetes. Patient has been currently taking metformin and bs is 170. Patient is currently on an ACE inhibitor/ARB. Patient has not seen an ophthalmologist this year. Patient no any issues with their feet.   Hyperlipidemia Patient is coming in for recheck of his hyperlipidemia. The patient is currently taking lipitor. They deny any issues with myalgias or history of liver damage from it. They deny any focal numbness or weakness or chest pain. Patient has afib but no symptoms  No diagnosis found.  Outpatient Encounter Medications as of 02/01/2019  Medication Sig  . albuterol (PROVENTIL HFA;VENTOLIN HFA) 108 (90 Base) MCG/ACT inhaler Inhale 1-2 puffs into the lungs every 6 (six) hours as needed for wheezing or shortness of breath.  Marland Kitchen  atorvastatin (LIPITOR) 80 MG tablet TAKE 1 TABLET BY MOUTH ONCE DAILY AT  6PM  . benazepril (LOTENSIN) 20 MG tablet Take 1 tablet (20 mg total) by mouth daily.  . Cholecalciferol (VITAMIN D) 2000 units CAPS Take 2,000 Units by mouth daily.  . feeding supplement, GLUCERNA SHAKE, (GLUCERNA SHAKE) LIQD Take 237 mLs by mouth 2 (two) times daily between meals.  . fish oil-omega-3 fatty acids 1000 MG capsule Take 2 g by mouth every evening.   . Fluticasone-Salmeterol (ADVAIR DISKUS) 100-50 MCG/DOSE AEPB INHALE ONE DOSE BY MOUTH EVERY 12 HOURS  . glucose blood (CONTOUR NEXT TEST) test strip Test blood sugars twice daily  . hydrALAZINE (APRESOLINE) 25 MG tablet TAKE 1 TABLET BY MOUTH ONCE DAILY AS NEEDED  . hydrochlorothiazide (HYDRODIURIL) 25 MG tablet Take 1 tablet by mouth once daily  . metFORMIN (GLUCOPHAGE-XR) 500 MG 24 hr tablet Take 1 tablet by mouth twice daily  . metoprolol tartrate (LOPRESSOR) 100 MG tablet Take 1 tablet by mouth twice daily  . nitroGLYCERIN (NITROSTAT) 0.4 MG SL tablet USE AS DIRECTED (Patient not taking: Reported on 12/02/2018)  . pantoprazole (PROTONIX) 40 MG tablet Take 1 tablet (40 mg total) by mouth daily.  . rivaroxaban (XARELTO) 20 MG TABS tablet TAKE 1 TABLET BY MOUTH EVERY DAY WITH SUPPER   No facility-administered encounter medications on file as of 02/01/2019.     Review of Systems  Constitutional: Negative for chills and fever.  Eyes: Negative for visual disturbance.  Respiratory: Negative for shortness of breath and wheezing.   Cardiovascular: Negative for chest pain and leg swelling.  Musculoskeletal: Negative for back pain and gait problem.  Skin:  Negative for rash.  Neurological: Negative for dizziness, weakness, light-headedness and headaches.  All other systems reviewed and are negative.   Observations/Objective: Patient sounds comfortable and in no acute distress  Assessment and Plan: Problem List Items Addressed This Visit      Cardiovascular and  Mediastinum   Hypertension associated with diabetes ([place])   Relevant Medications   atorvastatin (LIPITOR) 80 MG tablet   benazepril (LOTENSIN) 20 MG tablet   hydrochlorothiazide (HYDRODIURIL) 25 MG tablet   metFORMIN (GLUCOPHAGE-XR) 500 MG 24 hr tablet   metoprolol tartrate (LOPRESSOR) 100 MG tablet   rivaroxaban (XARELTO) 20 MG TABS tablet   Paroxysmal atrial fibrillation (HCC)   Relevant Medications   atorvastatin (LIPITOR) 80 MG tablet   benazepril (LOTENSIN) 20 MG tablet   hydrochlorothiazide (HYDRODIURIL) 25 MG tablet   metoprolol tartrate (LOPRESSOR) 100 MG tablet   rivaroxaban (XARELTO) 20 MG TABS tablet     Endocrine   Hyperlipidemia associated with type 2 diabetes mellitus (HCC)   Relevant Medications   atorvastatin (LIPITOR) 80 MG tablet   benazepril (LOTENSIN) 20 MG tablet   metFORMIN (GLUCOPHAGE-XR) 500 MG 24 hr tablet   DM type 2 (diabetes mellitus, type 2) (HCC) - Primary   Relevant Medications   atorvastatin (LIPITOR) 80 MG tablet   benazepril (LOTENSIN) 20 MG tablet   metFORMIN (GLUCOPHAGE-XR) 500 MG 24 hr tablet       Follow Up Instructions: Follow up in 3 months  Patient did phone visit and it sounds like for the most part his sugars are running in the 120s to 130s occasionally up when he eats something.  Continue current medication and will do blood work when he returns in 3 months.   I discussed the assessment and treatment plan with the patient. The patient was provided an opportunity to ask questions and all were answered. The patient agreed with the plan and demonstrated an understanding of the instructions.   The patient was advised to call back or seek an in-person evaluation if the symptoms worsen or if the condition fails to improve as anticipated.  The above assessment and management plan was discussed with the patient. The patient verbalized understanding of and has agreed to the management plan. Patient is aware to call the clinic if symptoms  persist or worsen. Patient is aware when to return to the clinic for a follow-up visit. Patient educated on when it is appropriate to go to the emergency department.    I provided 12 minutes of non-face-to-face time during this encounter.    Worthy Rancher, MD

## 2019-02-21 DIAGNOSIS — E1142 Type 2 diabetes mellitus with diabetic polyneuropathy: Secondary | ICD-10-CM | POA: Diagnosis not present

## 2019-02-21 DIAGNOSIS — L84 Corns and callosities: Secondary | ICD-10-CM | POA: Diagnosis not present

## 2019-02-21 DIAGNOSIS — B351 Tinea unguium: Secondary | ICD-10-CM | POA: Diagnosis not present

## 2019-02-21 DIAGNOSIS — M79676 Pain in unspecified toe(s): Secondary | ICD-10-CM | POA: Diagnosis not present

## 2019-02-22 ENCOUNTER — Ambulatory Visit (INDEPENDENT_AMBULATORY_CARE_PROVIDER_SITE_OTHER): Payer: Medicare Other | Admitting: *Deleted

## 2019-02-22 DIAGNOSIS — I480 Paroxysmal atrial fibrillation: Secondary | ICD-10-CM

## 2019-02-22 DIAGNOSIS — Z87891 Personal history of nicotine dependence: Secondary | ICD-10-CM | POA: Diagnosis not present

## 2019-02-22 DIAGNOSIS — H6123 Impacted cerumen, bilateral: Secondary | ICD-10-CM | POA: Diagnosis not present

## 2019-02-22 DIAGNOSIS — B9689 Other specified bacterial agents as the cause of diseases classified elsewhere: Secondary | ICD-10-CM | POA: Diagnosis not present

## 2019-02-22 DIAGNOSIS — H918X1 Other specified hearing loss, right ear: Secondary | ICD-10-CM | POA: Diagnosis not present

## 2019-02-22 DIAGNOSIS — J343 Hypertrophy of nasal turbinates: Secondary | ICD-10-CM | POA: Diagnosis not present

## 2019-02-22 DIAGNOSIS — Z8673 Personal history of transient ischemic attack (TIA), and cerebral infarction without residual deficits: Secondary | ICD-10-CM

## 2019-02-22 DIAGNOSIS — J328 Other chronic sinusitis: Secondary | ICD-10-CM | POA: Diagnosis not present

## 2019-02-22 DIAGNOSIS — J342 Deviated nasal septum: Secondary | ICD-10-CM | POA: Diagnosis not present

## 2019-02-23 LAB — CUP PACEART REMOTE DEVICE CHECK
Date Time Interrogation Session: 20200722180828
Implantable Pulse Generator Implant Date: 20180515

## 2019-03-06 ENCOUNTER — Other Ambulatory Visit: Payer: Self-pay | Admitting: *Deleted

## 2019-03-06 MED ORDER — FLUTICASONE-SALMETEROL 100-50 MCG/DOSE IN AEPB: INHALATION_SPRAY | RESPIRATORY_TRACT | 2 refills | Status: DC

## 2019-03-09 ENCOUNTER — Other Ambulatory Visit: Payer: Self-pay | Admitting: Dermatology

## 2019-03-09 DIAGNOSIS — C44329 Squamous cell carcinoma of skin of other parts of face: Secondary | ICD-10-CM | POA: Diagnosis not present

## 2019-03-09 DIAGNOSIS — C44319 Basal cell carcinoma of skin of other parts of face: Secondary | ICD-10-CM | POA: Diagnosis not present

## 2019-03-09 NOTE — Progress Notes (Signed)
Carelink Summary Report / Loop Recorder 

## 2019-03-27 ENCOUNTER — Ambulatory Visit (INDEPENDENT_AMBULATORY_CARE_PROVIDER_SITE_OTHER): Payer: Medicare Other | Admitting: *Deleted

## 2019-03-27 DIAGNOSIS — I639 Cerebral infarction, unspecified: Secondary | ICD-10-CM

## 2019-03-27 DIAGNOSIS — I6389 Other cerebral infarction: Secondary | ICD-10-CM

## 2019-03-28 LAB — CUP PACEART REMOTE DEVICE CHECK
Date Time Interrogation Session: 20200824181151
Implantable Pulse Generator Implant Date: 20180515

## 2019-04-03 NOTE — Progress Notes (Signed)
Carelink Summary Report / Loop Recorder 

## 2019-05-01 ENCOUNTER — Ambulatory Visit (INDEPENDENT_AMBULATORY_CARE_PROVIDER_SITE_OTHER): Payer: Medicare Other | Admitting: *Deleted

## 2019-05-01 DIAGNOSIS — I480 Paroxysmal atrial fibrillation: Secondary | ICD-10-CM | POA: Diagnosis not present

## 2019-05-02 DIAGNOSIS — M79676 Pain in unspecified toe(s): Secondary | ICD-10-CM | POA: Diagnosis not present

## 2019-05-02 DIAGNOSIS — B351 Tinea unguium: Secondary | ICD-10-CM | POA: Diagnosis not present

## 2019-05-02 DIAGNOSIS — E1142 Type 2 diabetes mellitus with diabetic polyneuropathy: Secondary | ICD-10-CM | POA: Diagnosis not present

## 2019-05-02 DIAGNOSIS — L84 Corns and callosities: Secondary | ICD-10-CM | POA: Diagnosis not present

## 2019-05-02 LAB — CUP PACEART REMOTE DEVICE CHECK
Date Time Interrogation Session: 20200929034512
Implantable Pulse Generator Implant Date: 20180515

## 2019-05-09 ENCOUNTER — Other Ambulatory Visit: Payer: Self-pay

## 2019-05-10 ENCOUNTER — Encounter: Payer: Self-pay | Admitting: Family Medicine

## 2019-05-10 ENCOUNTER — Ambulatory Visit (INDEPENDENT_AMBULATORY_CARE_PROVIDER_SITE_OTHER): Payer: Medicare Other | Admitting: Family Medicine

## 2019-05-10 VITALS — BP 168/87 | HR 77 | Temp 97.3°F | Ht 71.0 in | Wt 188.6 lb

## 2019-05-10 DIAGNOSIS — E1169 Type 2 diabetes mellitus with other specified complication: Secondary | ICD-10-CM

## 2019-05-10 DIAGNOSIS — K219 Gastro-esophageal reflux disease without esophagitis: Secondary | ICD-10-CM | POA: Diagnosis not present

## 2019-05-10 DIAGNOSIS — E785 Hyperlipidemia, unspecified: Secondary | ICD-10-CM | POA: Diagnosis not present

## 2019-05-10 DIAGNOSIS — Z23 Encounter for immunization: Secondary | ICD-10-CM

## 2019-05-10 DIAGNOSIS — E559 Vitamin D deficiency, unspecified: Secondary | ICD-10-CM

## 2019-05-10 DIAGNOSIS — I480 Paroxysmal atrial fibrillation: Secondary | ICD-10-CM

## 2019-05-10 DIAGNOSIS — I10 Essential (primary) hypertension: Secondary | ICD-10-CM | POA: Diagnosis not present

## 2019-05-10 DIAGNOSIS — E1159 Type 2 diabetes mellitus with other circulatory complications: Secondary | ICD-10-CM | POA: Diagnosis not present

## 2019-05-10 LAB — BAYER DCA HB A1C WAIVED: HB A1C (BAYER DCA - WAIVED): 5.9 % (ref <7.0)

## 2019-05-10 MED ORDER — HYDRALAZINE HCL 25 MG PO TABS: 25.0000 mg | ORAL_TABLET | Freq: Every day | ORAL | 3 refills | Status: DC | PRN

## 2019-05-10 MED ORDER — BLOOD GLUCOSE MONITOR KIT: PACK | 0 refills | Status: DC

## 2019-05-10 NOTE — Progress Notes (Signed)
Carelink Summary Report / Loop Recorder 

## 2019-05-10 NOTE — Progress Notes (Signed)
BP (!) 168/87   Pulse 77   Temp (!) 97.3 F (36.3 C) (Temporal)   Ht '5\' 11"'$  (1.803 m)   Wt 188 lb 9.6 oz (85.5 kg)   BMI 26.30 kg/m    Subjective:   Patient ID: [name], male    DOB: 04/28/1930, 85 y.o.   MRN: [id number]  HPI: [name] is a 85 y.o. male presenting on 05/10/2019 for Diabetes (check up) and Hypertension   HPI Hypertension and A. fib Patient is currently on hydrochlorothiazide and hydralazine as needed and metoprolol and benazepril and Xarelto for A. fib, and their blood pressure today is 168/87, he is keeping a close eye on it but he still says that most the time when he checks it at home and checks it every morning is running in the 130s and only occasionally does run up higher.  When it runs of higher he will take a hydralazine.. Patient denies any lightheadedness or dizziness. Patient denies headaches, blurred vision, chest pains, shortness of breath, or weakness. Denies any side effects from medication and is content with current medication.   Type 2 diabetes mellitus Patient comes in today for recheck of his diabetes. Patient has been currently taking metformin 500 twice daily. Patient is currently on an ACE inhibitor/ARB. Patient has not seen an ophthalmologist this year. Patient denies any issues with their feet.   Hyperlipidemia Patient is coming in for recheck of his hyperlipidemia. The patient is currently taking atorvastatin. They deny any issues with myalgias or history of liver damage from it. They deny any focal numbness or weakness or chest pain.  GERD Patient is currently on pantoprazole.  She denies any major symptoms or abdominal pain or belching or burping. She denies any blood in her stool or lightheadedness or dizziness.   Patient has vitamin D deficiency and is currently taking vitamin D and will recheck levels today.  He denies any issues with fatigue  Relevant past medical, surgical, family and social history reviewed and updated as  indicated. Interim medical history since our last visit reviewed. Allergies and medications reviewed and updated.  Review of Systems  Constitutional: Negative for chills and fever.  Respiratory: Negative for shortness of breath and wheezing.   Cardiovascular: Negative for chest pain and leg swelling.  Musculoskeletal: Negative for back pain and gait problem.  Skin: Negative for rash.  Neurological: Negative for dizziness, weakness and numbness.  All other systems reviewed and are negative.   Per HPI unless specifically indicated above   Allergies as of 05/10/2019      Reactions   Alteplase Anaphylaxis   Codeine Other (See Comments)   Bad dreams   Lopid [gemfibrozil] Nausea Only   Voltaren [diclofenac Sodium] Diarrhea      Medication List       Accurate as of May 10, 2019  8:32 AM. If you have any questions, ask your nurse or doctor.        albuterol 108 (90 Base) MCG/ACT inhaler Commonly known as: VENTOLIN HFA Inhale 1-2 puffs into the lungs every 6 (six) hours as needed for wheezing or shortness of breath.   atorvastatin 80 MG tablet Commonly known as: LIPITOR Take 1 tablet (80 mg total) by mouth daily at 6 PM.   benazepril 20 MG tablet Commonly known as: LOTENSIN Take 1 tablet (20 mg total) by mouth daily.   blood glucose meter kit and supplies Kit Dispense based on patient and insurance preference. Use four times daily as  directed.  Diagnosis type 2 diabetes Started by: Fransisca Kaufmann Havanna Groner, MD   feeding supplement (GLUCERNA SHAKE) Liqd Take 237 mLs by mouth 2 (two) times daily between meals.   fish oil-omega-3 fatty acids 1000 MG capsule Take 2 g by mouth every evening.   Fluticasone-Salmeterol 100-50 MCG/DOSE Aepb Commonly known as: Advair Diskus INHALE ONE DOSE BY MOUTH EVERY 12 HOURS   glucose blood test strip Commonly known as: Contour Next Test Test blood sugars twice daily   hydrALAZINE 25 MG tablet Commonly known as: APRESOLINE Take 1 tablet  (25 mg total) by mouth daily as needed.   hydrochlorothiazide 25 MG tablet Commonly known as: HYDRODIURIL Take 1 tablet (25 mg total) by mouth daily.   metFORMIN 500 MG 24 hr tablet Commonly known as: GLUCOPHAGE-XR Take 1 tablet (500 mg total) by mouth 2 (two) times daily.   metoprolol tartrate 100 MG tablet Commonly known as: LOPRESSOR Take 1 tablet (100 mg total) by mouth 2 (two) times daily.   nitroGLYCERIN 0.4 MG SL tablet Commonly known as: NITROSTAT USE AS DIRECTED   pantoprazole 40 MG tablet Commonly known as: PROTONIX Take 1 tablet (40 mg total) by mouth daily.   rivaroxaban 20 MG Tabs tablet Commonly known as: Xarelto TAKE 1 TABLET BY MOUTH EVERY DAY WITH SUPPER   Vitamin D 50 MCG (2000 UT) Caps Take 2,000 Units by mouth daily.        Objective:   BP (!) 168/87   Pulse 77   Temp (!) 97.3 F (36.3 C) (Temporal)   Ht '5\' 11"'$  (1.803 m)   Wt 188 lb 9.6 oz (85.5 kg)   BMI 26.30 kg/m   Wt Readings from Last 3 Encounters:  05/10/19 188 lb 9.6 oz (85.5 kg)  12/02/18 192 lb (87.1 kg)  09/08/18 192 lb (87.1 kg)    Physical Exam Vitals signs and nursing note reviewed.  Constitutional:      General: He is not in acute distress.    Appearance: He is well-developed. He is not diaphoretic.  Eyes:     General: No scleral icterus.    Conjunctiva/sclera: Conjunctivae normal.  Neck:     Musculoskeletal: Neck supple.     Thyroid: No thyromegaly.  Cardiovascular:     Rate and Rhythm: Normal rate and regular rhythm.     Heart sounds: Murmur (Holosystolic at second intercostal space in the left) present. Systolic murmur present with a grade of 2/6.  Pulmonary:     Effort: Pulmonary effort is normal. No respiratory distress.     Breath sounds: Normal breath sounds. No wheezing.  Musculoskeletal: Normal range of motion.  Lymphadenopathy:     Cervical: No cervical adenopathy.  Skin:    General: Skin is warm and dry.     Findings: No rash.  Neurological:      Mental Status: He is alert and oriented to person, place, and time.     Coordination: Coordination normal.  Psychiatric:        Behavior: Behavior normal.       Assessment & Plan:   Problem List Items Addressed This Visit      Cardiovascular and Mediastinum   Hypertension associated with diabetes ([place])   Relevant Medications   hydrALAZINE (APRESOLINE) 25 MG tablet   Other Relevant Orders   CMP14+EGFR   Paroxysmal atrial fibrillation (HCC)   Relevant Medications   hydrALAZINE (APRESOLINE) 25 MG tablet     Digestive   GERD (gastroesophageal reflux disease)   Relevant Orders  CBC with Differential/Platelet     Endocrine   Hyperlipidemia associated with type 2 diabetes mellitus ([place]) - Primary   Relevant Orders   Lipid panel   DM type 2 (diabetes mellitus, type 2) (HCC)   Relevant Orders   Bayer DCA Hb A1c Waived     Other   Vitamin D deficiency   Relevant Orders   VITAMIN D 25 Hydroxy (Vit-D Deficiency, Fractures)      Continue current medication, will keep a close eye on blood pressures, continue hydralazine as needed if he starts running at most of the time that we will have to adjust. Follow up plan: Return in about 3 months (around 08/10/2019), or if symptoms worsen or fail to improve, for Diabetes and hypertension and cholesterol.  Counseling provided for all of the vaccine components Orders Placed This Encounter  Procedures  . CBC with Differential/Platelet  . CMP14+EGFR  . Lipid panel  . Bayer DCA Hb A1c Waived  . VITAMIN D 25 Hydroxy (Vit-D Deficiency, Fractures)    Caryl Pina, MD [facility] 05/10/2019, 8:32 AM

## 2019-05-11 LAB — LIPID PANEL
Chol/HDL Ratio: 2.4 ratio (ref 0.0–5.0)
Cholesterol, Total: 82 mg/dL — ABNORMAL LOW (ref 100–199)
HDL: 34 mg/dL — ABNORMAL LOW (ref >39)
LDL Chol Calc (NIH): 28 mg/dL (ref 0–99)
Triglycerides: 105 mg/dL (ref 0–149)
VLDL Cholesterol Cal: 20 mg/dL (ref 5–40)

## 2019-05-11 LAB — CBC WITH DIFFERENTIAL/PLATELET
Basophils Absolute: 0 10*3/uL (ref 0.0–0.2)
Basos: 0 %
EOS (ABSOLUTE): 0.2 10*3/uL (ref 0.0–0.4)
Eos: 3 %
Hematocrit: 43.2 % (ref 37.5–51.0)
Hemoglobin: 14.7 g/dL (ref 13.0–17.7)
Immature Grans (Abs): 0 10*3/uL (ref 0.0–0.1)
Immature Granulocytes: 0 %
Lymphocytes Absolute: 1.4 10*3/uL (ref 0.7–3.1)
Lymphs: 22 %
MCH: 33.7 pg — ABNORMAL HIGH (ref 26.6–33.0)
MCHC: 34 g/dL (ref 31.5–35.7)
MCV: 99 fL — ABNORMAL HIGH (ref 79–97)
Monocytes Absolute: 0.7 10*3/uL (ref 0.1–0.9)
Monocytes: 10 %
Neutrophils Absolute: 4.3 10*3/uL (ref 1.4–7.0)
Neutrophils: 65 %
Platelets: 181 10*3/uL (ref 150–450)
RBC: 4.36 x10E6/uL (ref 4.14–5.80)
RDW: 11.9 % (ref 11.6–15.4)
WBC: 6.6 10*3/uL (ref 3.4–10.8)

## 2019-05-11 LAB — CMP14+EGFR
ALT: 16 IU/L (ref 0–44)
AST: 19 IU/L (ref 0–40)
Albumin/Globulin Ratio: 1.8 (ref 1.2–2.2)
Albumin: 4.6 g/dL (ref 3.6–4.6)
Alkaline Phosphatase: 93 IU/L (ref 39–117)
BUN/Creatinine Ratio: 11 (ref 10–24)
BUN: 14 mg/dL (ref 8–27)
Bilirubin Total: 1.2 mg/dL (ref 0.0–1.2)
CO2: 24 mmol/L (ref 20–29)
Calcium: 9.5 mg/dL (ref 8.6–10.2)
Chloride: 92 mmol/L — ABNORMAL LOW (ref 96–106)
Creatinine, Ser: 1.28 mg/dL — ABNORMAL HIGH (ref 0.76–1.27)
GFR calc Af Amer: 58 mL/min/{1.73_m2} — ABNORMAL LOW (ref >59)
GFR calc non Af Amer: 50 mL/min/{1.73_m2} — ABNORMAL LOW (ref >59)
Globulin, Total: 2.5 g/dL (ref 1.5–4.5)
Glucose: 138 mg/dL — ABNORMAL HIGH (ref 65–99)
Potassium: 3.8 mmol/L (ref 3.5–5.2)
Sodium: 133 mmol/L — ABNORMAL LOW (ref 134–144)
Total Protein: 7.1 g/dL (ref 6.0–8.5)

## 2019-05-11 LAB — VITAMIN D 25 HYDROXY (VIT D DEFICIENCY, FRACTURES): Vit D, 25-Hydroxy: 69.9 ng/mL (ref 30.0–100.0)

## 2019-06-04 LAB — CUP PACEART REMOTE DEVICE CHECK
Date Time Interrogation Session: 20201101110632
Implantable Pulse Generator Implant Date: 20180515

## 2019-06-05 ENCOUNTER — Ambulatory Visit (INDEPENDENT_AMBULATORY_CARE_PROVIDER_SITE_OTHER): Payer: Medicare Other | Admitting: *Deleted

## 2019-06-05 DIAGNOSIS — I480 Paroxysmal atrial fibrillation: Secondary | ICD-10-CM

## 2019-06-19 LAB — HM DIABETES EYE EXAM

## 2019-06-27 ENCOUNTER — Other Ambulatory Visit: Payer: Self-pay

## 2019-06-27 ENCOUNTER — Encounter (HOSPITAL_COMMUNITY): Payer: Self-pay | Admitting: Emergency Medicine

## 2019-06-27 ENCOUNTER — Emergency Department (HOSPITAL_COMMUNITY)
Admission: EM | Admit: 2019-06-27 | Discharge: 2019-06-28 | Disposition: A | Discharge status: Home / Self Care | Payer: Medicare Other | Attending: Emergency Medicine | Admitting: Emergency Medicine

## 2019-06-27 ENCOUNTER — Emergency Department (HOSPITAL_COMMUNITY): Payer: Medicare Other

## 2019-06-27 DIAGNOSIS — S0083XA Contusion of other part of head, initial encounter: Secondary | ICD-10-CM | POA: Diagnosis not present

## 2019-06-27 DIAGNOSIS — Y939 Activity, unspecified: Secondary | ICD-10-CM | POA: Diagnosis not present

## 2019-06-27 DIAGNOSIS — S0990XA Unspecified injury of head, initial encounter: Secondary | ICD-10-CM | POA: Diagnosis not present

## 2019-06-27 DIAGNOSIS — I2510 Atherosclerotic heart disease of native coronary artery without angina pectoris: Secondary | ICD-10-CM | POA: Diagnosis not present

## 2019-06-27 DIAGNOSIS — Z7984 Long term (current) use of oral hypoglycemic drugs: Secondary | ICD-10-CM | POA: Insufficient documentation

## 2019-06-27 DIAGNOSIS — J45909 Unspecified asthma, uncomplicated: Secondary | ICD-10-CM | POA: Diagnosis not present

## 2019-06-27 DIAGNOSIS — H05231 Hemorrhage of right orbit: Secondary | ICD-10-CM

## 2019-06-27 DIAGNOSIS — I10 Essential (primary) hypertension: Secondary | ICD-10-CM | POA: Insufficient documentation

## 2019-06-27 DIAGNOSIS — Z7901 Long term (current) use of anticoagulants: Secondary | ICD-10-CM

## 2019-06-27 DIAGNOSIS — Y92009 Unspecified place in unspecified non-institutional (private) residence as the place of occurrence of the external cause: Secondary | ICD-10-CM | POA: Insufficient documentation

## 2019-06-27 DIAGNOSIS — Z87891 Personal history of nicotine dependence: Secondary | ICD-10-CM | POA: Diagnosis not present

## 2019-06-27 DIAGNOSIS — S0511XA Contusion of eyeball and orbital tissues, right eye, initial encounter: Secondary | ICD-10-CM | POA: Diagnosis not present

## 2019-06-27 DIAGNOSIS — Z79899 Other long term (current) drug therapy: Secondary | ICD-10-CM | POA: Insufficient documentation

## 2019-06-27 DIAGNOSIS — W010XXA Fall on same level from slipping, tripping and stumbling without subsequent striking against object, initial encounter: Secondary | ICD-10-CM | POA: Insufficient documentation

## 2019-06-27 DIAGNOSIS — E119 Type 2 diabetes mellitus without complications: Secondary | ICD-10-CM | POA: Diagnosis not present

## 2019-06-27 DIAGNOSIS — Y999 Unspecified external cause status: Secondary | ICD-10-CM | POA: Diagnosis not present

## 2019-06-27 DIAGNOSIS — S199XXA Unspecified injury of neck, initial encounter: Secondary | ICD-10-CM | POA: Diagnosis not present

## 2019-06-27 LAB — BASIC METABOLIC PANEL
Anion gap: 15 (ref 5–15)
BUN: 16 mg/dL (ref 8–23)
CO2: 22 mmol/L (ref 22–32)
Calcium: 9 mg/dL (ref 8.9–10.3)
Chloride: 94 mmol/L — ABNORMAL LOW (ref 98–111)
Creatinine, Ser: 1.11 mg/dL (ref 0.61–1.24)
GFR calc Af Amer: >60 mL/min (ref >60)
GFR calc non Af Amer: 59 mL/min — ABNORMAL LOW (ref >60)
Glucose, Bld: 176 mg/dL — ABNORMAL HIGH (ref 70–99)
Potassium: 3.7 mmol/L (ref 3.5–5.1)
Sodium: 131 mmol/L — ABNORMAL LOW (ref 135–145)

## 2019-06-27 LAB — CBC WITH DIFFERENTIAL/PLATELET
Abs Immature Granulocytes: 0.03 10*3/uL (ref 0.00–0.07)
Basophils Absolute: 0 10*3/uL (ref 0.0–0.1)
Basophils Relative: 0 %
Eosinophils Absolute: 0 10*3/uL (ref 0.0–0.5)
Eosinophils Relative: 0 %
HCT: 41.5 % (ref 39.0–52.0)
Hemoglobin: 14.6 g/dL (ref 13.0–17.0)
Immature Granulocytes: 0 %
Lymphocytes Relative: 13 %
Lymphs Abs: 1.5 10*3/uL (ref 0.7–4.0)
MCH: 34 pg (ref 26.0–34.0)
MCHC: 35.2 g/dL (ref 30.0–36.0)
MCV: 96.7 fL (ref 80.0–100.0)
Monocytes Absolute: 0.7 10*3/uL (ref 0.1–1.0)
Monocytes Relative: 6 %
Neutro Abs: 9.1 10*3/uL — ABNORMAL HIGH (ref 1.7–7.7)
Neutrophils Relative %: 81 %
Platelets: 180 10*3/uL (ref 150–400)
RBC: 4.29 MIL/uL (ref 4.22–5.81)
RDW: 12.4 % (ref 11.5–15.5)
WBC: 11.3 10*3/uL — ABNORMAL HIGH (ref 4.0–10.5)
nRBC: 0 % (ref 0.0–0.2)

## 2019-06-27 LAB — PROTIME-INR
INR: 2.1 — ABNORMAL HIGH (ref 0.8–1.2)
Prothrombin Time: 23.5 seconds — ABNORMAL HIGH (ref 11.4–15.2)

## 2019-06-27 MED ORDER — TETRACAINE HCL 0.5 % OP SOLN
2.0000 [drp] | Freq: Once | OPHTHALMIC | Status: DC
  Filled 2019-06-27: qty 4

## 2019-06-27 MED ORDER — TETANUS-DIPHTH-ACELL PERTUSSIS 5-2.5-18.5 LF-MCG/0.5 IM SUSP: 0.5000 mL | Freq: Once | INTRAMUSCULAR | Status: DC

## 2019-06-27 MED ORDER — FLUORESCEIN SODIUM 1 MG OP STRP
1.0000 | ORAL_STRIP | Freq: Once | OPHTHALMIC | Status: DC
  Filled 2019-06-27: qty 1

## 2019-06-27 NOTE — ED Triage Notes (Addendum)
Pt states that he tripped and hit his head on tile floor. No LOC. Pt has large hematoma on right side of forehead and right eye is swollen shut. Pt is on xarelto.

## 2019-06-27 NOTE — ED Provider Notes (Signed)
[facility] EMERGENCY DEPARTMENT Provider Note   CSN: [id number] Arrival date & time: 06/27/19  1941     History   Chief Complaint Chief Complaint  Patient presents with  . Head Injury    HPI [name] is a 85 y.o. male with PMHx HTN, stroke currently on Xarelto, Diabetes, GERD, CAD, MI who presents to the ED today for head injury s/p mechanical fall that occurred around 1 PM.  She reports that he tripped over an object at his son's house causing him to fall and hit the right side of his head.  No loss of consciousness.  Patient reports that he was able to get up off the floor after that but his wife made him come to the ED tonight after she saw what his head and face looked like.  Patient's right eye is swollen shut but he states that he can still see out of it.  He denies any pain with movement of his right eye.  Denies any blurry vision or double vision.  Patient states he last took Xarelto around 5 PM today after the fall.  Again he denies any dizziness, lightheadedness, chest pain, shortness of breath prior to the fall.  Patient has no other complaints at this time.  Tetanus is up-to-date.        Past Medical History:  Diagnosis Date  . Arthritis    "wrists, knees, back" (12/10/2015)  . Asthma   . Cerebral atherosclerosis   . Chronic back pain    "lower primarily" (12/10/2015)  . CORONARY ATHEROSCLEROSIS NATIVE CORONARY ARTERY    Myocardial infarction at [facility] in 1996 with a directional atherectomy of the 90% LAD stenosis and medical management of 60-70% proximal circumflex and 50% RCA stenosis.  . DYSLIPIDEMIA   . GERD (gastroesophageal reflux disease)   . History of hiatal hernia   . HYPERTENSION   . ILIAC ARTERY ANEURYSM   . Myocardial infarction ([place]) 1996  . Stroke [facility]) 2003; 2013; 07/2015; 12/05/2015   denies residual on 12/10/2015, 12/13/16, 01/08/17  . TIA (transient ischemic attack)   . Type II diabetes mellitus ([place])   . UPPER RESPIRATORY INFECTION      Patient Active Problem List   Diagnosis Date Noted  . Paroxysmal atrial fibrillation ([place]) 01/19/2018  . History of CVA (cerebrovascular accident) 04/21/2017  . PFO (patent foramen ovale) 12/30/2016  . GERD (gastroesophageal reflux disease) 02/25/2016  . Malnutrition of moderate degree 12/11/2015  . DM type 2 (diabetes mellitus, type 2) ([place]) 12/06/2015  . Dysarthria 12/05/2015  . Metabolic syndrome 75/05/2584  . Vitamin D deficiency 08/01/2013  . PVD (peripheral vascular disease) ([place]) 11/25/2011  . ILIAC ARTERY ANEURYSM 07/17/2010  . Hyperlipidemia associated with type 2 diabetes mellitus ([place]) 08/28/2009  . CORONARY ATHEROSCLEROSIS NATIVE CORONARY ARTERY 08/28/2009  . Hypertension associated with diabetes ([place]) 06/05/2009  . Cerebral atherosclerosis 06/04/2009    Past Surgical History:  Procedure Laterality Date  . ANAL FISSURE REPAIR  1990s  . BASAL CELL CARCINOMA EXCISION Right 12/2015   "temple"  . CATARACT EXTRACTION W/ INTRAOCULAR LENS  IMPLANT, BILATERAL Bilateral 2000s  . CORONARY ANGIOPLASTY  1996  . KNEE CARTILAGE SURGERY Right 1970s  . LOOP RECORDER INSERTION N/A 12/15/2016   Procedure: Loop Recorder Insertion;  Surgeon: Thompson Grayer, MD;  Location: [place] CV LAB;  Service: Cardiovascular;  Laterality: N/A;  . SHOULDER ARTHROSCOPY W/ ROTATOR CUFF REPAIR Left 2000s  . TEE WITHOUT CARDIOVERSION N/A 12/15/2016   Procedure: TRANSESOPHAGEAL ECHOCARDIOGRAM (TEE);  Surgeon: Lelon Perla, MD;  Location: [facility] ENDOSCOPY;  Service: Cardiovascular;  Laterality: N/A;        Home Medications    Prior to Admission medications   Medication Sig Start Date End Date Taking? Authorizing Provider  albuterol (PROVENTIL HFA;VENTOLIN HFA) 108 (90 Base) MCG/ACT inhaler Inhale 1-2 puffs into the lungs every 6 (six) hours as needed for wheezing or shortness of breath. 09/16/18   Dettinger, Fransisca Kaufmann, MD  atorvastatin (LIPITOR) 80 MG tablet Take 1 tablet (80 mg total) by mouth daily  at 6 PM. 02/01/19   Dettinger, Fransisca Kaufmann, MD  benazepril (LOTENSIN) 20 MG tablet Take 1 tablet (20 mg total) by mouth daily. 02/01/19   Dettinger, Fransisca Kaufmann, MD  blood glucose meter kit and supplies KIT Dispense based on patient and insurance preference. Use four times daily as directed.  Diagnosis type 2 diabetes 05/10/19   Dettinger, Fransisca Kaufmann, MD  Cholecalciferol (VITAMIN D) 2000 units CAPS Take 2,000 Units by mouth daily.    [provider]  feeding supplement, GLUCERNA SHAKE, (GLUCERNA SHAKE) LIQD Take 237 mLs by mouth 2 (two) times daily between meals. 12/13/15   Timmothy Euler, MD  fish oil-omega-3 fatty acids 1000 MG capsule Take 2 g by mouth every evening.     [provider]  Fluticasone-Salmeterol (ADVAIR DISKUS) 100-50 MCG/DOSE AEPB INHALE ONE DOSE BY MOUTH EVERY 12 HOURS 03/06/19   Hawks, [place] A, FNP  glucose blood (CONTOUR NEXT TEST) test strip Test blood sugars twice daily 07/12/18   Dettinger, Fransisca Kaufmann, MD  hydrALAZINE (APRESOLINE) 25 MG tablet Take 1 tablet (25 mg total) by mouth daily as needed. 05/10/19   Dettinger, Fransisca Kaufmann, MD  hydrochlorothiazide (HYDRODIURIL) 25 MG tablet Take 1 tablet (25 mg total) by mouth daily. 02/01/19   Dettinger, Fransisca Kaufmann, MD  metFORMIN (GLUCOPHAGE-XR) 500 MG 24 hr tablet Take 1 tablet (500 mg total) by mouth 2 (two) times daily. 02/01/19   Dettinger, Fransisca Kaufmann, MD  metoprolol tartrate (LOPRESSOR) 100 MG tablet Take 1 tablet (100 mg total) by mouth 2 (two) times daily. 02/01/19   Dettinger, Fransisca Kaufmann, MD  nitroGLYCERIN (NITROSTAT) 0.4 MG SL tablet USE AS DIRECTED 06/04/17   Timmothy Euler, MD  pantoprazole (PROTONIX) 40 MG tablet Take 1 tablet (40 mg total) by mouth daily. 09/08/18   Dettinger, Fransisca Kaufmann, MD  rivaroxaban (XARELTO) 20 MG TABS tablet TAKE 1 TABLET BY MOUTH EVERY DAY WITH SUPPER 02/01/19   Dettinger, Fransisca Kaufmann, MD    Family History Family History  Problem Relation Age of Onset  . Coronary artery disease Mother   . Cancer Father         lung  . Stroke Father   . Cancer Brother        lung  . Stroke Sister     Social History Social History   Tobacco Use  . Smoking status: Former Smoker    Packs/day: 0.12    Years: 45.00    Pack years: 5.40    Types: Cigarettes    Quit date: 11/24/1984    Years since quitting: 34.6  . Smokeless tobacco: Former Systems developer    Types: Snuff  . Tobacco comment: "dipped snuff when I was a boy"  Substance Use Topics  . Alcohol use: No    Alcohol/week: 0.0 standard drinks  . Drug use: No     Allergies   Alteplase, Codeine, Lopid [gemfibrozil], and Voltaren [diclofenac sodium]   Review of Systems Review of Systems  Constitutional: Negative for chills and fever.  HENT: Negative for congestion.   Eyes: Negative for visual disturbance.  Respiratory: Negative for shortness of breath.   Cardiovascular: Negative for chest pain.  Gastrointestinal: Negative for nausea and vomiting.  Genitourinary: Negative for difficulty urinating.  Musculoskeletal: Negative for back pain.  Skin: Positive for color change (ecchymosis to right eye).  Neurological: Positive for headaches. Negative for syncope.     Physical Exam Updated Vital Signs BP (!) 164/94 (BP Location: Left Arm)   Pulse 87   Temp 97.8 F (36.6 C) (Oral)   Resp 16   Ht '5\' 11"'$  (1.803 m)   Wt 88.9 kg   SpO2 96%   BMI 27.34 kg/m   Physical Exam Vitals signs and nursing note reviewed.  Constitutional:      Appearance: He is not ill-appearing.  HENT:     Head:     Comments: See photo below.  Large hematoma noted to right frontal scalp.   Diffuse ecchymosis and periorbital swelling noted to right eye; able to retract lids far enough back to visualize EOM intact in right eye; pt reports he is able to see the light and has no vision changes.   No battle's sign. Negative hemotympanum bilaterally.  Eyes:     Extraocular Movements: Extraocular movements intact.     Pupils: Pupils are equal, round, and reactive to light.      Comments: Able to retract lids successfully with paperclips. No hyphema. No fluorescein uptake or siedel sign.   Neck:     Musculoskeletal: Neck supple.     Comments: No midline C spine tenderness Cardiovascular:     Rate and Rhythm: Normal rate and regular rhythm.     Pulses: Normal pulses.  Pulmonary:     Effort: Pulmonary effort is normal.     Breath sounds: Normal breath sounds. No wheezing, rhonchi or rales.  Abdominal:     Palpations: Abdomen is soft.     Tenderness: There is no abdominal tenderness. There is no guarding or rebound.  Musculoskeletal:     Comments: No T or L midline spinal tenderness. No paraspinal muscular tenderness through back. ROM intact throughout neck and back. No tenderness to all joints including shoulders, elbows, wrists, hips, knees, or ankles. Moving all extremities without difficulty. Strength 5/5 to BUE and BLEs. Sensation intact throughout. 2+ radial and DP pulse.   Skin:    General: Skin is warm and dry.  Neurological:     General: No focal deficit present.     Mental Status: He is alert and oriented to person, place, and time.     Cranial Nerves: No cranial nerve deficit.        ED Treatments / Results  Labs (all labs ordered are listed, but only abnormal results are displayed) Labs Reviewed  CBC WITH DIFFERENTIAL/PLATELET - Abnormal; Notable for the following components:      Result Value   WBC 11.3 (*)    Neutro Abs 9.1 (*)    All other components within normal limits  BASIC METABOLIC PANEL - Abnormal; Notable for the following components:   Sodium 131 (*)    Chloride 94 (*)    Glucose, Bld 176 (*)    GFR calc non Af Amer 59 (*)    All other components within normal limits  PROTIME-INR - Abnormal; Notable for the following components:   Prothrombin Time 23.5 (*)    INR 2.1 (*)    All other components within normal limits  EKG None  Radiology Ct Head Wo Contrast  Result Date: 06/27/2019 CLINICAL DATA:  Fall, head  injury EXAM: CT HEAD WITHOUT CONTRAST CT MAXILLOFACIAL WITHOUT CONTRAST CT CERVICAL SPINE WITHOUT CONTRAST TECHNIQUE: Multidetector CT imaging of the head, cervical spine, and maxillofacial structures were performed using the standard protocol without intravenous contrast. Multiplanar CT image reconstructions of the cervical spine and maxillofacial structures were also generated. COMPARISON:  CT head 08/13/2017 FINDINGS: CT HEAD FINDINGS Brain: Mild to moderate atrophy without hydrocephalus. Chronic white matter ischemia bilaterally. Negative for acute intracranial hemorrhage. No acute infarct or mass. Negative for hyperdense vessel Vascular: Negative for hyperdense vessel Skull: Negative for fracture Other: None CT MAXILLOFACIAL FINDINGS Osseous: Negative for facial fracture Orbits: Large periorbital hematoma in the soft tissues on the right. No extension into the orbit. Bilateral cataract surgery. Sinuses: Negative Soft tissues: Large hematoma in the right periorbital soft tissues. Atherosclerotic calcification in the carotid artery bilaterally. CT CERVICAL SPINE FINDINGS Alignment: Normal alignment. Skull base and vertebrae: Negative for fracture Soft tissues and spinal canal: Vascular calcification. No soft tissue mass or swelling. Disc levels: Advanced disc degeneration and spurring throughout the cervical spine. Facet degeneration throughout the cervical spine. Multilevel spinal and foraminal stenosis due to spurring. Upper chest: Lung apices clear bilaterally Other: None IMPRESSION: 1. No acute intracranial abnormality 2. Negative for facial fracture. Large right periorbital hematoma in the soft tissues. 3. Advanced cervical spondylosis.  Negative for fracture. Electronically Signed   By: Franchot Gallo M.D.   On: 06/27/2019 21:40   Ct Cervical Spine Wo Contrast  Result Date: 06/27/2019 CLINICAL DATA:  Fall, head injury EXAM: CT HEAD WITHOUT CONTRAST CT MAXILLOFACIAL WITHOUT CONTRAST CT CERVICAL SPINE  WITHOUT CONTRAST TECHNIQUE: Multidetector CT imaging of the head, cervical spine, and maxillofacial structures were performed using the standard protocol without intravenous contrast. Multiplanar CT image reconstructions of the cervical spine and maxillofacial structures were also generated. COMPARISON:  CT head 08/13/2017 FINDINGS: CT HEAD FINDINGS Brain: Mild to moderate atrophy without hydrocephalus. Chronic white matter ischemia bilaterally. Negative for acute intracranial hemorrhage. No acute infarct or mass. Negative for hyperdense vessel Vascular: Negative for hyperdense vessel Skull: Negative for fracture Other: None CT MAXILLOFACIAL FINDINGS Osseous: Negative for facial fracture Orbits: Large periorbital hematoma in the soft tissues on the right. No extension into the orbit. Bilateral cataract surgery. Sinuses: Negative Soft tissues: Large hematoma in the right periorbital soft tissues. Atherosclerotic calcification in the carotid artery bilaterally. CT CERVICAL SPINE FINDINGS Alignment: Normal alignment. Skull base and vertebrae: Negative for fracture Soft tissues and spinal canal: Vascular calcification. No soft tissue mass or swelling. Disc levels: Advanced disc degeneration and spurring throughout the cervical spine. Facet degeneration throughout the cervical spine. Multilevel spinal and foraminal stenosis due to spurring. Upper chest: Lung apices clear bilaterally Other: None IMPRESSION: 1. No acute intracranial abnormality 2. Negative for facial fracture. Large right periorbital hematoma in the soft tissues. 3. Advanced cervical spondylosis.  Negative for fracture. Electronically Signed   By: Franchot Gallo M.D.   On: 06/27/2019 21:40   Ct Maxillofacial Wo Contrast  Result Date: 06/27/2019 CLINICAL DATA:  Fall, head injury EXAM: CT HEAD WITHOUT CONTRAST CT MAXILLOFACIAL WITHOUT CONTRAST CT CERVICAL SPINE WITHOUT CONTRAST TECHNIQUE: Multidetector CT imaging of the head, cervical spine, and  maxillofacial structures were performed using the standard protocol without intravenous contrast. Multiplanar CT image reconstructions of the cervical spine and maxillofacial structures were also generated. COMPARISON:  CT head 08/13/2017 FINDINGS: CT HEAD FINDINGS Brain: Mild to moderate  atrophy without hydrocephalus. Chronic white matter ischemia bilaterally. Negative for acute intracranial hemorrhage. No acute infarct or mass. Negative for hyperdense vessel Vascular: Negative for hyperdense vessel Skull: Negative for fracture Other: None CT MAXILLOFACIAL FINDINGS Osseous: Negative for facial fracture Orbits: Large periorbital hematoma in the soft tissues on the right. No extension into the orbit. Bilateral cataract surgery. Sinuses: Negative Soft tissues: Large hematoma in the right periorbital soft tissues. Atherosclerotic calcification in the carotid artery bilaterally. CT CERVICAL SPINE FINDINGS Alignment: Normal alignment. Skull base and vertebrae: Negative for fracture Soft tissues and spinal canal: Vascular calcification. No soft tissue mass or swelling. Disc levels: Advanced disc degeneration and spurring throughout the cervical spine. Facet degeneration throughout the cervical spine. Multilevel spinal and foraminal stenosis due to spurring. Upper chest: Lung apices clear bilaterally Other: None IMPRESSION: 1. No acute intracranial abnormality 2. Negative for facial fracture. Large right periorbital hematoma in the soft tissues. 3. Advanced cervical spondylosis.  Negative for fracture. Electronically Signed   By: Franchot Gallo M.D.   On: 06/27/2019 21:40    Procedures Procedures (including critical care time)  Medications Ordered in ED Medications  fluorescein ophthalmic strip 1 strip (has no administration in time range)  tetracaine (PONTOCAINE) 0.5 % ophthalmic solution 2 drop (has no administration in time range)     Initial Impression / Assessment and Plan / ED Course  I have reviewed  the triage vital signs and the nursing notes.  Pertinent labs & imaging results that were available during my care of the patient were reviewed by me and considered in my medical decision making (see chart for details).    85 year old male currently anticoagulated who presents to the ED after mechanical fall where he hit his head around 1 PM today.  Presents into the ED around 8 PM today.  States he took Xarelto dose around 5 PM after fall.  No loss of consciousness with fall.  Patient has large hematoma to right side of forehead as well as his right eye is swollen shut, see pictures above.  Patient complaining of mild headache otherwise in no acute distress.  Has no other complaints at this time.  States wife made him come in.  Vital signs are stable today.  Patient afebrile without tachycardia or tachypnea.  Blood pressure is normotensive.  He has no midline spinal tenderness on exam but given fall and elderly age will obtain CT C-spine as well as CT head and CT maxillofacial.  Dr. Wyvonnia Dusky attending physician evaluated patient as well.  We were able to retract I just enough to visualize extraocular movements were intact.  Will try to evaluate for any corneal abrasions on the patient complaining of any eye pain today.   Blood cell count mildly elevated at 11,000 although likely more due to injury.  BMP with mildly decreased sodium at 131 and chloride 94.  No other electrolyte abnormalities.  Glucose mildly elevated at 176.  INR within normal limits at 2.1.   CT head, CT maxillofacial, CT C-spine without any acute findings.  Does have large hematoma and periorbital swelling.  Dr. Oswaldo Milian had discussed with radiologist Dr. Carlis Abbott who does not appreciate any retrobulbar hemorrhage.  Will attempt fluorescein test and visual acuity prior to discharge.   No corneal abrasions noted with fluorescein.  No Seidel sign.  No hyphema.  Consult ophthalmology so that patient can have close outpatient follow-up once  swelling diminishes. Did not perform ocular pressures as do not feel they would be reliable given amount  of swelling periorbitally. Visual acuity unable to be obtained due to amount of swelling.   Multiple attempts were made to consult ophthamology; unfortunately secretary continues to be rerouted to office number. Will have patient call first thing tomorrow morning to schedule an appointment for further evaluation. Have advised he follow up with his PCP regarding Xarelto; question if pt would benefit holding dose for the next couple of days given amount of swelling from hematoma formation. Pt advised to apply ice to area as well. Strict return precautions discussed with pt. He is in agreement with plan at this time and stable for discharge home.   This note was prepared using Dragon voice recognition software and may include unintentional dictation errors due to the inherent limitations of voice recognition software.       Final Clinical Impressions(s) / ED Diagnoses   Final diagnoses:  Injury of head, initial encounter  Anticoagulated  Periorbital hematoma of right eye    ED Discharge Orders    None       Eustaquio Maize, PA-C 06/28/19 0043    Ezequiel Essex, MD 06/28/19 0127

## 2019-06-28 ENCOUNTER — Telehealth: Payer: Self-pay | Admitting: Family Medicine

## 2019-06-28 NOTE — Discharge Instructions (Signed)
Please call Dr. Baird Cancer office in the morning to schedule an appointment for further evaluation of your right eye once the swelling decreases  Follow up with your PCP regarding Xarelto for the next couple of days   Use ice as needed to help with the pain and swelling to your eye and forehead

## 2019-06-28 NOTE — Telephone Encounter (Signed)
Called and spoke with pt - aware in detail again all instructions from Dr Dettinger.

## 2019-06-28 NOTE — Telephone Encounter (Signed)
I agree with the hold on the medication, continue to watch for the bruising and swelling in the hematoma and if it starts back up after he restarts the Xarelto in 1 week then let me know and we may have to hold it longer.

## 2019-06-28 NOTE — Telephone Encounter (Signed)
Called [name] - aware per verbal with Dr Warrick Parisian - he may come off of Xorelto for 1 week. And then resume.  ( holding this med was advised from ER dr that he seen yesterday) when he went for a bad fall.  Vanita Ingles states that his face and head were hit hard. 3 CT scans were done at [facility].   Dr Dettinger - please note confirmation on this medication hold as well.

## 2019-07-03 NOTE — Progress Notes (Signed)
Carelink Summary Report / Loop Recorder 

## 2019-07-05 ENCOUNTER — Telehealth: Payer: Self-pay | Admitting: Family Medicine

## 2019-07-05 DIAGNOSIS — S0011XA Contusion of right eyelid and periocular area, initial encounter: Secondary | ICD-10-CM | POA: Diagnosis not present

## 2019-07-05 DIAGNOSIS — H43813 Vitreous degeneration, bilateral: Secondary | ICD-10-CM | POA: Diagnosis not present

## 2019-07-05 DIAGNOSIS — W01198A Fall on same level from slipping, tripping and stumbling with subsequent striking against other object, initial encounter: Secondary | ICD-10-CM | POA: Diagnosis not present

## 2019-07-05 LAB — HM DIABETES EYE EXAM

## 2019-07-05 NOTE — Telephone Encounter (Signed)
Patient was taking off of Xarelto a week ago for fall. You sent him to opthalmology and everything turned out fine. Can he go back on it today and take it like he was. Please advise?

## 2019-07-05 NOTE — Telephone Encounter (Signed)
Patient 's wife aware and verbalized understanding. °

## 2019-07-05 NOTE — Telephone Encounter (Signed)
Yes go ahead and restart it at his usual dose, if he does develop any increased swelling or increased bruising then give [place] a call

## 2019-07-07 ENCOUNTER — Other Ambulatory Visit: Payer: Self-pay | Admitting: Family Medicine

## 2019-07-07 ENCOUNTER — Ambulatory Visit (INDEPENDENT_AMBULATORY_CARE_PROVIDER_SITE_OTHER): Payer: Medicare Other | Admitting: *Deleted

## 2019-07-07 DIAGNOSIS — Z8673 Personal history of transient ischemic attack (TIA), and cerebral infarction without residual deficits: Secondary | ICD-10-CM | POA: Diagnosis not present

## 2019-07-08 LAB — CUP PACEART REMOTE DEVICE CHECK
Date Time Interrogation Session: 20201204095438
Implantable Pulse Generator Implant Date: 20180515

## 2019-07-11 DIAGNOSIS — E1142 Type 2 diabetes mellitus with diabetic polyneuropathy: Secondary | ICD-10-CM | POA: Diagnosis not present

## 2019-07-11 DIAGNOSIS — B351 Tinea unguium: Secondary | ICD-10-CM | POA: Diagnosis not present

## 2019-07-11 DIAGNOSIS — M79676 Pain in unspecified toe(s): Secondary | ICD-10-CM | POA: Diagnosis not present

## 2019-07-11 DIAGNOSIS — L84 Corns and callosities: Secondary | ICD-10-CM | POA: Diagnosis not present

## 2019-08-09 ENCOUNTER — Other Ambulatory Visit: Payer: Self-pay

## 2019-08-09 ENCOUNTER — Ambulatory Visit (INDEPENDENT_AMBULATORY_CARE_PROVIDER_SITE_OTHER): Payer: Medicare Other | Admitting: *Deleted

## 2019-08-09 DIAGNOSIS — Z8673 Personal history of transient ischemic attack (TIA), and cerebral infarction without residual deficits: Secondary | ICD-10-CM | POA: Diagnosis not present

## 2019-08-09 LAB — CUP PACEART REMOTE DEVICE CHECK
Date Time Interrogation Session: 20210106100022
Implantable Pulse Generator Implant Date: 20180515

## 2019-08-10 ENCOUNTER — Ambulatory Visit (INDEPENDENT_AMBULATORY_CARE_PROVIDER_SITE_OTHER): Payer: Medicare Other | Admitting: Family Medicine

## 2019-08-10 ENCOUNTER — Encounter: Payer: Self-pay | Admitting: Family Medicine

## 2019-08-10 ENCOUNTER — Other Ambulatory Visit: Payer: Self-pay

## 2019-08-10 VITALS — BP 144/78 | HR 61 | Temp 98.9°F | Ht 71.0 in | Wt 186.0 lb

## 2019-08-10 DIAGNOSIS — E1169 Type 2 diabetes mellitus with other specified complication: Secondary | ICD-10-CM

## 2019-08-10 DIAGNOSIS — K219 Gastro-esophageal reflux disease without esophagitis: Secondary | ICD-10-CM

## 2019-08-10 DIAGNOSIS — I480 Paroxysmal atrial fibrillation: Secondary | ICD-10-CM | POA: Diagnosis not present

## 2019-08-10 DIAGNOSIS — E1159 Type 2 diabetes mellitus with other circulatory complications: Secondary | ICD-10-CM

## 2019-08-10 DIAGNOSIS — I10 Essential (primary) hypertension: Secondary | ICD-10-CM

## 2019-08-10 DIAGNOSIS — E785 Hyperlipidemia, unspecified: Secondary | ICD-10-CM | POA: Diagnosis not present

## 2019-08-10 LAB — BAYER DCA HB A1C WAIVED: HB A1C (BAYER DCA - WAIVED): 6 % (ref <7.0)

## 2019-08-10 MED ORDER — RIVAROXABAN 20 MG PO TABS: ORAL_TABLET | ORAL | 6 refills | Status: DC

## 2019-08-10 NOTE — Progress Notes (Signed)
BP (!) 144/78   Pulse 61   Temp 98.9 F (37.2 C) (Temporal)   Ht '5\' 11"'$  (1.803 m)   Wt 186 lb (84.4 kg)   SpO2 98%   BMI 25.94 kg/m    Subjective:   Patient ID: [name], male    DOB: 18-Feb-1930, 86 y.o.   MRN: [id number]  HPI: [name] is a 86 y.o. male presenting on 08/10/2019 for Diabetes (3 month follow up) and Hyperlipidemia   HPI Patient did have one fall where he ended up with a very large hematoma around his right eye and down the right side of his face a couple months ago and is following up from that.  He did get his eye examined and everything looks good on that regards  Type 2 diabetes mellitus Patient comes in today for recheck of his diabetes. Patient has been currently taking Metformin 500, A1c is 6.9 today which is controlled for his age. Patient is currently on an ACE inhibitor/ARB. Patient has seen an ophthalmologist this year. Patient denies any issues with their feet.  Hypertension Patient is currently on benazepril and hydralazine and hydrochlorothiazide and metoprolol, and their blood pressure today is 144/78. Patient denies any lightheadedness or dizziness. Patient denies headaches, blurred vision, chest pains, shortness of breath, or weakness. Denies any side effects from medication and is content with current medication.   Hyperlipidemia Patient is coming in for recheck of his hyperlipidemia. The patient is currently taking atorvastatin and fish oil. They deny any issues with myalgias or history of liver damage from it. They deny any focal numbness or weakness or chest pain.   A. fib recheck Reason on anticoagulation: A. fib Patient denies any bruising or bleeding or chest pain or palpitations   GERD Patient is currently on pantoprazole.  She denies any major symptoms or abdominal pain or belching or burping. She denies any blood in her stool or lightheadedness or dizziness.   Relevant past medical, surgical, family and social history reviewed  and updated as indicated. Interim medical history since our last visit reviewed. Allergies and medications reviewed and updated.  Review of Systems  Constitutional: Negative for chills and fever.  HENT: Negative for ear pain and tinnitus.   Eyes: Negative for pain.  Respiratory: Negative for cough, shortness of breath and wheezing.   Cardiovascular: Negative for chest pain, palpitations and leg swelling.  Gastrointestinal: Negative for abdominal pain, blood in stool, constipation and diarrhea.  Genitourinary: Negative for dysuria and hematuria.  Musculoskeletal: Negative for back pain, gait problem and myalgias.  Skin: Negative for rash.  Neurological: Negative for dizziness, weakness, light-headedness and headaches.  Psychiatric/Behavioral: Negative for suicidal ideas.  All other systems reviewed and are negative.   Per HPI unless specifically indicated above   Allergies as of 08/10/2019      Reactions   Alteplase Anaphylaxis   Codeine Other (See Comments)   Bad dreams   Lopid [gemfibrozil] Nausea Only   Voltaren [diclofenac Sodium] Diarrhea      Medication List       Accurate as of August 10, 2019 11:12 AM. If you have any questions, ask your nurse or doctor.        Accu-Chek FastClix Lancets Misc Test BS QID Dx E11.9   Accu-Chek Guide test strip Generic drug: glucose blood Test BS QID Dx E11.9   albuterol 108 (90 Base) MCG/ACT inhaler Commonly known as: VENTOLIN HFA INHALE 1 TO 2 PUFFS BY MOUTH EVERY 6 HOURS AS  NEEDED FOR WHEEZING OR SHORTNESS OF BREATH   atorvastatin 80 MG tablet Commonly known as: LIPITOR Take 1 tablet (80 mg total) by mouth daily at 6 PM.   benazepril 20 MG tablet Commonly known as: LOTENSIN Take 1 tablet (20 mg total) by mouth daily.   blood glucose meter kit and supplies Kit Dispense based on patient and insurance preference. Use four times daily as directed.  Diagnosis type 2 diabetes   feeding supplement (GLUCERNA SHAKE) Liqd Take  237 mLs by mouth 2 (two) times daily between meals.   fish oil-omega-3 fatty acids 1000 MG capsule Take 2 g by mouth every evening.   Fluticasone-Salmeterol 100-50 MCG/DOSE Aepb Commonly known as: Advair Diskus INHALE ONE DOSE BY MOUTH EVERY 12 HOURS   hydrALAZINE 25 MG tablet Commonly known as: APRESOLINE Take 1 tablet (25 mg total) by mouth daily as needed.   hydrochlorothiazide 25 MG tablet Commonly known as: HYDRODIURIL Take 1 tablet (25 mg total) by mouth daily.   metFORMIN 500 MG 24 hr tablet Commonly known as: GLUCOPHAGE-XR Take 1 tablet (500 mg total) by mouth 2 (two) times daily.   metoprolol tartrate 100 MG tablet Commonly known as: LOPRESSOR Take 1 tablet (100 mg total) by mouth 2 (two) times daily.   nitroGLYCERIN 0.4 MG SL tablet Commonly known as: NITROSTAT USE AS DIRECTED   pantoprazole 40 MG tablet Commonly known as: PROTONIX Take 1 tablet (40 mg total) by mouth daily.   rivaroxaban 20 MG Tabs tablet Commonly known as: Xarelto TAKE 1 TABLET BY MOUTH EVERY DAY WITH SUPPER   Vitamin D 50 MCG (2000 UT) Caps Take 2,000 Units by mouth daily.        Objective:   BP (!) 144/78   Pulse 61   Temp 98.9 F (37.2 C) (Temporal)   Ht '5\' 11"'$  (1.803 m)   Wt 186 lb (84.4 kg)   SpO2 98%   BMI 25.94 kg/m   Wt Readings from Last 3 Encounters:  08/10/19 186 lb (84.4 kg)  06/27/19 196 lb (88.9 kg)  05/10/19 188 lb 9.6 oz (85.5 kg)    Physical Exam Vitals and nursing note reviewed.  Constitutional:      General: He is not in acute distress.    Appearance: He is well-developed. He is not diaphoretic.  Eyes:     General: No scleral icterus.    Conjunctiva/sclera: Conjunctivae normal.  Neck:     Thyroid: No thyromegaly.  Cardiovascular:     Rate and Rhythm: Normal rate and regular rhythm.     Heart sounds: Normal heart sounds. No murmur.  Pulmonary:     Effort: Pulmonary effort is normal. No respiratory distress.     Breath sounds: Normal breath  sounds. No wheezing.  Musculoskeletal:        General: No swelling. Normal range of motion.     Cervical back: Neck supple.  Lymphadenopathy:     Cervical: No cervical adenopathy.  Skin:    General: Skin is warm and dry.     Findings: No rash.  Neurological:     Mental Status: He is alert and oriented to person, place, and time.     Coordination: Coordination normal.  Psychiatric:        Behavior: Behavior normal.       Assessment & Plan:   Problem List Items Addressed This Visit      Cardiovascular and Mediastinum   Hypertension associated with diabetes ([place])   Relevant Medications   rivaroxaban (XARELTO)  20 MG TABS tablet   Other Relevant Orders   CMP14+EGFR   Paroxysmal atrial fibrillation (HCC)   Relevant Medications   rivaroxaban (XARELTO) 20 MG TABS tablet     Digestive   GERD (gastroesophageal reflux disease)   Relevant Orders   CBC with Differential/Platelet     Endocrine   Hyperlipidemia associated with type 2 diabetes mellitus ([place])   Relevant Orders   Lipid panel   DM type 2 (diabetes mellitus, type 2) ([place]) - Primary   Relevant Orders   hgba1c   CMP14+EGFR      Patient had one episode of a large hematoma after fall, he tripped, no lightheadedness or dizziness, will continue to monitor closely for future falls and blood thinner.  We will test blood counts today.  No change in medications, seems to be doing well and blood pressure seems to be controlled for him and his A1c is 6.9. Follow up plan: Return in about 3 months (around 11/08/2019), or if symptoms worsen or fail to improve, for Diabetes and hypertension and cholesterol and A. fib and GERD.  Counseling provided for all of the vaccine components Orders Placed This Encounter  Procedures  . hgba1c    Caryl Pina, MD [facility] 08/10/2019, 11:12 AM

## 2019-08-11 LAB — CMP14+EGFR
ALT: 16 IU/L (ref 0–44)
AST: 20 IU/L (ref 0–40)
Albumin/Globulin Ratio: 2 (ref 1.2–2.2)
Albumin: 4.3 g/dL (ref 3.6–4.6)
Alkaline Phosphatase: 89 IU/L (ref 39–117)
BUN/Creatinine Ratio: 13 (ref 10–24)
BUN: 14 mg/dL (ref 8–27)
Bilirubin Total: 1.1 mg/dL (ref 0.0–1.2)
CO2: 24 mmol/L (ref 20–29)
Calcium: 9.2 mg/dL (ref 8.6–10.2)
Chloride: 98 mmol/L (ref 96–106)
Creatinine, Ser: 1.11 mg/dL (ref 0.76–1.27)
GFR calc Af Amer: 69 mL/min/{1.73_m2} (ref >59)
GFR calc non Af Amer: 59 mL/min/{1.73_m2} — ABNORMAL LOW (ref >59)
Globulin, Total: 2.2 g/dL (ref 1.5–4.5)
Glucose: 134 mg/dL — ABNORMAL HIGH (ref 65–99)
Potassium: 4 mmol/L (ref 3.5–5.2)
Sodium: 137 mmol/L (ref 134–144)
Total Protein: 6.5 g/dL (ref 6.0–8.5)

## 2019-08-11 LAB — CBC WITH DIFFERENTIAL/PLATELET
Basophils Absolute: 0 10*3/uL (ref 0.0–0.2)
Basos: 0 %
EOS (ABSOLUTE): 0.3 10*3/uL (ref 0.0–0.4)
Eos: 5 %
Hematocrit: 41 % (ref 37.5–51.0)
Hemoglobin: 14.5 g/dL (ref 13.0–17.7)
Immature Grans (Abs): 0 10*3/uL (ref 0.0–0.1)
Immature Granulocytes: 0 %
Lymphocytes Absolute: 1.8 10*3/uL (ref 0.7–3.1)
Lymphs: 25 %
MCH: 33.6 pg — ABNORMAL HIGH (ref 26.6–33.0)
MCHC: 35.4 g/dL (ref 31.5–35.7)
MCV: 95 fL (ref 79–97)
Monocytes Absolute: 0.6 10*3/uL (ref 0.1–0.9)
Monocytes: 8 %
Neutrophils Absolute: 4.6 10*3/uL (ref 1.4–7.0)
Neutrophils: 62 %
Platelets: 189 10*3/uL (ref 150–450)
RBC: 4.31 x10E6/uL (ref 4.14–5.80)
RDW: 11.5 % — ABNORMAL LOW (ref 11.6–15.4)
WBC: 7.4 10*3/uL (ref 3.4–10.8)

## 2019-08-11 LAB — LIPID PANEL
Chol/HDL Ratio: 2.3 ratio (ref 0.0–5.0)
Cholesterol, Total: 77 mg/dL — ABNORMAL LOW (ref 100–199)
HDL: 34 mg/dL — ABNORMAL LOW (ref >39)
LDL Chol Calc (NIH): 26 mg/dL (ref 0–99)
Triglycerides: 78 mg/dL (ref 0–149)
VLDL Cholesterol Cal: 17 mg/dL (ref 5–40)

## 2019-08-15 ENCOUNTER — Encounter: Payer: Self-pay | Admitting: Family Medicine

## 2019-08-16 DIAGNOSIS — Z23 Encounter for immunization: Secondary | ICD-10-CM | POA: Diagnosis not present

## 2019-08-29 DIAGNOSIS — J343 Hypertrophy of nasal turbinates: Secondary | ICD-10-CM | POA: Diagnosis not present

## 2019-08-29 DIAGNOSIS — J328 Other chronic sinusitis: Secondary | ICD-10-CM | POA: Diagnosis not present

## 2019-08-29 DIAGNOSIS — J329 Chronic sinusitis, unspecified: Secondary | ICD-10-CM | POA: Diagnosis not present

## 2019-08-29 DIAGNOSIS — J342 Deviated nasal septum: Secondary | ICD-10-CM | POA: Diagnosis not present

## 2019-08-29 DIAGNOSIS — H903 Sensorineural hearing loss, bilateral: Secondary | ICD-10-CM | POA: Diagnosis not present

## 2019-08-29 DIAGNOSIS — H6123 Impacted cerumen, bilateral: Secondary | ICD-10-CM | POA: Diagnosis not present

## 2019-08-29 DIAGNOSIS — H918X1 Other specified hearing loss, right ear: Secondary | ICD-10-CM | POA: Diagnosis not present

## 2019-08-30 ENCOUNTER — Other Ambulatory Visit: Payer: Self-pay | Admitting: Family Medicine

## 2019-08-31 ENCOUNTER — Other Ambulatory Visit: Payer: Self-pay | Admitting: Family Medicine

## 2019-08-31 ENCOUNTER — Telehealth: Payer: Self-pay | Admitting: *Deleted

## 2019-08-31 NOTE — Telephone Encounter (Signed)
A message was left, re: his follow up visit with Dr.Hochrein.

## 2019-09-11 ENCOUNTER — Ambulatory Visit (INDEPENDENT_AMBULATORY_CARE_PROVIDER_SITE_OTHER): Payer: Medicare Other | Admitting: *Deleted

## 2019-09-11 DIAGNOSIS — Z8673 Personal history of transient ischemic attack (TIA), and cerebral infarction without residual deficits: Secondary | ICD-10-CM | POA: Diagnosis not present

## 2019-09-11 LAB — CUP PACEART REMOTE DEVICE CHECK
Date Time Interrogation Session: 20210207233226
Implantable Pulse Generator Implant Date: 20180515

## 2019-09-12 NOTE — Progress Notes (Signed)
ILR Remote 

## 2019-09-13 DIAGNOSIS — Z23 Encounter for immunization: Secondary | ICD-10-CM | POA: Diagnosis not present

## 2019-09-19 DIAGNOSIS — M79676 Pain in unspecified toe(s): Secondary | ICD-10-CM | POA: Diagnosis not present

## 2019-09-19 DIAGNOSIS — B351 Tinea unguium: Secondary | ICD-10-CM | POA: Diagnosis not present

## 2019-09-19 DIAGNOSIS — E1142 Type 2 diabetes mellitus with diabetic polyneuropathy: Secondary | ICD-10-CM | POA: Diagnosis not present

## 2019-09-19 DIAGNOSIS — L84 Corns and callosities: Secondary | ICD-10-CM | POA: Diagnosis not present

## 2019-09-29 ENCOUNTER — Telehealth: Payer: Self-pay | Admitting: Family Medicine

## 2019-09-29 ENCOUNTER — Other Ambulatory Visit: Payer: Self-pay | Admitting: Family Medicine

## 2019-09-29 MED ORDER — ACCU-CHEK FASTCLIX LANCETS MISC: 3 refills | Status: DC

## 2019-09-29 NOTE — Chronic Care Management (AMB) (Signed)
  Chronic Care Management   Outreach Note  09/29/2019 Name: [name] MRN: [id number] DOB: 1930-05-11  [name] is a 86 y.o. year old male who is a primary care patient of Dettinger, Fransisca Kaufmann, MD. I reached out to [name] by phone today in response to a referral sent by [name] [facility] health plan.     An unsuccessful telephone outreach was attempted today. The patient was referred to the case management team for assistance with care management and care coordination.   Follow Up Plan: A HIPPA compliant phone message was left for the patient providing contact information and requesting a return call.  The care management team will reach out to the patient again over the next 7 days.  If patient returns call to provider office, please advise to call [place] at [place], [place], [place], [place] [address] Direct Dial: [phone] [email or web address] Website: [email or web address]

## 2019-09-29 NOTE — Telephone Encounter (Signed)
Failed. resent

## 2019-09-29 NOTE — Addendum Note (Signed)
Addended by: Antonietta Barcelona D on: 09/29/2019 11:28 AM   Modules accepted: Orders

## 2019-10-02 NOTE — Chronic Care Management (AMB) (Signed)
  Chronic Care Management   Note  10/02/2019 Name: [name] MRN: [id number] DOB: [birth year]  [name] is a 86 y.o. year old male who is a primary care patient of Dettinger, Fransisca Kaufmann, MD. I reached out to [name] by phone today in response to a referral sent by [name] [facility] health plan.     [name] was given information about Chronic Care Management services today including:  1. CCM service includes personalized support from designated clinical staff supervised by his physician, including individualized plan of care and coordination with other care providers 2. 24/7 contact phone numbers for assistance for urgent and routine care needs. 3. Service will only be billed when office clinical staff spend 20 minutes or more in a month to coordinate care. 4. Only one practitioner may furnish and bill the service in a calendar month. 5. The patient may stop CCM services at any time (effective at the end of the month) by phone call to the office staff. 6. The patient will be responsible for cost sharing (co-pay) of up to 20% of the service fee (after annual deductible is met).  Patient agreed to services and verbal consent obtained.   Follow up plan: Telephone appointment with care management team member scheduled for:11/16/2019  Noreene Larsson, [place], [place], [place] [address] Direct Dial: [phone] [email or web address] Website: [email or web address]

## 2019-10-04 ENCOUNTER — Encounter: Payer: Self-pay | Admitting: *Deleted

## 2019-10-09 DIAGNOSIS — Z7189 Other specified counseling: Secondary | ICD-10-CM | POA: Insufficient documentation

## 2019-10-09 NOTE — Progress Notes (Signed)
Cardiology Office Note   Date:  10/11/2019   ID:  [name], [name] 08-03-1930, MRN [id number]  PCP:  Dettinger, Fransisca Kaufmann, MD  Cardiologist:   Minus Breeding, MD   No chief complaint on file.     History of Present Illness: [name] is a 86 y.o. male who presents for followup of his known coronary disease.  He had a previous TIA  He had a loop recorder placed.  He did have a TEE demonstrating a small PFO. Doppler was negative for DVT.  MRI demonstrated small acute infarcts in the MCA distribution. He had an implanted loop monitor.  He was found to have atrial fib and was started on Xarelto.    Since I last saw him he has done well.  He denies any chest pressure, neck or arm discomfort.  He has not had any palpitations, presyncope or syncope.  He has not had any new shortness of breath, PND or orthopnea.  He has had no weight gain or edema.   Past Medical History:  Diagnosis Date  . Arthritis    "wrists, knees, back" (12/10/2015)  . Asthma   . Cerebral atherosclerosis   . Chronic back pain    "lower primarily" (12/10/2015)  . CORONARY ATHEROSCLEROSIS NATIVE CORONARY ARTERY    Myocardial infarction at [facility] in 1996 with a directional atherectomy of the 90% LAD stenosis and medical management of 60-70% proximal circumflex and 50% RCA stenosis.  . DYSLIPIDEMIA   . GERD (gastroesophageal reflux disease)   . History of hiatal hernia   . HYPERTENSION   . ILIAC ARTERY ANEURYSM   . Myocardial infarction ([place]) 1996  . Stroke [facility]) 2003; 2013; 07/2015; 12/05/2015   denies residual on 12/10/2015, 12/13/16, 01/08/17  . TIA (transient ischemic attack)   . Type II diabetes mellitus ([place])   . UPPER RESPIRATORY INFECTION     Past Surgical History:  Procedure Laterality Date  . ANAL FISSURE REPAIR  1990s  . BASAL CELL CARCINOMA EXCISION Right 12/2015   "temple"  . CATARACT EXTRACTION W/ INTRAOCULAR LENS  IMPLANT, BILATERAL Bilateral 2000s  . CORONARY ANGIOPLASTY  1996  . KNEE  CARTILAGE SURGERY Right 1970s  . LOOP RECORDER INSERTION N/A 12/15/2016   Procedure: Loop Recorder Insertion;  Surgeon: Thompson Grayer, MD;  Location: [facility] CV LAB;  Service: Cardiovascular;  Laterality: N/A;  . SHOULDER ARTHROSCOPY W/ ROTATOR CUFF REPAIR Left 2000s  . TEE WITHOUT CARDIOVERSION N/A 12/15/2016   Procedure: TRANSESOPHAGEAL ECHOCARDIOGRAM (TEE);  Surgeon: Lelon Perla, MD;  Location: [facility] ENDOSCOPY;  Service: Cardiovascular;  Laterality: N/A;     Current Outpatient Medications  Medication Sig Dispense Refill  . albuterol (VENTOLIN HFA) 108 (90 Base) MCG/ACT inhaler INHALE 1 TO 2 PUFFS BY MOUTH EVERY 6 HOURS AS NEEDED FOR WHEEZING FOR SHORTNESS OF BREATH 27 g 0  . atorvastatin (LIPITOR) 80 MG tablet Take 1 tablet (80 mg total) by mouth daily at 6 PM. 90 tablet 3  . benazepril (LOTENSIN) 20 MG tablet Take 1 tablet (20 mg total) by mouth daily. 90 tablet 3  . Cholecalciferol (VITAMIN D) 2000 units CAPS Take 2,000 Units by mouth daily.    . feeding supplement, GLUCERNA SHAKE, (GLUCERNA SHAKE) LIQD Take 237 mLs by mouth 2 (two) times daily between meals. 60 Can 11  . fish oil-omega-3 fatty acids 1000 MG capsule Take 2 g by mouth every evening.     . Fluticasone-Salmeterol (ADVAIR DISKUS) 100-50 MCG/DOSE AEPB INHALE ONE DOSE  BY MOUTH EVERY 12 HOURS 180 each 2  . hydrALAZINE (APRESOLINE) 25 MG tablet Take 1 tablet (25 mg total) by mouth daily as needed. 90 tablet 3  . hydrochlorothiazide (HYDRODIURIL) 25 MG tablet Take 1 tablet (25 mg total) by mouth daily. 90 tablet 3  . metFORMIN (GLUCOPHAGE-XR) 500 MG 24 hr tablet Take 1 tablet (500 mg total) by mouth 2 (two) times daily. 180 tablet 3  . metoprolol tartrate (LOPRESSOR) 100 MG tablet Take 1 tablet (100 mg total) by mouth 2 (two) times daily. 180 tablet 3  . nitroGLYCERIN (NITROSTAT) 0.4 MG SL tablet USE AS DIRECTED 25 tablet 1  . pantoprazole (PROTONIX) 40 MG tablet Take 1 tablet by mouth daily 90 tablet 0  . rivaroxaban  (XARELTO) 20 MG TABS tablet TAKE 1 TABLET BY MOUTH EVERY DAY WITH SUPPER 30 tablet 6  . Accu-Chek FastClix Lancets MISC CHECK GLUCOSE 4 TIMES DAILY Dx E11.9 408 each 3  . blood glucose meter kit and supplies KIT Dispense based on patient and insurance preference. Use four times daily as directed.  Diagnosis type 2 diabetes 1 each 0  . glucose blood (ACCU-CHEK GUIDE) test strip Test BS QID Dx E11.9 400 each 3   No current facility-administered medications for this visit.    Allergies:   Alteplase, Codeine, Lopid [gemfibrozil], and Voltaren [diclofenac sodium]    ROS:  Please see the history of present illness.   Otherwise, review of systems are positive for none.   All other systems are reviewed and negative.    PHYSICAL EXAM: VS:  BP 122/78   Pulse 62   Ht '5\' 11"'$  (1.803 m)   Wt 188 lb (85.3 kg)   BMI 26.22 kg/m  , BMI Body mass index is 26.22 kg/m. GENERAL:  Well appearing NECK:  No jugular venous distention, waveform within normal limits, carotid upstroke brisk and symmetric, no bruits, no thyromegaly LUNGS:  Clear to auscultation bilaterally CHEST:  Unremarkable HEART:  PMI not displaced or sustained,S1 and S2 within normal limits, no S3, no S4, no clicks, no rubs, 2 out of 6 apical systolic murmur radiating slightly up aortic outflow tract, no diastolic murmurs ABD:  Flat, positive bowel sounds normal in frequency in pitch, no bruits, no rebound, no guarding, no midline pulsatile mass, no hepatomegaly, no splenomegaly EXT:  2 plus pulses throughout, no edema, no cyanosis no clubbing     EKG:  EKG is ordered today. The ekg ordered today demonstrates sinus rhythm, rate 62, axis within normal limits, intervals within normal limits, no acute ST-T wave changes.   Recent Labs: 08/10/2019: ALT 16; BUN 14; Creatinine, Ser 1.11; Hemoglobin 14.5; Platelets 189; Potassium 4.0; Sodium 137    Lipid Panel    Component Value Date/Time   CHOL 77 (L) 08/10/2019 1152   CHOL 132 02/02/2013  0853   TRIG 78 08/10/2019 1152   TRIG 123 02/08/2015 1009   TRIG 172 (H) 02/02/2013 0853   HDL 34 (L) 08/10/2019 1152   HDL 41 02/08/2015 1009   HDL 33 (L) 02/02/2013 0853   CHOLHDL 2.3 08/10/2019 1152   CHOLHDL 2.7 04/22/2017 0158   VLDL 12 04/22/2017 0158   LDLCALC 26 08/10/2019 1152   LDLCALC 49 05/02/2014 0934   LDLCALC 65 02/02/2013 0853      Wt Readings from Last 3 Encounters:  10/11/19 188 lb (85.3 kg)  08/10/19 186 lb (84.4 kg)  06/27/19 196 lb (88.9 kg)      Other studies Reviewed: Additional studies/ records  that were reviewed today include: Labs. Review of the above records demonstrates:  Please see elsewhere in the note.     ASSESSMENT AND PLAN:   TIA: He is on Xarelto.  He tolerates anticoagulation.  No change in therapy.  ATRIAL FIB:  Mr. JOEVANNI RODDEY has a CHA2DS2 - VASc score of 6.   He has had no recurrence of this and I did review his device interrogation.  He is probably nearing the end of the battery life on his implanted device and he would not care to have it extracted.  PFO:  This is an incidental finding.    No change in therapy.  CAD:    The patient has no new sypmtoms.  No further cardiovascular testing is indicated.  We will continue with aggressive risk reduction and meds as listed.  HTN:   The blood pressure is well controlled.  No change in therapy.   HYPERLIPIDEMIA:    LDL is remarkably low.  No change in therapy.  CAROTID STENOSIS:    This is mild.    No further imaging.   DM:   His A1C was 6.0.  No change in therapy.   COVID EDUCATION: He and his wife have both had vaccination.   Current medicines are reviewed at length with the patient today.  The patient does not have concerns regarding medicines.  The following changes have been made:  no change  Labs/ tests ordered today include: None No orders of the defined types were placed in this encounter.    Disposition:   FU with me in one year.     Signed, Minus Breeding,  MD  10/11/2019 11:30 AM    [facility]

## 2019-10-11 ENCOUNTER — Ambulatory Visit: Payer: Medicare Other | Admitting: Cardiology

## 2019-10-11 ENCOUNTER — Other Ambulatory Visit: Payer: Self-pay

## 2019-10-11 ENCOUNTER — Encounter: Payer: Self-pay | Admitting: Cardiology

## 2019-10-11 VITALS — BP 122/78 | HR 62 | Ht 71.0 in | Wt 188.0 lb

## 2019-10-11 DIAGNOSIS — Z7189 Other specified counseling: Secondary | ICD-10-CM

## 2019-10-11 DIAGNOSIS — E785 Hyperlipidemia, unspecified: Secondary | ICD-10-CM

## 2019-10-11 DIAGNOSIS — I480 Paroxysmal atrial fibrillation: Secondary | ICD-10-CM | POA: Diagnosis not present

## 2019-10-11 DIAGNOSIS — E118 Type 2 diabetes mellitus with unspecified complications: Secondary | ICD-10-CM | POA: Diagnosis not present

## 2019-10-11 DIAGNOSIS — I10 Essential (primary) hypertension: Secondary | ICD-10-CM | POA: Diagnosis not present

## 2019-10-11 DIAGNOSIS — I2510 Atherosclerotic heart disease of native coronary artery without angina pectoris: Secondary | ICD-10-CM

## 2019-10-11 NOTE — Patient Instructions (Signed)
Medication Instructions:  The current medical regimen is effective;  continue present plan and medications.  *If you need a refill on your cardiac medications before your next appointment, please call your pharmacy*  Follow-Up: At [facility], you and your health needs are our priority.  As part of our continuing mission to provide you with exceptional heart care, we have created designated Provider Care Teams.  These Care Teams include your primary Cardiologist (physician) and Advanced Practice Providers (APPs -  Physician Assistants and Nurse Practitioners) who all work together to provide you with the care you need, when you need it.  We recommend signing up for the patient portal called "MyChart".  Sign up information is provided on this After Visit Summary.  MyChart is used to connect with patients for Virtual Visits (Telemedicine).  Patients are able to view lab/test results, encounter notes, upcoming appointments, etc.  Non-urgent messages can be sent to your provider as well.   To learn more about what you can do with MyChart, go to [email or web address].    Your next appointment:   12 month(s)  The format for your next appointment:   In Person  Provider:   James Hochrein, MD   Thank you for choosing [facility]!!     

## 2019-10-12 ENCOUNTER — Ambulatory Visit (INDEPENDENT_AMBULATORY_CARE_PROVIDER_SITE_OTHER): Payer: Medicare Other | Admitting: *Deleted

## 2019-10-12 DIAGNOSIS — Z8673 Personal history of transient ischemic attack (TIA), and cerebral infarction without residual deficits: Secondary | ICD-10-CM

## 2019-10-12 LAB — CUP PACEART REMOTE DEVICE CHECK
Date Time Interrogation Session: 20210311001615
Implantable Pulse Generator Implant Date: 20180515

## 2019-10-13 NOTE — Progress Notes (Signed)
ILR Remote 

## 2019-10-19 NOTE — Addendum Note (Signed)
Addended by: Shellia Cleverly on: 10/19/2019 01:10 PM   Modules accepted: Orders

## 2019-11-10 ENCOUNTER — Ambulatory Visit: Payer: Medicare Other | Admitting: Family Medicine

## 2019-11-13 ENCOUNTER — Ambulatory Visit (INDEPENDENT_AMBULATORY_CARE_PROVIDER_SITE_OTHER): Payer: Medicare Other | Admitting: *Deleted

## 2019-11-13 DIAGNOSIS — Z8673 Personal history of transient ischemic attack (TIA), and cerebral infarction without residual deficits: Secondary | ICD-10-CM

## 2019-11-13 LAB — CUP PACEART REMOTE DEVICE CHECK
Date Time Interrogation Session: 20210411031301
Implantable Pulse Generator Implant Date: 20180515

## 2019-11-14 NOTE — Progress Notes (Signed)
ILR Remote 

## 2019-11-16 ENCOUNTER — Ambulatory Visit: Payer: Medicare Other | Admitting: *Deleted

## 2019-11-16 DIAGNOSIS — I152 Hypertension secondary to endocrine disorders: Secondary | ICD-10-CM

## 2019-11-16 DIAGNOSIS — E1159 Type 2 diabetes mellitus with other circulatory complications: Secondary | ICD-10-CM

## 2019-11-16 NOTE — Chronic Care Management (AMB) (Signed)
  Chronic Care Management   Initial Visit Note  11/16/2019 Name: OLEH KEATH MRN: [id number] DOB: 1930-03-31  Referred by: Dettinger, Fransisca Kaufmann, MD Reason for referral : Chronic Care Management (Initial Visit)   An unsuccessful Initial Telephone Visit was attempted today. The patient was referred to the case management team for assistance with care management and care coordination.   RN Care Plan   . Chronic Disease Management Needs       CARE PLAN ENTRY (see longtitudinal plan of care for additional care plan information)  Current Barriers:  . Chronic Disease Management support, education, and care coordination needs related to HTN, PVD, Afib, DM, HLD, hx of CVA and MI  Clinical Goals: . Over the next 10 days, patient will be contacted by a Care Guide to reschedule their Initial CCM Visit . Over the next 30 days, patient will have an Initial CCM Visit with a member of the embedded CCM team to discuss self-management of their chronic medical conditions  Interventions: . Chart reviewed in preparation for initial visit telephone call . Collaboration with other care team members as needed . Unsuccessful outreach to patient  . A HIPPA compliant phone message was left for the patient providing contact information and requesting a return call.  . Request sent to care guides to reach out and reschedule patient's initial visit  Patient Self Care Activities: . Undetermined   Initial goal documentation         Follow Up Plan: A HIPPA compliant phone message was left for the patient providing contact information and requesting a return call.  The care management team will reach out to the patient again over the next 10 days.    Chong Sicilian, BSN, RN-BC Embedded Chronic Care Manager [facility] [place] Management Direct Dial: [phone]

## 2019-11-16 NOTE — Patient Instructions (Signed)
Visit Information  Goals Addressed            This Visit's Progress   . Chronic Disease Management Needs       CARE PLAN ENTRY (see longtitudinal plan of care for additional care plan information)  Current Barriers:  . Chronic Disease Management support, education, and care coordination needs related to HTN, PVD, Afib, DM, HLD, hx of CVA and MI  Clinical Goals: . Over the next 10 days, patient will be contacted by a Care Guide to reschedule their Initial CCM Visit . Over the next 30 days, patient will have an Initial CCM Visit with a member of the embedded CCM team to discuss self-management of their chronic medical conditions  Interventions: . Chart reviewed in preparation for initial visit telephone call . Collaboration with other care team members as needed . Unsuccessful outreach to patient  . A HIPPA compliant phone message was left for the patient providing contact information and requesting a return call.  . Request sent to care guides to reach out and reschedule patient's initial visit  Patient Self Care Activities: . Undetermined   Initial goal documentation        Follow-up Plan A HIPPA compliant phone message was left for the patient providing contact information and requesting a return call.  The care management team will reach out to the patient again over the next 10 days.   Chong Sicilian, BSN, RN-BC Embedded Chronic Care Manager [facility] [place] Management Direct Dial: [phone]

## 2019-11-17 ENCOUNTER — Telehealth: Payer: Self-pay | Admitting: Family Medicine

## 2019-11-17 NOTE — Chronic Care Management (AMB) (Signed)
  Care Management   Note  11/17/2019 Name: [name] MRN: [id number] DOB: Aug 08, 1929  [name] is a 86 y.o. year old male who is a primary care patient of Dettinger, Fransisca Kaufmann, MD and is actively engaged with the care management team. I reached out to [name] by phone today to assist with re-scheduling an initial visit with the RN Case Manager  Follow up plan: Unsuccessful telephone outreach attempt made. A HIPPA compliant phone message was left for the patient providing contact information and requesting a return call. If patient returns call to provider office, please advise to call [place] at [phone].  [address], [place] [address] Direct Dial: [phone] [email or web address]: [email or web address]

## 2019-11-20 NOTE — Chronic Care Management (AMB) (Signed)
  Care Management   Note  11/20/2019 Name: [name] MRN: [id number] DOB: [birth year]  [name] is a 86 y.o. year old male who is a primary care patient of Dettinger, Fransisca Kaufmann, MD and is actively engaged with the care management team. I reached out to [name] by phone today to assist with re-scheduling an initial visit with the RN Case Manager  Follow up plan: Patient declines further follow up and engagement by the care management team. Appropriate care team members and provider have been notified via electronic communication. The care management team is available to follow up with the patient after provider conversation with the patient regarding recommendation for care management engagement and subsequent re-referral to the care management team. If patient returns call to provider office, please advise to call [place] at [phone].  [place], [place] [address] Direct Dial: [phone] [email or web address]: [email or web address]

## 2019-11-23 ENCOUNTER — Encounter: Payer: Self-pay | Admitting: Family Medicine

## 2019-11-23 ENCOUNTER — Ambulatory Visit (INDEPENDENT_AMBULATORY_CARE_PROVIDER_SITE_OTHER): Payer: Medicare Other | Admitting: Family Medicine

## 2019-11-23 ENCOUNTER — Other Ambulatory Visit: Payer: Self-pay

## 2019-11-23 VITALS — BP 151/82 | HR 58 | Temp 97.5°F | Ht 71.0 in | Wt 186.5 lb

## 2019-11-23 DIAGNOSIS — E1159 Type 2 diabetes mellitus with other circulatory complications: Secondary | ICD-10-CM

## 2019-11-23 DIAGNOSIS — K219 Gastro-esophageal reflux disease without esophagitis: Secondary | ICD-10-CM | POA: Diagnosis not present

## 2019-11-23 DIAGNOSIS — E1169 Type 2 diabetes mellitus with other specified complication: Secondary | ICD-10-CM | POA: Diagnosis not present

## 2019-11-23 DIAGNOSIS — I10 Essential (primary) hypertension: Secondary | ICD-10-CM

## 2019-11-23 DIAGNOSIS — E785 Hyperlipidemia, unspecified: Secondary | ICD-10-CM

## 2019-11-23 LAB — BAYER DCA HB A1C WAIVED: HB A1C (BAYER DCA - WAIVED): 6.4 % (ref <7.0)

## 2019-11-23 NOTE — Progress Notes (Signed)
BP (!) 151/82   Pulse (!) 58   Temp (!) 97.5 F (36.4 C) (Temporal)   Ht 5' 11" (1.803 m)   Wt 186 lb 8 oz (84.6 kg)   BMI 26.01 kg/m    Subjective:   Patient ID: [name], male    DOB: 14-Mar-1930, 86 y.o.   MRN: [id number]  HPI: [name] is a 86 y.o. male presenting on 11/23/2019 for Medical Management of Chronic Issues   HPI Type 2 diabetes mellitus Patient comes in today for recheck of his diabetes. Patient has been currently taking Metformin. Patient is currently on an ACE inhibitor/ARB. Patient has not seen an ophthalmologist this year. Patient denies any issues with their feet.   Hypertension Patient is currently on benazepril and hydralazine and hydrochlorothiazide and metoprolol, and their blood pressure today is 151/82. Patient denies any lightheadedness or dizziness. Patient denies headaches, blurred vision, chest pains, shortness of breath, or weakness. Denies any side effects from medication and is content with current medication.   Hyperlipidemia Patient is coming in for recheck of his hyperlipidemia. The patient is currently taking fish oil and atorvastatin. They deny any issues with myalgias or history of liver damage from it. They deny any focal numbness or weakness or chest pain.   GERD Patient is currently on pantoprazole.  She denies any major symptoms or abdominal pain or belching or burping. She denies any blood in her stool or lightheadedness or dizziness.   Relevant past medical, surgical, family and social history reviewed and updated as indicated. Interim medical history since our last visit reviewed. Allergies and medications reviewed and updated.  Review of Systems  Constitutional: Negative for chills and fever.  Respiratory: Negative for shortness of breath and wheezing.   Cardiovascular: Negative for chest pain and leg swelling.  Musculoskeletal: Negative for back pain and gait problem.  Skin: Negative for rash.  Neurological: Negative  for dizziness, weakness and numbness.  All other systems reviewed and are negative.   Per HPI unless specifically indicated above   Allergies as of 11/23/2019      Reactions   Alteplase Anaphylaxis   Codeine Other (See Comments)   Bad dreams   Lopid [gemfibrozil] Nausea Only   Voltaren [diclofenac Sodium] Diarrhea      Medication List       Accurate as of November 23, 2019 11:06 AM. If you have any questions, ask your nurse or doctor.        Accu-Chek FastClix Lancets Misc CHECK GLUCOSE 4 TIMES DAILY Dx E11.9   Accu-Chek Guide test strip Generic drug: glucose blood Test BS QID Dx E11.9   albuterol 108 (90 Base) MCG/ACT inhaler Commonly known as: VENTOLIN HFA INHALE 1 TO 2 PUFFS BY MOUTH EVERY 6 HOURS AS NEEDED FOR WHEEZING FOR SHORTNESS OF BREATH   atorvastatin 80 MG tablet Commonly known as: LIPITOR Take 1 tablet (80 mg total) by mouth daily at 6 PM.   benazepril 20 MG tablet Commonly known as: LOTENSIN Take 1 tablet (20 mg total) by mouth daily.   blood glucose meter kit and supplies Kit Dispense based on patient and insurance preference. Use four times daily as directed.  Diagnosis type 2 diabetes   feeding supplement (GLUCERNA SHAKE) Liqd Take 237 mLs by mouth 2 (two) times daily between meals.   fish oil-omega-3 fatty acids 1000 MG capsule Take 2 g by mouth every evening.   Fluticasone-Salmeterol 100-50 MCG/DOSE Aepb Commonly known as: Advair Diskus INHALE ONE DOSE BY  MOUTH EVERY 12 HOURS   hydrALAZINE 25 MG tablet Commonly known as: APRESOLINE Take 1 tablet (25 mg total) by mouth daily as needed.   hydrochlorothiazide 25 MG tablet Commonly known as: HYDRODIURIL Take 1 tablet (25 mg total) by mouth daily.   metFORMIN 500 MG 24 hr tablet Commonly known as: GLUCOPHAGE-XR Take 1 tablet (500 mg total) by mouth 2 (two) times daily.   metoprolol tartrate 100 MG tablet Commonly known as: LOPRESSOR Take 1 tablet (100 mg total) by mouth 2 (two) times  daily.   nitroGLYCERIN 0.4 MG SL tablet Commonly known as: NITROSTAT USE AS DIRECTED   pantoprazole 40 MG tablet Commonly known as: PROTONIX Take 1 tablet by mouth daily   rivaroxaban 20 MG Tabs tablet Commonly known as: Xarelto TAKE 1 TABLET BY MOUTH EVERY DAY WITH SUPPER   Vitamin D 50 MCG (2000 UT) Caps Take 2,000 Units by mouth daily.        Objective:   BP (!) 151/82   Pulse (!) 58   Temp (!) 97.5 F (36.4 C) (Temporal)   Ht 5' 11" (1.803 m)   Wt 186 lb 8 oz (84.6 kg)   BMI 26.01 kg/m   Wt Readings from Last 3 Encounters:  11/23/19 186 lb 8 oz (84.6 kg)  10/11/19 188 lb (85.3 kg)  08/10/19 186 lb (84.4 kg)    Physical Exam Vitals and nursing note reviewed.  Constitutional:      General: He is not in acute distress.    Appearance: He is well-developed. He is not diaphoretic.  Eyes:     General: No scleral icterus.       Right eye: No discharge.     Conjunctiva/sclera: Conjunctivae normal.     Pupils: Pupils are equal, round, and reactive to light.  Neck:     Thyroid: No thyromegaly.  Cardiovascular:     Rate and Rhythm: Normal rate. Rhythm irregular.     Heart sounds: Murmur present. Crescendo  decrescendo  systolic murmur present with a grade of 3/6.  Pulmonary:     Effort: Pulmonary effort is normal. No respiratory distress.     Breath sounds: Normal breath sounds. No wheezing.  Musculoskeletal:        General: Normal range of motion.     Cervical back: Neck supple.  Lymphadenopathy:     Cervical: No cervical adenopathy.  Skin:    General: Skin is warm and dry.     Findings: No rash.  Neurological:     Mental Status: He is alert and oriented to person, place, and time.     Coordination: Coordination normal.  Psychiatric:        Behavior: Behavior normal.       Assessment & Plan:   Problem List Items Addressed This Visit      Cardiovascular and Mediastinum   Hypertension associated with diabetes (HCC)     Digestive   GERD  (gastroesophageal reflux disease)     Endocrine   Hyperlipidemia associated with type 2 diabetes mellitus (HCC)   DM type 2 (diabetes mellitus, type 2) (HCC) - Primary   Relevant Orders   Bayer DCA Hb A1c Waived (Completed)    Patient has only taking Metformin once a day currently, his A1c went up from 6.0-6.4 but is still okay so we will keep him off the Metformin once a day, the Metformin twice a day gave him diarrhea so he backed off, keep the once a day for now, he is tolerating   we will keep it there.  Follow up plan: Return in about 3 months (around 02/22/2020), or if symptoms worsen or fail to improve, for Diabetes and hypertension.  Counseling provided for all of the vaccine components Orders Placed This Encounter  Procedures  . [facility] Hb A1c [place], MD [facility] 11/23/2019, 11:06 AM

## 2019-11-24 ENCOUNTER — Other Ambulatory Visit: Payer: Self-pay | Admitting: Family Medicine

## 2019-11-27 IMAGING — CT CT CERVICAL SPINE W/O CM
2 of 11 series · 7 of 33 positions shown, 9 images · non-contrast
Comparison: CT head 08/13/2017

CLINICAL DATA: Fall, head injury

EXAM:
CT HEAD WITHOUT CONTRAST
CT MAXILLOFACIAL WITHOUT CONTRAST
CT CERVICAL SPINE WITHOUT CONTRAST
TECHNIQUE: Multidetector CT imaging of the head, cervical spine, and
maxillofacial structures were performed using the standard protocol
without intravenous contrast. Multiplanar CT image reconstructions
of the cervical spine and maxillofacial structures were also
generated.

[Series 9: soft thins · axial · 0.33mm/px · z∈[-102,+11]mm · 5 of 284 slices shown, 7 images]
[im 48/284  soft-tissue]
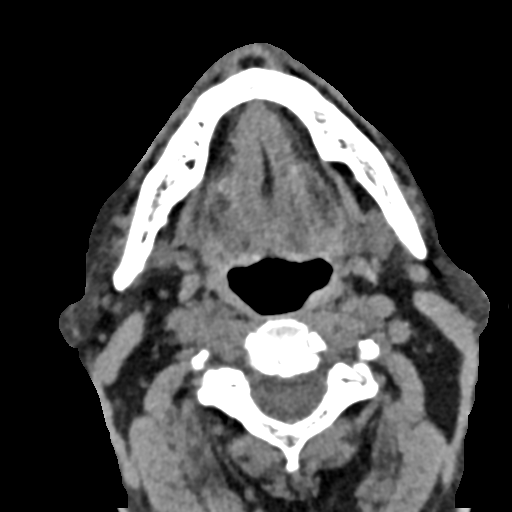
[im 48/284  bone]
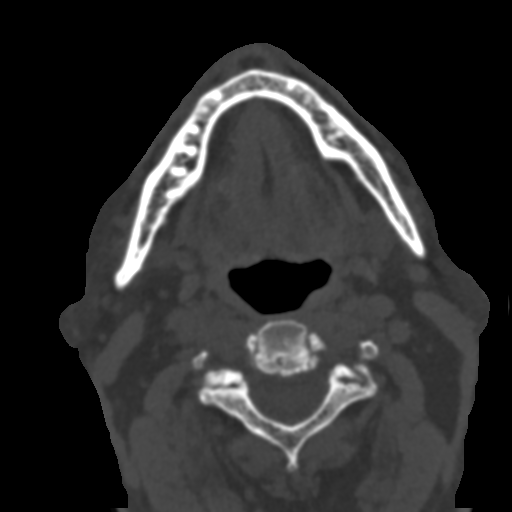
[im 95/284  bone]
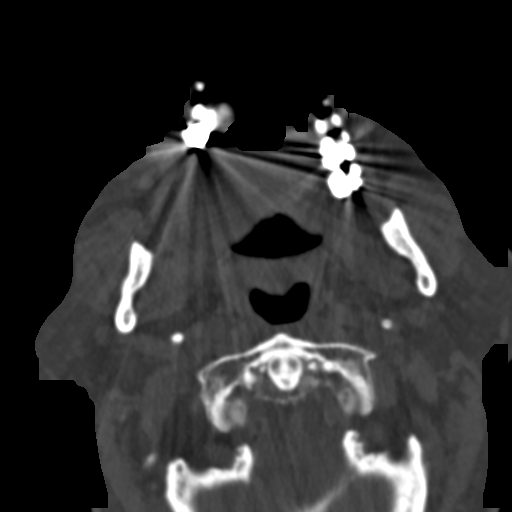
[im 142/284  bone]
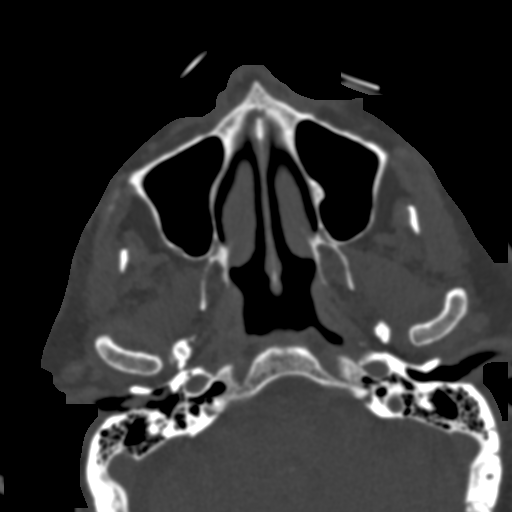
[im 189/284  bone]
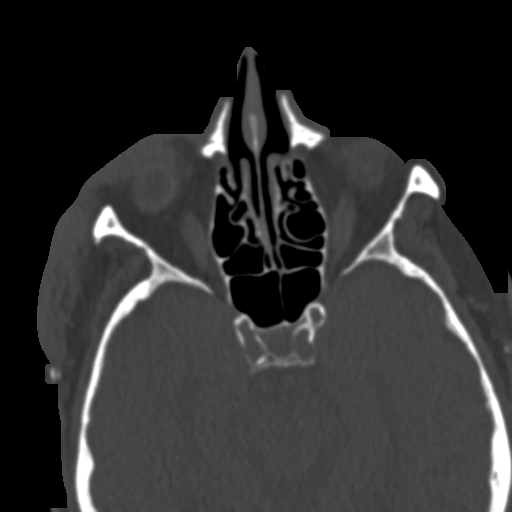
[im 236/284  soft-tissue]
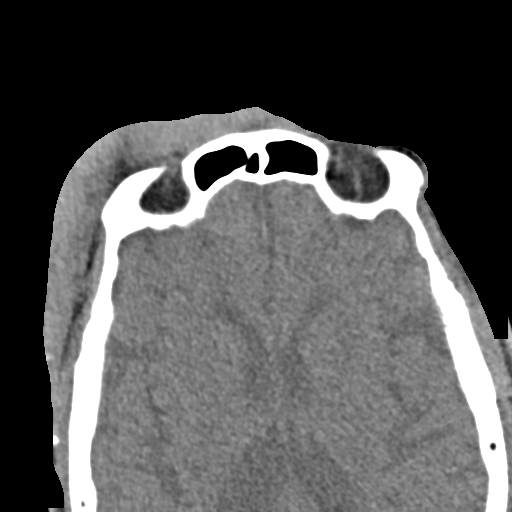
[im 236/284  bone]
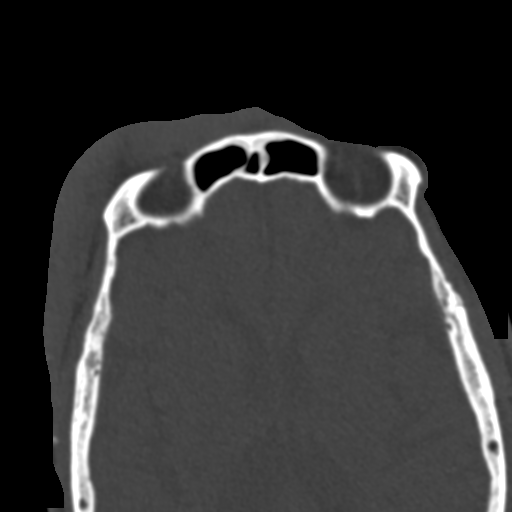

[Series 17: sag bone · sagittal · 0.25mm/px · 2 of 61 slices shown]
[im 21/61  bone]
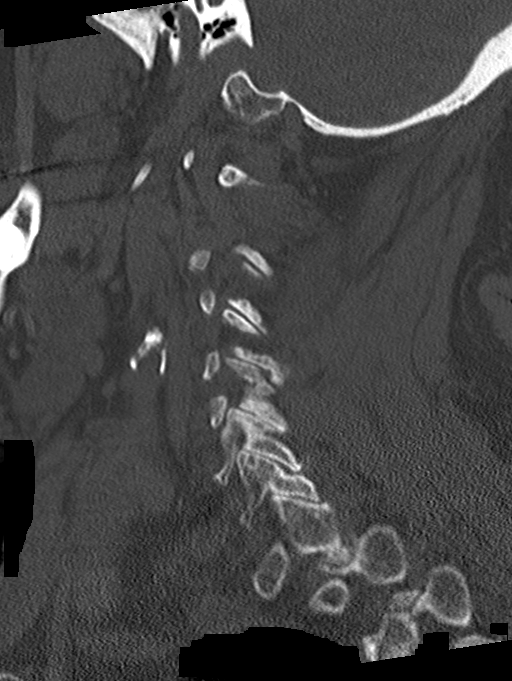
[im 41/61  bone]
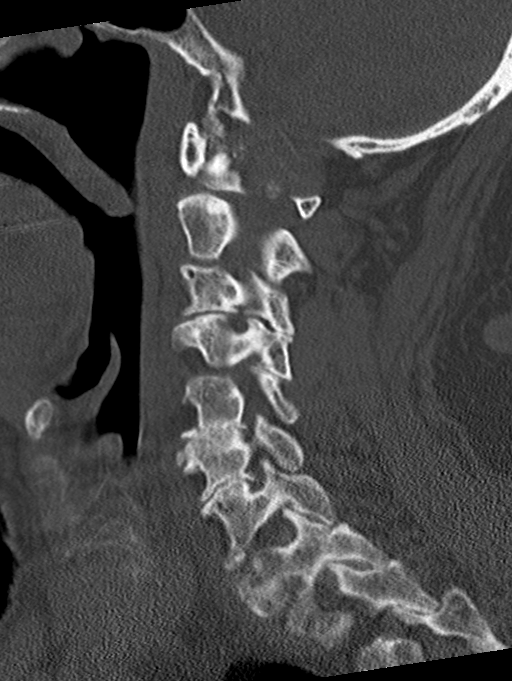

[7 of 33 positions shown; findings below may reference images not displayed]

FINDINGS: CT HEAD FINDINGS

Brain: Mild to moderate atrophy without hydrocephalus. Chronic white
matter ischemia bilaterally.

Negative for acute intracranial hemorrhage. No acute infarct or
mass. Negative for hyperdense vessel

Vascular: Negative for hyperdense vessel

Skull: Negative for fracture

Other: None

CT MAXILLOFACIAL FINDINGS

Osseous: Negative for facial fracture

Orbits: Large periorbital hematoma in the soft tissues on the right.
No extension into the orbit. Bilateral cataract surgery.

Sinuses: Negative

Soft tissues: Large hematoma in the right periorbital soft tissues.

Atherosclerotic calcification in the carotid artery bilaterally.

CT CERVICAL SPINE FINDINGS

Alignment: Normal alignment.

Skull base and vertebrae: Negative for fracture

Soft tissues and spinal canal: Vascular calcification. No soft
tissue mass or swelling.

Disc levels: Advanced disc degeneration and spurring throughout the
cervical spine. Facet degeneration throughout the cervical spine.
Multilevel spinal and foraminal stenosis due to spurring.

Upper chest: Lung apices clear bilaterally

Other: None
IMPRESSION: 1. No acute intracranial abnormality
2. Negative for facial fracture. Large right periorbital hematoma in
the soft tissues.
3. Advanced cervical spondylosis.  Negative for fracture.

## 2019-11-28 DIAGNOSIS — M79676 Pain in unspecified toe(s): Secondary | ICD-10-CM | POA: Diagnosis not present

## 2019-11-28 DIAGNOSIS — L84 Corns and callosities: Secondary | ICD-10-CM | POA: Diagnosis not present

## 2019-11-28 DIAGNOSIS — B351 Tinea unguium: Secondary | ICD-10-CM | POA: Diagnosis not present

## 2019-11-28 DIAGNOSIS — E1142 Type 2 diabetes mellitus with diabetic polyneuropathy: Secondary | ICD-10-CM | POA: Diagnosis not present

## 2019-12-06 ENCOUNTER — Telehealth: Payer: Self-pay | Admitting: Cardiology

## 2019-12-06 ENCOUNTER — Telehealth: Payer: Self-pay

## 2019-12-06 NOTE — Telephone Encounter (Signed)
Spoke with patient to inform of disconnected monitor. °

## 2019-12-06 NOTE — Telephone Encounter (Signed)
Patient's wife states they are sending him a new monitor and it should be at their house within 5 days.

## 2019-12-08 NOTE — Telephone Encounter (Signed)
Monitor shipped 12/07/19 per [facility].

## 2019-12-11 ENCOUNTER — Other Ambulatory Visit: Payer: Self-pay | Admitting: Family Medicine

## 2019-12-11 ENCOUNTER — Ambulatory Visit (INDEPENDENT_AMBULATORY_CARE_PROVIDER_SITE_OTHER): Payer: Medicare Other

## 2019-12-11 ENCOUNTER — Other Ambulatory Visit: Payer: Self-pay

## 2019-12-11 DIAGNOSIS — Z0000 Encounter for general adult medical examination without abnormal findings: Secondary | ICD-10-CM | POA: Diagnosis not present

## 2019-12-11 DIAGNOSIS — E1159 Type 2 diabetes mellitus with other circulatory complications: Secondary | ICD-10-CM

## 2019-12-11 MED ORDER — NITROGLYCERIN 0.4 MG SL SUBL: SUBLINGUAL_TABLET | SUBLINGUAL | 1 refills | Status: DC

## 2019-12-11 NOTE — Progress Notes (Signed)
MEDICARE ANNUAL WELLNESS VISIT  12/11/2019  Telephone Visit Disclaimer This Medicare AWV was conducted by telephone due to national recommendations for restrictions regarding the COVID-19 Pandemic (e.g. social distancing).  I verified, using two identifiers, that I am speaking with [name] or their authorized healthcare agent. I discussed the limitations, risks, security, and privacy concerns of performing an evaluation and management service by telephone and the potential availability of an in-person appointment in the future. The patient expressed understanding and agreed to proceed.   Subjective:  [name] is a 86 y.o. male patient of Dettinger, Fransisca Kaufmann, MD who had a Medicare Annual Wellness Visit today via telephone. [name] is Retired from [employer] and lives with his spouse. He has three children and 7 grandchildren. He reports that he is socially active and does interact with friends/family regularly. He is minimally physically active and enjoys spending time with his family.  Patient Care Team: Dettinger, Fransisca Kaufmann, MD as PCP - General (Family Medicine) Minus Breeding, MD as PCP - Cardiology (Cardiology) Lavonna Monarch, MD as Consulting Physician (Dermatology) Clinger, Chrystie Nose, MD (Otolaryngology) Hassell Done Dicky Doe, NP as Nurse Practitioner (Neurology) Minus Breeding, MD as Consulting Physician (Cardiology) Ilean China, RN as Registered Nurse  Advanced Directives 12/11/2019 06/27/2019 12/02/2018 11/08/2017 04/21/2017 01/08/2017 12/13/2016  Does Patient Have a Medical Advance Directive? No No No No No No No  Would patient like information on creating a medical advance directive? No - Patient declined - Yes (MAU/Ambulatory/Procedural Areas - Information given) - No - Patient declined No - Patient declined -    Hospital Utilization Over the Past 12 Months: # of hospitalizations or ER visits: 1 # of surgeries: 0  Review of Systems    Patient reports that  his overall health is better compared to last year.  History obtained from chart review and the patient  Patient Reported Readings (BP, Pulse, CBG, Weight, etc) none  Pain Assessment Pain : 0-10 Pain Score: 6  Pain Type: Chronic pain Pain Location: Knee Pain Orientation: Right, Left Pain Descriptors / Indicators: Aching, Constant Pain Onset: More than a month ago Pain Frequency: Constant Pain Relieving Factors: Tylenol  Pain Relieving Factors: Tylenol  Current Medications & Allergies (verified) Allergies as of 12/11/2019      Reactions   Alteplase Anaphylaxis   Codeine Other (See Comments)   Bad dreams   Lopid [gemfibrozil] Nausea Only   Voltaren [diclofenac Sodium] Diarrhea      Medication List       Accurate as of Dec 11, 2019  9:43 AM. If you have any questions, ask your nurse or doctor.        Accu-Chek FastClix Lancets Misc CHECK GLUCOSE 4 TIMES DAILY Dx E11.9   Accu-Chek Guide test strip Generic drug: glucose blood Test BS QID Dx E11.9   albuterol 108 (90 Base) MCG/ACT inhaler Commonly known as: VENTOLIN HFA INHALE 1 TO 2 PUFFS BY MOUTH EVERY 6 HOURS AS NEEDED FOR WHEEZING OR SHORTNESS OF BREATH   atorvastatin 80 MG tablet Commonly known as: LIPITOR Take 1 tablet (80 mg total) by mouth daily at 6 PM.   benazepril 20 MG tablet Commonly known as: LOTENSIN Take 1 tablet (20 mg total) by mouth daily.   blood glucose meter kit and supplies Kit Dispense based on patient and insurance preference. Use four times daily as directed.  Diagnosis type 2 diabetes   feeding supplement (GLUCERNA SHAKE) Liqd Take 237 mLs by mouth 2 (two) times  daily between meals.   fish oil-omega-3 fatty acids 1000 MG capsule Take 2 g by mouth every evening.   Fluticasone-Salmeterol 100-50 MCG/DOSE Aepb Commonly known as: Advair Diskus INHALE ONE DOSE BY MOUTH EVERY 12 HOURS   hydrALAZINE 25 MG tablet Commonly known as: APRESOLINE Take 1 tablet (25 mg total) by mouth daily  as needed.   hydrochlorothiazide 25 MG tablet Commonly known as: HYDRODIURIL Take 1 tablet (25 mg total) by mouth daily.   metFORMIN 500 MG 24 hr tablet Commonly known as: GLUCOPHAGE-XR Take 1 tablet (500 mg total) by mouth 2 (two) times daily. What changed: when to take this   metoprolol tartrate 100 MG tablet Commonly known as: LOPRESSOR Take 1 tablet (100 mg total) by mouth 2 (two) times daily.   nitroGLYCERIN 0.4 MG SL tablet Commonly known as: NITROSTAT USE AS DIRECTED   pantoprazole 40 MG tablet Commonly known as: PROTONIX Take 1 tablet by mouth daily   rivaroxaban 20 MG Tabs tablet Commonly known as: Xarelto TAKE 1 TABLET BY MOUTH EVERY DAY WITH SUPPER   Vitamin D 50 MCG (2000 UT) Caps Take 2,000 Units by mouth daily.       History (reviewed): Past Medical History:  Diagnosis Date  . Arthritis    "wrists, knees, back" (12/10/2015)  . Asthma   . Cerebral atherosclerosis   . Chronic back pain    "lower primarily" (12/10/2015)  . CORONARY ATHEROSCLEROSIS NATIVE CORONARY ARTERY    Myocardial infarction at [facility] in 1996 with a directional atherectomy of the 90% LAD stenosis and medical management of 60-70% proximal circumflex and 50% RCA stenosis.  . DYSLIPIDEMIA   . GERD (gastroesophageal reflux disease)   . History of hiatal hernia   . HYPERTENSION   . ILIAC ARTERY ANEURYSM   . Myocardial infarction ([place]) 1996  . Stroke [facility]) 2003; 2013; 07/2015; 12/05/2015   denies residual on 12/10/2015, 12/13/16, 01/08/17  . TIA (transient ischemic attack)   . Type II diabetes mellitus ([place])   . UPPER RESPIRATORY INFECTION    Past Surgical History:  Procedure Laterality Date  . ANAL FISSURE REPAIR  1990s  . BASAL CELL CARCINOMA EXCISION Right 12/2015   "temple"  . CATARACT EXTRACTION W/ INTRAOCULAR LENS  IMPLANT, BILATERAL Bilateral 2000s  . CORONARY ANGIOPLASTY  1996  . KNEE CARTILAGE SURGERY Right 1970s  . LOOP RECORDER INSERTION N/A 12/15/2016   Procedure: Loop  Recorder Insertion;  Surgeon: Thompson Grayer, MD;  Location: [facility];  Service: Cardiovascular;  Laterality: N/A;  . SHOULDER ARTHROSCOPY W/ ROTATOR CUFF REPAIR Left 2000s  . TEE WITHOUT CARDIOVERSION N/A 12/15/2016   Procedure: TRANSESOPHAGEAL ECHOCARDIOGRAM (TEE);  Surgeon: Lelon Perla, MD;  [facility] END[facility]COPY;  Service: Cardiovascular;  Laterality: N/A;   Family History  Problem Relation Age of Onset  . Coronary artery disease Mother   . Cancer Father        lung  . Stroke Father   . Cancer Brother        lung  . Stroke Sister    Social History   Socioeconomic History  . Marital status: Married    Spouse name: Vanita Ingles  . Number of children: 3  . Years of education: Not on file  . Highest education level: 7th grade  Occupational History  . Occupation: Retired    Comment: [employer]  . Smoking status: Former Smoker    Packs/day: 0.12    Years: 45.00    Pack years: 5.40  Types: Cigarettes    Quit date: 11/24/1984    Years since quitting: 35.0  . Smokeless tobacco: Former Systems developer    Types: Snuff  . Tobacco comment: "dipped snuff when I was a boy"  Substance and Sexual Activity  . Alcohol use: No    Alcohol/week: 0.0 standard drinks  . Drug use: No  . Sexual activity: Not Currently  Other Topics Concern  . Not on file  Social History Narrative  . Not on file   Social Determinants of Health   Financial Resource Strain:   . Difficulty of Paying Living Expenses:   Food Insecurity:   . Worried About Charity fundraiser in the Last Year:   . Arboriculturist in the Last Year:   Transportation Needs:   . Film/video editor (Medical):   Marland Kitchen Lack of Transportation (Non-Medical):   Physical Activity:   . Days of Exercise per Week:   . Minutes of Exercise per Session:   Stress:   . Feeling of Stress :   Social Connections:   . Frequency of Communication with Friends and Family:   . Frequency of Social Gatherings with Friends  and Family:   . Attends Religious Services:   . Active Member of Clubs or Organizations:   . Attends Archivist Meetings:   Marland Kitchen Marital Status:     Activities of Daily Living In your present state of health, do you have any difficulty performing the following activities: 12/11/2019  Hearing? Y  Comment hearing is not as sharp as it was  Vision? N  Difficulty concentrating or making decisions? N  Walking or climbing stairs? Y  Comment knee pain  Dressing or bathing? N  Doing errands, shopping? Y  Comment patient does not drive anymore  Preparing Food and eating ? N  Using the Toilet? N  In the past six months, have you accidently leaked urine? N  Do you have problems with loss of bowel control? N  Managing your Medications? N  Managing your Finances? N  Housekeeping or managing your Housekeeping? N  Some recent data might be hidden    Patient Education/ Literacy How often do you need to have someone help you when you read instructions, pamphlets, or other written materials from your doctor or pharmacy?: 1 - Never What is the last grade level you completed in school?: 8th  Exercise Current Exercise Habits: Home exercise routine, Type of exercise: Other - see comments(stationary bike), Time (Minutes): 15, Frequency (Times/Week): 3, Weekly Exercise (Minutes/Week): 45, Intensity: Mild, Exercise limited by: orthopedic condition(s)  Diet Patient reports consuming 3 meals a day and 1 snack(s) a day Patient reports that his primary diet is: Diabetic Patient reports that he does have regular access to food.   Depression Screen PHQ 2/9 Scores 12/11/2019 11/23/2019 08/10/2019 05/10/2019 12/02/2018 09/08/2018 04/07/2018  PHQ - 2 Score 0 0 0 0 0 0 0  PHQ- 9 Score - - - - - - -     Fall Risk Fall Risk  12/11/2019 11/23/2019 08/10/2019 05/10/2019 12/02/2018  Falls in the past year? 1 0 1 0 0  Number falls in past yr: 0 - 0 - -  Injury with Fall? 1 - 1 - -  Comment facial injuries - - - -    Risk for fall due to : History of fall(s);Impaired mobility;Orthopedic patient - - - -  Follow up Falls evaluation completed - - - -     Objective:  Mitchell C  Radu seemed alert and oriented and he participated appropriately during our telephone visit.  Blood Pressure Weight BMI  BP Readings from Last 3 Encounters:  11/23/19 (!) 151/82  10/11/19 122/78  08/10/19 (!) 144/78   Wt Readings from Last 3 Encounters:  11/23/19 186 lb 8 oz (84.6 kg)  10/11/19 188 lb (85.3 kg)  08/10/19 186 lb (84.4 kg)   BMI Readings from Last 1 Encounters:  11/23/19 26.01 kg/m    *Unable to obtain current vital signs, weight, and BMI due to telephone visit type  Hearing/Vision  . Juandavid did not seem to have difficulty with hearing/understanding during the telephone conversation . Reports that he has had a formal eye exam by an eye care professional within the past year . Reports that he has had a formal hearing evaluation within the past year *Unable to fully assess hearing and vision during telephone visit type  Cognitive Function: 6CIT Screen 12/11/2019 12/02/2018  What Year? 0 points 0 points  What month? 0 points 3 points  What time? 0 points 0 points  Count back from 20 0 points 2 points  Months in reverse 0 points 0 points  Repeat phrase 4 points 0 points  Total Score 4 5   (Normal:0-7, Significant for Dysfunction: >8)  Normal Cognitive Function Screening: Yes   Immunization & Health Maintenance Record Immunization History  Administered Date(s) Administered  . Fluad Quad(high Dose 65+) 05/10/2019  . Influenza Inj Mdck Quad Pf 05/10/2019  . Influenza, High Dose Seasonal PF 06/10/2016, 07/20/2018  . Influenza,inj,Quad PF,6+ Mos 08/15/2013, 05/02/2014, 05/23/2015, 05/18/2017  . Pneumococcal Conjugate-13 08/15/2013  . Pneumococcal Polysaccharide-23 04/22/2015  . Td 04/16/2011  . Tdap 11/22/2010, 04/13/2011  . Zoster 09/13/2013    Health Maintenance  Topic Date Due  . COVID-19  Vaccine (1) Never done  . INFLUENZA VACCINE  03/03/2020  . FOOT EXAM  05/09/2020  . HEMOGLOBIN A1C  05/24/2020  . OPHTHALMOLOGY EXAM  07/04/2020  . TETANUS/TDAP  04/15/2021  . PNA vac Low Risk Adult  Completed       Assessment  This is a routine wellness examination for MELESIO MADARA.  Health Maintenance: Due or Overdue Health Maintenance Due  Topic Date Due  . COVID-19 Vaccine (1) Never done    [name] does not need a referral for Community Assistance: Care Management:   no Social Work:    no Prescription Assistance:  no Nutrition/Diabetes Education:  no   Plan:  Personalized Goals Goals Addressed            This Visit's Progress   . Patient Stated       12/11/2019 AWV Goal: Fall Prevention  . Over the next year, patient will decrease their risk for falls by: o Using assistive devices, such as a cane or walker, as needed o Identifying fall risks within their home and correcting them by: - Removing throw rugs - Adding handrails to stairs or ramps - Removing clutter and keeping a clear pathway throughout the home - Increasing light, especially at night - Adding shower handles/bars - Raising toilet seat o Identifying potential personal risk factors for falls: - Medication side effects - Incontinence/urgency - Vestibular dysfunction - Hearing loss - Musculoskeletal disorders - Neurological disorders - Orthostatic hypotension        Personalized Health Maintenance & Screening Recommendations  Advanced directives: has NO advanced directive - not interested in additional information  Lung Cancer Screening Recommended: no (Low Dose CT Chest recommended if Age 61-80 years,  30 pack-year currently smoking OR have quit w/in past 15 years) Hepatitis C Screening recommended: no HIV Screening recommended: no  Advanced Directives: Written information was not prepared per patient's request.  Referrals & Orders No orders of the defined types were placed in  this encounter.   Follow-up Plan . Follow-up with Dettinger, Fransisca Kaufmann, MD as planned  Patient declined after visit summary.   I have personally reviewed and noted the following in the patient's chart:   . Medical and social history . Use of alcohol, tobacco or illicit drugs  . Current medications and supplements . Functional ability and status . Nutritional status . Physical activity . Advanced directives . List of other physicians . Hospitalizations, surgeries, and ER visits in previous 12 months . Vitals . Screenings to include cognitive, depression, and falls . Referrals and appointments  In addition, I have reviewed and discussed with [name] certain preventive protocols, quality metrics, and best practice recommendations. A written personalized care plan for preventive services as well as general preventive health recommendations is available and can be mailed to the patient at his request.      Felicity Coyer, LPN    [id number]

## 2019-12-14 LAB — CUP PACEART REMOTE DEVICE CHECK
Date Time Interrogation Session: 20210512031712
Implantable Pulse Generator Implant Date: 20180515

## 2019-12-15 NOTE — Telephone Encounter (Signed)
Carelink monitor up to date as of 12/15/19. Encounter closed.

## 2019-12-18 ENCOUNTER — Ambulatory Visit (INDEPENDENT_AMBULATORY_CARE_PROVIDER_SITE_OTHER): Payer: Medicare Other | Admitting: *Deleted

## 2019-12-18 DIAGNOSIS — I480 Paroxysmal atrial fibrillation: Secondary | ICD-10-CM

## 2019-12-19 NOTE — Progress Notes (Signed)
Carelink Summary Report / Loop Recorder 

## 2020-01-11 ENCOUNTER — Other Ambulatory Visit: Payer: Self-pay | Admitting: Family Medicine

## 2020-01-13 ENCOUNTER — Other Ambulatory Visit: Payer: Self-pay | Admitting: Family Medicine

## 2020-01-13 DIAGNOSIS — E785 Hyperlipidemia, unspecified: Secondary | ICD-10-CM

## 2020-01-22 ENCOUNTER — Ambulatory Visit (INDEPENDENT_AMBULATORY_CARE_PROVIDER_SITE_OTHER): Payer: Medicare Other | Admitting: *Deleted

## 2020-01-22 DIAGNOSIS — Z8673 Personal history of transient ischemic attack (TIA), and cerebral infarction without residual deficits: Secondary | ICD-10-CM | POA: Diagnosis not present

## 2020-01-23 LAB — CUP PACEART REMOTE DEVICE CHECK
Date Time Interrogation Session: 20210621005424
Implantable Pulse Generator Implant Date: 20180515

## 2020-01-23 NOTE — Progress Notes (Signed)
Carelink Summary Report / Loop Recorder 

## 2020-02-06 DIAGNOSIS — M79676 Pain in unspecified toe(s): Secondary | ICD-10-CM | POA: Diagnosis not present

## 2020-02-06 DIAGNOSIS — B351 Tinea unguium: Secondary | ICD-10-CM | POA: Diagnosis not present

## 2020-02-06 DIAGNOSIS — E1142 Type 2 diabetes mellitus with diabetic polyneuropathy: Secondary | ICD-10-CM | POA: Diagnosis not present

## 2020-02-06 DIAGNOSIS — L84 Corns and callosities: Secondary | ICD-10-CM | POA: Diagnosis not present

## 2020-02-08 ENCOUNTER — Other Ambulatory Visit: Payer: Self-pay | Admitting: Family Medicine

## 2020-02-08 DIAGNOSIS — I152 Hypertension secondary to endocrine disorders: Secondary | ICD-10-CM

## 2020-02-09 ENCOUNTER — Other Ambulatory Visit: Payer: Self-pay | Admitting: Family Medicine

## 2020-02-09 DIAGNOSIS — E1159 Type 2 diabetes mellitus with other circulatory complications: Secondary | ICD-10-CM

## 2020-02-18 ENCOUNTER — Emergency Department (HOSPITAL_COMMUNITY): Payer: Medicare Other

## 2020-02-18 ENCOUNTER — Encounter (HOSPITAL_COMMUNITY): Payer: Self-pay | Admitting: Pharmacy Technician

## 2020-02-18 ENCOUNTER — Emergency Department (HOSPITAL_COMMUNITY)
Admission: EM | Admit: 2020-02-18 | Discharge: 2020-02-18 | Disposition: A | Discharge status: Home / Self Care | Payer: Medicare Other | Attending: Emergency Medicine | Admitting: Emergency Medicine

## 2020-02-18 DIAGNOSIS — Z7984 Long term (current) use of oral hypoglycemic drugs: Secondary | ICD-10-CM | POA: Insufficient documentation

## 2020-02-18 DIAGNOSIS — I63233 Cerebral infarction due to unspecified occlusion or stenosis of bilateral carotid arteries: Secondary | ICD-10-CM | POA: Diagnosis not present

## 2020-02-18 DIAGNOSIS — I252 Old myocardial infarction: Secondary | ICD-10-CM | POA: Diagnosis not present

## 2020-02-18 DIAGNOSIS — Z955 Presence of coronary angioplasty implant and graft: Secondary | ICD-10-CM | POA: Diagnosis not present

## 2020-02-18 DIAGNOSIS — Z79899 Other long term (current) drug therapy: Secondary | ICD-10-CM | POA: Insufficient documentation

## 2020-02-18 DIAGNOSIS — Z87891 Personal history of nicotine dependence: Secondary | ICD-10-CM | POA: Diagnosis not present

## 2020-02-18 DIAGNOSIS — R479 Unspecified speech disturbances: Secondary | ICD-10-CM | POA: Diagnosis present

## 2020-02-18 DIAGNOSIS — R200 Anesthesia of skin: Secondary | ICD-10-CM | POA: Insufficient documentation

## 2020-02-18 DIAGNOSIS — G459 Transient cerebral ischemic attack, unspecified: Secondary | ICD-10-CM

## 2020-02-18 DIAGNOSIS — Z049 Encounter for examination and observation for unspecified reason: Secondary | ICD-10-CM | POA: Diagnosis not present

## 2020-02-18 DIAGNOSIS — I10 Essential (primary) hypertension: Secondary | ICD-10-CM | POA: Diagnosis not present

## 2020-02-18 DIAGNOSIS — R29810 Facial weakness: Secondary | ICD-10-CM | POA: Insufficient documentation

## 2020-02-18 DIAGNOSIS — Z8673 Personal history of transient ischemic attack (TIA), and cerebral infarction without residual deficits: Secondary | ICD-10-CM | POA: Insufficient documentation

## 2020-02-18 DIAGNOSIS — Z7901 Long term (current) use of anticoagulants: Secondary | ICD-10-CM | POA: Insufficient documentation

## 2020-02-18 DIAGNOSIS — I2510 Atherosclerotic heart disease of native coronary artery without angina pectoris: Secondary | ICD-10-CM | POA: Insufficient documentation

## 2020-02-18 DIAGNOSIS — R4701 Aphasia: Secondary | ICD-10-CM | POA: Diagnosis not present

## 2020-02-18 DIAGNOSIS — I6503 Occlusion and stenosis of bilateral vertebral arteries: Secondary | ICD-10-CM | POA: Diagnosis not present

## 2020-02-18 LAB — I-STAT CHEM 8, ED
BUN: 17 mg/dL (ref 8–23)
Calcium, Ion: 1.09 mmol/L — ABNORMAL LOW (ref 1.15–1.40)
Chloride: 95 mmol/L — ABNORMAL LOW (ref 98–111)
Creatinine, Ser: 1.2 mg/dL (ref 0.61–1.24)
Glucose, Bld: 161 mg/dL — ABNORMAL HIGH (ref 70–99)
HCT: 39 % (ref 39.0–52.0)
Hemoglobin: 13.3 g/dL (ref 13.0–17.0)
Potassium: 3.5 mmol/L (ref 3.5–5.1)
Sodium: 135 mmol/L (ref 135–145)
TCO2: 23 mmol/L (ref 22–32)

## 2020-02-18 LAB — COMPREHENSIVE METABOLIC PANEL
ALT: 23 U/L (ref 0–44)
AST: 30 U/L (ref 15–41)
Albumin: 3.8 g/dL (ref 3.5–5.0)
Alkaline Phosphatase: 68 U/L (ref 38–126)
Anion gap: 12 (ref 5–15)
BUN: 14 mg/dL (ref 8–23)
CO2: 23 mmol/L (ref 22–32)
Calcium: 8.8 mg/dL — ABNORMAL LOW (ref 8.9–10.3)
Chloride: 96 mmol/L — ABNORMAL LOW (ref 98–111)
Creatinine, Ser: 1.2 mg/dL (ref 0.61–1.24)
GFR calc Af Amer: >60 mL/min (ref >60)
GFR calc non Af Amer: 54 mL/min — ABNORMAL LOW (ref >60)
Glucose, Bld: 168 mg/dL — ABNORMAL HIGH (ref 70–99)
Potassium: 3.5 mmol/L (ref 3.5–5.1)
Sodium: 131 mmol/L — ABNORMAL LOW (ref 135–145)
Total Bilirubin: 1.3 mg/dL — ABNORMAL HIGH (ref 0.3–1.2)
Total Protein: 6.9 g/dL (ref 6.5–8.1)

## 2020-02-18 LAB — DIFFERENTIAL
Abs Immature Granulocytes: 0.02 10*3/uL (ref 0.00–0.07)
Basophils Absolute: 0 10*3/uL (ref 0.0–0.1)
Basophils Relative: 0 %
Eosinophils Absolute: 0.1 10*3/uL (ref 0.0–0.5)
Eosinophils Relative: 1 %
Immature Granulocytes: 0 %
Lymphocytes Relative: 20 %
Lymphs Abs: 1.2 10*3/uL (ref 0.7–4.0)
Monocytes Absolute: 0.5 10*3/uL (ref 0.1–1.0)
Monocytes Relative: 7 %
Neutro Abs: 4.5 10*3/uL (ref 1.7–7.7)
Neutrophils Relative %: 72 %

## 2020-02-18 LAB — CBC
HCT: 30.6 % — ABNORMAL LOW (ref 39.0–52.0)
Hemoglobin: 10.7 g/dL — ABNORMAL LOW (ref 13.0–17.0)
MCH: 33.9 pg (ref 26.0–34.0)
MCHC: 35 g/dL (ref 30.0–36.0)
MCV: 96.8 fL (ref 80.0–100.0)
Platelets: 142 10*3/uL — ABNORMAL LOW (ref 150–400)
RBC: 3.16 MIL/uL — ABNORMAL LOW (ref 4.22–5.81)
RDW: 12.2 % (ref 11.5–15.5)
WBC: 6.3 10*3/uL (ref 4.0–10.5)
nRBC: 0 % (ref 0.0–0.2)

## 2020-02-18 LAB — URINALYSIS, ROUTINE W REFLEX MICROSCOPIC
Bilirubin Urine: NEGATIVE
Glucose, UA: NEGATIVE mg/dL
Ketones, ur: NEGATIVE mg/dL
Leukocytes,Ua: NEGATIVE
Nitrite: NEGATIVE
Protein, ur: NEGATIVE mg/dL
Specific Gravity, Urine: 1.015 (ref 1.005–1.030)
pH: 6 (ref 5.0–8.0)

## 2020-02-18 LAB — PROTIME-INR
INR: 1.3 — ABNORMAL HIGH (ref 0.8–1.2)
Prothrombin Time: 15.3 seconds — ABNORMAL HIGH (ref 11.4–15.2)

## 2020-02-18 LAB — URINALYSIS, MICROSCOPIC (REFLEX): Bacteria, UA: NONE SEEN

## 2020-02-18 LAB — RAPID URINE DRUG SCREEN, HOSP PERFORMED
Amphetamines: NOT DETECTED
Barbiturates: NOT DETECTED
Benzodiazepines: NOT DETECTED
Cocaine: NOT DETECTED
Opiates: NOT DETECTED
Tetrahydrocannabinol: NOT DETECTED

## 2020-02-18 LAB — ETHANOL: Alcohol, Ethyl (B): <10 mg/dL (ref <10)

## 2020-02-18 LAB — APTT: aPTT: 32 seconds (ref 24–36)

## 2020-02-18 MED ORDER — LORAZEPAM 2 MG/ML IJ SOLN
1.0000 mg | Freq: Once | INTRAMUSCULAR | Status: AC | PRN
  Administered 2020-02-18: 1 mg via INTRAVENOUS
  Filled 2020-02-18: qty 1

## 2020-02-18 MED ORDER — IOHEXOL 350 MG/ML SOLN
75.0000 mL | Freq: Once | INTRAVENOUS | Status: AC | PRN
  Administered 2020-02-18: 75 mL via INTRAVENOUS

## 2020-02-18 NOTE — ED Notes (Signed)
Patient verbalizes understanding of discharge instructions. Opportunity for questioning and answers were provided. Armband removed by staff, pt discharged from ED via wheelchair and left to go home with family.

## 2020-02-18 NOTE — ED Provider Notes (Signed)
[facility] EMERGENCY DEPARTMENT Provider Note   CSN: [id number] Arrival date & time: 02/18/20  1609     History Chief Complaint  Patient presents with  . Transient Ischemic Attack    [name] is a 86 y.o. male.  HPI     86yo male with history of hypertension, hyperlipidemia, DM, GERD, CVA, atrial fibrillation on xarelto, PFO, CAD, presents with concern for episode of difficulty speaking with right sided facial droop and hand numbness.  Difficulty talking, Right hand numbness and right side of face drooping, similar to prior TIA. Felt like he couldn't get words out that he wanted to say.   Lasted several minutes beginning at 130PM Has not had episode in last 2 yr since he began coumadin   Denies numbness, weakness, difficulty talking or walking, visual changes or facial droop at this time    Past Medical History:  Diagnosis Date  . Arthritis    "wrists, knees, back" (12/10/2015)  . Asthma   . Cerebral atherosclerosis   . Chronic back pain    "lower primarily" (12/10/2015)  . CORONARY ATHEROSCLEROSIS NATIVE CORONARY ARTERY    Myocardial infarction at [facility] in 1996 with a directional atherectomy of the 90% LAD stenosis and medical management of 60-70% proximal circumflex and 50% RCA stenosis.  . DYSLIPIDEMIA   . GERD (gastroesophageal reflux disease)   . History of hiatal hernia   . HYPERTENSION   . ILIAC ARTERY ANEURYSM   . Myocardial infarction ([place]) 1996  . Stroke [facility]) 2003; 2013; 07/2015; 12/05/2015   denies residual on 12/10/2015, 12/13/16, 01/08/17  . TIA (transient ischemic attack)   . Type II diabetes mellitus ([place])   . UPPER RESPIRATORY INFECTION     Patient Active Problem List   Diagnosis Date Noted  . Educated about COVID-19 virus infection 10/09/2019  . Paroxysmal atrial fibrillation ([place]) 01/19/2018  . History of CVA (cerebrovascular accident) 04/21/2017  . PFO (patent foramen ovale) 12/30/2016  . GERD (gastroesophageal reflux  disease) 02/25/2016  . Malnutrition of moderate degree 12/11/2015  . DM type 2 (diabetes mellitus, type 2) ([place]) 12/06/2015  . Dysarthria 12/05/2015  . Metabolic syndrome 30/86/5784  . Vitamin D deficiency 08/01/2013  . PVD (peripheral vascular disease) ([place]) 11/25/2011  . ILIAC ARTERY ANEURYSM 07/17/2010  . Hyperlipidemia associated with type 2 diabetes mellitus ([place]) 08/28/2009  . CORONARY ATHEROSCLEROSIS NATIVE CORONARY ARTERY 08/28/2009  . Hypertension associated with diabetes ([place]) 06/05/2009  . Cerebral atherosclerosis 06/04/2009    Past Surgical History:  Procedure Laterality Date  . ANAL FISSURE REPAIR  1990s  . BASAL CELL CARCINOMA EXCISION Right 12/2015   "temple"  . CATARACT EXTRACTION W/ INTRAOCULAR LENS  IMPLANT, BILATERAL Bilateral 2000s  . CORONARY ANGIOPLASTY  1996  . KNEE CARTILAGE SURGERY Right 1970s  . LOOP RECORDER INSERTION N/A 12/15/2016   Procedure: Loop Recorder Insertion;  Surgeon: Thompson Grayer, MD;  Location: [facility];  Service: Cardiovascular;  Laterality: N/A;  . SHOULDER ARTHROSCOPY W/ ROTATOR CUFF REPAIR Left 2000s  . TEE WITHOUT CARDIOVERSION N/A 12/15/2016   Procedure: TRANSESOPHAGEAL ECHOCARDIOGRAM (TEE);  Surgeon: Lelon Perla, MD;  Location: [facility] ENDOSCOPY;  Service: Cardiovascular;  Laterality: N/A;       Family History  Problem Relation Age of Onset  . Coronary artery disease Mother   . Cancer Father        lung  . Stroke Father   . Cancer Brother        lung  . Stroke  Sister     Social History   Tobacco Use  . Smoking status: Former Smoker    Packs/day: 0.12    Years: 45.00    Pack years: 5.40    Types: Cigarettes    Quit date: 11/24/1984    Years since quitting: 35.2  . Smokeless tobacco: Former Systems developer    Types: Snuff  . Tobacco comment: "dipped snuff when I was a boy"  Vaping Use  . Vaping Use: Never used  Substance Use Topics  . Alcohol use: No    Alcohol/week: 0.0 standard drinks  . Drug use: No    Home  Medications Prior to Admission medications   Medication Sig Start Date End Date Taking? Authorizing Provider  acetaminophen (TYLENOL) 325 MG tablet Take 650 mg by mouth every 6 (six) hours as needed for mild pain, fever or headache.   Yes [provider]  albuterol (VENTOLIN HFA) 108 (90 Base) MCG/ACT inhaler INHALE 1 TO 2 PUFFS BY MOUTH EVERY 6 HOURS AS NEEDED FOR WHEEZING FOR SHORTNESS OF BREATH Patient taking differently: Inhale 1-2 puffs into the lungs every 6 (six) hours as needed for wheezing or shortness of breath.  01/11/20  Yes Dettinger, Fransisca Kaufmann, MD  atorvastatin (LIPITOR) 80 MG tablet TAKE 1 TABLET BY MOUTH ONCE DAILY AT  6PM Patient taking differently: Take 80 mg by mouth daily.  01/15/20  Yes Dettinger, Fransisca Kaufmann, MD  benazepril (LOTENSIN) 20 MG tablet Take 1 tablet by mouth once daily Patient taking differently: Take 20 mg by mouth daily.  12/11/19  Yes Dettinger, Fransisca Kaufmann, MD  Cholecalciferol (VITAMIN D) 2000 units CAPS Take 2,000 Units by mouth daily.   Yes [provider]  feeding supplement, GLUCERNA SHAKE, (GLUCERNA SHAKE) LIQD Take 237 mLs by mouth 2 (two) times daily between meals. 12/13/15  Yes Timmothy Euler, MD  fish oil-omega-3 fatty acids 1000 MG capsule Take 2 g by mouth every evening.    Yes [provider]  Fluticasone-Salmeterol (ADVAIR DISKUS) 100-50 MCG/DOSE AEPB INHALE ONE DOSE BY MOUTH EVERY 12 HOURS Patient taking differently: Inhale 1 puff into the lungs every 12 (twelve) hours as needed (wheezing, shortness of breath). INHALE ONE DOSE BY MOUTH EVERY 12 HOURS 03/06/19  Yes Hawks, Christy A, FNP  hydrALAZINE (APRESOLINE) 25 MG tablet Take 1 tablet (25 mg total) by mouth daily as needed. Patient taking differently: Take 25 mg by mouth daily as needed (high bp).  05/10/19  Yes Dettinger, Fransisca Kaufmann, MD  hydrochlorothiazide (HYDRODIURIL) 25 MG tablet Take 1 tablet by mouth once daily Patient taking differently: Take 12.5-25 mg by mouth daily.   02/09/20  Yes Dettinger, Fransisca Kaufmann, MD  metFORMIN (GLUCOPHAGE-XR) 500 MG 24 hr tablet Take 1 tablet (500 mg total) by mouth 2 (two) times daily. Patient taking differently: Take 500 mg by mouth in the morning and at bedtime.  02/01/19  Yes Dettinger, Fransisca Kaufmann, MD  metoprolol tartrate (LOPRESSOR) 100 MG tablet Take 1 tablet by mouth twice daily Patient taking differently: Take 100 mg by mouth 2 (two) times daily.  02/09/20  Yes Dettinger, Fransisca Kaufmann, MD  nitroGLYCERIN (NITROSTAT) 0.4 MG SL tablet USE AS DIRECTED Patient taking differently: Place 0.4 mg under the tongue every 5 (five) minutes as needed for chest pain. USE AS DIRECTED 12/11/19  Yes Dettinger, Fransisca Kaufmann, MD  pantoprazole (PROTONIX) 40 MG tablet Take 1 tablet by mouth once daily Patient taking differently: Take 40 mg by mouth daily.  02/09/20  Yes Dettinger, Fransisca Kaufmann,  MD  rivaroxaban (XARELTO) 20 MG TABS tablet TAKE 1 TABLET BY MOUTH EVERY DAY WITH SUPPER Patient taking differently: Take 20 mg by mouth daily with supper. TAKE 1 TABLET BY MOUTH EVERY DAY WITH SUPPER 08/10/19  Yes Dettinger, Fransisca Kaufmann, MD  Accu-Chek FastClix Lancets MISC CHECK GLUCOSE 4 TIMES DAILY Dx E11.9 09/29/19   Dettinger, Fransisca Kaufmann, MD  blood glucose meter kit and supplies KIT Dispense based on patient and insurance preference. Use four times daily as directed.  Diagnosis type 2 diabetes 05/10/19   Dettinger, Fransisca Kaufmann, MD  glucose blood (ACCU-CHEK GUIDE) test strip Test BS QID Dx E11.9 07/07/19   Dettinger, Fransisca Kaufmann, MD    Allergies    Alteplase, Codeine, Lopid [gemfibrozil], and Voltaren [diclofenac sodium]  Review of Systems   Review of Systems  Constitutional: Negative for fever.  HENT: Negative for sore throat.   Eyes: Negative for visual disturbance.  Respiratory: Negative for cough and shortness of breath.   Cardiovascular: Negative for chest pain.  Gastrointestinal: Negative for abdominal pain, nausea and vomiting.  Genitourinary: Negative for difficulty urinating.    Musculoskeletal: Negative for back pain.  Skin: Negative for rash.  Neurological: Positive for facial asymmetry and numbness. Negative for dizziness, syncope, speech difficulty, weakness and headaches.    Physical Exam Updated Vital Signs BP (!) 163/85 (BP Location: Right Arm)   Pulse 82   Temp 97.8 F (36.6 C) (Oral)   Resp 18   SpO2 98%   Physical Exam Constitutional:      General: He is not in acute distress.    Appearance: Normal appearance. He is not ill-appearing.  HENT:     Head: Normocephalic and atraumatic.  Eyes:     General: No visual field deficit.    Extraocular Movements: Extraocular movements intact.     Conjunctiva/sclera: Conjunctivae normal.     Pupils: Pupils are equal, round, and reactive to light.  Cardiovascular:     Rate and Rhythm: Normal rate and regular rhythm.     Pulses: Normal pulses.  Pulmonary:     Effort: Pulmonary effort is normal. No respiratory distress.  Musculoskeletal:        General: No swelling or tenderness.     Cervical back: Normal range of motion.  Skin:    General: Skin is warm and dry.     Findings: No erythema or rash.  Neurological:     General: No focal deficit present.     Mental Status: He is alert and oriented to person, place, and time.     GCS: GCS eye subscore is 4. GCS verbal subscore is 5. GCS motor subscore is 6.     Cranial Nerves: No cranial nerve deficit, dysarthria or facial asymmetry.     Sensory: No sensory deficit.     Motor: No weakness or tremor.     Coordination: Coordination normal. Finger-Nose-Finger Test normal.     Gait: Gait normal.     ED Results / Procedures / Treatments   Labs (all labs ordered are listed, but only abnormal results are displayed) Labs Reviewed  PROTIME-INR - Abnormal; Notable for the following components:      Result Value   Prothrombin Time 15.3 (*)    INR 1.3 (*)    All other components within normal limits  CBC - Abnormal; Notable for the following components:    RBC 3.16 (*)    Hemoglobin 10.7 (*)    HCT 30.6 (*)    Platelets 142 (*)  All other components within normal limits  COMPREHENSIVE METABOLIC PANEL - Abnormal; Notable for the following components:   Sodium 131 (*)    Chloride 96 (*)    Glucose, Bld 168 (*)    Calcium 8.8 (*)    Total Bilirubin 1.3 (*)    GFR calc non Af Amer 54 (*)    All other components within normal limits  URINALYSIS, ROUTINE W REFLEX MICROSCOPIC - Abnormal; Notable for the following components:   Hgb urine dipstick TRACE (*)    All other components within normal limits  URINALYSIS, MICROSCOPIC (REFLEX) - Abnormal; Notable for the following components:   Non Squamous Epithelial PRESENT (*)    All other components within normal limits  I-STAT CHEM 8, ED - Abnormal; Notable for the following components:   Chloride 95 (*)    Glucose, Bld 161 (*)    Calcium, Ion 1.09 (*)    All other components within normal limits  ETHANOL  APTT  DIFFERENTIAL  RAPID URINE DRUG SCREEN, HOSP PERFORMED    EKG EKG Interpretation  Date/Time:  Sunday February 18 2020 16:14:06 EDT Ventricular Rate:  77 PR Interval:    QRS Duration: 92 QT Interval:  368 QTC Calculation: 417 R Axis:   -34 Text Interpretation: Sinus rhythm Left axis deviation Abnormal R-wave progression, late transition No significant change since last tracing Confirmed by Gareth Morgan [phone]) on 02/18/2020 6:45:57 PM   Radiology CT Angio Head W or Wo Contrast  Result Date: 02/18/2020 CLINICAL DATA:  Right arm numbness and tingling EXAM: CT ANGIOGRAPHY HEAD AND NECK TECHNIQUE: Multidetector CT imaging of the head and neck was performed using the standard protocol during bolus administration of intravenous contrast. Multiplanar CT image reconstructions and MIPs were obtained to evaluate the vascular anatomy. Carotid stenosis measurements (when applicable) are obtained utilizing NASCET criteria, using the distal internal carotid diameter as the denominator.  CONTRAST:  16m OMNIPAQUE IOHEXOL 350 MG/ML SOLN COMPARISON:  CT head 2020, CTA 2018 FINDINGS: CT HEAD Brain: There is no acute intracranial hemorrhage, mass effect, or edema. Gray-white differentiation is preserved. There is no extra-axial fluid collection. Prominence of the ventricles and sulci reflects stable generalized parenchymal volume loss. Patchy and confluent areas of hypoattenuation in the supratentorial white matter are nonspecific but probably reflect stable chronic microvascular ischemic changes. Vascular: There is atherosclerotic calcification at the skull base. Skull: Calvarium is unremarkable. Sinuses/Orbits: No acute finding. Other: None. Review of the MIP images confirms the above findings CTA NECK Aortic arch: Calcified and noncalcified plaque along the aortic arch and patent great vessel origins. Right carotid system: Patent. Calcified plaque is present at the ICA origin with less than 50% stenosis. Appearance is similar to the prior study. Left carotid system: Patent. Calcified plaque is present at the ICA origin with minimal stenosis. Appearance is similar to the prior study. Vertebral arteries: Extracranial vertebral arteries are patent and codominant. There is plaque at the origins without significant stenosis. Skeleton: Advanced degenerative changes of the cervical spine. Other neck: No new or acute abnormality. Upper chest: No new or acute abnormality. Review of the MIP images confirms the above findings CTA HEAD Anterior circulation: Intracranial internal carotid arteries are patent with calcified plaque causing mild stenosis. A broad-based outpouching along the posterior aspect of the right carotid terminus measuring approximately 2.5 x 1 mm (series 10, image 275) is unchanged. Anterior cerebral arteries are patent. Middle cerebral arteries are patent. Posterior circulation: Intracranial vertebral arteries are patent. There is irregular plaque along the distal  right vertebral artery  causing moderate stenosis. Basilar artery is patent with mild atherosclerotic irregularity. Posterior cerebral arteries are patent. There is fetal origin of the right posterior cerebral artery. There is atherosclerotic irregularity of both PCAs with mild to moderate stenosis on the left. Venous sinuses: Patent as allowed by contrast bolus timing. Review of the MIP images confirms the above findings IMPRESSION: No acute intracranial abnormality. Similar appearance of calcified plaque at the ICA origins without hemodynamically significant stenosis. Similar appearance of intracranial atherosclerosis. Small broad-based outpouching from the right carotid terminus is unchanged. This may reflect atherosclerotic irregularity or possibly a small aneurysm. Electronically Signed   By: Macy Mis M.D.   On: 02/18/2020 20:22   CT Angio Neck W and/or Wo Contrast  Result Date: 02/18/2020 CLINICAL DATA:  Right arm numbness and tingling EXAM: CT ANGIOGRAPHY HEAD AND NECK TECHNIQUE: Multidetector CT imaging of the head and neck was performed using the standard protocol during bolus administration of intravenous contrast. Multiplanar CT image reconstructions and MIPs were obtained to evaluate the vascular anatomy. Carotid stenosis measurements (when applicable) are obtained utilizing NASCET criteria, using the distal internal carotid diameter as the denominator. CONTRAST:  25m OMNIPAQUE IOHEXOL 350 MG/ML SOLN COMPARISON:  CT head 2020, CTA 2018 FINDINGS: CT HEAD Brain: There is no acute intracranial hemorrhage, mass effect, or edema. Gray-white differentiation is preserved. There is no extra-axial fluid collection. Prominence of the ventricles and sulci reflects stable generalized parenchymal volume loss. Patchy and confluent areas of hypoattenuation in the supratentorial white matter are nonspecific but probably reflect stable chronic microvascular ischemic changes. Vascular: There is atherosclerotic calcification at the  skull base. Skull: Calvarium is unremarkable. Sinuses/Orbits: No acute finding. Other: None. Review of the MIP images confirms the above findings CTA NECK Aortic arch: Calcified and noncalcified plaque along the aortic arch and patent great vessel origins. Right carotid system: Patent. Calcified plaque is present at the ICA origin with less than 50% stenosis. Appearance is similar to the prior study. Left carotid system: Patent. Calcified plaque is present at the ICA origin with minimal stenosis. Appearance is similar to the prior study. Vertebral arteries: Extracranial vertebral arteries are patent and codominant. There is plaque at the origins without significant stenosis. Skeleton: Advanced degenerative changes of the cervical spine. Other neck: No new or acute abnormality. Upper chest: No new or acute abnormality. Review of the MIP images confirms the above findings CTA HEAD Anterior circulation: Intracranial internal carotid arteries are patent with calcified plaque causing mild stenosis. A broad-based outpouching along the posterior aspect of the right carotid terminus measuring approximately 2.5 x 1 mm (series 10, image 275) is unchanged. Anterior cerebral arteries are patent. Middle cerebral arteries are patent. Posterior circulation: Intracranial vertebral arteries are patent. There is irregular plaque along the distal right vertebral artery causing moderate stenosis. Basilar artery is patent with mild atherosclerotic irregularity. Posterior cerebral arteries are patent. There is fetal origin of the right posterior cerebral artery. There is atherosclerotic irregularity of both PCAs with mild to moderate stenosis on the left. Venous sinuses: Patent as allowed by contrast bolus timing. Review of the MIP images confirms the above findings IMPRESSION: No acute intracranial abnormality. Similar appearance of calcified plaque at the ICA origins without hemodynamically significant stenosis. Similar appearance of  intracranial atherosclerosis. Small broad-based outpouching from the right carotid terminus is unchanged. This may reflect atherosclerotic irregularity or possibly a small aneurysm. Electronically Signed   By: PMacy MisM.D.   On: 02/18/2020 20:22   MR  BRAIN WO CONTRAST  Result Date: 02/18/2020 CLINICAL DATA:  Expressive aphasia, facial and hand numbness, resolved EXAM: MRI HEAD WITHOUT CONTRAST TECHNIQUE: Multiplanar, multiecho pulse sequences of the brain and surrounding structures were obtained without intravenous contrast. COMPARISON:  2018 FINDINGS: Brain: There is no acute infarction or intracranial hemorrhage. There is no intracranial mass, mass effect, or edema. There is no hydrocephalus or extra-axial fluid collection. Prominence of the ventricles and sulci reflects similar generalized parenchymal volume loss. Patchy and confluent areas of T2 hyperintensity in the supratentorial and pontine white matter are nonspecific but probably reflect moderate chronic microvascular ischemic changes. Vascular: Major vessel flow voids at the skull base are preserved. Skull and upper cervical spine: Normal marrow signal is preserved. Sinuses/Orbits: Minor mucosal thickening. Bilateral lens replacements. Other: Sella is unremarkable.  Mastoid air cells are clear. IMPRESSION: No evidence of acute infarction, hemorrhage, or mass. Moderate chronic microvascular ischemic changes. Electronically Signed   By: Macy Mis M.D.   On: 02/18/2020 18:38    Procedures Procedures (including critical care time)  Medications Ordered in ED Medications  LORazepam (ATIVAN) injection 1 mg (1 mg Intravenous Given 02/18/20 1737)  iohexol (OMNIPAQUE) 350 MG/ML injection 75 mL (75 mLs Intravenous Contrast Given 02/18/20 1925)    ED Course  I have reviewed the triage vital signs and the nursing notes.  Pertinent labs & imaging results that were available during my care of the patient were reviewed by me and considered in  my medical decision making (see chart for details).    MDM Rules/Calculators/A&P                          86yo male with history of hypertension, hyperlipidemia, DM, GERD, CVA, atrial fibrillation on xarelto, PFO, CAD, presents with concern for episode of difficulty speaking with right sided facial droop and hand numbness.  DDx includes electrolyte abnormalities, CVA, TIA, seizure.   Labs without significant electrolyte abnormalities. No headache or trauma or current symptoms and do not feel CT head indicated at this time.    Discussed with Neurology Dr. Rory Percy and will obtain MR brain.   MR obtained without sign of CVA. CTA obtained showing no acute changes, similar appearance of ICA origin plaque/intracranial atherosclerosis/small outpouching right carotid terminus compared to prior images.  Discussed conservative option to come into hospital admission for TIA however given he is on xarelto without changes on CTA it not clear it will change course of care and that follow up with Dr. Leonie Man as an outpatient is also appropriate.    Final Clinical Impression(s) / ED Diagnoses Final diagnoses:  TIA (transient ischemic attack)    Rx / DC Orders ED Discharge Orders    None       Gareth Morgan, MD 02/19/20 0020

## 2020-02-18 NOTE — ED Triage Notes (Signed)
Pt bib ems with reports of facial and hand numbness at approx 1400 with expressive aphasia. Pt symptoms resolved prior to ems arrival on scene. Hx TIA, CVA and MI. Stroke scale negative with EMS. CBG 166 HR 78 RR18 BP 150/74 94% RA

## 2020-02-26 ENCOUNTER — Ambulatory Visit (INDEPENDENT_AMBULATORY_CARE_PROVIDER_SITE_OTHER): Payer: Medicare Other | Admitting: *Deleted

## 2020-02-26 DIAGNOSIS — I480 Paroxysmal atrial fibrillation: Secondary | ICD-10-CM | POA: Diagnosis not present

## 2020-02-27 DIAGNOSIS — J343 Hypertrophy of nasal turbinates: Secondary | ICD-10-CM | POA: Diagnosis not present

## 2020-02-27 DIAGNOSIS — J328 Other chronic sinusitis: Secondary | ICD-10-CM | POA: Diagnosis not present

## 2020-02-27 DIAGNOSIS — J342 Deviated nasal septum: Secondary | ICD-10-CM | POA: Diagnosis not present

## 2020-02-27 DIAGNOSIS — Z87891 Personal history of nicotine dependence: Secondary | ICD-10-CM | POA: Diagnosis not present

## 2020-02-27 DIAGNOSIS — H6123 Impacted cerumen, bilateral: Secondary | ICD-10-CM | POA: Diagnosis not present

## 2020-02-27 DIAGNOSIS — J329 Chronic sinusitis, unspecified: Secondary | ICD-10-CM | POA: Diagnosis not present

## 2020-02-27 LAB — CUP PACEART REMOTE DEVICE CHECK
Date Time Interrogation Session: 20210725231634
Implantable Pulse Generator Implant Date: 20180515

## 2020-02-29 NOTE — Progress Notes (Signed)
Carelink Summary Report / Loop Recorder 

## 2020-03-06 ENCOUNTER — Other Ambulatory Visit: Payer: Self-pay

## 2020-03-06 ENCOUNTER — Ambulatory Visit (INDEPENDENT_AMBULATORY_CARE_PROVIDER_SITE_OTHER): Payer: Medicare Other | Admitting: Family Medicine

## 2020-03-06 ENCOUNTER — Encounter: Payer: Self-pay | Admitting: Family Medicine

## 2020-03-06 VITALS — BP 157/82 | HR 64 | Temp 97.3°F | Ht 71.0 in | Wt 185.0 lb

## 2020-03-06 DIAGNOSIS — I10 Essential (primary) hypertension: Secondary | ICD-10-CM

## 2020-03-06 DIAGNOSIS — Z8673 Personal history of transient ischemic attack (TIA), and cerebral infarction without residual deficits: Secondary | ICD-10-CM | POA: Diagnosis not present

## 2020-03-06 DIAGNOSIS — I480 Paroxysmal atrial fibrillation: Secondary | ICD-10-CM | POA: Diagnosis not present

## 2020-03-06 DIAGNOSIS — E1159 Type 2 diabetes mellitus with other circulatory complications: Secondary | ICD-10-CM

## 2020-03-06 DIAGNOSIS — E1169 Type 2 diabetes mellitus with other specified complication: Secondary | ICD-10-CM | POA: Diagnosis not present

## 2020-03-06 DIAGNOSIS — E785 Hyperlipidemia, unspecified: Secondary | ICD-10-CM

## 2020-03-06 LAB — BAYER DCA HB A1C WAIVED: HB A1C (BAYER DCA - WAIVED): 6.1 % (ref <7.0)

## 2020-03-06 NOTE — Progress Notes (Signed)
BP (!) 157/82   Pulse 64   Temp (!) 97.3 F (36.3 C)   Ht _0  (1.803 m)   Wt 185 lb (83.9 kg)   SpO2 96%   BMI 25.80 kg/m    Subjective:   Patient ID: [name], male    DOB: 27-Nov-1929, 86 y.o.   MRN: [id number]  HPI: [name] is a 86 y.o. male presenting on 03/06/2020 for Medical Management of Chronic Issues and Diabetes   HPI Type 2 diabetes mellitus Patient comes in today for recheck of his diabetes. Patient has been currently taking benazepril and Metformin. Patient is currently on an ACE inhibitor/ARB. Patient has not seen an ophthalmologist this year. Patient denies any issues with their feet. The symptom started onset as an adult hypertension and hyperlipidemia and A. fib ARE RELATED TO DM   Hypertension Patient is currently on metoprolol and hydrochlorothiazide and hydralazine benazepril, and their blood pressure today is 127/70. Patient denies any lightheadedness or dizziness. Patient denies headaches, blurred vision, chest pains, shortness of breath, or weakness. Denies any side effects from medication and is content with current medication.   Hyperlipidemia Patient is coming in for recheck of his hyperlipidemia. The patient is currently taking fish oil and atorvastatin. They deny any issues with myalgias or history of liver damage from it. They deny any focal numbness or weakness or chest pain.   Paroxysmal A. fib recheck Patient is on Xarelto Reason on anticoagulation: Paroxysmal A. fib Patient denies any bruising or bleeding or chest pain or palpitations   Relevant past medical, surgical, family and social history reviewed and updated as indicated. Interim medical history since our last visit reviewed. Allergies and medications reviewed and updated.  Review of Systems  Constitutional: Negative for chills and fever.  Respiratory: Negative for shortness of breath and wheezing.   Cardiovascular: Negative for chest pain and leg swelling.    Musculoskeletal: Negative for back pain and gait problem.  Skin: Negative for rash.  Neurological: Negative for dizziness, weakness and light-headedness.  All other systems reviewed and are negative.   Per HPI unless specifically indicated above   Allergies as of 03/06/2020      Reactions   Alteplase Anaphylaxis   Codeine Other (See Comments)   Bad dreams   Lopid [gemfibrozil] Nausea Only   Voltaren [diclofenac Sodium] Diarrhea      Medication List       Accurate as of March 06, 2020  8:54 AM. If you have any questions, ask your nurse or doctor.        Accu-Chek FastClix Lancets Misc CHECK GLUCOSE 4 TIMES DAILY Dx E11.9   Accu-Chek Guide test strip Generic drug: glucose blood Test BS QID Dx E11.9   acetaminophen 325 MG tablet Commonly known as: TYLENOL Take 650 mg by mouth every 6 (six) hours as needed for mild pain, fever or headache.   albuterol 108 (90 Base) MCG/ACT inhaler Commonly known as: VENTOLIN HFA INHALE 1 TO 2 PUFFS BY MOUTH EVERY 6 HOURS AS NEEDED FOR WHEEZING FOR SHORTNESS OF BREATH What changed: See the new instructions.   atorvastatin 80 MG tablet Commonly known as: LIPITOR TAKE 1 TABLET BY MOUTH ONCE DAILY AT  6PM What changed: See the new instructions.   benazepril 20 MG tablet Commonly known as: LOTENSIN Take 1 tablet by mouth once daily   blood glucose meter kit and supplies Kit Dispense based on patient and insurance preference. Use four times daily as directed.  Diagnosis  type 2 diabetes   feeding supplement (GLUCERNA SHAKE) Liqd Take 237 mLs by mouth 2 (two) times daily between meals.   fish oil-omega-3 fatty acids 1000 MG capsule Take 2 g by mouth every evening.   Fluticasone-Salmeterol 100-50 MCG/DOSE Aepb Commonly known as: Advair Diskus INHALE ONE DOSE BY MOUTH EVERY 12 HOURS What changed:   how much to take  how to take this  when to take this  reasons to take this   hydrALAZINE 25 MG tablet Commonly known as:  APRESOLINE Take 1 tablet (25 mg total) by mouth daily as needed. What changed: reasons to take this   hydrochlorothiazide 25 MG tablet Commonly known as: HYDRODIURIL Take 1 tablet by mouth once daily What changed: how much to take   metFORMIN 500 MG 24 hr tablet Commonly known as: GLUCOPHAGE-XR Take 1 tablet (500 mg total) by mouth 2 (two) times daily. What changed: when to take this   metoprolol tartrate 100 MG tablet Commonly known as: LOPRESSOR Take 1 tablet by mouth twice daily   nitroGLYCERIN 0.4 MG SL tablet Commonly known as: NITROSTAT USE AS DIRECTED What changed:   how much to take  how to take this  when to take this  reasons to take this   pantoprazole 40 MG tablet Commonly known as: PROTONIX Take 1 tablet by mouth once daily   rivaroxaban 20 MG Tabs tablet Commonly known as: Xarelto TAKE 1 TABLET BY MOUTH EVERY DAY WITH SUPPER What changed:   how much to take  how to take this  when to take this   Vitamin D 50 MCG (2000 UT) Caps Take 2,000 Units by mouth daily.        Objective:   BP (!) 157/82   Pulse 64   Temp (!) 97.3 F (36.3 C)   Ht _0  (1.803 m)   Wt 185 lb (83.9 kg)   SpO2 96%   BMI 25.80 kg/m   Wt Readings from Last 3 Encounters:  03/06/20 185 lb (83.9 kg)  11/23/19 186 lb 8 oz (84.6 kg)  10/11/19 188 lb (85.3 kg)    Physical Exam Vitals and nursing note reviewed.  Constitutional:      General: He is not in acute distress.    Appearance: He is well-developed. He is not diaphoretic.  Eyes:     General: No scleral icterus.    Conjunctiva/sclera: Conjunctivae normal.  Neck:     Thyroid: No thyromegaly.  Cardiovascular:     Rate and Rhythm: Normal rate. Rhythm irregular.     Heart sounds: Murmur (Grade 2 out of 6 early systolic murmur right second intercostal space) heard.   Pulmonary:     Effort: Pulmonary effort is normal. No respiratory distress.     Breath sounds: Normal breath sounds. No wheezing.    Musculoskeletal:        General: Normal range of motion.     Cervical back: Neck supple.  Lymphadenopathy:     Cervical: No cervical adenopathy.  Skin:    General: Skin is warm and dry.     Findings: No rash.  Neurological:     Mental Status: He is alert and oriented to person, place, and time.     Coordination: Coordination normal.  Psychiatric:        Behavior: Behavior normal.       Assessment & Plan:   Problem List Items Addressed This Visit      Cardiovascular and Mediastinum   Hypertension associated with diabetes (  [place])   Relevant Orders   CMP14+EGFR   Paroxysmal atrial fibrillation ([place])     Endocrine   Hyperlipidemia associated with type 2 diabetes mellitus ([place])   Relevant Orders   Lipid panel   DM type 2 (diabetes mellitus, type 2) ([place]) - Primary   Relevant Orders   Bayer DCA Hb A1c Waived   CBC with Differential/Platelet   CMP14+EGFR     Other   History of CVA (cerebrovascular accident)    Patient had another recent TIA last month, already on blood thinner, recommended to go back to his neurologist and they will give them a call up and see, is stable now and was very transient and lasted a few minutes of right hand numbness and left facial numbness.  Was seen and evaluated in the emergency department and released.  No further symptoms like this.  A1c is 6.1 looking good, blood pressure looks good, no change in medication.  Follow up plan: Return in about 3 months (around 06/06/2020), or if symptoms worsen or fail to improve, for Diabetes recheck.  Counseling provided for all of the vaccine components Orders Placed This Encounter  Procedures  . Bayer DCA Hb A1c Waived  . CBC with Differential/Platelet  . CMP14+EGFR  . Lipid panel    Caryl Pina, MD [facility] 03/06/2020, 8:54 AM

## 2020-03-07 LAB — CMP14+EGFR
ALT: 17 IU/L (ref 0–44)
AST: 24 IU/L (ref 0–40)
Albumin/Globulin Ratio: 2.1 (ref 1.2–2.2)
Albumin: 4.6 g/dL (ref 3.6–4.6)
Alkaline Phosphatase: 85 IU/L (ref 48–121)
BUN/Creatinine Ratio: 12 (ref 10–24)
BUN: 16 mg/dL (ref 8–27)
Bilirubin Total: 0.9 mg/dL (ref 0.0–1.2)
CO2: 24 mmol/L (ref 20–29)
Calcium: 9.2 mg/dL (ref 8.6–10.2)
Chloride: 94 mmol/L — ABNORMAL LOW (ref 96–106)
Creatinine, Ser: 1.33 mg/dL — ABNORMAL HIGH (ref 0.76–1.27)
GFR calc Af Amer: 55 mL/min/{1.73_m2} — ABNORMAL LOW (ref >59)
GFR calc non Af Amer: 48 mL/min/{1.73_m2} — ABNORMAL LOW (ref >59)
Globulin, Total: 2.2 g/dL (ref 1.5–4.5)
Glucose: 141 mg/dL — ABNORMAL HIGH (ref 65–99)
Potassium: 4.2 mmol/L (ref 3.5–5.2)
Sodium: 134 mmol/L (ref 134–144)
Total Protein: 6.8 g/dL (ref 6.0–8.5)

## 2020-03-07 LAB — CBC WITH DIFFERENTIAL/PLATELET
Basophils Absolute: 0 10*3/uL (ref 0.0–0.2)
Basos: 1 %
EOS (ABSOLUTE): 0.3 10*3/uL (ref 0.0–0.4)
Eos: 3 %
Hematocrit: 41 % (ref 37.5–51.0)
Hemoglobin: 14.5 g/dL (ref 13.0–17.7)
Immature Grans (Abs): 0 10*3/uL (ref 0.0–0.1)
Immature Granulocytes: 0 %
Lymphocytes Absolute: 1.9 10*3/uL (ref 0.7–3.1)
Lymphs: 24 %
MCH: 34.2 pg — ABNORMAL HIGH (ref 26.6–33.0)
MCHC: 35.4 g/dL (ref 31.5–35.7)
MCV: 97 fL (ref 79–97)
Monocytes Absolute: 0.7 10*3/uL (ref 0.1–0.9)
Monocytes: 9 %
Neutrophils Absolute: 4.8 10*3/uL (ref 1.4–7.0)
Neutrophils: 63 %
Platelets: 193 10*3/uL (ref 150–450)
RBC: 4.24 x10E6/uL (ref 4.14–5.80)
RDW: 11.5 % — ABNORMAL LOW (ref 11.6–15.4)
WBC: 7.7 10*3/uL (ref 3.4–10.8)

## 2020-03-07 LAB — LIPID PANEL
Chol/HDL Ratio: 2.5 ratio (ref 0.0–5.0)
Cholesterol, Total: 94 mg/dL — ABNORMAL LOW (ref 100–199)
HDL: 37 mg/dL — ABNORMAL LOW (ref >39)
LDL Chol Calc (NIH): 40 mg/dL (ref 0–99)
Triglycerides: 84 mg/dL (ref 0–149)
VLDL Cholesterol Cal: 17 mg/dL (ref 5–40)

## 2020-03-11 ENCOUNTER — Other Ambulatory Visit: Payer: Self-pay | Admitting: Family Medicine

## 2020-03-11 ENCOUNTER — Other Ambulatory Visit: Payer: Self-pay | Admitting: Family

## 2020-03-11 DIAGNOSIS — I480 Paroxysmal atrial fibrillation: Secondary | ICD-10-CM

## 2020-03-31 LAB — CUP PACEART REMOTE DEVICE CHECK
Date Time Interrogation Session: 20210825233752
Implantable Pulse Generator Implant Date: 20180515

## 2020-04-01 ENCOUNTER — Other Ambulatory Visit: Payer: Self-pay | Admitting: Family Medicine

## 2020-04-01 ENCOUNTER — Ambulatory Visit (INDEPENDENT_AMBULATORY_CARE_PROVIDER_SITE_OTHER): Payer: Medicare Other | Admitting: *Deleted

## 2020-04-01 DIAGNOSIS — E1159 Type 2 diabetes mellitus with other circulatory complications: Secondary | ICD-10-CM

## 2020-04-01 DIAGNOSIS — Z8673 Personal history of transient ischemic attack (TIA), and cerebral infarction without residual deficits: Secondary | ICD-10-CM | POA: Diagnosis not present

## 2020-04-02 ENCOUNTER — Other Ambulatory Visit: Payer: Self-pay

## 2020-04-02 ENCOUNTER — Encounter: Payer: Self-pay | Admitting: *Deleted

## 2020-04-02 ENCOUNTER — Encounter: Payer: Self-pay | Admitting: Dermatology

## 2020-04-02 ENCOUNTER — Ambulatory Visit: Payer: Medicare Other | Admitting: Dermatology

## 2020-04-02 DIAGNOSIS — D0421 Carcinoma in situ of skin of right ear and external auricular canal: Secondary | ICD-10-CM | POA: Diagnosis not present

## 2020-04-02 DIAGNOSIS — D485 Neoplasm of uncertain behavior of skin: Secondary | ICD-10-CM | POA: Diagnosis not present

## 2020-04-02 DIAGNOSIS — L570 Actinic keratosis: Secondary | ICD-10-CM

## 2020-04-02 DIAGNOSIS — D0439 Carcinoma in situ of skin of other parts of face: Secondary | ICD-10-CM | POA: Diagnosis not present

## 2020-04-02 DIAGNOSIS — C44319 Basal cell carcinoma of skin of other parts of face: Secondary | ICD-10-CM | POA: Diagnosis not present

## 2020-04-02 DIAGNOSIS — C44629 Squamous cell carcinoma of skin of left upper limb, including shoulder: Secondary | ICD-10-CM | POA: Diagnosis not present

## 2020-04-02 NOTE — Patient Instructions (Signed)

## 2020-04-02 NOTE — Progress Notes (Signed)
LN2 PER DR TAFEEN  RIGHT EAR X2RIGHT ARM X 2 LEFT ARM X 4 FACE X 4 TOTAL 15 PER ST

## 2020-04-03 NOTE — Progress Notes (Signed)
Carelink Summary Report / Loop Recorder 

## 2020-04-09 ENCOUNTER — Telehealth: Payer: Self-pay | Admitting: Dermatology

## 2020-04-09 ENCOUNTER — Telehealth: Payer: Self-pay

## 2020-04-09 NOTE — Telephone Encounter (Signed)
-----   Message from Lavonna Monarch, MD sent at 04/04/2020  7:13 AM EDT ----- 30-minute surgery with Dr. Darene Lamer for #1+ #3+ to discuss removal of #2.

## 2020-04-09 NOTE — Telephone Encounter (Signed)
Patient is calling for pathology results from last visit with Stuart Tafeen, MD 

## 2020-04-10 NOTE — Telephone Encounter (Signed)
Pathology given to patient he has a October surgery already

## 2020-04-10 NOTE — Telephone Encounter (Signed)
-----   Message from Lavonna Monarch, MD sent at 04/04/2020  7:13 AM EDT ----- 30-minute surgery with Dr. Darene Lamer for #1+ #3+ to discuss removal of #2.

## 2020-04-16 DIAGNOSIS — L84 Corns and callosities: Secondary | ICD-10-CM | POA: Diagnosis not present

## 2020-04-16 DIAGNOSIS — E1142 Type 2 diabetes mellitus with diabetic polyneuropathy: Secondary | ICD-10-CM | POA: Diagnosis not present

## 2020-04-16 DIAGNOSIS — M79676 Pain in unspecified toe(s): Secondary | ICD-10-CM | POA: Diagnosis not present

## 2020-04-16 DIAGNOSIS — B351 Tinea unguium: Secondary | ICD-10-CM | POA: Diagnosis not present

## 2020-04-24 ENCOUNTER — Other Ambulatory Visit: Payer: Self-pay | Admitting: Family Medicine

## 2020-04-24 DIAGNOSIS — E1169 Type 2 diabetes mellitus with other specified complication: Secondary | ICD-10-CM

## 2020-04-24 DIAGNOSIS — I152 Hypertension secondary to endocrine disorders: Secondary | ICD-10-CM

## 2020-04-27 ENCOUNTER — Encounter: Payer: Self-pay | Admitting: Dermatology

## 2020-04-27 NOTE — Progress Notes (Signed)
   Follow-Up Visit   Subjective  [name] is a 86 y.o. male who presents for the following: Annual Exam (LEFT ELBOW X MONTHS HORN).  New crusts Location: Left ear and left arm Duration:  Quality:  Associated Signs/Symptoms: Modifying Factors:  Severity:  Timing: Context:   Objective  Well appearing patient in no apparent distress; mood and affect are within normal limits.  Focused examination including head, neck, back, upper chest, arms, hands.   Assessment & Plan    Neoplasm of uncertain behavior of skin (3) Left SIDEBURN ANTERIOR  Skin / nail biopsy Type of biopsy: tangential   Informed consent: discussed and consent obtained   Timeout: patient name, date of birth, surgical site, and procedure verified   Procedure prep:  Patient was prepped and draped in usual sterile fashion Prep type:  Chlorhexidine Anesthesia: the lesion was anesthetized in a standard fashion   Anesthetic:  1% lidocaine w/ epinephrine 1-100,000 local infiltration Instrument used: flexible razor blade   Hemostasis achieved with: ferric subsulfate   Outcome: patient tolerated procedure well   Post-procedure details: wound care instructions given    Specimen 1 - Surgical pathology Differential Diagnosis: BCC SCC Check Margins: No  Left SIDEBURN POSTERIOR  Skin / nail biopsy Type of biopsy: tangential   Informed consent: discussed and consent obtained   Timeout: patient name, date of birth, surgical site, and procedure verified   Procedure prep:  Patient was prepped and draped in usual sterile fashion Prep type:  Chlorhexidine Anesthesia: the lesion was anesthetized in a standard fashion   Anesthetic:  1% lidocaine w/ epinephrine 1-100,000 local infiltration Instrument used: flexible razor blade   Hemostasis achieved with: ferric subsulfate   Outcome: patient tolerated procedure well   Post-procedure details: wound care instructions given    Specimen 2 - Surgical  pathology Differential Diagnosis: BCC SCC Check Margins: No  Left Forearm - Posterior  Skin / nail biopsy Type of biopsy: tangential   Informed consent: discussed and consent obtained   Timeout: patient name, date of birth, surgical site, and procedure verified   Procedure prep:  Patient was prepped and draped in usual sterile fashion Prep type:  Chlorhexidine Anesthesia: the lesion was anesthetized in a standard fashion   Anesthetic:  1% lidocaine w/ epinephrine 1-100,000 local infiltration Instrument used: flexible razor blade   Hemostasis achieved with: ferric subsulfate   Outcome: patient tolerated procedure well   Post-procedure details: wound care instructions given    Specimen 3 - Surgical pathology Differential Diagnosis: BCC SCC Check Margins: No  AK (actinic keratosis) (12) Right Ear (2); Left Forearm - Posterior (4); Right Forearm - Posterior (2); Mid Forehead (4)  Destruction of lesion - Left Forearm - Posterior, Mid Forehead, Right Ear, Right Forearm - Posterior Complexity: simple   Destruction method: cryotherapy   Informed consent: discussed and consent obtained   Timeout:  patient name, date of birth, surgical site, and procedure verified Lesion destroyed using liquid nitrogen: Yes   Region frozen until ice ball extended beyond lesion: Yes   Cryotherapy cycles:  5 Outcome: patient tolerated procedure well with no complications       I, Lavonna Monarch, MD, have reviewed all documentation for this visit.  The documentation on 04/27/20 for the exam, diagnosis, procedures, and orders are all accurate and complete.

## 2020-05-03 ENCOUNTER — Other Ambulatory Visit: Payer: Self-pay | Admitting: Family Medicine

## 2020-05-03 DIAGNOSIS — I152 Hypertension secondary to endocrine disorders: Secondary | ICD-10-CM

## 2020-05-06 ENCOUNTER — Ambulatory Visit (INDEPENDENT_AMBULATORY_CARE_PROVIDER_SITE_OTHER): Payer: Medicare Other

## 2020-05-06 DIAGNOSIS — I639 Cerebral infarction, unspecified: Secondary | ICD-10-CM | POA: Diagnosis not present

## 2020-05-06 LAB — CUP PACEART REMOTE DEVICE CHECK
Date Time Interrogation Session: 20210925233727
Implantable Pulse Generator Implant Date: 20180515

## 2020-05-07 ENCOUNTER — Other Ambulatory Visit: Payer: Self-pay | Admitting: Family Medicine

## 2020-05-07 DIAGNOSIS — E1159 Type 2 diabetes mellitus with other circulatory complications: Secondary | ICD-10-CM

## 2020-05-07 NOTE — Progress Notes (Signed)
Carelink Summary Report / Loop Recorder 

## 2020-05-09 ENCOUNTER — Ambulatory Visit (INDEPENDENT_AMBULATORY_CARE_PROVIDER_SITE_OTHER): Payer: Medicare Other | Admitting: Dermatology

## 2020-05-09 ENCOUNTER — Other Ambulatory Visit: Payer: Self-pay

## 2020-05-09 ENCOUNTER — Encounter: Payer: Self-pay | Admitting: Dermatology

## 2020-05-09 DIAGNOSIS — D099 Carcinoma in situ, unspecified: Secondary | ICD-10-CM

## 2020-05-09 DIAGNOSIS — D0439 Carcinoma in situ of skin of other parts of face: Secondary | ICD-10-CM

## 2020-05-09 DIAGNOSIS — C44622 Squamous cell carcinoma of skin of right upper limb, including shoulder: Secondary | ICD-10-CM

## 2020-05-09 DIAGNOSIS — C4492 Squamous cell carcinoma of skin, unspecified: Secondary | ICD-10-CM

## 2020-05-09 NOTE — Patient Instructions (Signed)

## 2020-05-09 NOTE — Progress Notes (Addendum)
Given 3 bottle of oxistat (41ml)  Left sideburn anterior - curet 5FU 1.1 cm size Tx  Left forearm posterior - curet cautery 5FU 1.2 cm size Tx

## 2020-05-20 ENCOUNTER — Other Ambulatory Visit: Payer: Self-pay | Admitting: Family Medicine

## 2020-05-29 ENCOUNTER — Ambulatory Visit (INDEPENDENT_AMBULATORY_CARE_PROVIDER_SITE_OTHER): Payer: Medicare Other

## 2020-05-29 DIAGNOSIS — I639 Cerebral infarction, unspecified: Secondary | ICD-10-CM

## 2020-05-29 LAB — CUP PACEART REMOTE DEVICE CHECK
Date Time Interrogation Session: 20211026234002
Implantable Pulse Generator Implant Date: 20180515

## 2020-06-03 NOTE — Progress Notes (Signed)
Carelink Summary Report / Loop Recorder 

## 2020-06-06 ENCOUNTER — Encounter: Payer: Self-pay | Admitting: Family Medicine

## 2020-06-06 ENCOUNTER — Ambulatory Visit (INDEPENDENT_AMBULATORY_CARE_PROVIDER_SITE_OTHER): Payer: Medicare Other | Admitting: Family Medicine

## 2020-06-06 ENCOUNTER — Other Ambulatory Visit: Payer: Self-pay

## 2020-06-06 VITALS — BP 166/85 | HR 58 | Temp 97.7°F | Ht 71.0 in | Wt 188.0 lb

## 2020-06-06 DIAGNOSIS — E785 Hyperlipidemia, unspecified: Secondary | ICD-10-CM | POA: Diagnosis not present

## 2020-06-06 DIAGNOSIS — I480 Paroxysmal atrial fibrillation: Secondary | ICD-10-CM | POA: Diagnosis not present

## 2020-06-06 DIAGNOSIS — E1159 Type 2 diabetes mellitus with other circulatory complications: Secondary | ICD-10-CM

## 2020-06-06 DIAGNOSIS — E1169 Type 2 diabetes mellitus with other specified complication: Secondary | ICD-10-CM | POA: Diagnosis not present

## 2020-06-06 DIAGNOSIS — I152 Hypertension secondary to endocrine disorders: Secondary | ICD-10-CM | POA: Diagnosis not present

## 2020-06-06 LAB — BAYER DCA HB A1C WAIVED: HB A1C (BAYER DCA - WAIVED): 5.7 % (ref <7.0)

## 2020-06-06 MED ORDER — BENAZEPRIL HCL 20 MG PO TABS: 20.0000 mg | ORAL_TABLET | Freq: Every day | ORAL | 3 refills | Status: DC

## 2020-06-06 MED ORDER — RIVAROXABAN 20 MG PO TABS: 20.0000 mg | ORAL_TABLET | Freq: Every day | ORAL | 3 refills | Status: DC

## 2020-06-06 MED ORDER — ATORVASTATIN CALCIUM 80 MG PO TABS: 80.0000 mg | ORAL_TABLET | Freq: Every day | ORAL | 3 refills | Status: DC

## 2020-06-06 MED ORDER — METOPROLOL TARTRATE 100 MG PO TABS: 100.0000 mg | ORAL_TABLET | Freq: Two times a day (BID) | ORAL | 3 refills | Status: DC

## 2020-06-06 MED ORDER — HYDROCHLOROTHIAZIDE 25 MG PO TABS: 25.0000 mg | ORAL_TABLET | Freq: Every day | ORAL | 3 refills | Status: DC

## 2020-06-06 MED ORDER — PANTOPRAZOLE SODIUM 40 MG PO TBEC: 40.0000 mg | DELAYED_RELEASE_TABLET | Freq: Every day | ORAL | 3 refills | Status: DC

## 2020-06-06 MED ORDER — HYDRALAZINE HCL 25 MG PO TABS: 25.0000 mg | ORAL_TABLET | Freq: Every day | ORAL | 3 refills | Status: DC | PRN

## 2020-06-06 MED ORDER — METFORMIN HCL ER 500 MG PO TB24: 500.0000 mg | ORAL_TABLET | Freq: Two times a day (BID) | ORAL | 3 refills | Status: DC

## 2020-06-06 NOTE — Progress Notes (Signed)
BP (!) 177/87   Pulse (!) 58   Temp 97.7 F (36.5 C) (Temporal)   Ht _0  (1.803 m)   Wt 188 lb (85.3 kg)   BMI 26.22 kg/m    Subjective:   Patient ID: [name], male    DOB: [birth year], 86 y.o.   MRN: [id number]  HPI: BAYLEY Ortega is a 86 y.o. male presenting on 06/06/2020 for Diabetes (3 mos ckup), Hypertension, Hyperlipidemia, and Gastroesophageal Reflux   HPI Type 2 diabetes mellitus Patient comes in today for recheck of his diabetes. Patient has been currently taking Metformin, A1c is 5.7. Patient is currently on an ACE inhibitor/ARB. Patient has seen an ophthalmologist this year. Patient denies any issues with their feet. The symptom started onset as an adult hypertension hyperlipidemia ARE RELATED TO DM   Hypertension Patient is currently on benazepril and hydrochlorothiazide and metoprolol and hydralazine as needed, and their blood pressure today is 177/87.  Patient is consistently typically running in the 120s 130s over 70s at home and that it does come up when he goes out and is active.. Patient denies any lightheadedness or dizziness. Patient denies headaches, blurred vision, chest pains, shortness of breath, or weakness. Denies any side effects from medication and is content with current medication.   Hyperlipidemia Patient is coming in for recheck of his hyperlipidemia. The patient is currently taking atorvastatin and fish oil. They deny any issues with myalgias or history of liver damage from it. They deny any focal numbness or weakness or chest pain.   Patient has been diagnosed proximal A. fib, he currently takes Xarelto for anticoagulation and metoprolol for rate control, denies any palpitations or flutters or chest pain or shortness of breath.  Relevant past medical, surgical, family and social history reviewed and updated as indicated. Interim medical history since our last visit reviewed. Allergies and medications reviewed and updated.  Review of Systems   Constitutional: Negative for chills and fever.  Eyes: Negative for visual disturbance.  Respiratory: Negative for shortness of breath and wheezing.   Cardiovascular: Negative for chest pain and leg swelling.  Musculoskeletal: Negative for back pain and gait problem.  Skin: Negative for rash.  Neurological: Negative for dizziness, weakness and numbness.  All other systems reviewed and are negative.   Per HPI unless specifically indicated above   Allergies as of 06/06/2020      Reactions   Alteplase Anaphylaxis   Codeine Other (See Comments)   Bad dreams   Lopid [gemfibrozil] Nausea Only   Voltaren [diclofenac Sodium] Diarrhea      Medication List       Accurate as of June 06, 2020  8:28 AM. If you have any questions, ask your nurse or doctor.        Accu-Chek FastClix Lancets Misc CHECK GLUCOSE 4 TIMES DAILY Dx E11.9   Accu-Chek Guide test strip Generic drug: glucose blood Test BS QID Dx E11.9   acetaminophen 325 MG tablet Commonly known as: TYLENOL Take 650 mg by mouth every 6 (six) hours as needed for mild pain, fever or headache.   Advair Diskus 100-50 MCG/DOSE Aepb Generic drug: Fluticasone-Salmeterol INHALE 1 DOSE BY MOUTH EVERY 12 HOURS   albuterol 108 (90 Base) MCG/ACT inhaler Commonly known as: VENTOLIN HFA INHALE 1 TO 2 PUFFS BY MOUTH EVERY 6 HOURS AS NEEDED FOR WHEEZING FOR SHORTNESS OF BREATH   atorvastatin 80 MG tablet Commonly known as: LIPITOR TAKE 1 TABLET BY MOUTH ONCE DAILY AT  [facility]  benazepril 20 MG tablet Commonly known as: LOTENSIN Take 1 tablet by mouth once daily   blood glucose meter kit and supplies Kit Dispense based on patient and insurance preference. Use four times daily as directed.  Diagnosis type 2 diabetes   feeding supplement (GLUCERNA SHAKE) Liqd Take 237 mLs by mouth 2 (two) times daily between meals.   fish oil-omega-3 fatty acids 1000 MG capsule Take 2 g by mouth every evening.   hydrALAZINE 25 MG  tablet Commonly known as: APRESOLINE Take 1 tablet (25 mg total) by mouth daily as needed. What changed: reasons to take this   hydrochlorothiazide 25 MG tablet Commonly known as: HYDRODIURIL Take 1 tablet by mouth once daily What changed: how much to take   metFORMIN 500 MG 24 hr tablet Commonly known as: GLUCOPHAGE-XR Take 1 tablet by mouth twice daily   metoprolol tartrate 100 MG tablet Commonly known as: LOPRESSOR Take 1 tablet by mouth twice daily   nitroGLYCERIN 0.4 MG SL tablet Commonly known as: NITROSTAT USE AS DIRECTED What changed:   how much to take  how to take this  when to take this  reasons to take this   pantoprazole 40 MG tablet Commonly known as: PROTONIX Take 1 tablet by mouth once daily   Vitamin D 50 MCG (2000 UT) Caps Take 2,000 Units by mouth daily.   Xarelto 20 MG Tabs tablet Generic drug: rivaroxaban TAKE 1 TABLET BY MOUTH ONCE DAILY WITH SUPPER        Objective:   BP (!) 177/87   Pulse (!) 58   Temp 97.7 F (36.5 C) (Temporal)   Ht $R'5\' 11"'Sm$  (1.803 m)   Wt 188 lb (85.3 kg)   BMI 26.22 kg/m   Wt Readings from Last 3 Encounters:  06/06/20 188 lb (85.3 kg)  03/06/20 185 lb (83.9 kg)  11/23/19 186 lb 8 oz (84.6 kg)    Physical Exam Vitals and nursing note reviewed.  Constitutional:      General: He is not in acute distress.    Appearance: He is well-developed. He is not diaphoretic.  Eyes:     General: No scleral icterus.    Conjunctiva/sclera: Conjunctivae normal.  Neck:     Thyroid: No thyromegaly.  Cardiovascular:     Rate and Rhythm: Normal rate and regular rhythm.     Heart sounds: Normal heart sounds. No murmur heard.   Pulmonary:     Effort: Pulmonary effort is normal. No respiratory distress.     Breath sounds: Normal breath sounds. No wheezing.  Musculoskeletal:        General: Normal range of motion.     Cervical back: Neck supple.  Lymphadenopathy:     Cervical: No cervical adenopathy.  Skin:     General: Skin is warm and dry.     Findings: No rash.  Neurological:     Mental Status: He is alert and oriented to person, place, and time.     Coordination: Coordination normal.  Psychiatric:        Behavior: Behavior normal.       Assessment & Plan:   Problem List Items Addressed This Visit      Cardiovascular and Mediastinum   Hypertension associated with diabetes ([place])   Relevant Medications   rivaroxaban (XARELTO) 20 MG TABS tablet   metoprolol tartrate (LOPRESSOR) 100 MG tablet   metFORMIN (GLUCOPHAGE-XR) 500 MG 24 hr tablet   hydrochlorothiazide (HYDRODIURIL) 25 MG tablet   hydrALAZINE (APRESOLINE) 25 MG tablet  benazepril (LOTENSIN) 20 MG tablet   atorvastatin (LIPITOR) 80 MG tablet   Other Relevant Orders   CMP14+EGFR   Paroxysmal atrial fibrillation ([facility])   Relevant Medications   rivaroxaban (XARELTO) 20 MG TABS tablet   metoprolol tartrate (LOPRESSOR) 100 MG tablet   hydrochlorothiazide (HYDRODIURIL) 25 MG tablet   hydrALAZINE (APRESOLINE) 25 MG tablet   benazepril (LOTENSIN) 20 MG tablet   atorvastatin (LIPITOR) 80 MG tablet     Endocrine   Hyperlipidemia associated with type 2 diabetes mellitus ([facility])   Relevant Medications   metFORMIN (GLUCOPHAGE-XR) 500 MG 24 hr tablet   benazepril (LOTENSIN) 20 MG tablet   atorvastatin (LIPITOR) 80 MG tablet   DM type 2 (diabetes mellitus, type 2) ([facility]) - Primary   Relevant Medications   metFORMIN (GLUCOPHAGE-XR) 500 MG 24 hr tablet   benazepril (LOTENSIN) 20 MG tablet   atorvastatin (LIPITOR) 80 MG tablet   Other Relevant Orders   Bayer DCA Hb A1c Waived   CMP14+EGFR      Continue current medication, A1c looks great, discussed with patient possibly stopping the Metformin Follow up plan: Return in about 3 months (around 09/06/2020), or if symptoms worsen or fail to improve, for Hypertension hyperlipidemia diabetes.  Counseling provided for all of the vaccine components Orders Placed This Encounter  Procedures   . Bayer [facility] Hb A1c Waived    Caryl Pina, MD [facility] 06/06/2020, 8:28 AM

## 2020-06-07 LAB — CMP14+EGFR
ALT: 18 IU/L (ref 0–44)
AST: 22 IU/L (ref 0–40)
Albumin/Globulin Ratio: 2 (ref 1.2–2.2)
Albumin: 4.5 g/dL (ref 3.6–4.6)
Alkaline Phosphatase: 79 IU/L (ref 44–121)
BUN/Creatinine Ratio: 10 (ref 10–24)
BUN: 11 mg/dL (ref 8–27)
Bilirubin Total: 0.9 mg/dL (ref 0.0–1.2)
CO2: 25 mmol/L (ref 20–29)
Calcium: 9 mg/dL (ref 8.6–10.2)
Chloride: 96 mmol/L (ref 96–106)
Creatinine, Ser: 1.06 mg/dL (ref 0.76–1.27)
GFR calc Af Amer: 72 mL/min/{1.73_m2} (ref >59)
GFR calc non Af Amer: 62 mL/min/{1.73_m2} (ref >59)
Globulin, Total: 2.2 g/dL (ref 1.5–4.5)
Glucose: 140 mg/dL — ABNORMAL HIGH (ref 65–99)
Potassium: 4 mmol/L (ref 3.5–5.2)
Sodium: 135 mmol/L (ref 134–144)
Total Protein: 6.7 g/dL (ref 6.0–8.5)

## 2020-06-12 NOTE — Addendum Note (Signed)
Addended by: Sheran Lawless on: 06/12/2020 10:35 AM   Modules accepted: Orders

## 2020-06-16 ENCOUNTER — Encounter: Payer: Self-pay | Admitting: Dermatology

## 2020-06-16 NOTE — Progress Notes (Signed)
   Follow-Up Visit   Subjective  [name] is a 85 y.o. male who presents for the following: Procedure.  Biopsy-proven skin cancers Location: Left arm and left cheek Duration:  Quality:  Associated Signs/Symptoms: Modifying Factors:  Severity:  Timing: Context: Here for treatment  Objective  Well appearing patient in no apparent distress; mood and affect are within normal limits.  A focused examination was performed including Head, neck, arms.. Relevant physical exam findings are noted in the Assessment and Plan.   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  Destruction of lesion Complexity: simple   Destruction method: electrodesiccation and curettage   Informed consent: discussed and consent obtained   Timeout:  patient name, date of birth, surgical site, and procedure verified Anesthesia: the lesion was anesthetized in a standard fashion   Anesthetic:  1% lidocaine w/ epinephrine 1-100,000 local infiltration Curettage performed in three different directions: Yes   Curettage cycles:  3 Lesion length (cm):  1.2 Lesion width (cm):  1 Hemostasis achieved with:  ferric subsulfate Outcome: patient tolerated procedure well with no complications   Post-procedure details: sterile dressing applied and wound care instructions given   Dressing type: bandage and petrolatum   Additional details:  Wound inoculated with 5% Fluorouracil.    Destruction of lesion  Squamous cell carcinoma of skin Right Forearm - Posterior  Destruction of lesion Complexity: simple   Destruction method: electrodesiccation and curettage   Informed consent: discussed and consent obtained   Timeout:  patient name, date of birth, surgical site, and procedure verified Anesthesia: the lesion was anesthetized in a standard fashion   Anesthetic:  1% lidocaine w/ epinephrine 1-100,000 local infiltration Curettage performed in three different directions: Yes     Curettage cycles:  3 Lesion length (cm):  1.1 Lesion width (cm):  1 Margin per side (cm):  0 Final wound size (cm):  1.1 Hemostasis achieved with:  ferric subsulfate Outcome: patient tolerated procedure well with no complications   Post-procedure details: sterile dressing applied and wound care instructions given   Dressing type: bandage and petrolatum   Additional details:  Wound inoculated with 5% Fluorouracil.       I, Lavonna Monarch, MD, have reviewed all documentation for this visit.  The documentation on 06/16/20 for the exam, diagnosis, procedures, and orders are all accurate and complete.

## 2020-06-24 ENCOUNTER — Other Ambulatory Visit: Payer: Self-pay | Admitting: Family Medicine

## 2020-06-24 DIAGNOSIS — I6782 Cerebral ischemia: Secondary | ICD-10-CM | POA: Diagnosis not present

## 2020-06-24 DIAGNOSIS — I67848 Other cerebrovascular vasospasm and vasoconstriction: Secondary | ICD-10-CM | POA: Diagnosis not present

## 2020-06-24 DIAGNOSIS — I10 Essential (primary) hypertension: Secondary | ICD-10-CM | POA: Insufficient documentation

## 2020-06-25 DIAGNOSIS — L84 Corns and callosities: Secondary | ICD-10-CM | POA: Diagnosis not present

## 2020-06-25 DIAGNOSIS — M79676 Pain in unspecified toe(s): Secondary | ICD-10-CM | POA: Diagnosis not present

## 2020-06-25 DIAGNOSIS — B351 Tinea unguium: Secondary | ICD-10-CM | POA: Diagnosis not present

## 2020-06-25 DIAGNOSIS — E1142 Type 2 diabetes mellitus with diabetic polyneuropathy: Secondary | ICD-10-CM | POA: Diagnosis not present

## 2020-06-30 LAB — CUP PACEART REMOTE DEVICE CHECK
Date Time Interrogation Session: 20211126224306
Implantable Pulse Generator Implant Date: 20180515

## 2020-07-01 ENCOUNTER — Ambulatory Visit (INDEPENDENT_AMBULATORY_CARE_PROVIDER_SITE_OTHER): Payer: Medicare Other

## 2020-07-01 DIAGNOSIS — I639 Cerebral infarction, unspecified: Secondary | ICD-10-CM | POA: Diagnosis not present

## 2020-07-05 NOTE — Progress Notes (Signed)
Carelink Summary Report / Loop Recorder 

## 2020-07-11 ENCOUNTER — Other Ambulatory Visit: Payer: Self-pay

## 2020-07-11 ENCOUNTER — Ambulatory Visit (INDEPENDENT_AMBULATORY_CARE_PROVIDER_SITE_OTHER): Payer: Medicare Other | Admitting: Dermatology

## 2020-07-11 DIAGNOSIS — C44219 Basal cell carcinoma of skin of left ear and external auricular canal: Secondary | ICD-10-CM

## 2020-07-11 NOTE — Patient Instructions (Signed)

## 2020-07-13 ENCOUNTER — Encounter: Payer: Self-pay | Admitting: Dermatology

## 2020-07-13 NOTE — Progress Notes (Addendum)
   Follow-Up Visit   Subjective  [name] is a 86 y.o. male who presents for the following: Procedure (Treatment of BCC on left tragus. ).  BCC Location: Front of left ear Duration:  Quality:  Associated Signs/Symptoms: Modifying Factors:  Severity:  Timing: Context: For treatment  Objective  Well appearing patient in no apparent distress; mood and affect are within normal limits. Objective  Left Tragus: Lesion identified in room by Nurse and Dr.Aspen Lawrance. [id number] 5-0- vicryl x1  5-0- nylon x 4    A focused examination was performed including Head and neck.. Relevant physical exam findings are noted in the Assessment and Plan.   Assessment & Plan   Head and neck. Basal cell carcinoma (BCC) of skin of left ear Left Tragus  Skin excision  Lesion length (cm):  1.6 Lesion width (cm):  1.6 Margin per side (cm):  0.2 Total excision diameter (cm):  2 Informed consent: discussed and consent obtained   Timeout: patient name, date of birth, surgical site, and procedure verified   Anesthesia: the lesion was anesthetized in a standard fashion   Anesthetic:  1% lidocaine w/ epinephrine 1-100,000 local infiltration Instrument used: #15 blade   Hemostasis achieved with: pressure and electrodesiccation   Outcome: patient tolerated procedure well with no complications   Post-procedure details: sterile dressing applied and wound care instructions given   Dressing type: bandage, petrolatum and pressure dressing    Skin repair Complexity:  Intermediate Final length (cm):  2.5 Informed consent: discussed and consent obtained   Timeout: patient name, date of birth, surgical site, and procedure verified   Reason for type of repair: reduce tension to allow closure, reduce the risk of dehiscence, infection, and necrosis and reduce subcutaneous dead space and avoid a hematoma   Undermining: edges could be approximated without difficulty   Subcutaneous layers (deep stitches):   Suture size:  5-0 Suture type: Vicryl (polyglactin 910)   Fine/surface layer approximation (top stitches):  Suture size:  5-0 Suture type: nylon    Specimen 1 - Surgical pathology Differential Diagnosis: scc vs bcc  Check Margins: yes superior margin stain [id number]  Curette x3 disclosed significant deep dermal involvement, base and edges cauterized and recuretted followed by narrow margin excision and layered closure with 5-0 Vicryl +5 0 nylon.     I, Lavonna Monarch, MD, have reviewed all documentation for this visit.  The documentation on 07/17/20 for the exam, diagnosis, procedures, and orders are all accurate and complete.

## 2020-07-15 ENCOUNTER — Other Ambulatory Visit: Payer: Self-pay | Admitting: Family Medicine

## 2020-07-18 ENCOUNTER — Ambulatory Visit (INDEPENDENT_AMBULATORY_CARE_PROVIDER_SITE_OTHER): Payer: Medicare Other

## 2020-07-18 ENCOUNTER — Other Ambulatory Visit: Payer: Self-pay

## 2020-07-18 DIAGNOSIS — Z4802 Encounter for removal of sutures: Secondary | ICD-10-CM

## 2020-07-18 NOTE — Progress Notes (Signed)
Suture removal no signs or symptoms of infection

## 2020-07-31 LAB — CUP PACEART REMOTE DEVICE CHECK
Date Time Interrogation Session: 20211227224433
Implantable Pulse Generator Implant Date: 20180515

## 2020-08-05 ENCOUNTER — Ambulatory Visit (INDEPENDENT_AMBULATORY_CARE_PROVIDER_SITE_OTHER): Payer: Medicare Other

## 2020-08-05 DIAGNOSIS — I480 Paroxysmal atrial fibrillation: Secondary | ICD-10-CM

## 2020-08-20 NOTE — Progress Notes (Signed)
Carelink Summary Report / Loop Recorder 

## 2020-08-23 ENCOUNTER — Other Ambulatory Visit: Payer: Self-pay | Admitting: Family Medicine

## 2020-08-30 ENCOUNTER — Ambulatory Visit (INDEPENDENT_AMBULATORY_CARE_PROVIDER_SITE_OTHER): Payer: Medicare Other

## 2020-08-30 DIAGNOSIS — H903 Sensorineural hearing loss, bilateral: Secondary | ICD-10-CM | POA: Diagnosis not present

## 2020-08-30 DIAGNOSIS — H6123 Impacted cerumen, bilateral: Secondary | ICD-10-CM | POA: Diagnosis not present

## 2020-08-30 DIAGNOSIS — Z87891 Personal history of nicotine dependence: Secondary | ICD-10-CM | POA: Diagnosis not present

## 2020-08-30 DIAGNOSIS — I639 Cerebral infarction, unspecified: Secondary | ICD-10-CM

## 2020-08-30 DIAGNOSIS — J343 Hypertrophy of nasal turbinates: Secondary | ICD-10-CM | POA: Diagnosis not present

## 2020-08-30 DIAGNOSIS — J329 Chronic sinusitis, unspecified: Secondary | ICD-10-CM | POA: Diagnosis not present

## 2020-08-30 DIAGNOSIS — J328 Other chronic sinusitis: Secondary | ICD-10-CM | POA: Diagnosis not present

## 2020-08-30 DIAGNOSIS — J342 Deviated nasal septum: Secondary | ICD-10-CM | POA: Diagnosis not present

## 2020-08-30 LAB — CUP PACEART REMOTE DEVICE CHECK
Date Time Interrogation Session: 20220127224945
Implantable Pulse Generator Implant Date: 20180515

## 2020-09-03 DIAGNOSIS — B351 Tinea unguium: Secondary | ICD-10-CM | POA: Diagnosis not present

## 2020-09-03 DIAGNOSIS — E1142 Type 2 diabetes mellitus with diabetic polyneuropathy: Secondary | ICD-10-CM | POA: Diagnosis not present

## 2020-09-03 DIAGNOSIS — M79676 Pain in unspecified toe(s): Secondary | ICD-10-CM | POA: Diagnosis not present

## 2020-09-03 DIAGNOSIS — L84 Corns and callosities: Secondary | ICD-10-CM | POA: Diagnosis not present

## 2020-09-06 ENCOUNTER — Telehealth: Payer: Self-pay | Admitting: Emergency Medicine

## 2020-09-06 NOTE — Telephone Encounter (Signed)
LMOM to call office in reference to loop recorder at RRT per DPR.  Discuss option to explant or leave device in place.

## 2020-09-07 NOTE — Progress Notes (Signed)
Carelink Summary Report / Loop Recorder 

## 2020-09-09 NOTE — Telephone Encounter (Signed)
I let the pt wife know that his loop has reached RRT. He can choose to leave it in and it will not harm him. He can choose to have it remove. If he choose to have it remove he would need to make an appointment with Dr. Rayann Heman to discuss having it removed. I ordered the patient a return kit for the monitor and taken him out of [facility] and marked him Inactive in [facility].

## 2020-09-11 ENCOUNTER — Ambulatory Visit (INDEPENDENT_AMBULATORY_CARE_PROVIDER_SITE_OTHER): Payer: Medicare Other | Admitting: Family Medicine

## 2020-09-11 ENCOUNTER — Encounter: Payer: Self-pay | Admitting: Family Medicine

## 2020-09-11 ENCOUNTER — Other Ambulatory Visit: Payer: Self-pay

## 2020-09-11 VITALS — BP 136/74 | HR 59 | Ht 71.0 in | Wt 181.0 lb

## 2020-09-11 DIAGNOSIS — Z23 Encounter for immunization: Secondary | ICD-10-CM

## 2020-09-11 DIAGNOSIS — I152 Hypertension secondary to endocrine disorders: Secondary | ICD-10-CM

## 2020-09-11 DIAGNOSIS — E1169 Type 2 diabetes mellitus with other specified complication: Secondary | ICD-10-CM | POA: Diagnosis not present

## 2020-09-11 DIAGNOSIS — E785 Hyperlipidemia, unspecified: Secondary | ICD-10-CM

## 2020-09-11 DIAGNOSIS — E1159 Type 2 diabetes mellitus with other circulatory complications: Secondary | ICD-10-CM | POA: Diagnosis not present

## 2020-09-11 LAB — BAYER DCA HB A1C WAIVED: HB A1C (BAYER DCA - WAIVED): 5.6 % (ref <7.0)

## 2020-09-11 NOTE — Progress Notes (Signed)
BP 136/74   Pulse (!) 59   Ht 5' 11" (1.803 m)   Wt 181 lb (82.1 kg)   SpO2 99%   BMI 25.24 kg/m    Subjective:   Patient ID: [name], male    DOB: 1929-08-07, 87 y.o.   MRN: [id number]  HPI: [name] is a 87 y.o. male presenting on 09/11/2020 for Medical Management of Chronic Issues and Diabetes   HPI Diabetes Pt coming in for recheck on diabetes. His A1c today is 5.6% and he states that sugars are running good at home. He currently take metformin. He reports occasional diarrhea but says the it resolves quickly and does not cause him any issue. He denies numbness, paraesthesia, chest pain, weakness, foot ulcers, pain, N/V or constipation.  Hypertension Pt also coming in for a recheck on hypertension. BP today is 136/74. He is currently taking metoprolol, HCTZ, and benazepril. He states he is tolerating current medications well. He does have urinary frequency at night but states this is normal and has not changed. He denies HA, visual disturbance, focal deficits, or syncope.  Asthma Pt also coming in for asthma recheck. He currently takes Advair and has a prn Albuterol inhaler. He states this is going well, he denies changes and says that taking to Advair daily keeps symptoms under control. He states he has rarely had to use his albuterol and when he does, his symptoms improve and has no further problems.  Pt is also taking Xarelto for atrial fibrillation and history of strokes. He denies any bruising, bleeding, blood in his stools or urine, HA, stroke like symptoms, or falls. He states he is happy with the Xarelto and does not see a need to change.   Relevant past medical, surgical, family and social history reviewed and updated as indicated. Interim medical history since our last visit reviewed. Allergies and medications reviewed and updated.  Review of Systems  Constitutional: Negative for appetite change, chills, fatigue and fever.  HENT: Negative.   Eyes:  Negative.   Respiratory: Negative for cough, chest tightness, shortness of breath and wheezing.   Cardiovascular: Negative for chest pain and leg swelling.  Gastrointestinal: Positive for diarrhea (occasionally but resolve fast). Negative for abdominal pain, blood in stool, constipation, nausea and vomiting.  Endocrine: Negative.   Genitourinary: Positive for frequency. Negative for decreased urine volume, difficulty urinating, dysuria, hematuria and urgency.  Musculoskeletal: Negative.   Neurological: Negative for dizziness, syncope, facial asymmetry, weakness, light-headedness, numbness and headaches.  Hematological: Does not bruise/bleed easily.  Psychiatric/Behavioral: Negative.     Per HPI unless specifically indicated above   Allergies as of 09/11/2020      Reactions   Alteplase Anaphylaxis   Codeine Other (See Comments)   Bad dreams   Lopid [gemfibrozil] Nausea Only   Voltaren [diclofenac Sodium] Diarrhea      Medication List       Accurate as of September 11, 2020 10:06 AM. If you have any questions, ask your nurse or doctor.        Accu-Chek FastClix Lancets Misc Check BS up to 4 TIMES DAILY Dx E11.9   Accu-Chek Guide test strip Generic drug: glucose blood Check BS up to 4 TIMES DAILY Dx E11.9   acetaminophen 325 MG tablet Commonly known as: TYLENOL Take 650 mg by mouth every 6 (six) hours as needed for mild pain, fever or headache.   Advair Diskus 100-50 MCG/DOSE Aepb Generic drug: Fluticasone-Salmeterol INHALE 1 DOSE BY MOUTH EVERY 12  HOURS   atorvastatin 80 MG tablet Commonly known as: LIPITOR Take 1 tablet (80 mg total) by mouth daily.   benazepril 20 MG tablet Commonly known as: LOTENSIN Take 1 tablet (20 mg total) by mouth daily.   blood glucose meter kit and supplies Kit Dispense based on patient and insurance preference. Use four times daily as directed.  Diagnosis type 2 diabetes   feeding supplement (GLUCERNA SHAKE) Liqd Take 237 mLs by mouth 2  (two) times daily between meals.   fish oil-omega-3 fatty acids 1000 MG capsule Take 2 g by mouth every evening.   hydrALAZINE 25 MG tablet Commonly known as: APRESOLINE Take 1 tablet (25 mg total) by mouth daily as needed.   hydrochlorothiazide 25 MG tablet Commonly known as: HYDRODIURIL Take 1 tablet (25 mg total) by mouth daily.   metFORMIN 500 MG 24 hr tablet Commonly known as: GLUCOPHAGE-XR Take 1 tablet (500 mg total) by mouth 2 (two) times daily.   metoprolol tartrate 100 MG tablet Commonly known as: LOPRESSOR Take 1 tablet (100 mg total) by mouth 2 (two) times daily.   nitroGLYCERIN 0.4 MG SL tablet Commonly known as: NITROSTAT USE AS DIRECTED What changed:   how much to take  how to take this  when to take this  reasons to take this   pantoprazole 40 MG tablet Commonly known as: PROTONIX Take 1 tablet (40 mg total) by mouth daily.   ProAir HFA 108 (90 Base) MCG/ACT inhaler Generic drug: albuterol INHALE 1 TO 2 PUFFS BY MOUTH EVERY 6 HOURS AS NEEDED FOR WHEEZING FOR SHORTNESS OF BREATH   rivaroxaban 20 MG Tabs tablet Commonly known as: Xarelto Take 1 tablet (20 mg total) by mouth daily with supper.   Vitamin D 50 MCG (2000 UT) Caps Take 2,000 Units by mouth daily.        Objective:   BP 136/74   Pulse (!) 59   Ht 5' 11" (1.803 m)   Wt 181 lb (82.1 kg)   SpO2 99%   BMI 25.24 kg/m   Wt Readings from Last 3 Encounters:  09/11/20 181 lb (82.1 kg)  06/06/20 188 lb (85.3 kg)  03/06/20 185 lb (83.9 kg)    Physical Exam Constitutional:      Appearance: Normal appearance.  HENT:     Head: Normocephalic.  Eyes:     Extraocular Movements: Extraocular movements intact.     Conjunctiva/sclera: Conjunctivae normal.     Pupils: Pupils are equal, round, and reactive to light.  Cardiovascular:     Rate and Rhythm: Normal rate and regular rhythm.     Pulses: Normal pulses.     Heart sounds: Murmur heard.    Pulmonary:     Effort: Pulmonary  effort is normal.     Breath sounds: Normal breath sounds.  Musculoskeletal:        General: No swelling.     Right lower leg: No edema.     Left lower leg: No edema.  Skin:    Findings: No bruising.  Neurological:     General: No focal deficit present.     Mental Status: He is alert and oriented to person, place, and time.  Psychiatric:        Mood and Affect: Mood normal.        Behavior: Behavior normal.       Assessment & Plan:   Problem List Items Addressed This Visit      Cardiovascular and Mediastinum   Hypertension associated  with diabetes ([place])   Relevant Orders   CBC with Differential/Platelet (Completed)   CMP14+EGFR (Completed)   Lipid panel (Completed)   Bayer DCA Hb A1c Waived (Completed)     Endocrine   Hyperlipidemia associated with type 2 diabetes mellitus ([place])   Relevant Orders   CBC with Differential/Platelet (Completed)   CMP14+EGFR (Completed)   Lipid panel (Completed)   Bayer DCA Hb A1c Waived (Completed)   DM type 2 (diabetes mellitus, type 2) ([place]) - Primary   Relevant Orders   CBC with Differential/Platelet (Completed)   CMP14+EGFR (Completed)   Lipid panel (Completed)   Bayer DCA Hb A1c Waived (Completed)    Other Visit Diagnoses    Need for immunization against influenza       Relevant Orders   Flu Vaccine QUAD High Dose(Fluad) (Completed)       Follow up plan: Return in about 6 months (around 03/11/2021), or if symptoms worsen or fail to improve, for Diabetes hypertension cholesterol.   We discussed cutting down to one metformin a day as his A1c and sugars have been running good, he has occasional diarrhea while on it, and his lifestyle changes are working to control his diabetes. He acknowledged and says he will try this.  Counseling provided for all of the vaccine components Orders Placed This Encounter  Procedures  . Flu Vaccine QUAD High Dose(Fluad)  . CBC with Differential/Platelet  . CMP14+EGFR  . Lipid panel  . [facility] Hb A1c Waived   Silverio Decamp 09/11/2020  I was personally present for all components of the history, physical exam and/or medical decision making.  I agree with the documentation performed by the PA student and agree with assessment and plan above.  PA student was [employer] Patient seems to be doing very well, he seems to be controlling his diabetes with diet, recommended reducing Metformin but he like to hold off on it.  We will continue to monitor and recheck the rest of his blood work.  Breathing and A. fib seem to be going well, no sign of bruising or bleeding.  Caryl Pina, MD [facility] 09/11/2020, 10:06 AM

## 2020-09-12 LAB — CMP14+EGFR
ALT: 17 IU/L (ref 0–44)
AST: 23 IU/L (ref 0–40)
Albumin/Globulin Ratio: 2 (ref 1.2–2.2)
Albumin: 4.7 g/dL — ABNORMAL HIGH (ref 3.6–4.6)
Alkaline Phosphatase: 88 IU/L (ref 44–121)
BUN/Creatinine Ratio: 11 (ref 10–24)
BUN: 13 mg/dL (ref 8–27)
Bilirubin Total: 1.2 mg/dL (ref 0.0–1.2)
CO2: 24 mmol/L (ref 20–29)
Calcium: 9.4 mg/dL (ref 8.6–10.2)
Chloride: 94 mmol/L — ABNORMAL LOW (ref 96–106)
Creatinine, Ser: 1.18 mg/dL (ref 0.76–1.27)
GFR calc Af Amer: 63 mL/min/{1.73_m2} (ref >59)
GFR calc non Af Amer: 55 mL/min/{1.73_m2} — ABNORMAL LOW (ref >59)
Globulin, Total: 2.3 g/dL (ref 1.5–4.5)
Glucose: 147 mg/dL — ABNORMAL HIGH (ref 65–99)
Potassium: 4.2 mmol/L (ref 3.5–5.2)
Sodium: 133 mmol/L — ABNORMAL LOW (ref 134–144)
Total Protein: 7 g/dL (ref 6.0–8.5)

## 2020-09-12 LAB — CBC WITH DIFFERENTIAL/PLATELET
Basophils Absolute: 0 10*3/uL (ref 0.0–0.2)
Basos: 1 %
EOS (ABSOLUTE): 0.3 10*3/uL (ref 0.0–0.4)
Eos: 4 %
Hematocrit: 38.7 % (ref 37.5–51.0)
Hemoglobin: 14.1 g/dL (ref 13.0–17.7)
Immature Grans (Abs): 0 10*3/uL (ref 0.0–0.1)
Immature Granulocytes: 0 %
Lymphocytes Absolute: 1.3 10*3/uL (ref 0.7–3.1)
Lymphs: 20 %
MCH: 35.1 pg — ABNORMAL HIGH (ref 26.6–33.0)
MCHC: 36.4 g/dL — ABNORMAL HIGH (ref 31.5–35.7)
MCV: 96 fL (ref 79–97)
Monocytes Absolute: 0.5 10*3/uL (ref 0.1–0.9)
Monocytes: 8 %
Neutrophils Absolute: 4.3 10*3/uL (ref 1.4–7.0)
Neutrophils: 67 %
Platelets: 191 10*3/uL (ref 150–450)
RBC: 4.02 x10E6/uL — ABNORMAL LOW (ref 4.14–5.80)
RDW: 11.4 % — ABNORMAL LOW (ref 11.6–15.4)
WBC: 6.4 10*3/uL (ref 3.4–10.8)

## 2020-09-12 LAB — LIPID PANEL
Chol/HDL Ratio: 2.3 ratio (ref 0.0–5.0)
Cholesterol, Total: 91 mg/dL — ABNORMAL LOW (ref 100–199)
HDL: 39 mg/dL — ABNORMAL LOW (ref >39)
LDL Chol Calc (NIH): 34 mg/dL (ref 0–99)
Triglycerides: 95 mg/dL (ref 0–149)
VLDL Cholesterol Cal: 18 mg/dL (ref 5–40)

## 2020-11-12 DIAGNOSIS — M79676 Pain in unspecified toe(s): Secondary | ICD-10-CM | POA: Diagnosis not present

## 2020-11-12 DIAGNOSIS — E1142 Type 2 diabetes mellitus with diabetic polyneuropathy: Secondary | ICD-10-CM | POA: Diagnosis not present

## 2020-11-12 DIAGNOSIS — B351 Tinea unguium: Secondary | ICD-10-CM | POA: Diagnosis not present

## 2020-11-12 DIAGNOSIS — L84 Corns and callosities: Secondary | ICD-10-CM | POA: Diagnosis not present

## 2020-11-13 ENCOUNTER — Other Ambulatory Visit: Payer: Self-pay | Admitting: Family Medicine

## 2020-11-23 ENCOUNTER — Other Ambulatory Visit: Payer: Self-pay | Admitting: Family Medicine

## 2020-12-04 DIAGNOSIS — Z23 Encounter for immunization: Secondary | ICD-10-CM | POA: Diagnosis not present

## 2020-12-21 NOTE — Progress Notes (Signed)
Cardiology Office Note   Date:  12/25/2020   ID:  Merwin, Breden September 09, 1929, MRN [id number]  PCP:  Dettinger, Elige Radon, MD  Cardiologist:   Rollene Rotunda, MD   Chief Complaint  Patient presents with  . Atrial Fibrillation      History of Present Illness: [name] is a 87 y.o. male who presents for followup of his known coronary disease.  He had a previous TIA  He had a loop recorder placed.  He did have a TEE demonstrating a small PFO. Doppler was negative for DVT.  MRI demonstrated small acute infarcts in the MCA distribution. He had an implanted loop monitor.  He was found to have atrial fib and was started on Xarelto.    Since I last saw him he has done very well.  He rides a stationary bicycle once in a while. The patient denies any new symptoms such as chest discomfort, neck or arm discomfort. There has been no new shortness of breath, PND or orthopnea. There have been no reported palpitations, presyncope or syncope.    Past Medical History:  Diagnosis Date  . Arthritis    "wrists, knees, back" (12/10/2015)  . Asthma   . BCC (basal cell carcinoma of skin) 01/28/2017   left inner cheek--sup & nod (CX3, 5FU)  . BCC (basal cell carcinoma) 01/26/2017   left chin (molts)  . BCC (basal cell carcinoma) 04/01/2017   midback--sup & nod (tx p bx)  . BCC (basal cell carcinoma) 10/13/2017   above left outer eye brow--infiltrative (CX3, 5FU)  . BCC (basal cell carcinoma) 06/02/2018   right post forearm--nod (tx p bx)  . BCC (basal cell carcinoma) 03/09/2019   right mid outer cheek--nod--(tx p bx)  . Cerebral atherosclerosis   . Chronic back pain    "lower primarily" (12/10/2015)  . CORONARY ATHEROSCLEROSIS NATIVE CORONARY ARTERY    Myocardial infarction at [facility] in 1996 with a directional atherectomy of the 90% LAD stenosis and medical management of 60-70% proximal circumflex and 50% RCA stenosis.  . DYSLIPIDEMIA   . GERD (gastroesophageal reflux disease)   .  History of hiatal hernia   . HYPERTENSION   . ILIAC ARTERY ANEURYSM   . Myocardial infarction (HCC) 1996  . SCC (squamous cell carcinoma) 10/29/2016   left ear--in situ (tx p bx)  . SCC (squamous cell carcinoma) 01/26/2017   left sideburn--well diff--(CX3, 5FU)  . SCC (squamous cell carcinoma) 01/28/2017   left upper cheek--in situ (CX3, 5FU)  . SCC (squamous cell carcinoma) 05/06/2017   left cheek--sup &well diff (tx p bx)  . SCC (squamous cell carcinoma) 10/13/2017   right outer forearm--in situ (CX3 5FU)  . SCC (squamous cell carcinoma) 10/13/2017   right ouer forarm--well diff (CX3 5FU)  . SCC (squamous cell carcinoma) 10/13/2017   left sideburn, superior--well diff (CX3, 5FU)  . SCC (squamous cell carcinoma) 06/02/2018   right forearm--in situ (tx p bx)  . SCC (squamous cell carcinoma) 03/09/2019   left zy goma--well diff (tx p bx)  . SCC (squamous cell carcinoma) 03/09/2019   right pre auricular--well diff (tx p bx)  . Stroke [facility]) 2003; 2013; 07/2015; 12/05/2015   denies residual on 12/10/2015, 12/13/16, 01/08/17  . TIA (transient ischemic attack)   . Type II diabetes mellitus (HCC)   . UPPER RESPIRATORY INFECTION     Past Surgical History:  Procedure Laterality Date  . ANAL FISSURE REPAIR  1990s  . BASAL CELL CARCINOMA EXCISION  Right 12/2015   "temple"  . CATARACT EXTRACTION W/ INTRAOCULAR LENS  IMPLANT, BILATERAL Bilateral 2000s  . CORONARY ANGIOPLASTY  1996  . KNEE CARTILAGE SURGERY Right 1970s  . LOOP RECORDER INSERTION N/A 12/15/2016   Procedure: Loop Recorder Insertion;  Surgeon: Thompson Grayer, MD;  Location: [place] [facility];  Service: Cardiovascular;  Laterality: N/A;  . SHOULDER ARTHROSCOPY W/ ROTATOR CUFF REPAIR Left 2000s  . TEE WITHOUT CARDIOVERSION N/A 12/15/2016   Procedure: TRANSESOPHAGEAL ECHOCARDIOGRAM (TEE);  Surgeon: Lelon Perla, MD;  Location: [facility] ENDOSCOPY;  Service: Cardiovascular;  Laterality: N/A;     Current Outpatient Medications   Medication Sig Dispense Refill  . Accu-Chek FastClix Lancets MISC Check BS up to 4 TIMES DAILY Dx E11.9 408 each 3  . acetaminophen (TYLENOL) 325 MG tablet Take 650 mg by mouth every 6 (six) hours as needed for mild pain, fever or headache.    . ADVAIR DISKUS 100-50 MCG/DOSE AEPB INHALE 1 DOSE BY MOUTH EVERY 12 HOURS 180 each 0  . albuterol (VENTOLIN HFA) 108 (90 Base) MCG/ACT inhaler INHALE 1 TO 2 PUFFS BY MOUTH EVERY 6 HOURS AS NEEDED FOR WHEEZING FOR SHORTNESS OF BREATH 27 g 0  . atorvastatin (LIPITOR) 80 MG tablet Take 1 tablet (80 mg total) by mouth daily. 90 tablet 3  . benazepril (LOTENSIN) 20 MG tablet Take 1 tablet (20 mg total) by mouth daily. 90 tablet 3  . blood glucose meter kit and supplies KIT Dispense based on patient and insurance preference. Use four times daily as directed.  Diagnosis type 2 diabetes 1 each 0  . Cholecalciferol (VITAMIN D) 2000 units CAPS Take 2,000 Units by mouth daily.    . feeding supplement, GLUCERNA SHAKE, (GLUCERNA SHAKE) LIQD Take 237 mLs by mouth 2 (two) times daily between meals. 60 Can 11  . fish oil-omega-3 fatty acids 1000 MG capsule Take 2 g by mouth every evening.    Marland Kitchen glucose blood (ACCU-CHEK GUIDE) test strip Check BS up to 4 TIMES DAILY Dx E11.9 400 each 3  . hydrALAZINE (APRESOLINE) 25 MG tablet Take 1 tablet (25 mg total) by mouth daily as needed. 90 tablet 3  . hydrochlorothiazide (HYDRODIURIL) 25 MG tablet Take 1 tablet (25 mg total) by mouth daily. 90 tablet 3  . metFORMIN (GLUCOPHAGE-XR) 500 MG 24 hr tablet Take 1 tablet (500 mg total) by mouth 2 (two) times daily. 180 tablet 3  . metoprolol tartrate (LOPRESSOR) 100 MG tablet Take 1 tablet (100 mg total) by mouth 2 (two) times daily. 180 tablet 3  . nitroGLYCERIN (NITROSTAT) 0.4 MG SL tablet USE AS DIRECTED (Patient taking differently: Place 0.4 mg under the tongue every 5 (five) minutes as needed for chest pain. USE AS DIRECTED) 25 tablet 1  . pantoprazole (PROTONIX) 40 MG tablet Take 1  tablet (40 mg total) by mouth daily. 90 tablet 3  . rivaroxaban (XARELTO) 20 MG TABS tablet Take 1 tablet (20 mg total) by mouth daily with supper. 90 tablet 3   No current facility-administered medications for this visit.    Allergies:   Alteplase, Codeine, Lopid [gemfibrozil], and Voltaren [diclofenac sodium]    ROS:  Please see the history of present illness.   Otherwise, review of systems are positive for knee pain.   All other systems are reviewed and negative.    PHYSICAL EXAM: VS:  BP 137/81   Pulse 71   Ht $R'5\' 11"'BV$  (1.803 m)   Wt 187 lb (84.8 kg)   BMI 26.08  kg/m  , BMI Body mass index is 26.08 kg/m. GENERAL:  Well appearing NECK:  No jugular venous distention, waveform within normal limits, carotid upstroke brisk and symmetric, no bruits, transmitted systolic murmur, no thyromegaly LUNGS:  Clear to auscultation bilaterally CHEST:  Unremarkable HEART:  PMI not displaced or sustained,S1 and S2 within normal limits, no S3, no S4, no clicks, no rubs, soft apical systolic murmur radiating at the aortic outflow tract and early peaking, no diastolic murmurs ABD:  Flat, positive bowel sounds normal in frequency in pitch, no bruits, no rebound, no guarding, no midline pulsatile mass, no hepatomegaly, no splenomegaly EXT:  2 plus pulses upper and diminished dorsalis pedis and posterior tibialis bilateral, no edema, no cyanosis no clubbing   EKG:  EKG is  ordered today. The ekg ordered today demonstrates sinus rhythm, rate 71, axis within normal limits, intervals within normal limits, no acute ST-T wave changes.  Premature ectopic complexes   Recent Labs: 09/11/2020: ALT 17; BUN 13; Creatinine, Ser 1.18; Hemoglobin 14.1; Platelets 191; Potassium 4.2; Sodium 133    Lipid Panel    Component Value Date/Time   CHOL 91 (L) 09/11/2020 0926   CHOL 132 02/02/2013 0853   TRIG 95 09/11/2020 0926   TRIG 123 02/08/2015 1009   TRIG 172 (H) 02/02/2013 0853   HDL 39 (L) 09/11/2020 0926   HDL  41 02/08/2015 1009   HDL 33 (L) 02/02/2013 0853   CHOLHDL 2.3 09/11/2020 0926   CHOLHDL 2.7 04/22/2017 0158   VLDL 12 04/22/2017 0158   LDLCALC 34 09/11/2020 0926   LDLCALC 49 05/02/2014 0934   LDLCALC 65 02/02/2013 0853      Wt Readings from Last 3 Encounters:  12/25/20 187 lb (84.8 kg)  09/11/20 181 lb (82.1 kg)  06/06/20 188 lb (85.3 kg)      Other studies Reviewed: Additional studies/ records that were reviewed today include: Labs. Review of the above records demonstrates:  Please see elsewhere in the note.     ASSESSMENT AND PLAN:   TIA: He is on Xarelto.    I do note that he was in the ER in July of last year with some transient problems speaking but there were no acute findings on CT.  Continue current therapy.   ATRIAL FIB:  [name] has a CHA2DS2 - VASc score of 6.    No change in therapy.  Tolerating anticoagulation and is up-to-date with labs.  He is on the appropriate dose.  PFO:  This is an incidental finding.     CAD:   The patient has no new sypmtoms.  No further cardiovascular testing is indicated.  We will continue with aggressive risk reduction and meds as listed.  HTN:   The blood pressure is well controlled.  No change in therapy.    HYPERLIPIDEMIA:    LDL is 34 with an HDL of 39.  No change in therapy.   CAROTID STENOSIS:    This was mild.  No change in therapy.   DM:   His A1C was 5.6 down from 6.0.  No change in therapy.     Current medicines are reviewed at length with the patient today.  The patient does not have concerns regarding medicines.  The following changes have been made:  None  Labs/ tests ordered today include: None  Orders Placed This Encounter  Procedures  . EKG 12-Lead     Disposition:   FU with me in 12 months.  Signed, Minus Breeding, MD  12/25/2020 10:08 AM    [facility]

## 2020-12-25 ENCOUNTER — Ambulatory Visit: Payer: Medicare Other | Admitting: Cardiology

## 2020-12-25 ENCOUNTER — Other Ambulatory Visit: Payer: Self-pay

## 2020-12-25 ENCOUNTER — Encounter: Payer: Self-pay | Admitting: Cardiology

## 2020-12-25 VITALS — BP 137/81 | HR 71 | Ht 71.0 in | Wt 187.0 lb

## 2020-12-25 DIAGNOSIS — E785 Hyperlipidemia, unspecified: Secondary | ICD-10-CM | POA: Diagnosis not present

## 2020-12-25 DIAGNOSIS — I10 Essential (primary) hypertension: Secondary | ICD-10-CM | POA: Diagnosis not present

## 2020-12-25 DIAGNOSIS — I480 Paroxysmal atrial fibrillation: Secondary | ICD-10-CM

## 2020-12-25 DIAGNOSIS — I2510 Atherosclerotic heart disease of native coronary artery without angina pectoris: Secondary | ICD-10-CM

## 2020-12-25 NOTE — Patient Instructions (Signed)
Medication Instructions:  The current medical regimen is effective;  continue present plan and medications.  *If you need a refill on your cardiac medications before your next appointment, please call your pharmacy*  Follow-Up: At [facility], you and your health needs are our priority.  As part of our continuing mission to provide you with exceptional heart care, we have created designated Provider Care Teams.  These Care Teams include your primary Cardiologist (physician) and Advanced Practice Providers (APPs -  Physician Assistants and Nurse Practitioners) who all work together to provide you with the care you need, when you need it.  We recommend signing up for the patient portal called "MyChart".  Sign up information is provided on this After Visit Summary.  MyChart is used to connect with patients for Virtual Visits (Telemedicine).  Patients are able to view lab/test results, encounter notes, upcoming appointments, etc.  Non-urgent messages can be sent to your provider as well.   To learn more about what you can do with MyChart, go to [email or web address].    Your next appointment:   1 year(s)  The format for your next appointment:   In Person  Provider:   James Hochrein, MD   Thank you for choosing [facility]!!    

## 2021-01-02 ENCOUNTER — Ambulatory Visit (INDEPENDENT_AMBULATORY_CARE_PROVIDER_SITE_OTHER): Payer: Medicare Other

## 2021-01-02 VITALS — Ht 71.0 in | Wt 186.0 lb

## 2021-01-02 DIAGNOSIS — Z0000 Encounter for general adult medical examination without abnormal findings: Secondary | ICD-10-CM | POA: Diagnosis not present

## 2021-01-02 NOTE — Progress Notes (Signed)
Subjective:   [name] is a 87 y.o. male who presents for Medicare Annual/Subsequent preventive examination.  Virtual Visit via Telephone Note  I connected with  [name] on 01/02/21 at  8:15 AM EDT by telephone and verified that I am speaking with the correct person using two identifiers.  Location: Patient: Home Provider: [facility] Persons participating in the virtual visit: patient, wife, [place] Advisor   I discussed the limitations, risks, security and privacy concerns of performing an evaluation and management service by telephone and the availability of in person appointments. The patient expressed understanding and agreed to proceed.  Interactive audio and video telecommunications were attempted between this nurse and patient, however failed, due to patient having technical difficulties OR patient did not have access to video capability.  We continued and completed visit with audio only.  Some vital signs may be absent or patient reported.   Corey Laski E Makayli Bracken, LPN   Review of Systems     Cardiac Risk Factors include: advanced age (>25mn, >>29women);diabetes mellitus;dyslipidemia;hypertension;male gender;sedentary lifestyle     Objective:    Today's Vitals   01/02/21 0820  Weight: 186 lb (84.4 kg)  Height: _0  (1.803 m)   Body mass index is 25.94 kg/m.  Advanced Directives 01/02/2021 12/11/2019 06/27/2019 12/02/2018 11/08/2017 04/21/2017 01/08/2017  Does Patient Have a Medical Advance Directive? _1  No No  Would patient like information on creating a medical advance directive? No - Patient declined No - Patient declined - Yes (MAU/Ambulatory/Procedural Areas - Information given) - No - Patient declined No - Patient declined    Current Medications (verified) Outpatient Encounter Medications as of 01/02/2021  Medication Sig  . Accu-Chek FastClix Lancets MISC Check BS up to 4 TIMES DAILY Dx E11.9  . acetaminophen (TYLENOL) 325 MG tablet Take 650 mg  by mouth every 6 (six) hours as needed for mild pain, fever or headache.  . ADVAIR DISKUS 100-50 MCG/DOSE AEPB INHALE 1 DOSE BY MOUTH EVERY 12 HOURS  . albuterol (VENTOLIN HFA) 108 (90 Base) MCG/ACT inhaler INHALE 1 TO 2 PUFFS BY MOUTH EVERY 6 HOURS AS NEEDED FOR WHEEZING FOR SHORTNESS OF BREATH  . atorvastatin (LIPITOR) 80 MG tablet Take 1 tablet (80 mg total) by mouth daily.  . benazepril (LOTENSIN) 20 MG tablet Take 1 tablet (20 mg total) by mouth daily.  . blood glucose meter kit and supplies KIT Dispense based on patient and insurance preference. Use four times daily as directed.  Diagnosis type 2 diabetes  . Cholecalciferol (VITAMIN D) 2000 units CAPS Take 2,000 Units by mouth daily.  . feeding supplement, GLUCERNA SHAKE, (GLUCERNA SHAKE) LIQD Take 237 mLs by mouth 2 (two) times daily between meals.  . fish oil-omega-3 fatty acids 1000 MG capsule Take 2 g by mouth every evening.  .Marland Kitchenglucose blood (ACCU-CHEK GUIDE) test strip Check BS up to 4 TIMES DAILY Dx E11.9  . hydrALAZINE (APRESOLINE) 25 MG tablet Take 1 tablet (25 mg total) by mouth daily as needed.  . hydrochlorothiazide (HYDRODIURIL) 25 MG tablet Take 1 tablet (25 mg total) by mouth daily.  . metFORMIN (GLUCOPHAGE-XR) 500 MG 24 hr tablet Take 1 tablet (500 mg total) by mouth 2 (two) times daily.  . metoprolol tartrate (LOPRESSOR) 100 MG tablet Take 1 tablet (100 mg total) by mouth 2 (two) times daily.  . nitroGLYCERIN (NITROSTAT) 0.4 MG SL tablet USE AS DIRECTED (Patient taking differently: Place 0.4 mg under the tongue every 5 (five) minutes  as needed for chest pain. USE AS DIRECTED)  . pantoprazole (PROTONIX) 40 MG tablet Take 1 tablet (40 mg total) by mouth daily.  . rivaroxaban (XARELTO) 20 MG TABS tablet Take 1 tablet (20 mg total) by mouth daily with supper.   No facility-administered encounter medications on file as of 01/02/2021.    Allergies (verified) Alteplase, Codeine, Lopid [gemfibrozil], and Voltaren [diclofenac sodium]    History: Past Medical History:  Diagnosis Date  . Arthritis    "wrists, knees, back" (12/10/2015)  . Asthma   . BCC (basal cell carcinoma of skin) 01/28/2017   left inner cheek--sup & nod (CX3, 5FU)  . BCC (basal cell carcinoma) 01/26/2017   left chin (molts)  . BCC (basal cell carcinoma) 04/01/2017   midback--sup & nod (tx p bx)  . BCC (basal cell carcinoma) 10/13/2017   above left outer eye brow--infiltrative (CX3, 5FU)  . BCC (basal cell carcinoma) 06/02/2018   right post forearm--nod (tx p bx)  . BCC (basal cell carcinoma) 03/09/2019   right mid outer cheek--nod--(tx p bx)  . Cerebral atherosclerosis   . Chronic back pain    "lower primarily" (12/10/2015)  . CORONARY ATHEROSCLEROSIS NATIVE CORONARY ARTERY    Myocardial infarction at [facility] in 1996 with a directional atherectomy of the 90% LAD stenosis and medical management of 60-70% proximal circumflex and 50% RCA stenosis.  . DYSLIPIDEMIA   . GERD (gastroesophageal reflux disease)   . History of hiatal hernia   . HYPERTENSION   . ILIAC ARTERY ANEURYSM   . Myocardial infarction ([place]) 1996  . SCC (squamous cell carcinoma) 10/29/2016   left ear--in situ (tx p bx)  . SCC (squamous cell carcinoma) 01/26/2017   left sideburn--well diff--(CX3, 5FU)  . SCC (squamous cell carcinoma) 01/28/2017   left upper cheek--in situ (CX3, 5FU)  . SCC (squamous cell carcinoma) 05/06/2017   left cheek--sup &well diff (tx p bx)  . SCC (squamous cell carcinoma) 10/13/2017   right outer forearm--in situ (CX3 5FU)  . SCC (squamous cell carcinoma) 10/13/2017   right ouer forarm--well diff (CX3 5FU)  . SCC (squamous cell carcinoma) 10/13/2017   left sideburn, superior--well diff (CX3, 5FU)  . SCC (squamous cell carcinoma) 06/02/2018   right forearm--in situ (tx p bx)  . SCC (squamous cell carcinoma) 03/09/2019   left zy goma--well diff (tx p bx)  . SCC (squamous cell carcinoma) 03/09/2019   right pre auricular--well diff (tx p bx)  .  Stroke [facility]) 2003; 2013; 07/2015; 12/05/2015   denies residual on 12/10/2015, 12/13/16, 01/08/17  . TIA (transient ischemic attack)   . Type II diabetes mellitus ([place])   . UPPER RESPIRATORY INFECTION    Past Surgical History:  Procedure Laterality Date  . ANAL FISSURE REPAIR  1990s  . BASAL CELL CARCINOMA EXCISION Right 12/2015   "temple"  . CATARACT EXTRACTION W/ INTRAOCULAR LENS  IMPLANT, BILATERAL Bilateral 2000s  . CORONARY ANGIOPLASTY  1996  . KNEE CARTILAGE SURGERY Right 1970s  . LOOP RECORDER INSERTION N/A 12/15/2016   Procedure: Loop Recorder Insertion;  Surgeon: Thompson Grayer, MD;  Location: [place] [facility];  Service: Cardiovascular;  Laterality: N/A;  . SHOULDER ARTHROSCOPY W/ ROTATOR CUFF REPAIR Left 2000s  . TEE WITHOUT CARDIOVERSION N/A 12/15/2016   Procedure: TRANSESOPHAGEAL ECHOCARDIOGRAM (TEE);  Surgeon: Lelon Perla, MD;  Location: [facility] ENDOSCOPY;  Service: Cardiovascular;  Laterality: N/A;   Family History  Problem Relation Age of Onset  . Coronary artery disease Mother   . Cancer Father  lung  . Stroke Father   . Cancer Brother        lung  . Stroke Sister    Social History   Socioeconomic History  . Marital status: Married    Spouse name: Vanita Ingles  . Number of children: 3  . Years of education: Not on file  . Highest education level: 7th grade  Occupational History  . Occupation: Retired    Comment: [employer]  . Smoking status: Former Smoker    Packs/day: 0.12    Years: 45.00    Pack years: 5.40    Types: Cigarettes    Quit date: 11/24/1984    Years since quitting: 36.1  . Smokeless tobacco: Former Systems developer    Types: Snuff  . Tobacco comment: "dipped snuff when I was a boy"  Vaping Use  . Vaping Use: Never used  Substance and Sexual Activity  . Alcohol use: No    Alcohol/week: 0.0 standard drinks  . Drug use: No  . Sexual activity: Not Currently  Other Topics Concern  . Not on file  Social History Narrative   One  level living home with wife, Vanita Ingles - she cooks every Sunday for the family of about 70 - children to great grandchildren   Social Determinants of Health   Financial Resource Strain: Low Risk   . Difficulty of Paying Living Expenses: Not hard at all  Food Insecurity: No Food Insecurity  . Worried About Charity fundraiser in the Last Year: Never true  . Ran Out of Food in the Last Year: Never true  Transportation Needs: No Transportation Needs  . Lack of Transportation (Medical): No  . Lack of Transportation (Non-Medical): No  Physical Activity: Insufficiently Active  . Days of Exercise per Week: 3 days  . Minutes of Exercise per Session: 20 min  Stress: No Stress Concern Present  . Feeling of Stress : Not at all  Social Connections: Socially Integrated  . Frequency of Communication with Friends and Family: More than three times a week  . Frequency of Social Gatherings with Friends and Family: More than three times a week  . Attends Religious Services: More than 4 times per year  . Active Member of Clubs or Organizations: Yes  . Attends Archivist Meetings: More than 4 times per year  . Marital Status: Married    Tobacco Counseling Counseling given: Not Answered Comment: "dipped snuff when I was a boy"   Clinical Intake:  Pre-visit preparation completed: Yes  Pain : No/denies pain     BMI - recorded: 25.94 Nutritional Status: BMI 25 -29 Overweight Nutritional Risks: None Diabetes: Yes CBG done?: No Did pt. bring in CBG monitor from home?: No  How often do you need to have someone help you when you read instructions, pamphlets, or other written materials from your doctor or pharmacy?: 1 - Never  Nutrition Risk Assessment:  Has the patient had any N/V/D within the last 2 months?  No  Does the patient have any non-healing wounds?  No  Has the patient had any unintentional weight loss or weight gain?  No   Diabetes:  Is the patient diabetic?  Yes  If  diabetic, was a CBG obtained today?  No  Did the patient bring in their glucometer from home?  No  How often do you monitor your CBG's? Once daily fasting.   Financial Strains and Diabetes Management:  Are you having any financial strains with the device, your  supplies or your medication? No .  Does the patient want to be seen by Chronic Care Management for management of their diabetes?  No  Would the patient like to be referred to a Nutritionist or for Diabetic Management?  No   Diabetic Exams:  Diabetic Eye Exam: Completed 06/17/2020.   Diabetic Foot Exam: Completed 09/11/20. Pt has been advised about the importance in completing this exam. Pt is scheduled for diabetic foot exam on 03/10/21.    Interpreter Needed?: No  Information entered by :: Julieanne Hadsall, LPN   Activities of Daily Living In your present state of health, do you have any difficulty performing the following activities: 01/02/2021  Hearing? Y  Vision? N  Difficulty concentrating or making decisions? N  Walking or climbing stairs? Y  Dressing or bathing? N  Doing errands, shopping? N  Preparing Food and eating ? N  Using the Toilet? N  In the past six months, have you accidently leaked urine? N  Do you have problems with loss of bowel control? N  Managing your Medications? N  Managing your Finances? N  Housekeeping or managing your Housekeeping? N  Some recent data might be hidden    Patient Care Team: Dettinger, Fransisca Kaufmann, MD as PCP - General (Family Medicine) Minus Breeding, MD as PCP - Cardiology (Cardiology) Lavonna Monarch, MD as Consulting Physician (Dermatology) Wendie Chess, Chrystie Nose, MD (Otolaryngology) Dennie Bible, NP as Nurse Practitioner (Neurology) Minus Breeding, MD as Consulting Physician (Cardiology) Ilean China, RN as Registered Nurse  Indicate any recent Medical Services you may have received from other than [facility] providers in the past year (date may be approximate).      Assessment:   This is a routine wellness examination for [place].  Hearing/Vision screen  Hearing Screening   _0  _1  _2  _3  _4  _5  _6  _7  _8   Right ear:           Left ear:           Comments: C/o moderate hearing loss - wears hearing aids he go from [place] Comments: Wears eyeglasses - up to date with annual eye exam with Dr Marin Comment in [place]  Dietary issues and exercise activities discussed: Current Exercise Habits: Home exercise routine, Type of exercise: walking;Other - see comments (exercise bike), Time (Minutes): 20, Frequency (Times/Week): 3, Weekly Exercise (Minutes/Week): 60, Intensity: Mild, Exercise limited by: orthopedic condition(s);cardiac condition(s);respiratory conditions(s)  Goals Addressed            This Visit's Progress   . Patient Stated   On track    12/11/2019 AWV Goal: Fall Prevention  . Over the next year, patient will decrease their risk for falls by: o Using assistive devices, such as a cane or walker, as needed o Identifying fall risks within their home and correcting them by: - Removing throw rugs - Adding handrails to stairs or ramps - Removing clutter and keeping a clear pathway throughout the home - Increasing light, especially at night - Adding shower handles/bars - Raising toilet seat o Identifying potential personal risk factors for falls: - Medication side effects - Incontinence/urgency - Vestibular dysfunction - Hearing loss - Musculoskeletal disorders - Neurological disorders - Orthostatic hypotension        Depression Screen PHQ 2/9 Scores 01/02/2021 09/11/2020 06/06/2020 03/06/2020 12/11/2019 11/23/2019 08/10/2019  PHQ - 2 Score 0 0 0 0 0 0 0  PHQ- 9 Score - - - - - - -    Fall Risk Fall  Risk  01/02/2021 09/11/2020 06/06/2020 03/06/2020 12/11/2019  Falls in the past year? 0 0 0 1 1  Number falls in past yr: 0 - 0 0 0  Injury with Fall? 0 - 0 1 1  Comment - - - - facial injuries  Risk for fall  due to : Impaired balance/gait;Orthopedic patient;Impaired vision - - Impaired balance/gait History of fall(s);Impaired mobility;Orthopedic patient  Follow up Education provided;Falls prevention discussed - Falls evaluation completed Falls evaluation completed Falls evaluation completed    FALL RISK PREVENTION PERTAINING TO THE HOME:  Any stairs in or around the home? No  If so, are there any without handrails? No  Home free of loose throw rugs in walkways, pet beds, electrical cords, etc? Yes  Adequate lighting in your home to reduce risk of falls? Yes   ASSISTIVE DEVICES UTILIZED TO PREVENT FALLS:  Life alert? No  Use of a cane, walker or w/c? Yes  Grab bars in the bathroom? No  Shower chair or bench in shower? Yes  Elevated toilet seat or a handicapped toilet? No   TIMED UP AND GO:  Was the test performed? No . Telephonic visit.  Cognitive Function: MMSE - Mini Mental State Exam 11/08/2017 04/22/2015  Orientation to time 5 5  Orientation to Place 5 5  Registration 3 3  Attention/ Calculation 3 3  Recall 0 2  Language- name 2 objects 2 2  Language- repeat 1 1  Language- follow 3 step command 3 3  Language- read & follow direction 1 1  Write a sentence 1 1  Copy design 0 0  Total score 24 26     6CIT Screen 12/11/2019 12/02/2018  What Year? 0 points 0 points  What month? 0 points 3 points  What time? 0 points 0 points  Count back from 20 0 points 2 points  Months in reverse 0 points 0 points  Repeat phrase 4 points 0 points  Total Score 4 5    Immunizations Immunization History  Administered Date(s) Administered  . Fluad Quad(high Dose 65+) 05/10/2019, 09/11/2020  . Influenza Inj Mdck Quad Pf 05/10/2019  . Influenza, High Dose Seasonal PF 06/10/2016, 07/20/2018  . Influenza,inj,Quad PF,6+ Mos 08/15/2013, 05/02/2014, 05/23/2015, 05/18/2017  . Moderna Sars-Covid-2 Vaccination 08/16/2019, 09/13/2019, 06/05/2020  . Pneumococcal Conjugate-13 08/15/2013  . Pneumococcal  Polysaccharide-23 04/22/2015  . Td 04/16/2011  . Tdap 11/22/2010, 04/13/2011  . Zoster, Live 09/13/2013    TDAP status: Up to date  Flu Vaccine status: Up to date  Pneumococcal vaccine status: Up to date  Covid-19 vaccine status: Completed vaccines  Qualifies for Shingles Vaccine? Yes   Zostavax completed Yes   Shingrix Completed?: No.    Education has been provided regarding the importance of this vaccine. Patient has been advised to call insurance company to determine out of pocket expense if they have not yet received this vaccine. Advised may also receive vaccine at local pharmacy or Health Dept. Verbalized acceptance and understanding.  Screening Tests Health Maintenance  Topic Date Due  . Zoster Vaccines- Shingrix (1 of 2) Never done  . COVID-19 Vaccine (4 - Booster for Moderna series) 09/05/2020  . INFLUENZA VACCINE  03/03/2021  . HEMOGLOBIN A1C  03/11/2021  . TETANUS/TDAP  04/15/2021  . OPHTHALMOLOGY EXAM  06/17/2021  . FOOT EXAM  09/11/2021  . PNA vac Low Risk Adult  Completed  . HPV VACCINES  Aged Out    Health Maintenance  Health Maintenance Due  Topic Date Due  . Zoster Vaccines-  Shingrix (1 of 2) Never done  . COVID-19 Vaccine (4 - Booster for Moderna series) 09/05/2020    Colorectal cancer screening: No longer required.   Lung Cancer Screening: (Low Dose CT Chest recommended if Age 87-80 years, 30 pack-year currently smoking OR have quit w/in 15years.) does not qualify.   Additional Screening:  Hepatitis C Screening: does not qualify  Vision Screening: Recommended annual ophthalmology exams for early detection of glaucoma and other disorders of the eye. Is the patient up to date with their annual eye exam?  Yes  Who is the provider or what is the name of the office in which the patient attends annual eye exams? Dr Marin Comment in [place] If pt is not established with a provider, would they like to be referred to a provider to establish care? No .   Dental  Screening: Recommended annual dental exams for proper oral hygiene  Community Resource Referral / Chronic Care Management: CRR required this visit?  No   CCM required this visit?  No      Plan:     I have personally reviewed and noted the following in the patient's chart:   . Medical and social history . Use of alcohol, tobacco or illicit drugs  . Current medications and supplements including opioid prescriptions. Patient is not currently taking opioid prescriptions. . Functional ability and status . Nutritional status . Physical activity . Advanced directives . List of other physicians . Hospitalizations, surgeries, and ER visits in previous 12 months . Vitals . Screenings to include cognitive, depression, and falls . Referrals and appointments  In addition, I have reviewed and discussed with patient certain preventive protocols, quality metrics, and best practice recommendations. A written personalized care plan for preventive services as well as general preventive health recommendations were provided to patient.     Sandrea Hammond, LPN   11/10/8262   Nurse Notes: None

## 2021-01-02 NOTE — Patient Instructions (Signed)
[name] , Thank you for taking time to come for your Medicare Wellness Visit. I appreciate your ongoing commitment to your health goals. Please review the following plan we discussed and let me know if I can assist you in the future.   Screening recommendations/referrals: Colonoscopy: Done 01/31/2013 - Repeat not required Recommended yearly ophthalmology/optometry visit for glaucoma screening and checkup Recommended yearly dental visit for hygiene and checkup  Vaccinations: Influenza vaccine: Done 09/11/20 - Repeat in fall Pneumococcal vaccine: Done 08/15/2013 * 04/22/2015 Tdap vaccine: Done 04/16/2011 - Repeat every 10 years (due this year) Shingles vaccine: Zostavax one 09/13/2013 - Due for Shingrix discussed. Please contact your pharmacy for coverage information.    Covid-19: Done 08/16/19, 09/13/19, & 06/05/2020  Advanced directives: Advance directive discussed with you today. Even though you declined this today, please call our office should you change your mind, and we can give you the proper paperwork for you to fill out.  Conditions/risks identified: Aim for 30 minutes of exercise or brisk walking each day, drink 6-8 glasses of water and eat lots of fruits and vegetables.  Next appointment: Follow up in one year for your annual wellness visit.   Preventive Care 33 Years and Older, Male  Preventive care refers to lifestyle choices and visits with your health care provider that can promote health and wellness. What does preventive care include?  A yearly physical exam. This is also called an annual well check.  Dental exams once or twice a year.  Routine eye exams. Ask your health care provider how often you should have your eyes checked.  Personal lifestyle choices, including:  Daily care of your teeth and gums.  Regular physical activity.  Eating a healthy diet.  Avoiding tobacco and drug use.  Limiting alcohol use.  Practicing safe sex.  Taking low doses of aspirin  every day.  Taking vitamin and mineral supplements as recommended by your health care provider. What happens during an annual well check? The services and screenings done by your health care provider during your annual well check will depend on your age, overall health, lifestyle risk factors, and family history of disease. Counseling  Your health care provider may ask you questions about your:  Alcohol use.  Tobacco use.  Drug use.  Emotional well-being.  Home and relationship well-being.  Sexual activity.  Eating habits.  History of falls.  Memory and ability to understand (cognition).  Work and work Statistician. Screening  You may have the following tests or measurements:  Height, weight, and BMI.  Blood pressure.  Lipid and cholesterol levels. These may be checked every 5 years, or more frequently if you are over 64 years old.  Skin check.  Lung cancer screening. You may have this screening every year starting at age 72 if you have a 30-pack-year history of smoking and currently smoke or have quit within the past 15 years.  Fecal occult blood test (FOBT) of the stool. You may have this test every year starting at age 29.  Flexible sigmoidoscopy or colonoscopy. You may have a sigmoidoscopy every 5 years or a colonoscopy every 10 years starting at age 22.  Prostate cancer screening. Recommendations will vary depending on your family history and other risks.  Hepatitis C blood test.  Hepatitis B blood test.  Sexually transmitted disease (STD) testing.  Diabetes screening. This is done by checking your blood sugar (glucose) after you have not eaten for a while (fasting). You may have this done every 1-3 years.  Abdominal aortic aneurysm (AAA) screening. You may need this if you are a current or former smoker.  Osteoporosis. You may be screened starting at age 81 if you are at high risk. Talk with your health care provider about your test results, treatment  options, and if necessary, the need for more tests. Vaccines  Your health care provider may recommend certain vaccines, such as:  Influenza vaccine. This is recommended every year.  Tetanus, diphtheria, and acellular pertussis (Tdap, Td) vaccine. You may need a Td booster every 10 years.  Zoster vaccine. You may need this after age 82.  Pneumococcal 13-valent conjugate (PCV13) vaccine. One dose is recommended after age 72.  Pneumococcal polysaccharide (PPSV23) vaccine. One dose is recommended after age 37. Talk to your health care provider about which screenings and vaccines you need and how often you need them. This information is not intended to replace advice given to you by your health care provider. Make sure you discuss any questions you have with your health care provider. Document Released: 08/16/2015 Document Revised: 04/08/2016 Document Reviewed: 05/21/2015 Elsevier Interactive Patient Education  2017 [place] Prevention in the Home Falls can cause injuries. They can happen to people of all ages. There are many things you can do to make your home safe and to help prevent falls. What can I do on the outside of my home?  Regularly fix the edges of walkways and driveways and fix any cracks.  Remove anything that might make you trip as you walk through a door, such as a raised step or threshold.  Trim any bushes or trees on the path to your home.  Use bright outdoor lighting.  Clear any walking paths of anything that might make someone trip, such as rocks or tools.  Regularly check to see if handrails are loose or broken. Make sure that both sides of any steps have handrails.  Any raised decks and porches should have guardrails on the edges.  Have any leaves, snow, or ice cleared regularly.  Use sand or salt on walking paths during winter.  Clean up any spills in your garage right away. This includes oil or grease spills. What can I do in the  bathroom?  Use night lights.  Install grab bars by the toilet and in the tub and shower. Do not use towel bars as grab bars.  Use non-skid mats or decals in the tub or shower.  If you need to sit down in the shower, use a plastic, non-slip stool.  Keep the floor dry. Clean up any water that spills on the floor as soon as it happens.  Remove soap buildup in the tub or shower regularly.  Attach bath mats securely with double-sided non-slip rug tape.  Do not have throw rugs and other things on the floor that can make you trip. What can I do in the bedroom?  Use night lights.  Make sure that you have a light by your bed that is easy to reach.  Do not use any sheets or blankets that are too big for your bed. They should not hang down onto the floor.  Have a firm chair that has side arms. You can use this for support while you get dressed.  Do not have throw rugs and other things on the floor that can make you trip. What can I do in the kitchen?  Clean up any spills right away.  Avoid walking on wet floors.  Keep items that you use  a lot in easy-to-reach places.  If you need to reach something above you, use a strong step stool that has a grab bar.  Keep electrical cords out of the way.  Do not use floor polish or wax that makes floors slippery. If you must use wax, use non-skid floor wax.  Do not have throw rugs and other things on the floor that can make you trip. What can I do with my stairs?  Do not leave any items on the stairs.  Make sure that there are handrails on both sides of the stairs and use them. Fix handrails that are broken or loose. Make sure that handrails are as long as the stairways.  Check any carpeting to make sure that it is firmly attached to the stairs. Fix any carpet that is loose or worn.  Avoid having throw rugs at the top or bottom of the stairs. If you do have throw rugs, attach them to the floor with carpet tape.  Make sure that you have a  light switch at the top of the stairs and the bottom of the stairs. If you do not have them, ask someone to add them for you. What else can I do to help prevent falls?  Wear shoes that:  Do not have high heels.  Have rubber bottoms.  Are comfortable and fit you well.  Are closed at the toe. Do not wear sandals.  If you use a stepladder:  Make sure that it is fully opened. Do not climb a closed stepladder.  Make sure that both sides of the stepladder are locked into place.  Ask someone to hold it for you, if possible.  Clearly mark and make sure that you can see:  Any grab bars or handrails.  First and last steps.  Where the edge of each step is.  Use tools that help you move around (mobility aids) if they are needed. These include:  Canes.  Walkers.  Scooters.  Crutches.  Turn on the lights when you go into a dark area. Replace any light bulbs as soon as they burn out.  Set up your furniture so you have a clear path. Avoid moving your furniture around.  If any of your floors are uneven, fix them.  If there are any pets around you, be aware of where they are.  Review your medicines with your doctor. Some medicines can make you feel dizzy. This can increase your chance of falling. Ask your doctor what other things that you can do to help prevent falls. This information is not intended to replace advice given to you by your health care provider. Make sure you discuss any questions you have with your health care provider. Document Released: 05/16/2009 Document Revised: 12/26/2015 Document Reviewed: 08/24/2014 Elsevier Interactive Patient Education  2017 [employer].

## 2021-01-04 DIAGNOSIS — H40033 Anatomical narrow angle, bilateral: Secondary | ICD-10-CM | POA: Diagnosis not present

## 2021-01-04 DIAGNOSIS — E119 Type 2 diabetes mellitus without complications: Secondary | ICD-10-CM | POA: Diagnosis not present

## 2021-01-08 ENCOUNTER — Other Ambulatory Visit: Payer: Self-pay | Admitting: Family Medicine

## 2021-01-20 ENCOUNTER — Ambulatory Visit: Payer: Medicare Other | Admitting: Dermatology

## 2021-01-20 ENCOUNTER — Other Ambulatory Visit: Payer: Self-pay | Admitting: Dermatology

## 2021-01-20 ENCOUNTER — Other Ambulatory Visit: Payer: Self-pay

## 2021-01-20 DIAGNOSIS — C44329 Squamous cell carcinoma of skin of other parts of face: Secondary | ICD-10-CM

## 2021-01-20 DIAGNOSIS — Z1283 Encounter for screening for malignant neoplasm of skin: Secondary | ICD-10-CM

## 2021-01-20 DIAGNOSIS — D485 Neoplasm of uncertain behavior of skin: Secondary | ICD-10-CM

## 2021-01-20 DIAGNOSIS — C44319 Basal cell carcinoma of skin of other parts of face: Secondary | ICD-10-CM

## 2021-01-20 NOTE — Patient Instructions (Signed)

## 2021-01-21 DIAGNOSIS — B351 Tinea unguium: Secondary | ICD-10-CM | POA: Diagnosis not present

## 2021-01-21 DIAGNOSIS — L84 Corns and callosities: Secondary | ICD-10-CM | POA: Diagnosis not present

## 2021-01-21 DIAGNOSIS — E1142 Type 2 diabetes mellitus with diabetic polyneuropathy: Secondary | ICD-10-CM | POA: Diagnosis not present

## 2021-01-21 DIAGNOSIS — M79676 Pain in unspecified toe(s): Secondary | ICD-10-CM | POA: Diagnosis not present

## 2021-01-25 ENCOUNTER — Encounter: Payer: Self-pay | Admitting: Dermatology

## 2021-01-25 NOTE — Progress Notes (Signed)
   Follow-Up Visit   Subjective  [name] is a 87 y.o. male who presents for the following: Follow-up (On [place] left ear. Also patient has new lesion above left brow. Does bleed x months. ).  Several new lesions, the worst being above the left outer eyebrow Location:  Duration:  Quality:  Associated Signs/Symptoms: Modifying Factors:  Severity:  Timing: Context:   Objective  Well appearing patient in no apparent distress; mood and affect are within normal limits. Waist up skin examination: Although there is no definite recurrent skin cancer and no atypical pigmented lesion, Mr. Colaizzi has truly diffuse severe UV damage.  Left Eyebrow medial 10.5 to upper ear junction: 1 cm nodule with hemorrhagic crust     Left Temple lateral 9.5 to upper ear junction: 8 mm pearly nodule with central erosion     Right Parotid Area 1.2 cm ill-defined waxy crust       All skin waist up examined.   Assessment & Plan    Neoplasm of uncertain behavior of skin (3) Left Eyebrow medial  Skin / nail biopsy Type of biopsy: tangential   Informed consent: discussed and consent obtained   Timeout: patient name, date of birth, surgical site, and procedure verified   Anesthesia: the lesion was anesthetized in a standard fashion   Anesthetic:  1% lidocaine w/ epinephrine 1-100,000 local infiltration Instrument used: flexible razor blade   Hemostasis achieved with: ferric subsulfate   Outcome: patient tolerated procedure well   Post-procedure details: wound care instructions given    Specimen 1 - Surgical pathology Differential Diagnosis: scc vs bcc  Check Margins: No  Left Temple lateral  Skin / nail biopsy Type of biopsy: tangential   Informed consent: discussed and consent obtained   Timeout: patient name, date of birth, surgical site, and procedure verified   Anesthesia: the lesion was anesthetized in a standard fashion   Anesthetic:  1% lidocaine w/ epinephrine  1-100,000 local infiltration Instrument used: flexible razor blade   Hemostasis achieved with: ferric subsulfate   Outcome: patient tolerated procedure well   Post-procedure details: wound care instructions given    Specimen 2 - Surgical pathology Differential Diagnosis: scc vs bcc  Check Margins: No  Right Parotid Area  Skin / nail biopsy Type of biopsy: tangential   Informed consent: discussed and consent obtained   Timeout: patient name, date of birth, surgical site, and procedure verified   Anesthesia: the lesion was anesthetized in a standard fashion   Anesthetic:  1% lidocaine w/ epinephrine 1-100,000 local infiltration Instrument used: flexible razor blade   Hemostasis achieved with: ferric subsulfate   Outcome: patient tolerated procedure well   Post-procedure details: wound care instructions given    Specimen 3 - Surgical pathology Differential Diagnosis: scc vs bcc  Check Margins: No  Screening for malignant neoplasm of skin  Yearly skin exams.       I, Lavonna Monarch, MD, have reviewed all documentation for this visit.  The documentation on 01/25/21 for the exam, diagnosis, procedures, and orders are all accurate and complete.

## 2021-01-30 ENCOUNTER — Telehealth: Payer: Self-pay | Admitting: Dermatology

## 2021-01-30 NOTE — Telephone Encounter (Signed)
Left message biopsy not back call Tuesday next week

## 2021-01-30 NOTE — Telephone Encounter (Signed)
Patient's wife calling for Bx results from last OV on 01/20/2021. She states that area is healing fine.

## 2021-02-05 ENCOUNTER — Telehealth: Payer: Self-pay

## 2021-02-05 NOTE — Telephone Encounter (Signed)
Path to patient and surgery made

## 2021-02-05 NOTE — Telephone Encounter (Signed)
-----   Message from Lavonna Monarch, MD sent at 01/31/2021  7:08 AM EDT ----- Schedule surgery with Dr. Darene Lamer; we will decide at that time whether to refer for Mohs surgery for any of these biopsies.

## 2021-02-27 DIAGNOSIS — Z888 Allergy status to other drugs, medicaments and biological substances status: Secondary | ICD-10-CM | POA: Diagnosis not present

## 2021-02-27 DIAGNOSIS — Z8709 Personal history of other diseases of the respiratory system: Secondary | ICD-10-CM | POA: Diagnosis not present

## 2021-02-27 DIAGNOSIS — H903 Sensorineural hearing loss, bilateral: Secondary | ICD-10-CM | POA: Diagnosis not present

## 2021-02-27 DIAGNOSIS — H6123 Impacted cerumen, bilateral: Secondary | ICD-10-CM | POA: Diagnosis not present

## 2021-02-27 DIAGNOSIS — J343 Hypertrophy of nasal turbinates: Secondary | ICD-10-CM | POA: Diagnosis not present

## 2021-02-27 DIAGNOSIS — Z98818 Other dental procedure status: Secondary | ICD-10-CM | POA: Diagnosis not present

## 2021-02-27 DIAGNOSIS — J328 Other chronic sinusitis: Secondary | ICD-10-CM | POA: Diagnosis not present

## 2021-02-27 DIAGNOSIS — J342 Deviated nasal septum: Secondary | ICD-10-CM | POA: Diagnosis not present

## 2021-02-27 DIAGNOSIS — H9190 Unspecified hearing loss, unspecified ear: Secondary | ICD-10-CM | POA: Diagnosis not present

## 2021-02-27 DIAGNOSIS — Z885 Allergy status to narcotic agent status: Secondary | ICD-10-CM | POA: Diagnosis not present

## 2021-03-06 ENCOUNTER — Other Ambulatory Visit: Payer: Self-pay | Admitting: Family Medicine

## 2021-03-10 ENCOUNTER — Other Ambulatory Visit: Payer: Self-pay

## 2021-03-10 ENCOUNTER — Ambulatory Visit: Payer: Medicare Other | Admitting: Family Medicine

## 2021-03-11 ENCOUNTER — Encounter: Payer: Self-pay | Admitting: Family Medicine

## 2021-03-27 ENCOUNTER — Encounter: Payer: Self-pay | Admitting: Dermatology

## 2021-03-27 ENCOUNTER — Other Ambulatory Visit: Payer: Self-pay

## 2021-03-27 ENCOUNTER — Ambulatory Visit (INDEPENDENT_AMBULATORY_CARE_PROVIDER_SITE_OTHER): Payer: Medicare Other | Admitting: Dermatology

## 2021-03-27 DIAGNOSIS — C44529 Squamous cell carcinoma of skin of other part of trunk: Secondary | ICD-10-CM

## 2021-03-27 DIAGNOSIS — D485 Neoplasm of uncertain behavior of skin: Secondary | ICD-10-CM

## 2021-03-27 NOTE — Patient Instructions (Signed)

## 2021-04-07 ENCOUNTER — Encounter: Payer: Self-pay | Admitting: Dermatology

## 2021-04-07 NOTE — Progress Notes (Signed)
   Follow-Up Visit   Subjective  [name] is a 87 y.o. male who presents for the following: Procedure (Patient also has a KA mid chest ).  New growth on chest Location:  Duration:  Quality:  Associated Signs/Symptoms: Modifying Factors:  Severity:  Timing: Context:   Objective  Well appearing patient in no apparent distress; mood and affect are within normal limits. Chest - Medial [facility]) Centrally eroded two centimeter crust         All skin waist up examined.   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 Chest - Medial (Center)  Skin / nail biopsy Type of biopsy: tangential   Informed consent: discussed and consent obtained   Timeout: patient name, date of birth, surgical site, and procedure verified   Anesthesia: the lesion was anesthetized in a standard fashion   Anesthetic:  1% lidocaine w/ epinephrine 1-100,000 local infiltration Instrument used: flexible razor blade   Hemostasis achieved with: ferric subsulfate   Outcome: patient tolerated procedure well   Post-procedure details: wound care instructions given    Destruction of lesion Complexity: simple   Destruction method: electrodesiccation and curettage   Informed consent: discussed and consent obtained   Timeout:  patient name, date of birth, surgical site, and procedure verified Anesthesia: the lesion was anesthetized in a standard fashion   Anesthetic:  1% lidocaine w/ epinephrine 1-100,000 local infiltration Curettage performed in three different directions: Yes   Curettage cycles:  3 Lesion length (cm):  2.2 Lesion width (cm):  2.2 Margin per side (cm):  0 Final wound size (cm):  2.2 Hemostasis achieved with:  aluminum chloride Outcome: patient tolerated procedure well with no complications   Post-procedure details: wound care instructions given    Specimen 1 - Surgical pathology Differential Diagnosis: KA  Check Margins:  No [id number]  After shave biopsy the base and margins were treated with curettage plus cautery.      I, SLavonna Monarch MD, have reviewed all documentation for this visit.  The documentation on 04/07/21 for the exam, diagnosis, procedures, and orders are all accurate and complete.

## 2021-04-08 DIAGNOSIS — L84 Corns and callosities: Secondary | ICD-10-CM | POA: Diagnosis not present

## 2021-04-08 DIAGNOSIS — B351 Tinea unguium: Secondary | ICD-10-CM | POA: Diagnosis not present

## 2021-04-08 DIAGNOSIS — M79676 Pain in unspecified toe(s): Secondary | ICD-10-CM | POA: Diagnosis not present

## 2021-04-08 DIAGNOSIS — E1142 Type 2 diabetes mellitus with diabetic polyneuropathy: Secondary | ICD-10-CM | POA: Diagnosis not present

## 2021-04-10 ENCOUNTER — Encounter: Payer: Self-pay | Admitting: Family Medicine

## 2021-04-10 ENCOUNTER — Other Ambulatory Visit: Payer: Self-pay

## 2021-04-10 ENCOUNTER — Ambulatory Visit (INDEPENDENT_AMBULATORY_CARE_PROVIDER_SITE_OTHER): Payer: Medicare Other | Admitting: Family Medicine

## 2021-04-10 VITALS — BP 138/69 | HR 62 | Ht 71.0 in | Wt 189.0 lb

## 2021-04-10 DIAGNOSIS — E785 Hyperlipidemia, unspecified: Secondary | ICD-10-CM | POA: Diagnosis not present

## 2021-04-10 DIAGNOSIS — I480 Paroxysmal atrial fibrillation: Secondary | ICD-10-CM

## 2021-04-10 DIAGNOSIS — E1169 Type 2 diabetes mellitus with other specified complication: Secondary | ICD-10-CM

## 2021-04-10 DIAGNOSIS — E1159 Type 2 diabetes mellitus with other circulatory complications: Secondary | ICD-10-CM | POA: Diagnosis not present

## 2021-04-10 DIAGNOSIS — I152 Hypertension secondary to endocrine disorders: Secondary | ICD-10-CM | POA: Diagnosis not present

## 2021-04-10 LAB — BAYER DCA HB A1C WAIVED: HB A1C (BAYER DCA - WAIVED): 5.6 % (ref 4.8–5.6)

## 2021-04-10 MED ORDER — RIVAROXABAN 20 MG PO TABS: 20.0000 mg | ORAL_TABLET | Freq: Every day | ORAL | 3 refills | Status: DC

## 2021-04-10 MED ORDER — HYDROCHLOROTHIAZIDE 25 MG PO TABS: 25.0000 mg | ORAL_TABLET | Freq: Every day | ORAL | 3 refills | Status: DC

## 2021-04-10 MED ORDER — METFORMIN HCL ER 500 MG PO TB24: 500.0000 mg | ORAL_TABLET | Freq: Every day | ORAL | 3 refills | Status: DC

## 2021-04-10 MED ORDER — PANTOPRAZOLE SODIUM 40 MG PO TBEC: 40.0000 mg | DELAYED_RELEASE_TABLET | Freq: Every day | ORAL | 3 refills | Status: DC

## 2021-04-10 MED ORDER — ATORVASTATIN CALCIUM 80 MG PO TABS: 80.0000 mg | ORAL_TABLET | Freq: Every day | ORAL | 3 refills | Status: DC

## 2021-04-10 MED ORDER — HYDRALAZINE HCL 25 MG PO TABS: 25.0000 mg | ORAL_TABLET | Freq: Every day | ORAL | 3 refills | Status: DC | PRN

## 2021-04-10 MED ORDER — BENAZEPRIL HCL 20 MG PO TABS: 20.0000 mg | ORAL_TABLET | Freq: Every day | ORAL | 3 refills | Status: DC

## 2021-04-10 MED ORDER — METOPROLOL TARTRATE 100 MG PO TABS: 100.0000 mg | ORAL_TABLET | Freq: Two times a day (BID) | ORAL | 3 refills | Status: DC

## 2021-04-10 NOTE — Progress Notes (Signed)
BP 138/69   Pulse 62   Ht $R'5\' 11"'Af$  (1.803 m)   Wt 189 lb (85.7 kg)   SpO2 99%   BMI 26.36 kg/m    Subjective:   Patient ID: [name], male    DOB: 1930-05-24, 87 y.o.   MRN: [id number]  HPI: [name] is a 87 y.o. male presenting on 04/10/2021 for Diabetes and Medical Management of Chronic Issues   HPI Type 2 diabetes mellitus Patient comes in today for recheck of his diabetes. Patient has been currently taking metformin, A1c is 5.6. Patient is currently on an ACE inhibitor/ARB. Patient has seen an ophthalmologist this year. Patient denies any issues with their feet. The symptom started onset as an adult hypertension hyperlipidemia and A. fib and coronary atherosclerosis ARE RELATED TO DM   Hypertension and A. fib Patient is currently on benazepril and hydralazine and hydrochlorothiazide and metoprolol, and their blood pressure today is 138/69. Patient denies any lightheadedness or dizziness. Patient denies headaches, blurred vision, chest pains, shortness of breath, or weakness. Denies any side effects from medication and is content with current medication.   Hyperlipidemia Patient is coming in for recheck of his hyperlipidemia. The patient is currently taking fish oil and atorvastatin. They deny any issues with myalgias or history of liver damage from it. They deny any focal numbness or weakness or chest pain.   Relevant past medical, surgical, family and social history reviewed and updated as indicated. Interim medical history since our last visit reviewed. Allergies and medications reviewed and updated.  Review of Systems  Constitutional:  Negative for chills and fever.  Respiratory:  Negative for shortness of breath and wheezing.   Cardiovascular:  Negative for chest pain and leg swelling.  Musculoskeletal:  Negative for back pain and gait problem.  Skin:  Negative for rash.  Neurological:  Negative for dizziness, weakness and numbness.  All other systems reviewed  and are negative.  Per HPI unless specifically indicated above   Allergies as of 04/10/2021       Reactions   Alteplase Anaphylaxis   Codeine Other (See Comments)   Bad dreams   Lopid [gemfibrozil] Nausea Only   Voltaren [diclofenac Sodium] Diarrhea        Medication List        Accurate as of April 10, 2021  1:21 PM. If you have any questions, ask your nurse or doctor.          Accu-Chek FastClix Lancets Misc Check BS up to 4 TIMES DAILY Dx E11.9   Accu-Chek Guide test strip Generic drug: glucose blood Check BS up to 4 TIMES DAILY Dx E11.9   acetaminophen 325 MG tablet Commonly known as: TYLENOL Take 650 mg by mouth every 6 (six) hours as needed for mild pain, fever or headache.   Advair Diskus 100-50 MCG/ACT Aepb Generic drug: fluticasone-salmeterol INHALE 1 DOSE BY MOUTH EVERY 12 HOURS   albuterol 108 (90 Base) MCG/ACT inhaler Commonly known as: VENTOLIN HFA INHALE 1 TO 2 PUFFS BY MOUTH EVERY 6 HOURS AS NEEDED FOR WHEEZING OR SHORTNESS OF BREATH   atorvastatin 80 MG tablet Commonly known as: LIPITOR Take 1 tablet (80 mg total) by mouth daily.   benazepril 20 MG tablet Commonly known as: LOTENSIN Take 1 tablet (20 mg total) by mouth daily.   blood glucose meter kit and supplies Kit Dispense based on patient and insurance preference. Use four times daily as directed.  Diagnosis type 2 diabetes   feeding supplement (  GLUCERNA SHAKE) Liqd Take 237 mLs by mouth 2 (two) times daily between meals.   fish oil-omega-3 fatty acids 1000 MG capsule Take 2 g by mouth every evening.   hydrALAZINE 25 MG tablet Commonly known as: APRESOLINE Take 1 tablet (25 mg total) by mouth daily as needed.   hydrochlorothiazide 25 MG tablet Commonly known as: HYDRODIURIL Take 1 tablet (25 mg total) by mouth daily.   metFORMIN 500 MG 24 hr tablet Commonly known as: GLUCOPHAGE-XR Take 1 tablet (500 mg total) by mouth 2 (two) times daily.   metoprolol tartrate 100 MG  tablet Commonly known as: LOPRESSOR Take 1 tablet (100 mg total) by mouth 2 (two) times daily.   nitroGLYCERIN 0.4 MG SL tablet Commonly known as: NITROSTAT USE AS DIRECTED What changed:  how much to take how to take this when to take this reasons to take this   pantoprazole 40 MG tablet Commonly known as: PROTONIX Take 1 tablet (40 mg total) by mouth daily.   rivaroxaban 20 MG Tabs tablet Commonly known as: Xarelto Take 1 tablet (20 mg total) by mouth daily with supper.   Vitamin D 50 MCG (2000 UT) Caps Take 2,000 Units by mouth daily.         Objective:   BP 138/69   Pulse 62   Ht $R'5\' 11"'xG$  (1.803 m)   Wt 189 lb (85.7 kg)   SpO2 99%   BMI 26.36 kg/m   Wt Readings from Last 3 Encounters:  04/10/21 189 lb (85.7 kg)  01/02/21 186 lb (84.4 kg)  12/25/20 187 lb (84.8 kg)    Physical Exam Vitals and nursing note reviewed.  Constitutional:      General: He is not in acute distress.    Appearance: He is well-developed. He is not diaphoretic.  Eyes:     General: No scleral icterus.    Conjunctiva/sclera: Conjunctivae normal.  Neck:     Thyroid: No thyromegaly.  Cardiovascular:     Rate and Rhythm: Normal rate and regular rhythm.     Heart sounds: Normal heart sounds. No murmur heard. Pulmonary:     Effort: Pulmonary effort is normal. No respiratory distress.     Breath sounds: Normal breath sounds. No wheezing.  Musculoskeletal:        General: Normal range of motion.     Cervical back: Neck supple.  Lymphadenopathy:     Cervical: No cervical adenopathy.  Skin:    General: Skin is warm and dry.     Findings: No rash.  Neurological:     Mental Status: He is alert and oriented to person, place, and time.     Coordination: Coordination normal.  Psychiatric:        Behavior: Behavior normal.      Assessment & Plan:   Problem List Items Addressed This Visit       Cardiovascular and Mediastinum   Hypertension associated with diabetes ([place])   Relevant  Orders   CBC with Differential/Platelet   CMP14+EGFR   Lipid panel   Bayer DCA Hb A1c Waived   Paroxysmal atrial fibrillation ([facility])     Endocrine   Hyperlipidemia associated with type 2 diabetes mellitus ([place])   Relevant Orders   CBC with Differential/Platelet   CMP14+EGFR   Lipid panel   Bayer DCA Hb A1c Waived   DM type 2 (diabetes mellitus, type 2) ([place]) - Primary   Relevant Orders   CBC with Differential/Platelet   CMP14+EGFR   Lipid panel  Bayer DCA Hb A1c Waived  Patient has multiple squamous cell skin lesions and is seeing Dr. Denna Haggard dermatology with this  A1c looks good, offered to stop metformin  No other changes in medication  Follow up plan: Return in about 6 months (around 10/08/2021), or if symptoms worsen or fail to improve, for Diabetes and hypertension and hyperlipidemia.  Counseling provided for all of the vaccine components Orders Placed This Encounter  Procedures   CBC with Differential/Platelet   CMP14+EGFR   Lipid panel   Bayer DCA Hb A1c [place], MD [facility] 04/10/2021, 1:21 PM

## 2021-04-11 ENCOUNTER — Telehealth: Payer: Self-pay

## 2021-04-11 LAB — CBC WITH DIFFERENTIAL/PLATELET
Basophils Absolute: 0 10*3/uL (ref 0.0–0.2)
Basos: 1 %
EOS (ABSOLUTE): 0.4 10*3/uL (ref 0.0–0.4)
Eos: 5 %
Hematocrit: 38.5 % (ref 37.5–51.0)
Hemoglobin: 13.8 g/dL (ref 13.0–17.7)
Immature Grans (Abs): 0 10*3/uL (ref 0.0–0.1)
Immature Granulocytes: 0 %
Lymphocytes Absolute: 2.4 10*3/uL (ref 0.7–3.1)
Lymphs: 32 %
MCH: 34.2 pg — ABNORMAL HIGH (ref 26.6–33.0)
MCHC: 35.8 g/dL — ABNORMAL HIGH (ref 31.5–35.7)
MCV: 95 fL (ref 79–97)
Monocytes Absolute: 0.8 10*3/uL (ref 0.1–0.9)
Monocytes: 10 %
Neutrophils Absolute: 4.1 10*3/uL (ref 1.4–7.0)
Neutrophils: 52 %
Platelets: 172 10*3/uL (ref 150–450)
RBC: 4.04 x10E6/uL — ABNORMAL LOW (ref 4.14–5.80)
RDW: 11.6 % (ref 11.6–15.4)
WBC: 7.7 10*3/uL (ref 3.4–10.8)

## 2021-04-11 LAB — CMP14+EGFR
ALT: 17 IU/L (ref 0–44)
AST: 23 IU/L (ref 0–40)
Albumin/Globulin Ratio: 2 (ref 1.2–2.2)
Albumin: 4.5 g/dL (ref 3.6–4.6)
Alkaline Phosphatase: 76 IU/L (ref 44–121)
BUN/Creatinine Ratio: 11 (ref 10–24)
BUN: 13 mg/dL (ref 8–27)
Bilirubin Total: 0.9 mg/dL (ref 0.0–1.2)
CO2: 25 mmol/L (ref 20–29)
Calcium: 8.9 mg/dL (ref 8.6–10.2)
Chloride: 94 mmol/L — ABNORMAL LOW (ref 96–106)
Creatinine, Ser: 1.18 mg/dL (ref 0.76–1.27)
Globulin, Total: 2.2 g/dL (ref 1.5–4.5)
Glucose: 98 mg/dL (ref 65–99)
Potassium: 4.1 mmol/L (ref 3.5–5.2)
Sodium: 135 mmol/L (ref 134–144)
Total Protein: 6.7 g/dL (ref 6.0–8.5)
eGFR: 59 mL/min/{1.73_m2} — ABNORMAL LOW (ref >59)

## 2021-04-11 LAB — LIPID PANEL
Chol/HDL Ratio: 2.6 ratio (ref 0.0–5.0)
Cholesterol, Total: 100 mg/dL (ref 100–199)
HDL: 39 mg/dL — ABNORMAL LOW (ref >39)
LDL Chol Calc (NIH): 41 mg/dL (ref 0–99)
Triglycerides: 108 mg/dL (ref 0–149)
VLDL Cholesterol Cal: 20 mg/dL (ref 5–40)

## 2021-04-11 NOTE — Telephone Encounter (Signed)
Received fax from pharmacy to clarify Metformin ER '500mg'$  tab dosing instructions. Once daily was refilled but states patient has been taking BID. According to note you offered to stop metformin. Did you reduce his rx to one daily? Please review and advise

## 2021-04-15 NOTE — Telephone Encounter (Signed)
Cyril Mourning at [facility] informed.  Changed script to #90 with 3 refills

## 2021-04-15 NOTE — Telephone Encounter (Signed)
Yes after discussing it with the patient we had talked about reducing it to once daily.

## 2021-04-16 ENCOUNTER — Other Ambulatory Visit: Payer: Self-pay | Admitting: Family Medicine

## 2021-04-16 MED ORDER — FLUTICASONE-SALMETEROL 100-50 MCG/ACT IN AEPB: 1.0000 | INHALATION_SPRAY | Freq: Two times a day (BID) | RESPIRATORY_TRACT | 3 refills | Status: DC

## 2021-04-16 NOTE — Progress Notes (Signed)
Please call pt back about lab results

## 2021-04-17 ENCOUNTER — Encounter: Payer: Self-pay | Admitting: *Deleted

## 2021-05-16 ENCOUNTER — Other Ambulatory Visit: Payer: Self-pay | Admitting: Family Medicine

## 2021-05-22 ENCOUNTER — Encounter: Payer: Self-pay | Admitting: Dermatology

## 2021-05-22 ENCOUNTER — Ambulatory Visit (INDEPENDENT_AMBULATORY_CARE_PROVIDER_SITE_OTHER): Payer: Medicare Other | Admitting: Dermatology

## 2021-05-22 ENCOUNTER — Other Ambulatory Visit: Payer: Self-pay

## 2021-05-22 DIAGNOSIS — D099 Carcinoma in situ, unspecified: Secondary | ICD-10-CM

## 2021-05-22 DIAGNOSIS — D0439 Carcinoma in situ of skin of other parts of face: Secondary | ICD-10-CM

## 2021-05-22 DIAGNOSIS — D485 Neoplasm of uncertain behavior of skin: Secondary | ICD-10-CM

## 2021-05-22 DIAGNOSIS — C4492 Squamous cell carcinoma of skin, unspecified: Secondary | ICD-10-CM

## 2021-05-22 HISTORY — DX: Squamous cell carcinoma of skin, unspecified: C44.92

## 2021-05-29 ENCOUNTER — Telehealth: Payer: Self-pay

## 2021-05-29 NOTE — Telephone Encounter (Signed)
Phone call to patient with his pathology results. Patient aware of results.  

## 2021-05-29 NOTE — Telephone Encounter (Signed)
-----   Message from Lavonna Monarch, MD sent at 05/29/2021  5:52 AM EDT ----- Schedule surgery with Dr. Darene Lamer

## 2021-06-08 ENCOUNTER — Encounter: Payer: Self-pay | Admitting: Dermatology

## 2021-06-08 NOTE — Progress Notes (Signed)
   Follow-Up Visit   Subjective  [name] is a 87 y.o. male who presents for the following: Follow-up (Biopsy more lesions v/s treat June lesions ).  Multiple biopsy-proven nonmelanoma skin cancers plus other growing crusts Location:  Duration:  Quality:  Associated Signs/Symptoms: Modifying Factors:  Severity:  Timing: Context:   Objective  Well appearing patient in no apparent distress; mood and affect are within normal limits. Right Parotid Area Lesion identified by Dr.Ishani Goldwasser and nurse in room.    Left Buccal Cheek Biopsy site identified by nurse and me.       A focused examination was performed including head and neck. Relevant physical exam findings are noted in the Assessment and Plan.   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  Destruction of lesion Complexity: simple   Destruction method: electrodesiccation and curettage   Informed consent: discussed and consent obtained   Timeout:  patient name, date of birth, surgical site, and procedure verified Anesthesia: the lesion was anesthetized in a standard fashion   Anesthetic:  1% lidocaine w/ epinephrine 1-100,000 local infiltration Curettage performed in three different directions: Yes   Curettage cycles:  3 Lesion length (cm):  1.3 Lesion width (cm):  1.3 Margin per side (cm):  0 Final wound size (cm):  1.3 Hemostasis achieved with:  ferric subsulfate Outcome: patient tolerated procedure well with no complications   Additional details:  Wound innoculated with 5 fluorouracil solution.  Other two lesions left brow and adjacent will refer for mohs    Carcinoma in situ of skin of other part of face Left Buccal Cheek  Destruction of lesion - Left Buccal Cheek Complexity: simple   Destruction method: electrodesiccation and curettage   Informed consent: discussed and consent obtained   Timeout:  patient name, date of birth, surgical site, and procedure  verified Anesthesia: the lesion was anesthetized in a standard fashion   Anesthetic:  1% lidocaine w/ epinephrine 1-100,000 local infiltration Curettage cycles:  3 Lesion length (cm):  2.2 Lesion width (cm):  2.2 Margin per side (cm):  0 Final wound size (cm):  2.2 Hemostasis achieved with:  ferric subsulfate Outcome: patient tolerated procedure well with no complications   Post-procedure details: sterile dressing applied and wound care instructions given   Dressing type: bandage and petrolatum    Specimen 1 - Surgical pathology Differential Diagnosis: bcc scc  Check Margins: No      I, Lavonna Monarch, MD, have reviewed all documentation for this visit.  The documentation on 06/08/21 for the exam, diagnosis, procedures, and orders are all accurate and complete.

## 2021-06-17 DIAGNOSIS — B351 Tinea unguium: Secondary | ICD-10-CM | POA: Diagnosis not present

## 2021-06-17 DIAGNOSIS — E1142 Type 2 diabetes mellitus with diabetic polyneuropathy: Secondary | ICD-10-CM | POA: Diagnosis not present

## 2021-06-17 DIAGNOSIS — L84 Corns and callosities: Secondary | ICD-10-CM | POA: Diagnosis not present

## 2021-06-17 DIAGNOSIS — M79676 Pain in unspecified toe(s): Secondary | ICD-10-CM | POA: Diagnosis not present

## 2021-07-24 ENCOUNTER — Other Ambulatory Visit: Payer: Self-pay

## 2021-07-24 ENCOUNTER — Ambulatory Visit (INDEPENDENT_AMBULATORY_CARE_PROVIDER_SITE_OTHER): Payer: Medicare Other | Admitting: Dermatology

## 2021-07-24 ENCOUNTER — Encounter: Payer: Self-pay | Admitting: Dermatology

## 2021-07-24 DIAGNOSIS — C44310 Basal cell carcinoma of skin of unspecified parts of face: Secondary | ICD-10-CM

## 2021-07-24 DIAGNOSIS — D099 Carcinoma in situ, unspecified: Secondary | ICD-10-CM

## 2021-07-24 DIAGNOSIS — C44529 Squamous cell carcinoma of skin of other part of trunk: Secondary | ICD-10-CM | POA: Diagnosis not present

## 2021-07-24 DIAGNOSIS — D0439 Carcinoma in situ of skin of other parts of face: Secondary | ICD-10-CM | POA: Diagnosis not present

## 2021-07-24 DIAGNOSIS — C44319 Basal cell carcinoma of skin of other parts of face: Secondary | ICD-10-CM

## 2021-07-24 DIAGNOSIS — D485 Neoplasm of uncertain behavior of skin: Secondary | ICD-10-CM

## 2021-07-24 NOTE — Patient Instructions (Signed)

## 2021-07-31 ENCOUNTER — Telehealth: Payer: Self-pay | Admitting: *Deleted

## 2021-07-31 NOTE — Telephone Encounter (Signed)
-----   Message from Lavonna Monarch, MD sent at 07/31/2021  4:51 AM EST ----- Schedule Mohs

## 2021-07-31 NOTE — Telephone Encounter (Signed)
Pathology to patient- referral sent to skin surgery center for MOHS 

## 2021-08-12 ENCOUNTER — Other Ambulatory Visit: Payer: Self-pay | Admitting: Family Medicine

## 2021-08-12 DIAGNOSIS — E1159 Type 2 diabetes mellitus with other circulatory complications: Secondary | ICD-10-CM

## 2021-08-24 ENCOUNTER — Encounter: Payer: Self-pay | Admitting: Dermatology

## 2021-08-24 NOTE — Progress Notes (Signed)
° °  Follow-Up Visit   Subjective  [name] is a 88 y.o. male who presents for the following: Procedure (Left buccal cheek scc).  Biopsy-proven CIS left cheek; regrowth lesion on upper chest. Location:  Duration:  Quality:  Associated Signs/Symptoms: Modifying Factors:  Severity:  Timing: Context:   Objective  Well appearing patient in no apparent distress; mood and affect are within normal limits. Chest - Medial [facility]) Rapid regrowth after biopsy/curettage of the lesion V of neck.       Left Buccal Cheek Lesion identified by Dr.Zorian Gunderman and nurse in room.    Left Eyebrow Biopsy from 01/20/2021, has never been treated         All skin waist up examined.   Assessment & Plan    Neoplasm of uncertain behavior of skin Chest - Medial (Center)  Skin / nail biopsy Type of biopsy: tangential   Informed consent: discussed and consent obtained   Timeout: patient name, date of birth, surgical site, and procedure verified   Anesthesia: the lesion was anesthetized in a standard fashion   Anesthetic:  1% lidocaine w/ epinephrine 1-100,000 local infiltration Instrument used: flexible razor blade   Hemostasis achieved with: aluminum chloride and electrodesiccation   Outcome: patient tolerated procedure well   Post-procedure details: wound care instructions given    Specimen 1 - Surgical pathology Differential Diagnosis: R/O BCC VS SCC [id number] Check Margins: No  If biopsy shows residual SCCA will refer for Mohs  Squamous cell carcinoma in situ Left Buccal Cheek  Destruction of lesion Complexity: simple   Destruction method: electrodesiccation and curettage   Informed consent: discussed and consent obtained   Timeout:  patient name, date of birth, surgical site, and procedure verified Anesthesia: the lesion was anesthetized in a standard fashion   Anesthetic:  1% lidocaine w/ epinephrine 1-100,000 local infiltration Curettage performed in three  different directions: Yes   Electrodesiccation performed over the curetted area: Yes   Curettage cycles:  3 Lesion length (cm):  1.2 Lesion width (cm):  1.2 Margin per side (cm):  0 Final wound size (cm):  1.2 Hemostasis achieved with:  ferric subsulfate and electrodesiccation Outcome: patient tolerated procedure well with no complications   Post-procedure details: sterile dressing applied and wound care instructions given   Dressing type: bandage and petrolatum   Additional details:  Wound inoculated with 5% Fluorouracil.    Basal cell carcinoma (BCC) of skin of face, unspecified part of face Left Eyebrow  No treatment today patient is schedule for [employer] in January      I, Lavonna Monarch, MD, have reviewed all documentation for this visit.  The documentation on 08/24/21 for the exam, diagnosis, procedures, and orders are all accurate and complete.

## 2021-08-26 DIAGNOSIS — E1142 Type 2 diabetes mellitus with diabetic polyneuropathy: Secondary | ICD-10-CM | POA: Diagnosis not present

## 2021-08-26 DIAGNOSIS — L84 Corns and callosities: Secondary | ICD-10-CM | POA: Diagnosis not present

## 2021-08-26 DIAGNOSIS — M79676 Pain in unspecified toe(s): Secondary | ICD-10-CM | POA: Diagnosis not present

## 2021-08-26 DIAGNOSIS — B351 Tinea unguium: Secondary | ICD-10-CM | POA: Diagnosis not present

## 2021-08-28 DIAGNOSIS — C44319 Basal cell carcinoma of skin of other parts of face: Secondary | ICD-10-CM | POA: Diagnosis not present

## 2021-09-02 DIAGNOSIS — Z7901 Long term (current) use of anticoagulants: Secondary | ICD-10-CM | POA: Diagnosis not present

## 2021-09-02 DIAGNOSIS — Z8709 Personal history of other diseases of the respiratory system: Secondary | ICD-10-CM | POA: Diagnosis not present

## 2021-09-02 DIAGNOSIS — J343 Hypertrophy of nasal turbinates: Secondary | ICD-10-CM | POA: Diagnosis not present

## 2021-09-02 DIAGNOSIS — Z87891 Personal history of nicotine dependence: Secondary | ICD-10-CM | POA: Diagnosis not present

## 2021-09-02 DIAGNOSIS — H6123 Impacted cerumen, bilateral: Secondary | ICD-10-CM | POA: Diagnosis not present

## 2021-09-02 DIAGNOSIS — J301 Allergic rhinitis due to pollen: Secondary | ICD-10-CM | POA: Diagnosis not present

## 2021-09-02 DIAGNOSIS — J329 Chronic sinusitis, unspecified: Secondary | ICD-10-CM | POA: Diagnosis not present

## 2021-09-02 DIAGNOSIS — J3081 Allergic rhinitis due to animal (cat) (dog) hair and dander: Secondary | ICD-10-CM | POA: Diagnosis not present

## 2021-09-02 DIAGNOSIS — J342 Deviated nasal septum: Secondary | ICD-10-CM | POA: Diagnosis not present

## 2021-09-09 ENCOUNTER — Other Ambulatory Visit: Payer: Self-pay

## 2021-09-09 ENCOUNTER — Ambulatory Visit: Payer: Medicare Other | Admitting: Dermatology

## 2021-09-09 DIAGNOSIS — L570 Actinic keratosis: Secondary | ICD-10-CM

## 2021-09-09 DIAGNOSIS — Z85828 Personal history of other malignant neoplasm of skin: Secondary | ICD-10-CM

## 2021-09-11 DIAGNOSIS — C44529 Squamous cell carcinoma of skin of other part of trunk: Secondary | ICD-10-CM | POA: Diagnosis not present

## 2021-09-28 ENCOUNTER — Encounter: Payer: Self-pay | Admitting: Dermatology

## 2021-09-28 NOTE — Progress Notes (Signed)
° °  Follow-Up Visit   Subjective  [name] is a 88 y.o. male who presents for the following: Skin Problem (Pt has a spot of concern on the Left Forearm, itches sometimes. Pt has MOHs last month on the Left Eyebrow. Pt going back this Thursday for Mid Upper Chest. Itching - front of the ears B/L).  Slowly growing crust on forearm plus other crusts on face.  In the process of treating larger carcinomas with Mohs surgery. Location:  Duration:  Quality:  Associated Signs/Symptoms: Modifying Factors:  Severity:  Timing: Context:   Objective  Well appearing patient in no apparent distress; mood and affect are within normal limits. Right Parotid Area 1 slightly thicker hornlike 4 mm crust will be treated with LN2 freeze in future  Left Eyebrow No sign of wound infection or residual carcinoma patient has multiple actinic keratoses and some of the larger pink crusts are likely superficial carcinoma.  None currently bother him and we will continue to follow these.    All skin waist up examined.   Assessment & Plan    Actinic keratosis Right Parotid Area  Scattered, pt will return and heal up from [facility] in the meantime Pt has fresh MOHs wound on Mid Upper Chest and Left Brow. Tx deferred until next appt    Personal history of skin cancer Left Eyebrow  Recheck in 6 months      I, Lavonna Monarch, MD, have reviewed all documentation for this visit.  The documentation on 09/28/21 for the exam, diagnosis, procedures, and orders are all accurate and complete.

## 2021-10-08 ENCOUNTER — Encounter: Payer: Self-pay | Admitting: Family Medicine

## 2021-10-08 ENCOUNTER — Ambulatory Visit (INDEPENDENT_AMBULATORY_CARE_PROVIDER_SITE_OTHER): Payer: Medicare Other | Admitting: Family Medicine

## 2021-10-08 VITALS — BP 134/73 | HR 51 | Ht 71.0 in | Wt 183.0 lb

## 2021-10-08 DIAGNOSIS — E1169 Type 2 diabetes mellitus with other specified complication: Secondary | ICD-10-CM | POA: Diagnosis not present

## 2021-10-08 DIAGNOSIS — I152 Hypertension secondary to endocrine disorders: Secondary | ICD-10-CM

## 2021-10-08 DIAGNOSIS — I480 Paroxysmal atrial fibrillation: Secondary | ICD-10-CM

## 2021-10-08 DIAGNOSIS — E785 Hyperlipidemia, unspecified: Secondary | ICD-10-CM

## 2021-10-08 DIAGNOSIS — E1159 Type 2 diabetes mellitus with other circulatory complications: Secondary | ICD-10-CM | POA: Diagnosis not present

## 2021-10-08 LAB — BAYER DCA HB A1C WAIVED: HB A1C (BAYER DCA - WAIVED): 5.9 % — ABNORMAL HIGH (ref 4.8–5.6)

## 2021-10-08 MED ORDER — HYDROCHLOROTHIAZIDE 25 MG PO TABS: 25.0000 mg | ORAL_TABLET | Freq: Every day | ORAL | 3 refills | Status: DC

## 2021-10-08 NOTE — Progress Notes (Addendum)
? ?BP 134/73   Pulse (!) 51   Ht $R'5\' 11"'tn$  (1.803 m)   Wt 183 lb (83 kg)   SpO2 96%   BMI 25.52 kg/m?   ? ?Subjective:  ? ?Patient ID: [name], male    DOB: 29-Jul-1930, 88 y.o.   MRN: [id number] ? ?HPI: ?[name] is a 88 y.o. male presenting on 10/08/2021 for Medical Management of Chronic Issues, Diabetes, and Hypertension ? ? ?HPI ?Type 2 diabetes mellitus ?Patient comes in today for recheck of his diabetes. Patient has been currently taking metformin and a1c is 5.9. Patient is currently on an ACE inhibitor/ARB. Patient has not seen an ophthalmologist this year. Patient denies any issues with their feet. The symptom started onset as an adult hypertension and hyperlipidemia.  ARE RELATED TO DM  ? ?Hypertension ?Patient is currently on hydralazine and hydrochlorothiazide and metoprolol and benazepril, and their blood pressure today is 134/73. Patient denies any lightheadedness or dizziness. Patient denies headaches, blurred vision, chest pains, shortness of breath, or weakness. Denies any side effects from medication and is content with current medication.  ? ?Hyperlipidemia ?Patient is coming in for recheck of his hyperlipidemia. The patient is currently taking atorvastatin and fish oils. They deny any issues with myalgias or history of liver damage from it. They deny any focal numbness or weakness or chest pain.  ? ?Relevant past medical, surgical, family and social history reviewed and updated as indicated. Interim medical history since our last visit reviewed. ?Allergies and medications reviewed and updated. ? ?Review of Systems  ?Constitutional:  Negative for chills and fever.  ?Eyes:  Negative for visual disturbance.  ?Respiratory:  Negative for shortness of breath and wheezing.   ?Cardiovascular:  Negative for chest pain and leg swelling.  ?Musculoskeletal:  Negative for back pain and gait problem.  ?Skin:  Negative for rash.  ?Neurological:  Negative for dizziness, weakness and light-headedness.   ?All other systems reviewed and are negative. ? ?Per HPI unless specifically indicated above ? ? ?Allergies as of 10/08/2021   ? ?   Reactions  ? Alteplase Anaphylaxis  ? Codeine Other (See Comments)  ? Bad dreams  ? Lopid [gemfibrozil] Nausea Only  ? Voltaren [diclofenac Sodium] Diarrhea  ? ?  ? ?  ?Medication List  ?  ? ?  ? Accurate as of October 08, 2021 10:07 AM. If you have any questions, ask your nurse or doctor.  ?  ?  ? ?  ? ?Accu-Chek FastClix Lancets Misc ?Check BS up to 4 TIMES DAILY Dx E11.9 ?  ?Accu-Chek Guide test strip ?Generic drug: glucose blood ?Check BS up to 4 TIMES DAILY Dx E11.9 ?  ?acetaminophen 325 MG tablet ?Commonly known as: TYLENOL ?Take 650 mg by mouth every 6 (six) hours as needed for mild pain, fever or headache. ?  ?albuterol 108 (90 Base) MCG/ACT inhaler ?Commonly known as: VENTOLIN HFA ?INHALE 1 TO 2 PUFFS BY MOUTH EVERY 6 HOURS AS NEEDED FOR WHEEZING FOR SHORTNESS OF BREATH ?  ?atorvastatin 80 MG tablet ?Commonly known as: LIPITOR ?Take 1 tablet (80 mg total) by mouth daily. ?  ?benazepril 20 MG tablet ?Commonly known as: LOTENSIN ?Take 1 tablet (20 mg total) by mouth daily. ?  ?blood glucose meter kit and supplies Kit ?Dispense based on patient and insurance preference. Use four times daily as directed.  Diagnosis type 2 diabetes ?  ?feeding supplement (GLUCERNA SHAKE) Liqd ?Take 237 mLs by mouth 2 (two) times daily between meals. ?  ?  fish oil-omega-3 fatty acids 1000 MG capsule ?Take 2 g by mouth every evening. ?  ?fluticasone-salmeterol 100-50 MCG/ACT Aepb ?Commonly known as: ADVAIR ?Inhale 1 puff into the lungs 2 (two) times daily. ?  ?hydrALAZINE 25 MG tablet ?Commonly known as: APRESOLINE ?Take 1 tablet (25 mg total) by mouth daily as needed. ?  ?hydrochlorothiazide 25 MG tablet ?Commonly known as: HYDRODIURIL ?Take 1 tablet (25 mg total) by mouth daily. ?  ?metFORMIN 500 MG 24 hr tablet ?Commonly known as: GLUCOPHAGE-XR ?Take 1 tablet (500 mg total) by mouth daily with  breakfast. ?  ?metoprolol tartrate 100 MG tablet ?Commonly known as: LOPRESSOR ?Take 1 tablet (100 mg total) by mouth 2 (two) times daily. ?  ?nitroGLYCERIN 0.4 MG SL tablet ?Commonly known as: NITROSTAT ?USE AS DIRECTED ?What changed:  ?how much to take ?how to take this ?when to take this ?reasons to take this ?  ?pantoprazole 40 MG tablet ?Commonly known as: PROTONIX ?Take 1 tablet (40 mg total) by mouth daily. ?  ?rivaroxaban 20 MG Tabs tablet ?Commonly known as: Xarelto ?Take 1 tablet (20 mg total) by mouth daily with supper. ?  ?Vitamin D 50 MCG (2000 UT) Caps ?Take 2,000 Units by mouth daily. ?  ? ?  ? ? ? ?Objective:  ? ?BP 134/73   Pulse (!) 51   Ht $R'5\' 11"'nG$  (1.803 m)   Wt 183 lb (83 kg)   SpO2 96%   BMI 25.52 kg/m?   ?Wt Readings from Last 3 Encounters:  ?10/08/21 183 lb (83 kg)  ?04/10/21 189 lb (85.7 kg)  ?01/02/21 186 lb (84.4 kg)  ?  ?Physical Exam ?Vitals and nursing note reviewed.  ?Constitutional:   ?   General: He is not in acute distress. ?   Appearance: He is well-developed. He is not diaphoretic.  ?Eyes:  ?   General: No scleral icterus. ?   Conjunctiva/sclera: Conjunctivae normal.  ?Neck:  ?   Thyroid: No thyromegaly.  ?Cardiovascular:  ?   Rate and Rhythm: Normal rate and regular rhythm.  ?   Heart sounds: Normal heart sounds. No murmur heard. ?Pulmonary:  ?   Effort: Pulmonary effort is normal. No respiratory distress.  ?   Breath sounds: Normal breath sounds. No wheezing.  ?Musculoskeletal:     ?   General: Normal range of motion.  ?   Cervical back: Neck supple.  ?Lymphadenopathy:  ?   Cervical: No cervical adenopathy.  ?Skin: ?   General: Skin is warm and dry.  ?   Findings: No rash.  ?Neurological:  ?   Mental Status: He is alert and oriented to person, place, and time.  ?   Coordination: Coordination normal.  ?Psychiatric:     ?   Behavior: Behavior normal.  ? ? ? ? ?Assessment & Plan:  ? ?Problem List Items Addressed This Visit   ? ?  ? Cardiovascular and Mediastinum  ? Hypertension  associated with diabetes ([place])  ? Relevant Medications  ? hydrochlorothiazide (HYDRODIURIL) 25 MG tablet  ? Other Relevant Orders  ? CBC with Differential/Platelet  ? CMP14+EGFR  ? Lipid panel  ? Bayer DCA Hb A1c Waived  ? PSA, total and free  ? Paroxysmal atrial fibrillation (HCC)  ? Relevant Medications  ? hydrochlorothiazide (HYDRODIURIL) 25 MG tablet  ?  ? Endocrine  ? Hyperlipidemia associated with type 2 diabetes mellitus ([place])  ? Relevant Medications  ? hydrochlorothiazide (HYDRODIURIL) 25 MG tablet  ? Other Relevant Orders  ? CBC with  Differential/Platelet  ? CMP14+EGFR  ? Lipid panel  ? Bayer DCA Hb A1c Waived  ? PSA, total and free  ? DM type 2 (diabetes mellitus, type 2) ([place]) - Primary  ? Relevant Orders  ? CBC with Differential/Platelet  ? CMP14+EGFR  ? Lipid panel  ? Bayer DCA Hb A1c Waived  ? PSA, total and free  ?Continue current medicine, A1c looks good.  Blood pressure looks good.  Await results from rest of lab work.  No changes ? ?Follow up plan: ?Return in about 6 months (around 04/10/2022), or if symptoms worsen or fail to improve, for Diabetes hypertension cholesterol. ? ?Counseling provided for all of the vaccine components ?Orders Placed This Encounter  ?Procedures  ? CBC with Differential/Platelet  ? CMP14+EGFR  ? Lipid panel  ? Bayer DCA Hb A1c Waived  ? PSA, total and free  ? ? ?Caryl Pina, MD ?[place] ?10/08/2021, 10:07 AM ? ? ? ? ?

## 2021-10-09 LAB — CBC WITH DIFFERENTIAL/PLATELET
Basophils Absolute: 0 10*3/uL (ref 0.0–0.2)
Basos: 0 %
EOS (ABSOLUTE): 0.3 10*3/uL (ref 0.0–0.4)
Eos: 4 %
Hematocrit: 39 % (ref 37.5–51.0)
Hemoglobin: 13.5 g/dL (ref 13.0–17.7)
Immature Grans (Abs): 0 10*3/uL (ref 0.0–0.1)
Immature Granulocytes: 0 %
Lymphocytes Absolute: 1.4 10*3/uL (ref 0.7–3.1)
Lymphs: 19 %
MCH: 33.3 pg — ABNORMAL HIGH (ref 26.6–33.0)
MCHC: 34.6 g/dL (ref 31.5–35.7)
MCV: 96 fL (ref 79–97)
Monocytes Absolute: 0.7 10*3/uL (ref 0.1–0.9)
Monocytes: 10 %
Neutrophils Absolute: 4.8 10*3/uL (ref 1.4–7.0)
Neutrophils: 67 %
Platelets: 176 10*3/uL (ref 150–450)
RBC: 4.05 x10E6/uL — ABNORMAL LOW (ref 4.14–5.80)
RDW: 11.7 % (ref 11.6–15.4)
WBC: 7.2 10*3/uL (ref 3.4–10.8)

## 2021-10-09 LAB — CMP14+EGFR
ALT: 8 IU/L (ref 0–44)
AST: 15 IU/L (ref 0–40)
Albumin/Globulin Ratio: 2.1 (ref 1.2–2.2)
Albumin: 4.4 g/dL (ref 3.6–4.6)
Alkaline Phosphatase: 64 IU/L (ref 44–121)
BUN/Creatinine Ratio: 14 (ref 10–24)
BUN: 19 mg/dL (ref 8–27)
Bilirubin Total: 1 mg/dL (ref 0.0–1.2)
CO2: 25 mmol/L (ref 20–29)
Calcium: 9.2 mg/dL (ref 8.6–10.2)
Chloride: 96 mmol/L (ref 96–106)
Creatinine, Ser: 1.36 mg/dL — ABNORMAL HIGH (ref 0.76–1.27)
Globulin, Total: 2.1 g/dL (ref 1.5–4.5)
Glucose: 142 mg/dL — ABNORMAL HIGH (ref 70–99)
Potassium: 4.4 mmol/L (ref 3.5–5.2)
Sodium: 134 mmol/L (ref 134–144)
Total Protein: 6.5 g/dL (ref 6.0–8.5)
eGFR: 50 mL/min/{1.73_m2} — ABNORMAL LOW (ref >59)

## 2021-10-09 LAB — LIPID PANEL
Chol/HDL Ratio: 2.4 ratio (ref 0.0–5.0)
Cholesterol, Total: 87 mg/dL — ABNORMAL LOW (ref 100–199)
HDL: 36 mg/dL — ABNORMAL LOW (ref >39)
LDL Chol Calc (NIH): 33 mg/dL (ref 0–99)
Triglycerides: 89 mg/dL (ref 0–149)
VLDL Cholesterol Cal: 18 mg/dL (ref 5–40)

## 2021-10-09 LAB — PSA, TOTAL AND FREE
PSA, Free Pct: 42 %
PSA, Free: 0.21 ng/mL
Prostate Specific Ag, Serum: 0.5 ng/mL (ref 0.0–4.0)

## 2021-10-20 ENCOUNTER — Other Ambulatory Visit: Payer: Self-pay | Admitting: Family Medicine

## 2021-11-01 ENCOUNTER — Other Ambulatory Visit: Payer: Self-pay | Admitting: Family Medicine

## 2021-11-04 DIAGNOSIS — B351 Tinea unguium: Secondary | ICD-10-CM | POA: Diagnosis not present

## 2021-11-04 DIAGNOSIS — L84 Corns and callosities: Secondary | ICD-10-CM | POA: Diagnosis not present

## 2021-11-04 DIAGNOSIS — E1142 Type 2 diabetes mellitus with diabetic polyneuropathy: Secondary | ICD-10-CM | POA: Diagnosis not present

## 2021-11-04 DIAGNOSIS — M79676 Pain in unspecified toe(s): Secondary | ICD-10-CM | POA: Diagnosis not present

## 2022-01-02 NOTE — Patient Instructions (Signed)
[name] , Thank you for taking time to come for your Medicare Wellness Visit. I appreciate your ongoing commitment to your health goals. Please review the following plan we discussed and let me know if I can assist you in the future.   Screening recommendations/referrals: Colonoscopy: No longer required Recommended yearly ophthalmology/optometry visit for glaucoma screening and checkup Recommended yearly dental visit for hygiene and checkup  Vaccinations: Influenza vaccine: Done 09/11/2020 - repeat in fall* Pneumococcal vaccine: Done 08/15/2013 & 04/22/2015  Tdap vaccine: Done 04/16/2011 - Repeat in 10 years *due Shingles vaccine: Zostavax Done 2015 - due for Shingrix which is 2 doses 2-6 months apart and over 90% effective  *declined Covid-19: Done  08/16/2019, 09/13/2019, 06/05/2020, & 12/04/2020  Advanced directives: Advance directive discussed with you today. Even though you declined this today, please call our office should you change your mind, and we can give you the proper paperwork for you to fill out.   Conditions/risks identified: Aim for 30 minutes of exercise or brisk walking, 6-8 glasses of water, and 5 servings of fruits and vegetables each day.   Next appointment: Follow up in one year for your annual wellness visit.   Preventive Care 86 Years and Older, Male  Preventive care refers to lifestyle choices and visits with your health care provider that can promote health and wellness. What does preventive care include? A yearly physical exam. This is also called an annual well check. Dental exams once or twice a year. Routine eye exams. Ask your health care provider how often you should have your eyes checked. Personal lifestyle choices, including: Daily care of your teeth and gums. Regular physical activity. Eating a healthy diet. Avoiding tobacco and drug use. Limiting alcohol use. Practicing safe sex. Taking low doses of aspirin every day. Taking vitamin and mineral  supplements as recommended by your health care provider. What happens during an annual well check? The services and screenings done by your health care provider during your annual well check will depend on your age, overall health, lifestyle risk factors, and family history of disease. Counseling  Your health care provider may ask you questions about your: Alcohol use. Tobacco use. Drug use. Emotional well-being. Home and relationship well-being. Sexual activity. Eating habits. History of falls. Memory and ability to understand (cognition). Work and work Statistician. Screening  You may have the following tests or measurements: Height, weight, and BMI. Blood pressure. Lipid and cholesterol levels. These may be checked every 5 years, or more frequently if you are over 72 years old. Skin check. Lung cancer screening. You may have this screening every year starting at age 20 if you have a 30-pack-year history of smoking and currently smoke or have quit within the past 15 years. Fecal occult blood test (FOBT) of the stool. You may have this test every year starting at age 70. Flexible sigmoidoscopy or colonoscopy. You may have a sigmoidoscopy every 5 years or a colonoscopy every 10 years starting at age 64. Prostate cancer screening. Recommendations will vary depending on your family history and other risks. Hepatitis C blood test. Hepatitis B blood test. Sexually transmitted disease (STD) testing. Diabetes screening. This is done by checking your blood sugar (glucose) after you have not eaten for a while (fasting). You may have this done every 1-3 years. Abdominal aortic aneurysm (AAA) screening. You may need this if you are a current or former smoker. Osteoporosis. You may be screened starting at age 15 if you are at high risk. Talk with  your health care provider about your test results, treatment options, and if necessary, the need for more tests. Vaccines  Your health care provider  may recommend certain vaccines, such as: Influenza vaccine. This is recommended every year. Tetanus, diphtheria, and acellular pertussis (Tdap, Td) vaccine. You may need a Td booster every 10 years. Zoster vaccine. You may need this after age 57. Pneumococcal 13-valent conjugate (PCV13) vaccine. One dose is recommended after age 72. Pneumococcal polysaccharide (PPSV23) vaccine. One dose is recommended after age 48. Talk to your health care provider about which screenings and vaccines you need and how often you need them. This information is not intended to replace advice given to you by your health care provider. Make sure you discuss any questions you have with your health care provider. Document Released: 08/16/2015 Document Revised: 04/08/2016 Document Reviewed: 05/21/2015 Elsevier Interactive Patient Education  2017 [place] Prevention in the Home Falls can cause injuries. They can happen to people of all ages. There are many things you can do to make your home safe and to help prevent falls. What can I do on the outside of my home? Regularly fix the edges of walkways and driveways and fix any cracks. Remove anything that might make you trip as you walk through a door, such as a raised step or threshold. Trim any bushes or trees on the path to your home. Use bright outdoor lighting. Clear any walking paths of anything that might make someone trip, such as rocks or tools. Regularly check to see if handrails are loose or broken. Make sure that both sides of any steps have handrails. Any raised decks and porches should have guardrails on the edges. Have any leaves, snow, or ice cleared regularly. Use sand or salt on walking paths during winter. Clean up any spills in your garage right away. This includes oil or grease spills. What can I do in the bathroom? Use night lights. Install grab bars by the toilet and in the tub and shower. Do not use towel bars as grab bars. Use  non-skid mats or decals in the tub or shower. If you need to sit down in the shower, use a plastic, non-slip stool. Keep the floor dry. Clean up any water that spills on the floor as soon as it happens. Remove soap buildup in the tub or shower regularly. Attach bath mats securely with double-sided non-slip rug tape. Do not have throw rugs and other things on the floor that can make you trip. What can I do in the bedroom? Use night lights. Make sure that you have a light by your bed that is easy to reach. Do not use any sheets or blankets that are too big for your bed. They should not hang down onto the floor. Have a firm chair that has side arms. You can use this for support while you get dressed. Do not have throw rugs and other things on the floor that can make you trip. What can I do in the kitchen? Clean up any spills right away. Avoid walking on wet floors. Keep items that you use a lot in easy-to-reach places. If you need to reach something above you, use a strong step stool that has a grab bar. Keep electrical cords out of the way. Do not use floor polish or wax that makes floors slippery. If you must use wax, use non-skid floor wax. Do not have throw rugs and other things on the floor that can make you trip.  What can I do with my stairs? Do not leave any items on the stairs. Make sure that there are handrails on both sides of the stairs and use them. Fix handrails that are broken or loose. Make sure that handrails are as long as the stairways. Check any carpeting to make sure that it is firmly attached to the stairs. Fix any carpet that is loose or worn. Avoid having throw rugs at the top or bottom of the stairs. If you do have throw rugs, attach them to the floor with carpet tape. Make sure that you have a light switch at the top of the stairs and the bottom of the stairs. If you do not have them, ask someone to add them for you. What else can I do to help prevent falls? Wear  shoes that: Do not have high heels. Have rubber bottoms. Are comfortable and fit you well. Are closed at the toe. Do not wear sandals. If you use a stepladder: Make sure that it is fully opened. Do not climb a closed stepladder. Make sure that both sides of the stepladder are locked into place. Ask someone to hold it for you, if possible. Clearly mark and make sure that you can see: Any grab bars or handrails. First and last steps. Where the edge of each step is. Use tools that help you move around (mobility aids) if they are needed. These include: Canes. Walkers. Scooters. Crutches. Turn on the lights when you go into a dark area. Replace any light bulbs as soon as they burn out. Set up your furniture so you have a clear path. Avoid moving your furniture around. If any of your floors are uneven, fix them. If there are any pets around you, be aware of where they are. Review your medicines with your doctor. Some medicines can make you feel dizzy. This can increase your chance of falling. Ask your doctor what other things that you can do to help prevent falls. This information is not intended to replace advice given to you by your health care provider. Make sure you discuss any questions you have with your health care provider. Document Released: 05/16/2009 Document Revised: 12/26/2015 Document Reviewed: 08/24/2014 Elsevier Interactive Patient Education  2017 [employer].

## 2022-01-05 ENCOUNTER — Telehealth: Payer: Self-pay

## 2022-01-05 ENCOUNTER — Ambulatory Visit (INDEPENDENT_AMBULATORY_CARE_PROVIDER_SITE_OTHER): Payer: Medicare Other

## 2022-01-05 VITALS — Wt 196.0 lb

## 2022-01-05 DIAGNOSIS — Z0000 Encounter for general adult medical examination without abnormal findings: Secondary | ICD-10-CM

## 2022-01-05 DIAGNOSIS — E1159 Type 2 diabetes mellitus with other circulatory complications: Secondary | ICD-10-CM

## 2022-01-05 NOTE — Telephone Encounter (Signed)
Since decreasing Metformin to one daily, BS is running about 150 every morning fasting. He wants to know if you think he should go back to 2 daily? Thank you.

## 2022-01-05 NOTE — Progress Notes (Signed)
Subjective:   [name] is a 88 y.o. male who presents for Medicare Annual/Subsequent preventive examination.  Virtual Visit via Telephone Note  I connected with  [name] on 01/05/22 at  8:15 AM EDT by telephone and verified that I am speaking with the correct person using two identifiers.  Location: Patient: Home Provider: [facility] Persons participating in the virtual visit: patient/Nurse Health Advisor   I discussed the limitations, risks, security and privacy concerns of performing an evaluation and management service by telephone and the availability of in person appointments. The patient expressed understanding and agreed to proceed.  Interactive audio and video telecommunications were attempted between this nurse and patient, however failed, due to patient having technical difficulties OR patient did not have access to video capability.  We continued and completed visit with audio only.  Some vital signs may be absent or patient reported.    E , LPN   Review of Systems     Cardiac Risk Factors include: advanced age (>4mn, >>53women);diabetes mellitus;dyslipidemia;hypertension;male gender;sedentary lifestyle;Other (see comment), Risk factor comments: atherosclerosis, hx of CVA, malnutrition, A.Fib, PVD     Objective:    Today's Vitals   01/05/22 0816  Weight: 196 lb (88.9 kg)  PainSc: 4    Body mass index is 27.34 kg/m.     01/05/2022    8:26 AM 01/02/2021    8:33 AM 12/11/2019    9:32 AM 06/27/2019    8:02 PM 12/02/2018    9:04 AM 11/08/2017    1:44 PM 04/21/2017    5:00 PM  Advanced Directives  Does Patient Have a Medical Advance Directive? _0  No No  Would patient like information on creating a medical advance directive? No - Patient declined No - Patient declined No - Patient declined  Yes (MAU/Ambulatory/Procedural Areas - Information given)  No - Patient declined    Current Medications (verified) Outpatient Encounter Medications as  of 01/05/2022  Medication Sig   Accu-Chek FastClix Lancets MISC Check BS up to 4 TIMES DAILY Dx E11.9   acetaminophen (TYLENOL) 325 MG tablet Take 650 mg by mouth every 6 (six) hours as needed for mild pain, fever or headache.   ADVAIR DISKUS 100-50 MCG/ACT AEPB INHALE 1 DOSE BY MOUTH EVERY 12 HOURS   albuterol (VENTOLIN HFA) 108 (90 Base) MCG/ACT inhaler INHALE 1 TO 2 PUFFS BY MOUTH EVERY 6 HOURS AS NEEDED FOR WHEEZING FOR SHORTNESS OF BREATH   atorvastatin (LIPITOR) 80 MG tablet Take 1 tablet (80 mg total) by mouth daily.   benazepril (LOTENSIN) 20 MG tablet Take 1 tablet (20 mg total) by mouth daily.   blood glucose meter kit and supplies KIT Dispense based on patient and insurance preference. Use four times daily as directed.  Diagnosis type 2 diabetes   Cholecalciferol (VITAMIN D) 2000 units CAPS Take 2,000 Units by mouth daily.   feeding supplement, GLUCERNA SHAKE, (GLUCERNA SHAKE) LIQD Take 237 mLs by mouth 2 (two) times daily between meals.   fish oil-omega-3 fatty acids 1000 MG capsule Take 2 g by mouth every evening.   glucose blood (ACCU-CHEK GUIDE) test strip Check BS up to 4 TIMES DAILY Dx E11.9   hydrALAZINE (APRESOLINE) 25 MG tablet Take 1 tablet (25 mg total) by mouth daily as needed.   hydrochlorothiazide (HYDRODIURIL) 25 MG tablet Take 1 tablet (25 mg total) by mouth daily.   metFORMIN (GLUCOPHAGE-XR) 500 MG 24 hr tablet Take 1 tablet (500 mg total) by mouth daily  with breakfast.   metoprolol tartrate (LOPRESSOR) 100 MG tablet Take 1 tablet (100 mg total) by mouth 2 (two) times daily.   nitroGLYCERIN (NITROSTAT) 0.4 MG SL tablet USE AS DIRECTED   pantoprazole (PROTONIX) 40 MG tablet Take 1 tablet (40 mg total) by mouth daily.   rivaroxaban (XARELTO) 20 MG TABS tablet Take 1 tablet (20 mg total) by mouth daily with supper.   No facility-administered encounter medications on file as of 01/05/2022.    Allergies (verified) Alteplase, Codeine, Lopid [gemfibrozil], and Voltaren  [diclofenac sodium]   History: Past Medical History:  Diagnosis Date   Arthritis    "wrists, knees, back" (12/10/2015)   Asthma    BCC (basal cell carcinoma of skin) 01/28/2017   left inner cheek--sup & nod (CX3, 5FU)   BCC (basal cell carcinoma) 01/26/2017   left chin (molts)   BCC (basal cell carcinoma) 04/01/2017   midback--sup & nod (tx p bx)   BCC (basal cell carcinoma) 10/13/2017   above left outer eye brow--infiltrative (CX3, 5FU)   BCC (basal cell carcinoma) 06/02/2018   right post forearm--nod (tx p bx)   BCC (basal cell carcinoma) 03/09/2019   right mid outer cheek--nod--(tx p bx)   Cerebral atherosclerosis    Chronic back pain    "lower primarily" (12/10/2015)   CORONARY ATHEROSCLEROSIS NATIVE CORONARY ARTERY    Myocardial infarction at [facility] in 1996 with a directional atherectomy of the 90% LAD stenosis and medical management of 60-70% proximal circumflex and 50% RCA stenosis.   DYSLIPIDEMIA    GERD (gastroesophageal reflux disease)    History of hiatal hernia    HYPERTENSION    ILIAC ARTERY ANEURYSM    Myocardial infarction ([place]) 1996   SCC (squamous cell carcinoma) 10/29/2016   left ear--in situ (tx p bx)   SCC (squamous cell carcinoma) 01/26/2017   left sideburn--well diff--(CX3, 5FU)   SCC (squamous cell carcinoma) 01/28/2017   left upper cheek--in situ (CX3, 5FU)   SCC (squamous cell carcinoma) 05/06/2017   left cheek--sup &well diff (tx p bx)   SCC (squamous cell carcinoma) 10/13/2017   right outer forearm--in situ (CX3 5FU)   SCC (squamous cell carcinoma) 10/13/2017   right ouer forarm--well diff (CX3 5FU)   SCC (squamous cell carcinoma) 10/13/2017   left sideburn, superior--well diff (CX3, 5FU)   SCC (squamous cell carcinoma) 06/02/2018   right forearm--in situ (tx p bx)   SCC (squamous cell carcinoma) 03/09/2019   left zy goma--well diff (tx p bx)   SCC (squamous cell carcinoma) 03/09/2019   right pre auricular--well diff (tx p bx)   SCCA  (squamous cell carcinoma) of skin 05/22/2021   Left Buccal Cheek (in situ)   Stroke ([place]) 2003; 2013; 07/2015; 12/05/2015   denies residual on 12/10/2015, 12/13/16, 01/08/17   TIA (transient ischemic attack)    Type II diabetes mellitus ([place])    UPPER RESPIRATORY INFECTION    Past Surgical History:  Procedure Laterality Date   ANAL FISSURE REPAIR  1990s   BASAL CELL CARCINOMA EXCISION Right 12/2015   "temple"   CATARACT EXTRACTION W/ INTRAOCULAR LENS  IMPLANT, BILATERAL Bilateral 2000s   CORONARY ANGIOPLASTY  1996   KNEE CARTILAGE SURGERY Right 1970s   LOOP RECORDER INSERTION N/A 12/15/2016   Procedure: Loop Recorder Insertion;  Surgeon: Thompson Grayer, MD;  Location: [facility];  Service: Cardiovascular;  Laterality: N/A;   SHOULDER ARTHROSCOPY W/ ROTATOR CUFF REPAIR Left 2000s   TEE WITHOUT CARDIOVERSION N/A 12/15/2016  Procedure: TRANSESOPHAGEAL ECHOCARDIOGRAM (TEE);  Surgeon: Lelon Perla, MD;  Location: [facility];  Service: Cardiovascular;  Laterality: N/A;   Family History  Problem Relation Age of Onset   Coronary artery disease Mother    Cancer Father        lung   Stroke Father    Cancer Brother        lung   Stroke Sister    Social History   Socioeconomic History   Marital status: Married    Spouse name: Vera   Number of children: 3   Years of education: Not on file   Highest education level: 7th grade  Occupational History   Occupation: Retired    Comment: [employer]  Tobacco Use   Smoking status: Former    Packs/day: 0.12    Years: 45.00    Pack years: 5.40    Types: Cigarettes    Quit date: 11/24/1984    Years since quitting: 37.1   Smokeless tobacco: Former    Types: Snuff   Tobacco comments:    "dipped snuff when I was a boy"  Vaping Use   Vaping Use: Never used  Substance and Sexual Activity   Alcohol use: No    Alcohol/week: 0.0 standard drinks   Drug use: No   Sexual activity: Not Currently  Other Topics Concern   Not on  file  Social History Narrative   One level living home with wife, Vanita Ingles - she cooks every Sunday for the family of about 108 - children to great grandchildren   Social Determinants of Radio broadcast assistant Strain: Low Risk    Difficulty of Paying Living Expenses: Not hard at all  Food Insecurity: No Food Insecurity   Worried About Charity fundraiser in the Last Year: Never true   Arboriculturist in the Last Year: Never true  Transportation Needs: No Transportation Needs   Lack of Transportation (Medical): No   Lack of Transportation (Non-Medical): No  Physical Activity: Insufficiently Active   Days of Exercise per Week: 3 days   Minutes of Exercise per Session: 20 min  Stress: No Stress Concern Present   Feeling of Stress : Not at all  Social Connections: Socially Integrated   Frequency of Communication with Friends and Family: More than three times a week   Frequency of Social Gatherings with Friends and Family: More than three times a week   Attends Religious Services: More than 4 times per year   Active Member of [employer] or Organizations: Yes   Attends Music therapist: More than 4 times per year   Marital Status: Married    Tobacco Counseling Counseling given: Not Answered Tobacco comments: "dipped snuff when I was a boy"   Clinical Intake:  Pre-visit preparation completed: Yes  Pain : 0-10 Pain Score: 4  Pain Type: Chronic pain Pain Location: Generalized Pain Orientation: Right, Left Pain Descriptors / Indicators: Aching, Discomfort Pain Onset: More than a month ago Pain Frequency: Intermittent     BMI - recorded: 27.34 Nutritional Status: BMI 25 -29 Overweight Nutritional Risks: Other (Comment) (lack of appetite) Diabetes: Yes CBG done?: No Did pt. bring in CBG monitor from home?: No  How often do you need to have someone help you when you read instructions, pamphlets, or other written materials from your doctor or pharmacy?: 1 -  Never  Diabetic? Nutrition Risk Assessment:  Has the patient had any N/V/D within the last 2 months?  Lack of appetite lately Does the patient have any non-healing wounds?  No  Has the patient had any unintentional weight loss or weight gain?  No   Diabetes:  Is the patient diabetic?  Yes  If diabetic, was a CBG obtained today?  No  Did the patient bring in their glucometer from home?  No  How often do you monitor your CBG's? Once daily fasting - 152 this am per patient.   Financial Strains and Diabetes Management:  Are you having any financial strains with the device, your supplies or your medication? No .  Does the patient want to be seen by Chronic Care Management for management of their diabetes?  No  Would the patient like to be referred to a Nutritionist or for Diabetic Management?  No   Diabetic Exams:  Diabetic Eye Exam: Completed 01/04/2021. Due - he has appt coming up.  Diabetic Foot Exam: Completed 09/11/2020. Pt has been advised about the importance in completing this exam. Pt is scheduled for diabetic foot exam on next visit with PCP.    Interpreter Needed?: No  Information entered by ::  , LPN   Activities of Daily Living    01/05/2022    8:23 AM  In your present state of health, do you have any difficulty performing the following activities:  Hearing? 1  Comment has hearing aids  Vision? 0  Difficulty concentrating or making decisions? 0  Walking or climbing stairs? 1  Dressing or bathing? 0  Doing errands, shopping? 0  Preparing Food and eating ? N  Using the Toilet? N  In the past six months, have you accidently leaked urine? N  Do you have problems with loss of bowel control? N  Managing your Medications? N  Managing your Finances? N  Housekeeping or managing your Housekeeping? N    Patient Care Team: Dettinger, Fransisca Kaufmann, MD as PCP - General (Family Medicine) Minus Breeding, MD as PCP - Cardiology (Cardiology) Lavonna Monarch, MD as  Consulting Physician (Dermatology) Clinger, Chrystie Nose, MD (Otolaryngology) Minus Breeding, MD as Consulting Physician (Cardiology) Ilean China, RN as Registered Nurse [place] Comment, Allison Quarry, MD as Referring Physician (Optometry) Steffanie Rainwater, DPM as Consulting Physician (Podiatry)  Indicate any recent Medical Services you may have received from other than [facility] providers in the past year (date may be approximate).     Assessment:   This is a routine wellness examination for [place].  Hearing/Vision screen Hearing Screening - Comments:: Has hearing aids, but usually doesn't wear them Vision Screening - Comments:: Wears rx glasses - up to date with routine eye exams with    Dietary issues and exercise activities discussed: Current Exercise Habits: Home exercise routine, Type of exercise: walking;Other - see comments (stationary bike), Time (Minutes): 20, Frequency (Times/Week): 3, Weekly Exercise (Minutes/Week): 60, Intensity: Mild, Exercise limited by: orthopedic condition(s);respiratory conditions(s);cardiac condition(s)   Goals Addressed             This Visit's Progress    DIET - EAT MORE VEGETABLES   Not on track    Increase vegetable intake to 2-3 servings per day.      Patient Stated   On track    01/05/2022 AWV Goal: Fall Prevention  Over the next year, patient will decrease their risk for falls by: Using assistive devices, such as a cane or walker, as needed Identifying fall risks within their home and correcting them by: Removing throw rugs Adding handrails to stairs or ramps Removing clutter and  keeping a clear pathway throughout the home Increasing light, especially at night Adding shower handles/bars Raising toilet seat Identifying potential personal risk factors for falls: Medication side effects Incontinence/urgency Vestibular dysfunction Hearing loss Musculoskeletal disorders Neurological disorders Orthostatic hypotension         Depression Screen     01/05/2022    8:22 AM 10/08/2021    9:15 AM 04/10/2021   12:59 PM 01/02/2021    8:25 AM 09/11/2020    9:11 AM 06/06/2020    8:23 AM 03/06/2020    8:17 AM  PHQ 2/9 Scores  PHQ - 2 Score 0 0 0 0 0 0 0  PHQ- 9 Score  1         Fall Risk    01/05/2022    8:20 AM 10/08/2021    9:15 AM 04/10/2021   12:58 PM 01/02/2021    8:33 AM 09/11/2020    9:11 AM  Fall Risk   Falls in the past year? 0 0 0 0 0  Number falls in past yr: 0   0   Injury with Fall? 0   0   Risk for fall due to : Impaired balance/gait;Orthopedic patient   Impaired balance/gait;Orthopedic patient;Impaired vision   Follow up Education provided;Falls prevention discussed   Education provided;Falls prevention discussed     FALL RISK PREVENTION PERTAINING TO THE HOME:  Any stairs in or around the home? No  If so, are there any without handrails? No  Home free of loose throw rugs in walkways, pet beds, electrical cords, etc? Yes  Adequate lighting in your home to reduce risk of falls? Yes   ASSISTIVE DEVICES UTILIZED TO PREVENT FALLS:  Life alert? No  Use of a cane, walker or w/c? Yes  Grab bars in the bathroom? No  Shower chair or bench in shower? Yes  Elevated toilet seat or a handicapped toilet? No   TIMED UP AND GO:  Was the test performed? No . Telephonic visit  Cognitive Function:    11/08/2017    2:12 PM 04/22/2015    9:04 AM  MMSE - Mini Mental State Exam  Orientation to time 5 5  Orientation to Place 5 5  Registration 3 3  Attention/ Calculation 3 3  Recall 0 2  Language- name 2 objects 2 2  Language- repeat 1 1  Language- follow 3 step command 3 3  Language- read & follow direction 1 1  Write a sentence 1 1  Copy design 0 0  Total score 24 26        01/05/2022    8:25 AM 12/11/2019    9:37 AM 12/02/2018    9:08 AM  6CIT Screen  What Year? 0 points 0 points 0 points  What month? 0 points 0 points 3 points  What time? 0 points 0 points 0 points  Count back from 20 0 points 0 points 2 points  Months in reverse  4 points 0 points 0 points  Repeat phrase 2 points 4 points 0 points  Total Score 6 points 4 points 5 points    Immunizations Immunization History  Administered Date(s) Administered   Fluad Quad(high Dose 65+) 05/10/2019, 09/11/2020   Influenza Inj Mdck Quad Pf 05/10/2019   Influenza, High Dose Seasonal PF 06/10/2016, 07/20/2018   Influenza,inj,Quad PF,6+ Mos 08/15/2013, 05/02/2014, 05/23/2015, 05/18/2017   Moderna Sars-Covid-2 Vaccination 08/16/2019, 09/13/2019, 06/05/2020, 12/04/2020   Pneumococcal Conjugate-13 08/15/2013   Pneumococcal Polysaccharide-23 04/22/2015   Td 04/16/2011   Tdap 11/22/2010, 04/13/2011  Zoster, Live 09/13/2013    TDAP status: Due, Education has been provided regarding the importance of this vaccine. Advised may receive this vaccine at local pharmacy or Health Dept. Aware to provide a copy of the vaccination record if obtained from local pharmacy or Health Dept. Verbalized acceptance and understanding.  Flu Vaccine status: Due, Education has been provided regarding the importance of this vaccine. Advised may receive this vaccine at local pharmacy or Health Dept. Aware to provide a copy of the vaccination record if obtained from local pharmacy or Health Dept. Verbalized acceptance and understanding.  Pneumococcal vaccine status: Up to date  Covid-19 vaccine status: Completed vaccines  Qualifies for Shingles Vaccine? Yes   Zostavax completed Yes   Shingrix Completed?: No.    Education has been provided regarding the importance of this vaccine. Patient has been advised to call insurance company to determine out of pocket expense if they have not yet received this vaccine. Advised may also receive vaccine at local pharmacy or Health Dept. Verbalized acceptance and understanding.  Screening Tests Health Maintenance  Topic Date Due   Zoster Vaccines- Shingrix (1 of 2) Never done   COVID-19 Vaccine (5 - Booster for Moderna series) 01/29/2021   TETANUS/TDAP   04/15/2021   OPHTHALMOLOGY EXAM  06/17/2021   FOOT EXAM  09/11/2021   INFLUENZA VACCINE  03/03/2022   HEMOGLOBIN A1C  04/10/2022   Pneumonia Vaccine 13+ Years old  Completed   HPV VACCINES  Aged Out    Health Maintenance  Health Maintenance Due  Topic Date Due   Zoster Vaccines- Shingrix (1 of 2) Never done   COVID-19 Vaccine (5 - Booster for Moderna series) 01/29/2021   TETANUS/TDAP  04/15/2021   OPHTHALMOLOGY EXAM  06/17/2021   FOOT EXAM  09/11/2021    Colorectal cancer screening: No longer required.   Lung Cancer Screening: (Low Dose CT Chest recommended if Age 36-80 years, 30 pack-year currently smoking OR have quit w/in 15years.) does not qualify.   Additional Screening:  Hepatitis C Screening: does not qualify  Vision Screening: Recommended annual ophthalmology exams for early detection of glaucoma and other disorders of the eye. Is the patient up to date with their annual eye exam?  Yes  Who is the provider or what is the name of the office in which the patient attends annual eye exams? [facility] If pt is not established with a provider, would they like to be referred to a provider to establish care? No .   Dental Screening: Recommended annual dental exams for proper oral hygiene  Community Resource Referral / Chronic Care Management: CRR required this visit?  No   CCM required this visit?  No      Plan:     I have personally reviewed and noted the following in the patient's chart:   Medical and social history Use of alcohol, tobacco or illicit drugs  Current medications and supplements including opioid prescriptions. Patient is not currently taking opioid prescriptions. Functional ability and status Nutritional status Physical activity Advanced directives List of other physicians Hospitalizations, surgeries, and ER visits in previous 12 months Vitals Screenings to include cognitive, depression, and falls Referrals and appointments  In  addition, I have reviewed and discussed with patient certain preventive protocols, quality metrics, and best practice recommendations. A written personalized care plan for preventive services as well as general preventive health recommendations were provided to patient.     Sandrea Hammond, LPN   12/10/4583   Nurse Notes:  poor appetite, but making himself eat and drinking Glcerna - Since decreasing Metformin to one daily, BS is running about 150 every morning fasting.

## 2022-01-05 NOTE — Telephone Encounter (Signed)
I would like to recheck the kidney numbers before increasing the metformin again, please have him come in and check a basic metabolic panel and if it has returned back to normal or improved then we can increase the metformin again.

## 2022-01-05 NOTE — Telephone Encounter (Signed)
Pt has been informed. Future BMP ordered. He will come 6/6 to have drawn.

## 2022-01-06 ENCOUNTER — Other Ambulatory Visit: Payer: Medicare Other

## 2022-01-06 DIAGNOSIS — E1159 Type 2 diabetes mellitus with other circulatory complications: Secondary | ICD-10-CM

## 2022-01-06 LAB — BMP8+EGFR
BUN/Creatinine Ratio: 14 (ref 10–24)
BUN: 17 mg/dL (ref 8–27)
CO2: 22 mmol/L (ref 20–29)
Calcium: 9.3 mg/dL (ref 8.6–10.2)
Chloride: 96 mmol/L (ref 96–106)
Creatinine, Ser: 1.25 mg/dL (ref 0.76–1.27)
Glucose: 167 mg/dL — ABNORMAL HIGH (ref 70–99)
Potassium: 4.2 mmol/L (ref 3.5–5.2)
Sodium: 134 mmol/L (ref 134–144)
eGFR: 55 mL/min/{1.73_m2} — ABNORMAL LOW (ref >59)

## 2022-01-06 NOTE — Progress Notes (Signed)
Cardiology Office Note   Date:  01/10/2022   ID:  [name], [name] Nov 20, 1929, MRN [id number]  PCP:  Dettinger, Fransisca Kaufmann, MD  Cardiologist:   Minus Breeding, MD   Chief Complaint  Patient presents with   Shortness of Breath      History of Present Illness: [name] is a 88 y.o. male who presents for followup of his known coronary disease.  He had a previous TIA  He had a loop recorder placed.  He did have a TEE demonstrating a small PFO. Doppler was negative for DVT.  MRI demonstrated small acute infarcts in the MCA distribution. He had an implanted loop monitor.  He was found to have atrial fib and was started on Xarelto.    Since I last saw him he has had some increasing shortness of breath.  He is limited somewhat by knee problems.  However, he also tries to do activities like occasionally riding a stationary bike although his wife (of 60 years) says he does not do this often.  He says he does get some increasing dyspnea with exertion but he is not having any shortness of breath at rest.  He is not having any presyncope or syncope.  He has no chest pressure, neck or arm discomfort.  He said no weight gain or edema.\   Past Medical History:  Diagnosis Date   Arthritis    "wrists, knees, back" (12/10/2015)   Asthma    BCC (basal cell carcinoma of skin) 01/28/2017   left inner cheek--sup & nod (CX3, 5FU)   BCC (basal cell carcinoma) 01/26/2017   left chin (molts)   BCC (basal cell carcinoma) 04/01/2017   midback--sup & nod (tx p bx)   BCC (basal cell carcinoma) 10/13/2017   above left outer eye brow--infiltrative (CX3, 5FU)   BCC (basal cell carcinoma) 06/02/2018   right post forearm--nod (tx p bx)   BCC (basal cell carcinoma) 03/09/2019   right mid outer cheek--nod--(tx p bx)   Cerebral atherosclerosis    Chronic back pain    "lower primarily" (12/10/2015)   CORONARY ATHEROSCLEROSIS NATIVE CORONARY ARTERY    Myocardial infarction at [facility] in 1996 with a  directional atherectomy of the 90% LAD stenosis and medical management of 60-70% proximal circumflex and 50% RCA stenosis.   DYSLIPIDEMIA    GERD (gastroesophageal reflux disease)    History of hiatal hernia    HYPERTENSION    ILIAC ARTERY ANEURYSM    Myocardial infarction ([place]) 1996   SCC (squamous cell carcinoma) 10/29/2016   left ear--in situ (tx p bx)   SCC (squamous cell carcinoma) 01/26/2017   left sideburn--well diff--(CX3, 5FU)   SCC (squamous cell carcinoma) 01/28/2017   left upper cheek--in situ (CX3, 5FU)   SCC (squamous cell carcinoma) 05/06/2017   left cheek--sup &well diff (tx p bx)   SCC (squamous cell carcinoma) 10/13/2017   right outer forearm--in situ (CX3 5FU)   SCC (squamous cell carcinoma) 10/13/2017   right ouer forarm--well diff (CX3 5FU)   SCC (squamous cell carcinoma) 10/13/2017   left sideburn, superior--well diff (CX3, 5FU)   SCC (squamous cell carcinoma) 06/02/2018   right forearm--in situ (tx p bx)   SCC (squamous cell carcinoma) 03/09/2019   left zy goma--well diff (tx p bx)   SCC (squamous cell carcinoma) 03/09/2019   right pre auricular--well diff (tx p bx)   SCCA (squamous cell carcinoma) of skin 05/22/2021   Left Buccal Cheek (in situ)  Stroke [facility]) 2003; 2013; 07/2015; 12/05/2015   denies residual on 12/10/2015, 12/13/16, 01/08/17   TIA (transient ischemic attack)    Type II diabetes mellitus ([place])    UPPER RESPIRATORY INFECTION     Past Surgical History:  Procedure Laterality Date   ANAL FISSURE REPAIR  1990s   BASAL CELL CARCINOMA EXCISION Right 12/2015   "temple"   CATARACT EXTRACTION W/ INTRAOCULAR LENS  IMPLANT, BILATERAL Bilateral 2000s   CORONARY ANGIOPLASTY  1996   KNEE CARTILAGE SURGERY Right 1970s   LOOP RECORDER INSERTION N/A 12/15/2016   Procedure: Loop Recorder Insertion;  Surgeon: Thompson Grayer, MD;  Location: [place] [facility];  Service: Cardiovascular;  Laterality: N/A;   SHOULDER ARTHROSCOPY W/ ROTATOR CUFF REPAIR Left 2000s    TEE WITHOUT CARDIOVERSION N/A 12/15/2016   Procedure: TRANSESOPHAGEAL ECHOCARDIOGRAM (TEE);  Surgeon: Lelon Perla, MD;  Location: [facility] ENDOSCOPY;  Service: Cardiovascular;  Laterality: N/A;     Current Outpatient Medications  Medication Sig Dispense Refill   Accu-Chek FastClix Lancets MISC Check BS up to 4 TIMES DAILY Dx E11.9 408 each 3   acetaminophen (TYLENOL) 325 MG tablet Take 650 mg by mouth every 6 (six) hours as needed for mild pain, fever or headache.     ADVAIR DISKUS 100-50 MCG/ACT AEPB INHALE 1 DOSE BY MOUTH EVERY 12 HOURS 60 each 5   albuterol (VENTOLIN HFA) 108 (90 Base) MCG/ACT inhaler INHALE 1 TO 2 PUFFS BY MOUTH EVERY 6 HOURS AS NEEDED FOR WHEEZING FOR SHORTNESS OF BREATH 27 g 3   atorvastatin (LIPITOR) 80 MG tablet Take 1 tablet (80 mg total) by mouth daily. 90 tablet 3   benazepril (LOTENSIN) 20 MG tablet Take 1 tablet (20 mg total) by mouth daily. 90 tablet 3   blood glucose meter kit and supplies KIT Dispense based on patient and insurance preference. Use four times daily as directed.  Diagnosis type 2 diabetes 1 each 0   Cholecalciferol (VITAMIN D) 2000 units CAPS Take 2,000 Units by mouth daily.     feeding supplement, GLUCERNA SHAKE, (GLUCERNA SHAKE) LIQD Take 237 mLs by mouth 2 (two) times daily between meals. 60 Can 11   fish oil-omega-3 fatty acids 1000 MG capsule Take 2 g by mouth every evening.     glucose blood (ACCU-CHEK GUIDE) test strip Check BS up to 4 TIMES DAILY Dx E11.9 400 each 3   hydrALAZINE (APRESOLINE) 25 MG tablet Take 1 tablet (25 mg total) by mouth daily as needed. 90 tablet 3   hydrochlorothiazide (HYDRODIURIL) 25 MG tablet Take 1 tablet (25 mg total) by mouth daily. 90 tablet 3   metFORMIN (GLUCOPHAGE-XR) 500 MG 24 hr tablet Take 1 tablet (500 mg total) by mouth daily with breakfast. 180 tablet 3   metoprolol tartrate (LOPRESSOR) 100 MG tablet Take 1 tablet (100 mg total) by mouth 2 (two) times daily. 180 tablet 3   nitroGLYCERIN (NITROSTAT)  0.4 MG SL tablet USE AS DIRECTED 25 tablet 1   pantoprazole (PROTONIX) 40 MG tablet Take 1 tablet (40 mg total) by mouth daily. 90 tablet 3   rivaroxaban (XARELTO) 20 MG TABS tablet Take 1 tablet (20 mg total) by mouth daily with supper. 90 tablet 3   No current facility-administered medications for this visit.    Allergies:   Alteplase, Codeine, Lopid [gemfibrozil], and Voltaren [diclofenac sodium]    ROS:  Please see the history of present illness.   Otherwise, review of systems are positive for none.   All other systems  are reviewed and negative.    PHYSICAL EXAM: VS:  BP (!) 150/80   Pulse 64   Ht $R'5\' 11"'iT$  (1.803 m)   Wt 191 lb 12.8 oz (87 kg)   SpO2 96%   BMI 26.75 kg/m  , BMI Body mass index is 26.75 kg/m. GENERAL:  Well appearing NECK:  No jugular venous distention, waveform within normal limits, carotid upstroke brisk and symmetric, no bruits, no thyromegaly LUNGS:  Clear to auscultation bilaterally CHEST:  Unremarkable HEART:  PMI not displaced or sustained,S1 and S2 within normal limits, no S3, no S4, no clicks, no rubs, 2 out of 6 apical systolic murmur early to mid peaking, no diastolic murmurs ABD:  Flat, positive bowel sounds normal in frequency in pitch, no bruits, no rebound, no guarding, no midline pulsatile mass, no hepatomegaly, no splenomegaly EXT:  2 plus pulses throughout, no edema, no cyanosis no clubbing   EKG:  EKG is  ordered today. The ekg ordered today demonstrates sinus rhythm, rate 64, axis within normal limits, intervals within normal limits, no acute ST-T wave changes.  Premature ectopic complexes   Recent Labs: 10/08/2021: ALT 8; Hemoglobin 13.5; Platelets 176 01/06/2022: BUN 17; Creatinine, Ser 1.25; Potassium 4.2; Sodium 134    Lipid Panel    Component Value Date/Time   CHOL 87 (L) 10/08/2021 0920   CHOL 132 02/02/2013 0853   TRIG 89 10/08/2021 0920   TRIG 123 02/08/2015 1009   TRIG 172 (H) 02/02/2013 0853   HDL 36 (L) 10/08/2021 0920    HDL 41 02/08/2015 1009   HDL 33 (L) 02/02/2013 0853   CHOLHDL 2.4 10/08/2021 0920   CHOLHDL 2.7 04/22/2017 0158   VLDL 12 04/22/2017 0158   LDLCALC 33 10/08/2021 0920   LDLCALC 49 05/02/2014 0934   LDLCALC 65 02/02/2013 0853      Wt Readings from Last 3 Encounters:  01/07/22 191 lb 12.8 oz (87 kg)  01/05/22 196 lb (88.9 kg)  10/08/21 183 lb (83 kg)      Other studies Reviewed: Additional studies/ records that were reviewed today include: Echo 2018.  Labs. Review of the above records demonstrates:  Please see elsewhere in the note.     ASSESSMENT AND PLAN:    TIA:   He is on Xarelto.  No change in therapy.   ATRIAL FIB:  [name] has a CHA2DS2 - VASc score of 6.  He has had no symptomatic paroxysms.  He is tolerating anticoagulation.  PFO: This was an incidental finding.  No change in therapy.  CAD:   He has had no anginal symptoms.  He will continue with risk reduction.  HTN:   The blood pressure is well controlled.  No change in therapy.   HYPERLIPIDEMIA:    LDL is 33 with an HDL of 36.  No change in therapy.   DM:   His A1C was 5.9.  No change in therapy.   SOB: See below  AS:  The patient had mild AS in 2019.  He has increasing shortness of breath.  I will check an echocardiogram.  Current medicines are reviewed at length with the patient today.  The patient does not have concerns regarding medicines.  The following changes have been made: None  Labs/ tests ordered today include:   Orders Placed This Encounter  Procedures   EKG 12-Lead   ECHOCARDIOGRAM COMPLETE     Disposition:   FU with me in 12 months.    Signed, Minus Breeding,  MD  01/10/2022 9:36 AM    [facility]

## 2022-01-07 ENCOUNTER — Encounter: Payer: Self-pay | Admitting: Cardiology

## 2022-01-07 ENCOUNTER — Ambulatory Visit: Payer: Medicare Other | Admitting: Cardiology

## 2022-01-07 VITALS — BP 150/80 | HR 64 | Ht 71.0 in | Wt 191.8 lb

## 2022-01-07 DIAGNOSIS — E785 Hyperlipidemia, unspecified: Secondary | ICD-10-CM

## 2022-01-07 DIAGNOSIS — I10 Essential (primary) hypertension: Secondary | ICD-10-CM | POA: Diagnosis not present

## 2022-01-07 DIAGNOSIS — G459 Transient cerebral ischemic attack, unspecified: Secondary | ICD-10-CM | POA: Diagnosis not present

## 2022-01-07 DIAGNOSIS — I480 Paroxysmal atrial fibrillation: Secondary | ICD-10-CM

## 2022-01-07 DIAGNOSIS — I2510 Atherosclerotic heart disease of native coronary artery without angina pectoris: Secondary | ICD-10-CM | POA: Diagnosis not present

## 2022-01-07 NOTE — Patient Instructions (Signed)
Medication Instructions:  The current medical regimen is effective;  continue present plan and medications.  *If you need a refill on your cardiac medications before your next appointment, please call your pharmacy*  Testing/Procedures: Your physician has requested that you have an echocardiogram. Echocardiography is a painless test that uses sound waves to create images of your heart. It provides your doctor with information about the size and shape of your heart and how well your heart's chambers and valves are working. This procedure takes approximately one hour. There are no restrictions for this procedure.   Follow-Up: At [facility], you and your health needs are our priority.  As part of our continuing mission to provide you with exceptional heart care, we have created designated Provider Care Teams.  These Care Teams include your primary Cardiologist (physician) and Advanced Practice Providers (APPs -  Physician Assistants and Nurse Practitioners) who all work together to provide you with the care you need, when you need it.  We recommend signing up for the patient portal called "MyChart".  Sign up information is provided on this After Visit Summary.  MyChart is used to connect with patients for Virtual Visits (Telemedicine).  Patients are able to view lab/test results, encounter notes, upcoming appointments, etc.  Non-urgent messages can be sent to your provider as well.   To learn more about what you can do with MyChart, go to [email or web address].    Your next appointment:   1 year(s)  The format for your next appointment:   In Person  Provider:   Dr Minus Breeding  Important Information About Sugar

## 2022-01-08 NOTE — Progress Notes (Signed)
Patient returning call. Please call back

## 2022-01-10 ENCOUNTER — Encounter: Payer: Self-pay | Admitting: Cardiology

## 2022-01-13 DIAGNOSIS — M79676 Pain in unspecified toe(s): Secondary | ICD-10-CM | POA: Diagnosis not present

## 2022-01-13 DIAGNOSIS — L84 Corns and callosities: Secondary | ICD-10-CM | POA: Diagnosis not present

## 2022-01-13 DIAGNOSIS — B351 Tinea unguium: Secondary | ICD-10-CM | POA: Diagnosis not present

## 2022-01-13 DIAGNOSIS — E1142 Type 2 diabetes mellitus with diabetic polyneuropathy: Secondary | ICD-10-CM | POA: Diagnosis not present

## 2022-01-20 ENCOUNTER — Ambulatory Visit: Payer: Medicare Other | Admitting: Dermatology

## 2022-01-20 DIAGNOSIS — Z85828 Personal history of other malignant neoplasm of skin: Secondary | ICD-10-CM | POA: Diagnosis not present

## 2022-01-20 DIAGNOSIS — Z1283 Encounter for screening for malignant neoplasm of skin: Secondary | ICD-10-CM

## 2022-01-20 DIAGNOSIS — L570 Actinic keratosis: Secondary | ICD-10-CM | POA: Diagnosis not present

## 2022-01-20 NOTE — Patient Instructions (Signed)

## 2022-01-29 ENCOUNTER — Other Ambulatory Visit (HOSPITAL_COMMUNITY): Payer: Medicare Other

## 2022-02-04 ENCOUNTER — Ambulatory Visit (HOSPITAL_COMMUNITY): Payer: Medicare Other | Attending: Cardiology

## 2022-02-04 DIAGNOSIS — I2510 Atherosclerotic heart disease of native coronary artery without angina pectoris: Secondary | ICD-10-CM | POA: Diagnosis not present

## 2022-02-04 DIAGNOSIS — G459 Transient cerebral ischemic attack, unspecified: Secondary | ICD-10-CM | POA: Insufficient documentation

## 2022-02-04 DIAGNOSIS — I480 Paroxysmal atrial fibrillation: Secondary | ICD-10-CM | POA: Insufficient documentation

## 2022-02-04 LAB — ECHOCARDIOGRAM COMPLETE
AR max vel: 0.73 cm2
AV Area VTI: 0.74 cm2
AV Area mean vel: 0.75 cm2
AV Mean grad: 30 mmHg
AV Peak grad: 49.8 mmHg
Ao pk vel: 3.53 m/s
Area-P 1/2: 3.93 cm2
P 1/2 time: 398 msec
S' Lateral: 3.3 cm

## 2022-02-09 DIAGNOSIS — R0602 Shortness of breath: Secondary | ICD-10-CM | POA: Insufficient documentation

## 2022-02-09 DIAGNOSIS — I350 Nonrheumatic aortic (valve) stenosis: Secondary | ICD-10-CM | POA: Insufficient documentation

## 2022-02-09 NOTE — Progress Notes (Unsigned)
Cardiology Office Note   Date:  02/11/2022   ID:  IRAN ROWE, DOB 09-26-1929, MRN [id number]  PCP:  Dettinger, Elige Radon, MD  Cardiologist:   Rollene Rotunda, MD   Chief Complaint  Patient presents with   Aortic Stenosis      History of Present Illness: IMRI LOR is a 88 y.o. male who presents for followup of his known coronary disease.  He had a previous TIA  He had a loop recorder placed.  He did have a TEE demonstrating a small PFO. Doppler was negative for DVT.  MRI demonstrated small acute infarcts in the MCA distribution. He had an implanted loop monitor.  He was found to have atrial fib and was started on Xarelto.    Since I last saw him I sent him for an echo and this appears to be severe with heavy calcification.  He was having increased SOB and is back to discuss possible TAVR.  He reports that he is not having significant dyspnea with usual exertion though he is more short of breath as previously mentioned.  He gets around with a cane.  He has had no new chest pressure, neck or arm discomfort.  He has had no weight gain or edema.  Past Medical History:  Diagnosis Date   Arthritis    "wrists, knees, back" (12/10/2015)   Asthma    BCC (basal cell carcinoma of skin) 01/28/2017   left inner cheek--sup & nod (CX3, 5FU)   BCC (basal cell carcinoma) 01/26/2017   left chin (molts)   BCC (basal cell carcinoma) 04/01/2017   midback--sup & nod (tx p bx)   BCC (basal cell carcinoma) 10/13/2017   above left outer eye brow--infiltrative (CX3, 5FU)   BCC (basal cell carcinoma) 06/02/2018   right post forearm--nod (tx p bx)   BCC (basal cell carcinoma) 03/09/2019   right mid outer cheek--nod--(tx p bx)   Cerebral atherosclerosis    Chronic back pain    "lower primarily" (12/10/2015)   CORONARY ATHEROSCLEROSIS NATIVE CORONARY ARTERY    Myocardial infarction at [facility] in 1996 with a directional atherectomy of the 90% LAD stenosis and medical management of 60-70%  proximal circumflex and 50% RCA stenosis.   DYSLIPIDEMIA    GERD (gastroesophageal reflux disease)    History of hiatal hernia    HYPERTENSION    ILIAC ARTERY ANEURYSM    Myocardial infarction ([facility]) 1996   SCC (squamous cell carcinoma) 10/29/2016   left ear--in situ (tx p bx)   SCC (squamous cell carcinoma) 01/26/2017   left sideburn--well diff--(CX3, 5FU)   SCC (squamous cell carcinoma) 01/28/2017   left upper cheek--in situ (CX3, 5FU)   SCC (squamous cell carcinoma) 05/06/2017   left cheek--sup &well diff (tx p bx)   SCC (squamous cell carcinoma) 10/13/2017   right outer forearm--in situ (CX3 5FU)   SCC (squamous cell carcinoma) 10/13/2017   right ouer forarm--well diff (CX3 5FU)   SCC (squamous cell carcinoma) 10/13/2017   left sideburn, superior--well diff (CX3, 5FU)   SCC (squamous cell carcinoma) 06/02/2018   right forearm--in situ (tx p bx)   SCC (squamous cell carcinoma) 03/09/2019   left zy goma--well diff (tx p bx)   SCC (squamous cell carcinoma) 03/09/2019   right pre auricular--well diff (tx p bx)   SCCA (squamous cell carcinoma) of skin 05/22/2021   Left Buccal Cheek (in situ)   Stroke ([facility]) 2003; 2013; 07/2015; 12/05/2015   denies residual on 12/10/2015, 12/13/16, 01/08/17  TIA (transient ischemic attack)    Type II diabetes mellitus ([place])    UPPER RESPIRATORY INFECTION     Past Surgical History:  Procedure Laterality Date   ANAL FISSURE REPAIR  1990s   BASAL CELL CARCINOMA EXCISION Right 12/2015   "temple"   CATARACT EXTRACTION W/ INTRAOCULAR LENS  IMPLANT, BILATERAL Bilateral 2000s   CORONARY ANGIOPLASTY  1996   KNEE CARTILAGE SURGERY Right 1970s   LOOP RECORDER INSERTION N/A 12/15/2016   Procedure: Loop Recorder Insertion;  Surgeon: Thompson Grayer, MD;  Location: [place] [facility];  Service: Cardiovascular;  Laterality: N/A;   SHOULDER ARTHROSCOPY W/ ROTATOR CUFF REPAIR Left 2000s   TEE WITHOUT CARDIOVERSION N/A 12/15/2016   Procedure: TRANSESOPHAGEAL  ECHOCARDIOGRAM (TEE);  Surgeon: Lelon Perla, MD;  Location: [facility] ENDOSCOPY;  Service: Cardiovascular;  Laterality: N/A;     Current Outpatient Medications  Medication Sig Dispense Refill   acetaminophen (TYLENOL) 325 MG tablet Take 650 mg by mouth every 6 (six) hours as needed for mild pain, fever or headache.     ADVAIR DISKUS 100-50 MCG/ACT AEPB INHALE 1 DOSE BY MOUTH EVERY 12 HOURS 60 each 5   albuterol (VENTOLIN HFA) 108 (90 Base) MCG/ACT inhaler INHALE 1 TO 2 PUFFS BY MOUTH EVERY 6 HOURS AS NEEDED FOR WHEEZING FOR SHORTNESS OF BREATH 27 g 3   atorvastatin (LIPITOR) 80 MG tablet Take 1 tablet (80 mg total) by mouth daily. 90 tablet 3   benazepril (LOTENSIN) 20 MG tablet Take 1 tablet (20 mg total) by mouth daily. 90 tablet 3   Cholecalciferol (VITAMIN D) 2000 units CAPS Take 2,000 Units by mouth daily.     feeding supplement, GLUCERNA SHAKE, (GLUCERNA SHAKE) LIQD Take 237 mLs by mouth 2 (two) times daily between meals. 60 Can 11   fish oil-omega-3 fatty acids 1000 MG capsule Take 2 g by mouth every evening.     hydrALAZINE (APRESOLINE) 25 MG tablet Take 1 tablet (25 mg total) by mouth daily as needed. 90 tablet 3   hydrochlorothiazide (HYDRODIURIL) 25 MG tablet Take 1 tablet (25 mg total) by mouth daily. 90 tablet 3   metFORMIN (GLUCOPHAGE-XR) 500 MG 24 hr tablet Take 1 tablet (500 mg total) by mouth daily with breakfast. 180 tablet 3   metoprolol tartrate (LOPRESSOR) 100 MG tablet Take 1 tablet (100 mg total) by mouth 2 (two) times daily. 180 tablet 3   nitroGLYCERIN (NITROSTAT) 0.4 MG SL tablet USE AS DIRECTED 25 tablet 1   pantoprazole (PROTONIX) 40 MG tablet Take 1 tablet (40 mg total) by mouth daily. 90 tablet 3   rivaroxaban (XARELTO) 20 MG TABS tablet Take 1 tablet (20 mg total) by mouth daily with supper. 90 tablet 3   Accu-Chek FastClix Lancets MISC Check BS up to 4 TIMES DAILY Dx E11.9 408 each 3   blood glucose meter kit and supplies KIT Dispense based on patient and  insurance preference. Use four times daily as directed.  Diagnosis type 2 diabetes 1 each 0   glucose blood (ACCU-CHEK GUIDE) test strip Check BS up to 4 TIMES DAILY Dx E11.9 400 each 3   No current facility-administered medications for this visit.    Allergies:   Alteplase, Codeine, Lopid [gemfibrozil], and Voltaren [diclofenac sodium]    ROS:  Please see the history of present illness.   Otherwise, review of systems are positive for none.   All other systems are reviewed and negative.    PHYSICAL EXAM: VS:  BP 140/70   Pulse  76   Ht $R'5\' 11"'Jw$  (1.803 m)   Wt 190 lb (86.2 kg)   BMI 26.50 kg/m  , BMI Body mass index is 26.5 kg/m. GENERAL:  Well appearing NECK:  No jugular venous distention, waveform within normal limits, carotid upstroke brisk and symmetric, no bruits, no thyromegaly LUNGS:  Clear to auscultation bilaterally CHEST:  Unremarkable HEART:  PMI not displaced or sustained,S1 and S2 within normal limits, no S3, no S4, no clicks, no rubs, 3 out of 6 apical systolic murmur radiating slightly at aortic outflow tract and mid peaking, no diastolic murmurs ABD:  Flat, positive bowel sounds normal in frequency in pitch, no bruits, no rebound, no guarding, no midline pulsatile mass, no hepatomegaly, no splenomegaly EXT:  2 plus pulses throughout, no edema, no cyanosis no clubbing   EKG:  EKG is not ordered today.  omplexes   Recent Labs: 10/08/2021: ALT 8; Hemoglobin 13.5; Platelets 176 01/06/2022: BUN 17; Creatinine, Ser 1.25; Potassium 4.2; Sodium 134    Lipid Panel    Component Value Date/Time   CHOL 87 (L) 10/08/2021 0920   CHOL 132 02/02/2013 0853   TRIG 89 10/08/2021 0920   TRIG 123 02/08/2015 1009   TRIG 172 (H) 02/02/2013 0853   HDL 36 (L) 10/08/2021 0920   HDL 41 02/08/2015 1009   HDL 33 (L) 02/02/2013 0853   CHOLHDL 2.4 10/08/2021 0920   CHOLHDL 2.7 04/22/2017 0158   VLDL 12 04/22/2017 0158   LDLCALC 33 10/08/2021 0920   LDLCALC 49 05/02/2014 0934   LDLCALC  65 02/02/2013 0853      Wt Readings from Last 3 Encounters:  02/11/22 190 lb (86.2 kg)  01/07/22 191 lb 12.8 oz (87 kg)  01/05/22 196 lb (88.9 kg)      Other studies Reviewed: Additional studies/ records that were reviewed today include: None Review of the above records demonstrates:  NA.     ASSESSMENT AND PLAN:    AS:   I had a long discussion with the patient and his wife about this.  He has not had frequent hospitalizations or any resting symptoms.  He is probably is limited in his joint pains as he is in his breathing.  He would prefer to manage this conservatively and not consider TAVR.  I think this is a reasonable decision.  No change in therapy . TIA:   He has had no new symptoms.  He is on Xarelto.  No change in therapy.   ATRIAL FIB:  Mr. ITHIEL LIEBLER has a CHA2DS2 - VASc score of 6.  He has had no symptomatic paroxysms.  No change in therapy.  He is tolerating anticoagulation.    PFO:   This was an incidental finding.  No change in therapy.  CAD:   He has had no new symptoms.  No change in therapy.  HTN:   The blood pressure is at target.  No change in therapy.  I did review some blood pressures from home and were all lower than today.  HYPERLIPIDEMIA:    LDL is 33 with an HDL of 36.  No change in therapy.   DM:   His A1C was 5.9 but his blood sugars at home have been elevated.  He has had his metformin decrease and I asked him to talk this over with  Dettinger, Fransisca Kaufmann, MD    Current medicines are reviewed at length with the patient today.  The patient does not have concerns regarding medicines.  The following  changes have been made: None  Labs/ tests ordered today include:   No orders of the defined types were placed in this encounter.    Disposition:   FU with me in 12 months.    Signed, Minus Breeding, MD  02/11/2022 9:46 AM    [facility]

## 2022-02-11 ENCOUNTER — Ambulatory Visit: Payer: Self-pay | Admitting: *Deleted

## 2022-02-11 ENCOUNTER — Ambulatory Visit: Payer: Medicare Other | Admitting: Cardiology

## 2022-02-11 ENCOUNTER — Encounter: Payer: Self-pay | Admitting: Cardiology

## 2022-02-11 VITALS — BP 140/70 | HR 76 | Ht 71.0 in | Wt 190.0 lb

## 2022-02-11 DIAGNOSIS — R0602 Shortness of breath: Secondary | ICD-10-CM

## 2022-02-11 DIAGNOSIS — I480 Paroxysmal atrial fibrillation: Secondary | ICD-10-CM

## 2022-02-11 DIAGNOSIS — I350 Nonrheumatic aortic (valve) stenosis: Secondary | ICD-10-CM | POA: Diagnosis not present

## 2022-02-11 DIAGNOSIS — I10 Essential (primary) hypertension: Secondary | ICD-10-CM

## 2022-02-11 DIAGNOSIS — E785 Hyperlipidemia, unspecified: Secondary | ICD-10-CM | POA: Diagnosis not present

## 2022-02-11 DIAGNOSIS — E118 Type 2 diabetes mellitus with unspecified complications: Secondary | ICD-10-CM

## 2022-02-11 NOTE — Chronic Care Management (AMB) (Signed)
  Chronic Care Management   Note  02/11/2022 Name: [name] MRN: [id number] DOB: 04/18/1930   Patient has not recently engaged with the Chronic Care Management RN Care Manager. Removing RN Care Manager from Care Team and closing Marie. If patient is currently engaged with another CCM team member I will forward this encounter to inform them of my case closure. Patient may be eligible for re-engagement with RN Care Manager in the future if necessary and can discuss this with their PCP.  Chong Sicilian, BSN, RN-BC Embedded Chronic Care Manager [facility] Direct Dial: [phone]

## 2022-02-11 NOTE — Patient Instructions (Signed)
[name]  I have enjoyed working with you through the Chronic Care Management Program at [facility]. Due to program changes and no recent contact with the [facility], I am removing myself from your care team. If you are currently active with another CCM Team Member, you will remain active with them unless they reach out to you with additional information. If you feel that you need RN Care Management services in the future, please talk with your primary care provider to discuss re-engagement with the RN Care Manager.   Thank you for allowing me to participate in your your healthcare journey.  Chong Sicilian, BSN, RN-BC Embedded Chronic Care Manager [facility] Direct Dial: [phone]

## 2022-02-11 NOTE — Patient Instructions (Signed)

## 2022-02-14 ENCOUNTER — Other Ambulatory Visit: Payer: Self-pay | Admitting: Family Medicine

## 2022-02-15 ENCOUNTER — Encounter: Payer: Self-pay | Admitting: Dermatology

## 2022-02-15 NOTE — Progress Notes (Signed)
   Follow-Up Visit   Subjective  [name] is a 88 y.o. male who presents for the following: Annual Exam (Patient here today for yearly skin check, no concerns. Personal history of non mole skin cancer. No family history of atypical moles, melanoma or non mole skin cancer. ).  Skin check, some facial crusts Location:  Duration:  Quality:  Associated Signs/Symptoms: Modifying Factors:  Severity:  Timing: Context:   Objective  Well appearing patient in no apparent distress; mood and affect are within normal limits. Waist up skin examination: No recurrent nonmelanoma skin cancer, no atypical pigmented spots.  Multiple actinic keratoses which are historically stable and not bothersome to patient.  Head - Anterior (Face) Half dozen gritty 2 to 4 mm pink crusts, none of which are historically changing or currently bothersome to patient    All skin waist up examined.   Assessment & Plan    AK (actinic keratosis) Head - Anterior (Face)  Intervention deferred unless there is clinical change  Encounter for screening for malignant neoplasm of skin  Annual skin examination      I, Lavonna Monarch, MD, have reviewed all documentation for this visit.  The documentation on 02/15/22 for the exam, diagnosis, procedures, and orders are all accurate and complete.

## 2022-03-03 DIAGNOSIS — H6123 Impacted cerumen, bilateral: Secondary | ICD-10-CM | POA: Diagnosis not present

## 2022-03-03 DIAGNOSIS — H903 Sensorineural hearing loss, bilateral: Secondary | ICD-10-CM | POA: Diagnosis not present

## 2022-03-03 DIAGNOSIS — J343 Hypertrophy of nasal turbinates: Secondary | ICD-10-CM | POA: Diagnosis not present

## 2022-03-03 DIAGNOSIS — J329 Chronic sinusitis, unspecified: Secondary | ICD-10-CM | POA: Diagnosis not present

## 2022-03-03 DIAGNOSIS — J342 Deviated nasal septum: Secondary | ICD-10-CM | POA: Diagnosis not present

## 2022-03-03 DIAGNOSIS — J328 Other chronic sinusitis: Secondary | ICD-10-CM | POA: Diagnosis not present

## 2022-03-03 DIAGNOSIS — Z87891 Personal history of nicotine dependence: Secondary | ICD-10-CM | POA: Diagnosis not present

## 2022-03-24 DIAGNOSIS — L84 Corns and callosities: Secondary | ICD-10-CM | POA: Diagnosis not present

## 2022-03-24 DIAGNOSIS — B351 Tinea unguium: Secondary | ICD-10-CM | POA: Diagnosis not present

## 2022-03-24 DIAGNOSIS — M79676 Pain in unspecified toe(s): Secondary | ICD-10-CM | POA: Diagnosis not present

## 2022-03-24 DIAGNOSIS — E1142 Type 2 diabetes mellitus with diabetic polyneuropathy: Secondary | ICD-10-CM | POA: Diagnosis not present

## 2022-04-09 ENCOUNTER — Ambulatory Visit (INDEPENDENT_AMBULATORY_CARE_PROVIDER_SITE_OTHER): Payer: Medicare Other | Admitting: Family Medicine

## 2022-04-09 ENCOUNTER — Encounter: Payer: Self-pay | Admitting: Family Medicine

## 2022-04-09 DIAGNOSIS — E1159 Type 2 diabetes mellitus with other circulatory complications: Secondary | ICD-10-CM | POA: Diagnosis not present

## 2022-04-09 DIAGNOSIS — I152 Hypertension secondary to endocrine disorders: Secondary | ICD-10-CM | POA: Diagnosis not present

## 2022-04-09 DIAGNOSIS — E785 Hyperlipidemia, unspecified: Secondary | ICD-10-CM | POA: Diagnosis not present

## 2022-04-09 DIAGNOSIS — I480 Paroxysmal atrial fibrillation: Secondary | ICD-10-CM

## 2022-04-09 DIAGNOSIS — E1169 Type 2 diabetes mellitus with other specified complication: Secondary | ICD-10-CM

## 2022-04-09 LAB — BAYER DCA HB A1C WAIVED: HB A1C (BAYER DCA - WAIVED): 6.4 % — ABNORMAL HIGH (ref 4.8–5.6)

## 2022-04-09 MED ORDER — HYDRALAZINE HCL 25 MG PO TABS
25.0000 mg | ORAL_TABLET | Freq: Every day | ORAL | 3 refills | Status: DC | PRN
Start: 2022-04-09 — End: 2023-01-14

## 2022-04-09 MED ORDER — BENAZEPRIL HCL 20 MG PO TABS: 20.0000 mg | ORAL_TABLET | Freq: Every day | ORAL | 3 refills | Status: DC

## 2022-04-09 MED ORDER — RIVAROXABAN 20 MG PO TABS: 20.0000 mg | ORAL_TABLET | Freq: Every day | ORAL | 3 refills | Status: DC

## 2022-04-09 MED ORDER — METFORMIN HCL ER 500 MG PO TB24: 500.0000 mg | ORAL_TABLET | Freq: Every day | ORAL | 3 refills | Status: DC

## 2022-04-09 MED ORDER — ATORVASTATIN CALCIUM 80 MG PO TABS: 80.0000 mg | ORAL_TABLET | Freq: Every day | ORAL | 3 refills | Status: DC

## 2022-04-09 MED ORDER — PANTOPRAZOLE SODIUM 40 MG PO TBEC: 40.0000 mg | DELAYED_RELEASE_TABLET | Freq: Every day | ORAL | 3 refills | Status: DC

## 2022-04-09 MED ORDER — METOPROLOL TARTRATE 100 MG PO TABS: 100.0000 mg | ORAL_TABLET | Freq: Two times a day (BID) | ORAL | 3 refills | Status: DC

## 2022-04-09 NOTE — Progress Notes (Signed)
BP 120/79   Pulse 72   Temp 98 F (36.7 C)   Ht $R'5\' 11"'vV$  (1.803 m)   Wt 180 lb (81.6 kg)   SpO2 98%   BMI 25.10 kg/m    Subjective:   Patient ID: [name], male    DOB: [birth year], 88 y.o.   MRN: [id number]  HPI: [name] is a 88 y.o. male presenting on 04/09/2022 for Medical Management of Chronic Issues, Diabetes, Hypertension, and Hyperlipidemia   HPI Type 2 diabetes mellitus Patient comes in today for recheck of his diabetes. Patient has been currently taking metformin. Patient is currently on an ACE inhibitor/ARB. Patient has not seen an ophthalmologist this year. Patient denies any issues with their feet. The symptom started onset as an adult hypertension and A-fib and hyperlipidemia ARE RELATED TO DM   Hypertension and A-fib Patient is currently on benazepril hydralazine and metoprolol and hydrochlorothiazide, and their blood pressure today is 120/79. Patient denies any lightheadedness or dizziness. Patient denies headaches, blurred vision, chest pains, shortness of breath, or weakness. Denies any side effects from medication and is content with current medication.  Hyperlipidemia Patient is coming in for recheck of his hyperlipidemia. The patient is currently taking atorvastatin and fish oils. They deny any issues with myalgias or history of liver damage from it. They deny any focal numbness or weakness or chest pain.   Severe AS Patient is seeing cardiology for this.   Relevant past medical, surgical, family and social history reviewed and updated as indicated. Interim medical history since our last visit reviewed. Allergies and medications reviewed and updated.  Review of Systems  Constitutional:  Positive for fatigue. Negative for chills and fever.  Eyes:  Negative for visual disturbance.  Respiratory:  Negative for shortness of breath and wheezing.   Cardiovascular:  Negative for chest pain, palpitations and leg swelling.  Musculoskeletal:  Negative for  back pain and gait problem.  Skin:  Negative for rash.  Neurological:  Negative for weakness and light-headedness.  All other systems reviewed and are negative.   Per HPI unless specifically indicated above   Allergies as of 04/09/2022       Reactions   Alteplase Anaphylaxis   Codeine Other (See Comments)   Bad dreams   Lopid [gemfibrozil] Nausea Only   Voltaren [diclofenac Sodium] Diarrhea        Medication List        Accurate as of April 09, 2022  9:47 AM. If you have any questions, ask your nurse or doctor.          Accu-Chek FastClix Lancets Misc Check BS up to 4 TIMES DAILY Dx E11.9   Accu-Chek Guide test strip Generic drug: glucose blood Check BS up to 4 TIMES DAILY Dx E11.9   acetaminophen 325 MG tablet Commonly known as: TYLENOL Take 650 mg by mouth every 6 (six) hours as needed for mild pain, fever or headache.   Advair Diskus 100-50 MCG/ACT Aepb Generic drug: fluticasone-salmeterol INHALE 1 DOSE BY MOUTH EVERY 12 HOURS   albuterol 108 (90 Base) MCG/ACT inhaler Commonly known as: VENTOLIN HFA INHALE 1 TO 2 PUFFS BY MOUTH EVERY 6 HOURS AS NEEDED FOR WHEEZING FOR SHORTNESS OF BREATH   atorvastatin 80 MG tablet Commonly known as: LIPITOR Take 1 tablet (80 mg total) by mouth daily.   benazepril 20 MG tablet Commonly known as: LOTENSIN Take 1 tablet (20 mg total) by mouth daily.   blood glucose meter kit and supplies Kit  Dispense based on patient and insurance preference. Use four times daily as directed.  Diagnosis type 2 diabetes   feeding supplement (GLUCERNA SHAKE) Liqd Take 237 mLs by mouth 2 (two) times daily between meals.   fish oil-omega-3 fatty acids 1000 MG capsule Take 2 g by mouth every evening.   hydrALAZINE 25 MG tablet Commonly known as: APRESOLINE Take 1 tablet (25 mg total) by mouth daily as needed.   hydrochlorothiazide 25 MG tablet Commonly known as: HYDRODIURIL Take 1 tablet (25 mg total) by mouth daily.   metFORMIN  500 MG 24 hr tablet Commonly known as: GLUCOPHAGE-XR Take 1 tablet (500 mg total) by mouth daily with breakfast.   metoprolol tartrate 100 MG tablet Commonly known as: LOPRESSOR Take 1 tablet (100 mg total) by mouth 2 (two) times daily.   nitroGLYCERIN 0.4 MG SL tablet Commonly known as: NITROSTAT USE AS DIRECTED   pantoprazole 40 MG tablet Commonly known as: PROTONIX Take 1 tablet (40 mg total) by mouth daily.   rivaroxaban 20 MG Tabs tablet Commonly known as: Xarelto Take 1 tablet (20 mg total) by mouth daily with supper.   Vitamin D 50 MCG (2000 UT) Caps Take 2,000 Units by mouth daily.         Objective:   BP 120/79   Pulse 72   Temp 98 F (36.7 C)   Ht $R'5\' 11"'HD$  (1.803 m)   Wt 180 lb (81.6 kg)   SpO2 98%   BMI 25.10 kg/m   Wt Readings from Last 3 Encounters:  04/09/22 180 lb (81.6 kg)  02/11/22 190 lb (86.2 kg)  01/07/22 191 lb 12.8 oz (87 kg)    Physical Exam Vitals and nursing note reviewed.  Constitutional:      General: He is not in acute distress.    Appearance: He is well-developed. He is not diaphoretic.  Eyes:     General: No scleral icterus.    Conjunctiva/sclera: Conjunctivae normal.  Neck:     Thyroid: No thyromegaly.  Cardiovascular:     Rate and Rhythm: Normal rate and regular rhythm.     Heart sounds: Normal heart sounds. No murmur heard. Pulmonary:     Effort: Pulmonary effort is normal. No respiratory distress.     Breath sounds: Normal breath sounds. No wheezing.  Musculoskeletal:        General: No swelling. Normal range of motion.     Cervical back: Neck supple.  Lymphadenopathy:     Cervical: No cervical adenopathy.  Skin:    General: Skin is warm and dry.     Findings: No rash.  Neurological:     Mental Status: He is alert and oriented to person, place, and time.     Coordination: Coordination normal.  Psychiatric:        Behavior: Behavior normal.       Assessment & Plan:   Problem List Items Addressed This Visit        Cardiovascular and Mediastinum   Hypertension associated with diabetes ([place])   Relevant Medications   atorvastatin (LIPITOR) 80 MG tablet   benazepril (LOTENSIN) 20 MG tablet   hydrALAZINE (APRESOLINE) 25 MG tablet   metFORMIN (GLUCOPHAGE-XR) 500 MG 24 hr tablet   metoprolol tartrate (LOPRESSOR) 100 MG tablet   rivaroxaban (XARELTO) 20 MG TABS tablet   Paroxysmal atrial fibrillation (HCC)   Relevant Medications   atorvastatin (LIPITOR) 80 MG tablet   benazepril (LOTENSIN) 20 MG tablet   hydrALAZINE (APRESOLINE) 25 MG tablet  metoprolol tartrate (LOPRESSOR) 100 MG tablet   rivaroxaban (XARELTO) 20 MG TABS tablet     Endocrine   Hyperlipidemia associated with type 2 diabetes mellitus (HCC)   Relevant Medications   atorvastatin (LIPITOR) 80 MG tablet   benazepril (LOTENSIN) 20 MG tablet   hydrALAZINE (APRESOLINE) 25 MG tablet   metFORMIN (GLUCOPHAGE-XR) 500 MG 24 hr tablet   metoprolol tartrate (LOPRESSOR) 100 MG tablet   rivaroxaban (XARELTO) 20 MG TABS tablet   DM type 2 (diabetes mellitus, type 2) (HCC)   Relevant Medications   atorvastatin (LIPITOR) 80 MG tablet   benazepril (LOTENSIN) 20 MG tablet   metFORMIN (GLUCOPHAGE-XR) 500 MG 24 hr tablet   Other Relevant Orders   Bayer DCA Hb A1c Waived    A1c is 6.4, looks good, continue current medicine, no changes.  Blood pressure is good, no changes Follow up plan: Return in about 3 months (around 07/09/2022), or if symptoms worsen or fail to improve, for diabetes hypertension cholesterol.  Counseling provided for all of the vaccine components Orders Placed This Encounter  Procedures   Bayer [place] Hb A1c [place] Zaharah Amir, MD [facility] 04/09/2022, 9:47 AM

## 2022-04-17 ENCOUNTER — Other Ambulatory Visit: Payer: Self-pay | Admitting: Family Medicine

## 2022-05-15 ENCOUNTER — Other Ambulatory Visit: Payer: Self-pay | Admitting: Family Medicine

## 2022-05-15 DIAGNOSIS — E1159 Type 2 diabetes mellitus with other circulatory complications: Secondary | ICD-10-CM

## 2022-05-15 DIAGNOSIS — I152 Hypertension secondary to endocrine disorders: Secondary | ICD-10-CM

## 2022-05-27 DIAGNOSIS — L570 Actinic keratosis: Secondary | ICD-10-CM | POA: Diagnosis not present

## 2022-05-27 DIAGNOSIS — C4402 Squamous cell carcinoma of skin of lip: Secondary | ICD-10-CM | POA: Diagnosis not present

## 2022-05-27 DIAGNOSIS — D3701 Neoplasm of uncertain behavior of lip: Secondary | ICD-10-CM | POA: Diagnosis not present

## 2022-06-02 DIAGNOSIS — E1142 Type 2 diabetes mellitus with diabetic polyneuropathy: Secondary | ICD-10-CM | POA: Diagnosis not present

## 2022-06-02 DIAGNOSIS — B351 Tinea unguium: Secondary | ICD-10-CM | POA: Diagnosis not present

## 2022-06-02 DIAGNOSIS — M79676 Pain in unspecified toe(s): Secondary | ICD-10-CM | POA: Diagnosis not present

## 2022-06-02 DIAGNOSIS — L84 Corns and callosities: Secondary | ICD-10-CM | POA: Diagnosis not present

## 2022-06-08 ENCOUNTER — Other Ambulatory Visit: Payer: Self-pay | Admitting: Family Medicine

## 2022-06-19 ENCOUNTER — Other Ambulatory Visit: Payer: Self-pay | Admitting: Family Medicine

## 2022-07-01 DIAGNOSIS — C001 Malignant neoplasm of external lower lip: Secondary | ICD-10-CM | POA: Diagnosis not present

## 2022-07-02 DIAGNOSIS — L814 Other melanin hyperpigmentation: Secondary | ICD-10-CM | POA: Diagnosis not present

## 2022-07-02 DIAGNOSIS — Z85828 Personal history of other malignant neoplasm of skin: Secondary | ICD-10-CM | POA: Diagnosis not present

## 2022-07-02 DIAGNOSIS — D1801 Hemangioma of skin and subcutaneous tissue: Secondary | ICD-10-CM | POA: Diagnosis not present

## 2022-07-02 DIAGNOSIS — Z08 Encounter for follow-up examination after completed treatment for malignant neoplasm: Secondary | ICD-10-CM | POA: Diagnosis not present

## 2022-07-02 DIAGNOSIS — L821 Other seborrheic keratosis: Secondary | ICD-10-CM | POA: Diagnosis not present

## 2022-07-10 ENCOUNTER — Ambulatory Visit (INDEPENDENT_AMBULATORY_CARE_PROVIDER_SITE_OTHER): Payer: Medicare Other | Admitting: Family Medicine

## 2022-07-10 ENCOUNTER — Encounter: Payer: Self-pay | Admitting: Family Medicine

## 2022-07-10 VITALS — BP 138/71 | HR 64 | Temp 97.6°F | Ht 71.0 in | Wt 186.0 lb

## 2022-07-10 DIAGNOSIS — E1159 Type 2 diabetes mellitus with other circulatory complications: Secondary | ICD-10-CM | POA: Diagnosis not present

## 2022-07-10 DIAGNOSIS — I2510 Atherosclerotic heart disease of native coronary artery without angina pectoris: Secondary | ICD-10-CM

## 2022-07-10 DIAGNOSIS — E1169 Type 2 diabetes mellitus with other specified complication: Secondary | ICD-10-CM

## 2022-07-10 DIAGNOSIS — E785 Hyperlipidemia, unspecified: Secondary | ICD-10-CM | POA: Diagnosis not present

## 2022-07-10 DIAGNOSIS — I672 Cerebral atherosclerosis: Secondary | ICD-10-CM

## 2022-07-10 DIAGNOSIS — I152 Hypertension secondary to endocrine disorders: Secondary | ICD-10-CM

## 2022-07-10 LAB — CBC WITH DIFFERENTIAL/PLATELET
Basophils Absolute: 0 10*3/uL (ref 0.0–0.2)
Basos: 0 %
EOS (ABSOLUTE): 0.2 10*3/uL (ref 0.0–0.4)
Eos: 3 %
Hematocrit: 37.8 % (ref 37.5–51.0)
Hemoglobin: 13.2 g/dL (ref 13.0–17.7)
Immature Grans (Abs): 0 10*3/uL (ref 0.0–0.1)
Immature Granulocytes: 0 %
Lymphocytes Absolute: 1.6 10*3/uL (ref 0.7–3.1)
Lymphs: 22 %
MCH: 34 pg — ABNORMAL HIGH (ref 26.6–33.0)
MCHC: 34.9 g/dL (ref 31.5–35.7)
MCV: 97 fL (ref 79–97)
Monocytes Absolute: 0.7 10*3/uL (ref 0.1–0.9)
Monocytes: 10 %
Neutrophils Absolute: 4.6 10*3/uL (ref 1.4–7.0)
Neutrophils: 65 %
Platelets: 188 10*3/uL (ref 150–450)
RBC: 3.88 x10E6/uL — ABNORMAL LOW (ref 4.14–5.80)
RDW: 12.1 % (ref 11.6–15.4)
WBC: 7.1 10*3/uL (ref 3.4–10.8)

## 2022-07-10 LAB — CMP14+EGFR
ALT: 17 IU/L (ref 0–44)
AST: 20 IU/L (ref 0–40)
Albumin/Globulin Ratio: 1.8 (ref 1.2–2.2)
Albumin: 4.3 g/dL (ref 3.6–4.6)
Alkaline Phosphatase: 87 IU/L (ref 44–121)
BUN/Creatinine Ratio: 10 (ref 10–24)
BUN: 13 mg/dL (ref 10–36)
Bilirubin Total: 0.9 mg/dL (ref 0.0–1.2)
CO2: 23 mmol/L (ref 20–29)
Calcium: 8.9 mg/dL (ref 8.6–10.2)
Chloride: 93 mmol/L — ABNORMAL LOW (ref 96–106)
Creatinine, Ser: 1.33 mg/dL — ABNORMAL HIGH (ref 0.76–1.27)
Globulin, Total: 2.4 g/dL (ref 1.5–4.5)
Glucose: 164 mg/dL — ABNORMAL HIGH (ref 70–99)
Potassium: 4.2 mmol/L (ref 3.5–5.2)
Sodium: 132 mmol/L — ABNORMAL LOW (ref 134–144)
Total Protein: 6.7 g/dL (ref 6.0–8.5)
eGFR: 51 mL/min/{1.73_m2} — ABNORMAL LOW (ref >59)

## 2022-07-10 LAB — LIPID PANEL
Chol/HDL Ratio: 2 ratio (ref 0.0–5.0)
Cholesterol, Total: 81 mg/dL — ABNORMAL LOW (ref 100–199)
HDL: 40 mg/dL (ref >39)
LDL Chol Calc (NIH): 24 mg/dL (ref 0–99)
Triglycerides: 81 mg/dL (ref 0–149)
VLDL Cholesterol Cal: 17 mg/dL (ref 5–40)

## 2022-07-10 LAB — BAYER DCA HB A1C WAIVED: HB A1C (BAYER DCA - WAIVED): 6.6 % — ABNORMAL HIGH (ref 4.8–5.6)

## 2022-07-10 NOTE — Progress Notes (Signed)
BP 138/71   Pulse 64   Temp 97.6 F (36.4 C)   Ht _0  (1.803 m)   Wt 186 lb (84.4 kg)   SpO2 97%   BMI 25.94 kg/m    Subjective:   Patient ID: [name], male    DOB: [birth year], 88 y.o.   MRN: [id number]  HPI: [name] is a 88 y.o. male presenting on 07/10/2022 for Medical Management of Chronic Issues, Hyperlipidemia, Hypertension, and Diabetes   HPI Type 2 diabetes mellitus Patient comes in today for recheck of his diabetes. Patient has been currently taking metformin. Patient is currently on an ACE inhibitor/ARB. Patient has not seen an ophthalmologist this year. Patient denies any issues with their feet. The symptom started onset as an adult CAD and A-fib and hypertension and cholesterol ARE RELATED TO DM   Hypertension Patient is currently on benazepril and hydralazine and hydrochlorothiazide and metoprolol, and their blood pressure today is 138/71. Patient denies any lightheadedness or dizziness. Patient denies headaches, blurred vision, chest pains, shortness of breath, or weakness. Denies any side effects from medication and is content with current medication.   Hyperlipidemia and CAD and cerebral atherosclerosis with history of CVA Patient is coming in for recheck of his hyperlipidemia. The patient is currently taking atorvastatin and fish oil. They deny any issues with myalgias or history of liver damage from it. They deny any focal numbness or weakness or chest pain.   Relevant past medical, surgical, family and social history reviewed and updated as indicated. Interim medical history since our last visit reviewed. Allergies and medications reviewed and updated.  Review of Systems  Constitutional:  Negative for chills and fever.  Eyes:  Negative for visual disturbance.  Respiratory:  Negative for shortness of breath and wheezing.   Cardiovascular:  Negative for chest pain and leg swelling.  Musculoskeletal:  Negative for back pain and gait problem.   Skin:  Negative for rash.  Neurological:  Negative for dizziness, weakness and light-headedness.  All other systems reviewed and are negative.   Per HPI unless specifically indicated above   Allergies as of 07/10/2022       Reactions   Alteplase Anaphylaxis   Codeine Other (See Comments)   Bad dreams   Lopid [gemfibrozil] Nausea Only   Voltaren [diclofenac Sodium] Diarrhea        Medication List        Accurate as of July 10, 2022  8:58 AM. If you have any questions, ask your nurse or doctor.          Accu-Chek FastClix Lancets Misc Check BS up to 4 TIMES DAILY Dx E11.9   Accu-Chek Guide test strip Generic drug: glucose blood USE 1 STRIP TO CHECK GLUCOSE UP TO 4 TIMES DAILY   acetaminophen 325 MG tablet Commonly known as: TYLENOL Take 650 mg by mouth every 6 (six) hours as needed for mild pain, fever or headache.   Advair Diskus 100-50 MCG/ACT Aepb Generic drug: fluticasone-salmeterol INHALE 1 DOSE BY MOUTH EVERY 12 HOURS   albuterol 108 (90 Base) MCG/ACT inhaler Commonly known as: VENTOLIN HFA INHALE 1 TO 2 PUFFS BY MOUTH EVERY 6 HOURS AS NEEDED FOR WHEEZING FOR SHORTNESS OF BREATH   atorvastatin 80 MG tablet Commonly known as: LIPITOR Take 1 tablet (80 mg total) by mouth daily.   benazepril 20 MG tablet Commonly known as: LOTENSIN Take 1 tablet (20 mg total) by mouth daily.   blood glucose meter kit and supplies Kit  Dispense based on patient and insurance preference. Use four times daily as directed.  Diagnosis type 2 diabetes   feeding supplement (GLUCERNA SHAKE) Liqd Take 237 mLs by mouth 2 (two) times daily between meals.   fish oil-omega-3 fatty acids 1000 MG capsule Take 2 g by mouth every evening.   hydrALAZINE 25 MG tablet Commonly known as: APRESOLINE Take 1 tablet (25 mg total) by mouth daily as needed.   hydrochlorothiazide 25 MG tablet Commonly known as: HYDRODIURIL Take 1 tablet (25 mg total) by mouth daily.   metFORMIN 500  MG 24 hr tablet Commonly known as: GLUCOPHAGE-XR Take 1 tablet (500 mg total) by mouth daily with breakfast.   metoprolol tartrate 100 MG tablet Commonly known as: LOPRESSOR Take 1 tablet (100 mg total) by mouth 2 (two) times daily.   nitroGLYCERIN 0.4 MG SL tablet Commonly known as: NITROSTAT USE AS DIRECTED   pantoprazole 40 MG tablet Commonly known as: PROTONIX Take 1 tablet (40 mg total) by mouth daily.   rivaroxaban 20 MG Tabs tablet Commonly known as: Xarelto Take 1 tablet (20 mg total) by mouth daily with supper.   Vitamin D 50 MCG (2000 UT) Caps Take 2,000 Units by mouth daily.         Objective:   BP 138/71   Pulse 64   Temp 97.6 F (36.4 C)   Ht _0  (1.803 m)   Wt 186 lb (84.4 kg)   SpO2 97%   BMI 25.94 kg/m   Wt Readings from Last 3 Encounters:  07/10/22 186 lb (84.4 kg)  04/09/22 180 lb (81.6 kg)  02/11/22 190 lb (86.2 kg)    Physical Exam Vitals and nursing note reviewed.  Constitutional:      General: He is not in acute distress.    Appearance: He is well-developed. He is not diaphoretic.  Eyes:     General: No scleral icterus.    Conjunctiva/sclera: Conjunctivae normal.  Neck:     Thyroid: No thyromegaly.  Cardiovascular:     Rate and Rhythm: Normal rate and regular rhythm.     Heart sounds: Normal heart sounds. No murmur heard. Pulmonary:     Effort: Pulmonary effort is normal. No respiratory distress.     Breath sounds: Normal breath sounds. No wheezing.  Musculoskeletal:        General: No swelling. Normal range of motion.     Cervical back: Neck supple.  Lymphadenopathy:     Cervical: No cervical adenopathy.  Skin:    General: Skin is warm and dry.     Findings: No rash.  Neurological:     Mental Status: He is alert and oriented to person, place, and time.     Coordination: Coordination normal.  Psychiatric:        Behavior: Behavior normal.       Assessment & Plan:   Problem List Items Addressed This Visit        Cardiovascular and Mediastinum   Hypertension associated with diabetes ([place])   Relevant Orders   CBC with Differential/Platelet   CMP14+EGFR   Lipid panel   Bayer DCA Hb A1c Waived   CORONARY ATHEROSCLEROSIS NATIVE CORONARY ARTERY   Cerebral atherosclerosis     Endocrine   Hyperlipidemia associated with type 2 diabetes mellitus ([place]) - Primary   Relevant Orders   CBC with Differential/Platelet   CMP14+EGFR   Lipid panel   Bayer DCA Hb A1c Waived   DM type 2 (diabetes mellitus, type 2) ([place])  Relevant Orders   CBC with Differential/Platelet   CMP14+EGFR   Lipid panel   Bayer DCA Hb A1c Waived    A1c 6.6 which is essentially the same.  No changes, continue current medicine. Follow up plan: Return in about 3 months (around 10/09/2022), or if symptoms worsen or fail to improve, for Diabetes recheck.  Counseling provided for all of the vaccine components Orders Placed This Encounter  Procedures   CBC with Differential/Platelet   CMP14+EGFR   Lipid panel   Bayer DCA Hb A1c Waived    Caryl Pina, MD [facility] 07/10/2022, 8:58 AM

## 2022-07-29 ENCOUNTER — Other Ambulatory Visit: Payer: Self-pay | Admitting: Family Medicine

## 2022-07-29 DIAGNOSIS — E1159 Type 2 diabetes mellitus with other circulatory complications: Secondary | ICD-10-CM

## 2022-08-07 ENCOUNTER — Telehealth: Payer: Self-pay | Admitting: Family Medicine

## 2022-08-07 NOTE — Telephone Encounter (Signed)
Left message informing that Tylenol, Mucinex and Flonase would be alright to use. Advised to call back next week if no better.

## 2022-08-07 NOTE — Telephone Encounter (Signed)
Pts wife says that pt has a cold and wants to know what OTC medicine he can take for it? Says he takes Xarelto and she knows he can only take certain kinds of medicine with the medicine he already takes.

## 2022-08-08 DIAGNOSIS — R059 Cough, unspecified: Secondary | ICD-10-CM | POA: Diagnosis not present

## 2022-08-08 DIAGNOSIS — Z1152 Encounter for screening for COVID-19: Secondary | ICD-10-CM | POA: Diagnosis not present

## 2022-08-08 DIAGNOSIS — J9811 Atelectasis: Secondary | ICD-10-CM | POA: Diagnosis not present

## 2022-08-11 DIAGNOSIS — M79676 Pain in unspecified toe(s): Secondary | ICD-10-CM | POA: Diagnosis not present

## 2022-08-11 DIAGNOSIS — L84 Corns and callosities: Secondary | ICD-10-CM | POA: Diagnosis not present

## 2022-08-11 DIAGNOSIS — B351 Tinea unguium: Secondary | ICD-10-CM | POA: Diagnosis not present

## 2022-08-11 DIAGNOSIS — E1142 Type 2 diabetes mellitus with diabetic polyneuropathy: Secondary | ICD-10-CM | POA: Diagnosis not present

## 2022-08-14 ENCOUNTER — Other Ambulatory Visit: Payer: Self-pay | Admitting: Family Medicine

## 2022-09-01 DIAGNOSIS — L570 Actinic keratosis: Secondary | ICD-10-CM | POA: Diagnosis not present

## 2022-09-03 DIAGNOSIS — Z888 Allergy status to other drugs, medicaments and biological substances status: Secondary | ICD-10-CM | POA: Diagnosis not present

## 2022-09-03 DIAGNOSIS — H6123 Impacted cerumen, bilateral: Secondary | ICD-10-CM | POA: Diagnosis not present

## 2022-09-03 DIAGNOSIS — J343 Hypertrophy of nasal turbinates: Secondary | ICD-10-CM | POA: Diagnosis not present

## 2022-09-03 DIAGNOSIS — Z885 Allergy status to narcotic agent status: Secondary | ICD-10-CM | POA: Diagnosis not present

## 2022-09-03 DIAGNOSIS — J328 Other chronic sinusitis: Secondary | ICD-10-CM | POA: Diagnosis not present

## 2022-09-03 DIAGNOSIS — H903 Sensorineural hearing loss, bilateral: Secondary | ICD-10-CM | POA: Diagnosis not present

## 2022-09-03 DIAGNOSIS — Z886 Allergy status to analgesic agent status: Secondary | ICD-10-CM | POA: Diagnosis not present

## 2022-09-03 DIAGNOSIS — J342 Deviated nasal septum: Secondary | ICD-10-CM | POA: Diagnosis not present

## 2022-09-03 DIAGNOSIS — Z87891 Personal history of nicotine dependence: Secondary | ICD-10-CM | POA: Diagnosis not present

## 2022-10-12 ENCOUNTER — Ambulatory Visit (INDEPENDENT_AMBULATORY_CARE_PROVIDER_SITE_OTHER): Payer: Medicare Other | Admitting: Family Medicine

## 2022-10-12 ENCOUNTER — Encounter: Payer: Self-pay | Admitting: Family Medicine

## 2022-10-12 VITALS — BP 128/70 | HR 63 | Ht 71.0 in | Wt 184.0 lb

## 2022-10-12 DIAGNOSIS — E1159 Type 2 diabetes mellitus with other circulatory complications: Secondary | ICD-10-CM

## 2022-10-12 DIAGNOSIS — E785 Hyperlipidemia, unspecified: Secondary | ICD-10-CM | POA: Diagnosis not present

## 2022-10-12 DIAGNOSIS — E1169 Type 2 diabetes mellitus with other specified complication: Secondary | ICD-10-CM | POA: Diagnosis not present

## 2022-10-12 DIAGNOSIS — I152 Hypertension secondary to endocrine disorders: Secondary | ICD-10-CM | POA: Diagnosis not present

## 2022-10-12 LAB — BMP8+EGFR
BUN/Creatinine Ratio: 11 (ref 10–24)
BUN: 13 mg/dL (ref 10–36)
CO2: 24 mmol/L (ref 20–29)
Calcium: 9.2 mg/dL (ref 8.6–10.2)
Chloride: 94 mmol/L — ABNORMAL LOW (ref 96–106)
Creatinine, Ser: 1.18 mg/dL (ref 0.76–1.27)
Glucose: 160 mg/dL — ABNORMAL HIGH (ref 70–99)
Potassium: 4.1 mmol/L (ref 3.5–5.2)
Sodium: 133 mmol/L — ABNORMAL LOW (ref 134–144)
eGFR: 59 mL/min/{1.73_m2} — ABNORMAL LOW (ref >59)

## 2022-10-12 LAB — BAYER DCA HB A1C WAIVED: HB A1C (BAYER DCA - WAIVED): 6.2 % — ABNORMAL HIGH (ref 4.8–5.6)

## 2022-10-12 NOTE — Progress Notes (Signed)
BP 128/70   Pulse 63   Ht '5\' 11"'$  (1.803 m)   Wt 83.5 kg   SpO2 99%   BMI 25.66 kg/m    Subjective:   Patient ID: [name], male    DOB: 1929-10-10, 89 y.o.   MRN: [id number]  HPI: [name] is a 89 y.o. male presenting on 10/12/2022 for Medical Management of Chronic Issues, Hyperlipidemia, Diabetes, and Shortness of Breath (Pneumonia 11mago)  Type 2 diabetes mellitus Patient comes in today for recheck of their Type 2 Diabetes Mellitus. Patient's current medications include Metformin 500 mg PO qd. Patient is currently taking an ACE inhibitor/ARB. Patient's last ophthalmology exam was 1 year ago. Patient states he has an eye exam later this week. Patient's last foot exam was 3-4 months ago and patient is scheduled for a podiatry appointment next week. Patient denies any issues with their feet. The symptom started onset as an adult. Hypertension, hyperlipidemia, atrial fibrillation, and CAD are related to Type 2 Diabetes Mellitus.    Hyperlipidemia Patient is coming in for recheck of his hyperlipidemia. The patient is currently taking Atorvastatin and Omega-3 Fatty Acids. They deny any issues with myalgias or history of liver damage from it. They deny any focal numbness or weakness or chest pain.   Hypertension Patient is coming in for a recheck of their hypertension. Patient is currently taking Benazepril, Hydralazine, HCTZ, and Metoprolol. Patient's blood pressure today is 128/70. Patient denies lightheadedness, dizziness, weakness, headaches, blurred vision, chest pains. Patient denies any side effects from medication(s) and is content with current medication(s).   Shortness of Breath Patient states he is having some occasional shortness of breath that he uses albuterol and Advair for. Patient did have pneumonia 2 months ago, and states he is feeling much better now. Patient states his cardiologist said his shortness of breath could possibly decrease with a catheter placement,  but patient is hesitant due to risks of surgery including age.   Relevant past medical, surgical, family and social history were reviewed and updated as indicated. Interim medical history were reviewed. Allergies and medications were reviewed and updated.  Review of Systems  Constitutional:  Negative for chills and fever.  HENT:  Negative for congestion, rhinorrhea, tinnitus and trouble swallowing.   Respiratory:  Positive for shortness of breath. Negative for cough and chest tightness.   Cardiovascular:  Negative for chest pain and leg swelling.  Gastrointestinal:  Negative for abdominal pain, diarrhea, nausea and vomiting.  Musculoskeletal:  Negative for gait problem and myalgias.  Skin:  Negative for rash.  Neurological:  Negative for dizziness, weakness, light-headedness, numbness and headaches.  Psychiatric/Behavioral:  Negative for suicidal ideas.   All other systems reviewed and are negative.   Per HPI unless specifically indicated above.   Allergies as of 10/12/2022       Reactions   Alteplase Anaphylaxis   Codeine Other (See Comments)   Bad dreams   Lopid [gemfibrozil] Nausea Only   Voltaren [diclofenac Sodium] Diarrhea        Medication List        Accurate as of October 12, 2022 10:48 AM. If you have any questions, ask your nurse or doctor.          Accu-Chek FastClix Lancets Misc Check BS up to 4 TIMES DAILY Dx E11.9   Accu-Chek Guide test strip Generic drug: glucose blood USE 1 STRIP TO CHECK GLUCOSE UP TO 4 TIMES DAILY   acetaminophen 325 MG tablet Commonly known as:  TYLENOL Take 650 mg by mouth every 6 (six) hours as needed for mild pain, fever or headache.   Advair Diskus 100-50 MCG/ACT Aepb Generic drug: fluticasone-salmeterol INHALE 1 DOSE BY MOUTH EVERY 12 HOURS   albuterol 108 (90 Base) MCG/ACT inhaler Commonly known as: VENTOLIN HFA INHALE 1 TO 2 PUFFS BY MOUTH EVERY 6 HOURS AS NEEDED FOR WHEEZING FOR SHORTNESS OF BREATH   atorvastatin  80 MG tablet Commonly known as: LIPITOR Take 1 tablet (80 mg total) by mouth daily.   benazepril 20 MG tablet Commonly known as: LOTENSIN Take 1 tablet (20 mg total) by mouth daily.   blood glucose meter kit and supplies Kit Dispense based on patient and insurance preference. Use four times daily as directed.  Diagnosis type 2 diabetes   feeding supplement (GLUCERNA SHAKE) Liqd Take 237 mLs by mouth 2 (two) times daily between meals.   fish oil-omega-3 fatty acids 1000 MG capsule Take 2 g by mouth every evening.   hydrALAZINE 25 MG tablet Commonly known as: APRESOLINE Take 1 tablet (25 mg total) by mouth daily as needed.   hydrochlorothiazide 25 MG tablet Commonly known as: HYDRODIURIL Take 1 tablet (25 mg total) by mouth daily.   metFORMIN 500 MG 24 hr tablet Commonly known as: GLUCOPHAGE-XR Take 1 tablet (500 mg total) by mouth daily with breakfast.   metoprolol tartrate 100 MG tablet Commonly known as: LOPRESSOR Take 1 tablet (100 mg total) by mouth 2 (two) times daily.   nitroGLYCERIN 0.4 MG SL tablet Commonly known as: NITROSTAT USE AS DIRECTED   pantoprazole 40 MG tablet Commonly known as: PROTONIX Take 1 tablet (40 mg total) by mouth daily.   rivaroxaban 20 MG Tabs tablet Commonly known as: Xarelto Take 1 tablet (20 mg total) by mouth daily with supper.   Vitamin D 50 MCG (2000 UT) Caps Take 2,000 Units by mouth daily.         Objective:   BP 128/70   Pulse 63   Ht '5\' 11"'$  (1.803 m)   Wt 83.5 kg   SpO2 99%   BMI 25.66 kg/m   Wt Readings from Last 3 Encounters:  10/12/22 83.5 kg  07/10/22 84.4 kg  04/09/22 81.6 kg    Physical Exam Constitutional:      Appearance: He is well-developed.  Neck:     Thyroid: No thyromegaly.  Cardiovascular:     Rate and Rhythm: Normal rate. Rhythm irregular.  Pulmonary:     Effort: Pulmonary effort is normal.     Breath sounds: Examination of the right-lower field reveals rales. Examination of the  left-lower field reveals rales. Rales present. No decreased breath sounds, wheezing or rhonchi.  Chest:     Chest wall: No deformity, tenderness or edema.  Abdominal:     Tenderness: There is no right CVA tenderness or left CVA tenderness.  Musculoskeletal:     Cervical back: Neck supple.     Right lower leg: No tenderness. No edema.     Left lower leg: No tenderness. No edema.  Skin:    General: Skin is warm and dry.  Neurological:     Mental Status: He is alert and oriented to person, place, and time.  Psychiatric:        Mood and Affect: Mood normal.        Behavior: Behavior normal.     Assessment & Plan:   Problem List Items Addressed This Visit       Cardiovascular and Mediastinum  Hypertension associated with diabetes ([place])     Endocrine   Hyperlipidemia associated with type 2 diabetes mellitus ([place])   DM type 2 (diabetes mellitus, type 2) ([place]) - Primary   Relevant Orders   Bayer DCA Hb A1c Waived (Completed)   BMP8+EGFR (Completed)    A1c is 6.2 today. Continue all medications as prescribed. Continue staying active outside.   Still has small amount of congestion in lungs, will monitor closely in the future but if anything worsens he is to return sooner.  Follow up plan: Return in about 3 months (around 01/12/2023), or if symptoms worsen or fail to improve, for DM2 and hld and htn.  Return if experiencing increased shortness of breath that does not resolve or decrease with inhaler usage.  Counseling provided for all of the vaccine components Orders Placed This Encounter  Procedures   Bayer [facility] Hb A1c [facility]    Summer Worthington, PA-S2 [facility] 10/12/2022, 10:48 AM  I was personally present for all components of the history, physical exam and/or medical decision making.  I agree with the documentation performed by the PA student and agree with assessment and plan above.  PA student   Vonna Kotyk Kealie Barrie [facility] 10/12/2022, 10:48 AM

## 2022-10-19 NOTE — Progress Notes (Signed)
R/c

## 2022-10-20 DIAGNOSIS — L84 Corns and callosities: Secondary | ICD-10-CM | POA: Diagnosis not present

## 2022-10-20 DIAGNOSIS — E1142 Type 2 diabetes mellitus with diabetic polyneuropathy: Secondary | ICD-10-CM | POA: Diagnosis not present

## 2022-10-20 DIAGNOSIS — B351 Tinea unguium: Secondary | ICD-10-CM | POA: Diagnosis not present

## 2022-10-20 DIAGNOSIS — M79676 Pain in unspecified toe(s): Secondary | ICD-10-CM | POA: Diagnosis not present

## 2022-10-21 ENCOUNTER — Other Ambulatory Visit: Payer: Self-pay | Admitting: Family Medicine

## 2022-10-21 DIAGNOSIS — I152 Hypertension secondary to endocrine disorders: Secondary | ICD-10-CM

## 2022-10-21 DIAGNOSIS — E1159 Type 2 diabetes mellitus with other circulatory complications: Secondary | ICD-10-CM

## 2022-12-22 DIAGNOSIS — R600 Localized edema: Secondary | ICD-10-CM | POA: Diagnosis not present

## 2022-12-22 DIAGNOSIS — I959 Hypotension, unspecified: Secondary | ICD-10-CM | POA: Diagnosis not present

## 2022-12-22 DIAGNOSIS — R609 Edema, unspecified: Secondary | ICD-10-CM | POA: Diagnosis not present

## 2022-12-22 DIAGNOSIS — R404 Transient alteration of awareness: Secondary | ICD-10-CM | POA: Diagnosis not present

## 2022-12-22 DIAGNOSIS — Z9189 Other specified personal risk factors, not elsewhere classified: Secondary | ICD-10-CM | POA: Diagnosis not present

## 2022-12-22 DIAGNOSIS — I350 Nonrheumatic aortic (valve) stenosis: Secondary | ICD-10-CM | POA: Diagnosis not present

## 2022-12-22 DIAGNOSIS — R059 Cough, unspecified: Secondary | ICD-10-CM | POA: Diagnosis not present

## 2022-12-22 DIAGNOSIS — S0990XA Unspecified injury of head, initial encounter: Secondary | ICD-10-CM | POA: Diagnosis not present

## 2022-12-22 DIAGNOSIS — W19XXXA Unspecified fall, initial encounter: Secondary | ICD-10-CM | POA: Diagnosis not present

## 2022-12-23 ENCOUNTER — Other Ambulatory Visit: Payer: Self-pay | Admitting: *Deleted

## 2022-12-23 MED ORDER — ALBUTEROL SULFATE HFA 108 (90 BASE) MCG/ACT IN AERS: INHALATION_SPRAY | RESPIRATORY_TRACT | 0 refills | Status: DC

## 2022-12-29 DIAGNOSIS — M79676 Pain in unspecified toe(s): Secondary | ICD-10-CM | POA: Diagnosis not present

## 2022-12-29 DIAGNOSIS — E1142 Type 2 diabetes mellitus with diabetic polyneuropathy: Secondary | ICD-10-CM | POA: Diagnosis not present

## 2022-12-29 DIAGNOSIS — B351 Tinea unguium: Secondary | ICD-10-CM | POA: Diagnosis not present

## 2022-12-29 DIAGNOSIS — L84 Corns and callosities: Secondary | ICD-10-CM | POA: Diagnosis not present

## 2023-01-07 ENCOUNTER — Ambulatory Visit (INDEPENDENT_AMBULATORY_CARE_PROVIDER_SITE_OTHER): Payer: Medicare Other

## 2023-01-07 VITALS — Ht 71.0 in | Wt 196.0 lb

## 2023-01-07 DIAGNOSIS — Z0000 Encounter for general adult medical examination without abnormal findings: Secondary | ICD-10-CM

## 2023-01-07 NOTE — Progress Notes (Signed)
Subjective:   [name] is a 89 y.o. male who presents for Medicare Annual/Subsequent preventive examination. I connected with  [name] on 01/07/23 by a audio enabled telemedicine application and verified that I am speaking with the correct person using two identifiers.  Patient Location: Home  Provider Location: Home Office  I discussed the limitations of evaluation and management by telemedicine. The patient expressed understanding and agreed to proceed.  Review of Systems     Cardiac Risk Factors include: advanced age (>85men, >40 women);male gender;hypertension;dyslipidemia;diabetes mellitus     Objective:    Today's Vitals   01/07/23 0949  Weight: 196 lb (88.9 kg)  Height: 5\' 11"  (1.803 m)   Body mass index is 27.34 kg/m.     01/07/2023    9:54 AM 01/05/2022    8:26 AM 01/02/2021    8:33 AM 12/11/2019    9:32 AM 06/27/2019    8:02 PM 12/02/2018    9:04 AM 11/08/2017    1:44 PM  Advanced Directives  Does Patient Have a Medical Advance Directive? Yes No No No No No No  Type of Estate agent of [place];Living will        Copy of Healthcare Power of Attorney in Chart? No - copy requested        Would patient like information on creating a medical advance directive?  No - Patient declined No - Patient declined No - Patient declined  Yes (MAU/Ambulatory/Procedural Areas - Information given)     Current Medications (verified) Outpatient Encounter Medications as of 01/07/2023  Medication Sig   Accu-Chek FastClix Lancets MISC Check BS up to 4 TIMES DAILY Dx E11.9   acetaminophen (TYLENOL) 325 MG tablet Take 650 mg by mouth every 6 (six) hours as needed for mild pain, fever or headache.   ADVAIR DISKUS 100-50 MCG/ACT AEPB INHALE 1 DOSE BY MOUTH EVERY 12 HOURS   albuterol (VENTOLIN HFA) 108 (90 Base) MCG/ACT inhaler INHALE 1 TO 2 PUFFS BY MOUTH EVERY 6 HOURS AS NEEDED FOR WHEEZING FOR SHORTNESS OF BREATH   atorvastatin (LIPITOR) 80 MG tablet Take 1  tablet (80 mg total) by mouth daily.   benazepril (LOTENSIN) 20 MG tablet Take 1 tablet (20 mg total) by mouth daily.   blood glucose meter kit and supplies KIT Dispense based on patient and insurance preference. Use four times daily as directed.  Diagnosis type 2 diabetes   Cholecalciferol (VITAMIN D) 2000 units CAPS Take 2,000 Units by mouth daily.   feeding supplement, GLUCERNA SHAKE, (GLUCERNA SHAKE) LIQD Take 237 mLs by mouth 2 (two) times daily between meals.   fish oil-omega-3 fatty acids 1000 MG capsule Take 2 g by mouth every evening.   glucose blood (ACCU-CHEK GUIDE) test strip USE 1 STRIP TO CHECK GLUCOSE UP TO 4 TIMES DAILY   hydrALAZINE (APRESOLINE) 25 MG tablet Take 1 tablet (25 mg total) by mouth daily as needed.   hydrochlorothiazide (HYDRODIURIL) 25 MG tablet Take 1 tablet by mouth once daily   metFORMIN (GLUCOPHAGE-XR) 500 MG 24 hr tablet Take 1 tablet by mouth once daily with breakfast   metoprolol tartrate (LOPRESSOR) 100 MG tablet Take 1 tablet (100 mg total) by mouth 2 (two) times daily.   nitroGLYCERIN (NITROSTAT) 0.4 MG SL tablet USE AS DIRECTED   pantoprazole (PROTONIX) 40 MG tablet Take 1 tablet (40 mg total) by mouth daily.   rivaroxaban (XARELTO) 20 MG TABS tablet Take 1 tablet (20 mg total) by mouth daily with  supper.   No facility-administered encounter medications on file as of 01/07/2023.    Allergies (verified) Alteplase, Codeine, Lopid [gemfibrozil], and Voltaren [diclofenac sodium]   History: Past Medical History:  Diagnosis Date   Arthritis    "wrists, knees, back" (12/10/2015)   Asthma    BCC (basal cell carcinoma of skin) 01/28/2017   left inner cheek--sup & nod (CX3, 5FU)   BCC (basal cell carcinoma) 01/26/2017   left chin (molts)   BCC (basal cell carcinoma) 04/01/2017   midback--sup & nod (tx p bx)   BCC (basal cell carcinoma) 10/13/2017   above left outer eye brow--infiltrative (CX3, 5FU)   BCC (basal cell carcinoma) 06/02/2018   right post  forearm--nod (tx p bx)   BCC (basal cell carcinoma) 03/09/2019   right mid outer cheek--nod--(tx p bx)   Cerebral atherosclerosis    Chronic back pain    "lower primarily" (12/10/2015)   CORONARY ATHEROSCLEROSIS NATIVE CORONARY ARTERY    Myocardial infarction at [facility] in 1996 with a directional atherectomy of the 90% LAD stenosis and medical management of 60-70% proximal circumflex and 50% RCA stenosis.   DYSLIPIDEMIA    GERD (gastroesophageal reflux disease)    History of hiatal hernia    HYPERTENSION    ILIAC ARTERY ANEURYSM    Myocardial infarction (HCC) 1996   SCC (squamous cell carcinoma) 10/29/2016   left ear--in situ (tx p bx)   SCC (squamous cell carcinoma) 01/26/2017   left sideburn--well diff--(CX3, 5FU)   SCC (squamous cell carcinoma) 01/28/2017   left upper cheek--in situ (CX3, 5FU)   SCC (squamous cell carcinoma) 05/06/2017   left cheek--sup &well diff (tx p bx)   SCC (squamous cell carcinoma) 10/13/2017   right outer forearm--in situ (CX3 5FU)   SCC (squamous cell carcinoma) 10/13/2017   right ouer forarm--well diff (CX3 5FU)   SCC (squamous cell carcinoma) 10/13/2017   left sideburn, superior--well diff (CX3, 5FU)   SCC (squamous cell carcinoma) 06/02/2018   right forearm--in situ (tx p bx)   SCC (squamous cell carcinoma) 03/09/2019   left zy goma--well diff (tx p bx)   SCC (squamous cell carcinoma) 03/09/2019   right pre auricular--well diff (tx p bx)   SCCA (squamous cell carcinoma) of skin 05/22/2021   Left Buccal Cheek (in situ)   Stroke (HCC) 2003; 2013; 07/2015; 12/05/2015   denies residual on 12/10/2015, 12/13/16, 01/08/17   TIA (transient ischemic attack)    Type II diabetes mellitus (HCC)    UPPER RESPIRATORY INFECTION    Past Surgical History:  Procedure Laterality Date   ANAL FISSURE REPAIR  1990s   BASAL CELL CARCINOMA EXCISION Right 12/2015   "temple"   CATARACT EXTRACTION W/ INTRAOCULAR LENS  IMPLANT, BILATERAL Bilateral 2000s   CORONARY  ANGIOPLASTY  1996   KNEE CARTILAGE SURGERY Right 1970s   LOOP RECORDER INSERTION N/A 12/15/2016   Procedure: Loop Recorder Insertion;  Surgeon: Hillis Range, MD;  Location: [facility] INVASIVE CV LAB;  Service: Cardiovascular;  Laterality: N/A;   SHOULDER ARTHROSCOPY W/ ROTATOR CUFF REPAIR Left 2000s   TEE WITHOUT CARDIOVERSION N/A 12/15/2016   Procedure: TRANSESOPHAGEAL ECHOCARDIOGRAM (TEE);  Surgeon: Lewayne Bunting, MD;  Location: [facility] ENDOSCOPY;  Service: Cardiovascular;  Laterality: N/A;   Family History  Problem Relation Age of Onset   Coronary artery disease Mother    Cancer Father        lung   Stroke Father    Cancer Brother        lung  Stroke Sister    Social History   Socioeconomic History   Marital status: Married    Spouse name: Vera   Number of children: 3   Years of education: Not on file   Highest education level: 7th grade  Occupational History   Occupation: Retired    Comment: [employer]  Tobacco Use   Smoking status: Former    Packs/day: 0.12    Years: 45.00    Additional pack years: 0.00    Total pack years: 5.40    Types: Cigarettes    Quit date: 11/24/1984    Years since quitting: 38.1   Smokeless tobacco: Former    Types: Snuff   Tobacco comments:    "dipped snuff when I was a boy"  Vaping Use   Vaping Use: Never used  Substance and Sexual Activity   Alcohol use: No    Alcohol/week: 0.0 standard drinks of alcohol   Drug use: No   Sexual activity: Not Currently  Other Topics Concern   Not on file  Social History Narrative   One level living home with wife, Dwana Curd - she cooks every Sunday for the family of about 53 - children to great grandchildren   Social Determinants of Health   Financial Resource Strain: Low Risk  (01/07/2023)   Overall Financial Resource Strain (CARDIA)    Difficulty of Paying Living Expenses: Not hard at all  Food Insecurity: No Food Insecurity (01/07/2023)   Hunger Vital Sign    Worried About Running Out of Food in  the Last Year: Never true    Ran Out of Food in the Last Year: Never true  Transportation Needs: No Transportation Needs (01/07/2023)   PRAPARE - Administrator, Civil Service (Medical): No    Lack of Transportation (Non-Medical): No  Physical Activity: Insufficiently Active (01/07/2023)   Exercise Vital Sign    Days of Exercise per Week: 3 days    Minutes of Exercise per Session: 30 min  Stress: No Stress Concern Present (01/07/2023)   [employer] of Occupational Health - Occupational Stress Questionnaire    Feeling of Stress : Not at all  Social Connections: Moderately Integrated (01/07/2023)   Social Connection and Isolation Panel [NHANES]    Frequency of Communication with Friends and Family: More than three times a week    Frequency of Social Gatherings with Friends and Family: More than three times a week    Attends Religious Services: More than 4 times per year    Active Member of [employer] or Organizations: No    Attends Engineer, structural: Never    Marital Status: Married    Tobacco Counseling Counseling given: Not Answered Tobacco comments: "dipped snuff when I was a boy"   Clinical Intake:  Pre-visit preparation completed: Yes  Pain : No/denies pain     Nutritional Risks: None Diabetes: Yes CBG done?: No Did pt. bring in CBG monitor from home?: No  How often do you need to have someone help you when you read instructions, pamphlets, or other written materials from your doctor or pharmacy?: 1 - Never  Diabetic?yes  Nutrition Risk Assessment:  Has the patient had any N/V/D within the last 2 months?  No  Does the patient have any non-healing wounds?  No  Has the patient had any unintentional weight loss or weight gain?  No   Diabetes:  Is the patient diabetic?  Yes  If diabetic, was a CBG obtained today?  No  Did  the patient bring in their glucometer from home?  No  How often do you monitor your CBG's? Once A day .   Financial Strains  and Diabetes Management:  Are you having any financial strains with the device, your supplies or your medication? No .  Does the patient want to be seen by Chronic Care Management for management of their diabetes?  No  Would the patient like to be referred to a Nutritionist or for Diabetic Management?  No   Diabetic Exams:  Diabetic Eye Exam: Completed 01/2023 Diabetic Foot Exam: Overdue, Pt has been advised about the importance in completing this exam. Pt is scheduled for diabetic foot exam on next office visit .   Interpreter Needed?: No  Information entered by :: Renie Ora, LPN   Activities of Daily Living    01/07/2023    9:54 AM  In your present state of health, do you have any difficulty performing the following activities:  Hearing? 0  Vision? 0  Difficulty concentrating or making decisions? 0  Walking or climbing stairs? 0  Dressing or bathing? 0  Doing errands, shopping? 0  Preparing Food and eating ? Y  Using the Toilet? N  In the past six months, have you accidently leaked urine? Y  Do you have problems with loss of bowel control? N  Managing your Medications? Y  Managing your Finances? Y  Housekeeping or managing your Housekeeping? Y    Patient Care Team: Dettinger, Elige Radon, MD as PCP - General (Family Medicine) Rollene Rotunda, MD as PCP - Cardiology (Cardiology) Janalyn Harder, MD (Inactive) as Consulting Physician (Dermatology) Clinger, Beulah Gandy, MD (Otolaryngology) Rollene Rotunda, MD as Consulting Physician (Cardiology) Michaelle Copas, MD as Referring Physician (Optometry) Adam Phenix, DPM as Consulting Physician (Podiatry)  Indicate any recent Medical Services you may have received from other than [facility] providers in the past year (date may be approximate).     Assessment:   This is a routine wellness examination for [place].  Hearing/Vision screen Vision Screening - Comments:: Wears rx glasses - up to date with routine eye exams with  Dr.Lee    Dietary issues and exercise activities discussed: Current Exercise Habits: Home exercise routine, Type of exercise: walking, Time (Minutes): 30, Frequency (Times/Week): 3, Weekly Exercise (Minutes/Week): 90, Intensity: Mild, Exercise limited by: orthopedic condition(s)   Goals Addressed   None    Depression Screen    01/07/2023    9:53 AM 10/12/2022    9:46 AM 07/10/2022    8:21 AM 04/09/2022    8:51 AM 01/05/2022    8:22 AM 10/08/2021    9:15 AM 04/10/2021   12:59 PM  PHQ 2/9 Scores  PHQ - 2 Score 0 0 0 0 0 0 0  PHQ- 9 Score   1 1  1      Fall Risk    01/07/2023    9:51 AM 10/12/2022    9:45 AM 07/10/2022    8:21 AM 04/09/2022    8:51 AM 01/05/2022    8:20 AM  Fall Risk   Falls in the past year? 1 0 0 0 0  Number falls in past yr: 1    0  Injury with Fall? 1    0  Risk for fall due to : History of fall(s);Impaired balance/gait;Orthopedic patient    Impaired balance/gait;Orthopedic patient  Follow up Education provided;Falls prevention discussed    Education provided;Falls prevention discussed    FALL RISK PREVENTION PERTAINING TO THE HOME:  Any  stairs in or around the home? Yes  If so, are there any without handrails? No  Home free of loose throw rugs in walkways, pet beds, electrical cords, etc? Yes  Adequate lighting in your home to reduce risk of falls? Yes   ASSISTIVE DEVICES UTILIZED TO PREVENT FALLS:  Life alert? No  Use of a cane, walker or w/c? Yes  Grab bars in the bathroom? Yes  Shower chair or bench in shower? Yes  Elevated toilet seat or a handicapped toilet? Yes       11/08/2017    2:12 PM 04/22/2015    9:04 AM  MMSE - Mini Mental State Exam  Orientation to time 5 5  Orientation to Place 5 5  Registration 3 3  Attention/ Calculation 3 3  Recall 0 2  Language- name 2 objects 2 2  Language- repeat 1 1  Language- follow 3 step command 3 3  Language- read & follow direction 1 1  Write a sentence 1 1  Copy design 0 0  Total score 24 26        01/07/2023     9:55 AM 01/05/2022    8:25 AM 12/11/2019    9:37 AM 12/02/2018    9:08 AM  6CIT Screen  What Year? 0 points 0 points 0 points 0 points  What month? 0 points 0 points 0 points 3 points  What time? 0 points 0 points 0 points 0 points  Count back from 20 0 points 0 points 0 points 2 points  Months in reverse 0 points 4 points 0 points 0 points  Repeat phrase 0 points 2 points 4 points 0 points  Total Score 0 points 6 points 4 points 5 points    Immunizations Immunization History  Administered Date(s) Administered   Fluad Quad(high Dose 65+) 05/10/2019, 09/11/2020   Influenza Inj Mdck Quad Pf 05/10/2019   Influenza, High Dose Seasonal PF 06/10/2016, 07/20/2018   Influenza,inj,Quad PF,6+ Mos 08/15/2013, 05/02/2014, 05/23/2015, 05/18/2017   Moderna Sars-Covid-2 Vaccination 08/16/2019, 09/13/2019, 06/05/2020, 12/04/2020   Pneumococcal Conjugate-13 08/15/2013   Pneumococcal Polysaccharide-23 04/22/2015   Td 04/16/2011   Tdap 11/22/2010, 04/13/2011   Zoster, Live 09/13/2013    TDAP status: Due, Education has been provided regarding the importance of this vaccine. Advised may receive this vaccine at local pharmacy or Health Dept. Aware to provide a copy of the vaccination record if obtained from local pharmacy or Health Dept. Verbalized acceptance and understanding.  Flu Vaccine status: Declined, Education has been provided regarding the importance of this vaccine but patient still declined. Advised may receive this vaccine at local pharmacy or Health Dept. Aware to provide a copy of the vaccination record if obtained from local pharmacy or Health Dept. Verbalized acceptance and understanding.  Pneumococcal vaccine status: Up to date  Covid-19 vaccine status: Completed vaccines  Qualifies for Shingles Vaccine? Yes   Zostavax completed No   Shingrix Completed?: No.    Education has been provided regarding the importance of this vaccine. Patient has been advised to call insurance company to  determine out of pocket expense if they have not yet received this vaccine. Advised may also receive vaccine at local pharmacy or Health Dept. Verbalized acceptance and understanding.  Screening Tests Health Maintenance  Topic Date Due   DTaP/Tdap/Td (4 - Td or Tdap) 04/15/2021   COVID-19 Vaccine (5 - 2023-24 season) 04/03/2022   OPHTHALMOLOGY EXAM  01/12/2023 (Originally 06/17/2021)   Zoster Vaccines- Shingrix (1 of 2) 04/12/2023 (Originally 04/26/1949)  INFLUENZA VACCINE  03/04/2023   HEMOGLOBIN A1C  04/14/2023   FOOT EXAM  10/12/2023   Medicare Annual Wellness (AWV)  01/07/2024   Pneumonia Vaccine 64+ Years old  Completed   HPV VACCINES  Aged Out    Health Maintenance  Health Maintenance Due  Topic Date Due   DTaP/Tdap/Td (4 - Td or Tdap) 04/15/2021   COVID-19 Vaccine (5 - 2023-24 season) 04/03/2022    Colorectal cancer screening: No longer required.   Lung Cancer Screening: (Low Dose CT Chest recommended if Age 43-80 years, 30 pack-year currently smoking OR have quit w/in 15years.) does not qualify.   Lung Cancer Screening Referral: n/a  Additional Screening:  Hepatitis C Screening: does not qualify;   Vision Screening: Recommended annual ophthalmology exams for early detection of glaucoma and other disorders of the eye. Is the patient up to date with their annual eye exam?  Yes  Who is the provider or what is the name of the office in which the patient attends annual eye exams? Dr.Lee  If pt is not established with a provider, would they like to be referred to a provider to establish care? No .   Dental Screening: Recommended annual dental exams for proper oral hygiene  Community Resource Referral / Chronic Care Management: CRR required this visit?  No   CCM required this visit?  No      Plan:     I have personally reviewed and noted the following in the patient's chart:   Medical and social history Use of alcohol, tobacco or illicit drugs  Current  medications and supplements including opioid prescriptions. Patient is not currently taking opioid prescriptions. Functional ability and status Nutritional status Physical activity Advanced directives List of other physicians Hospitalizations, surgeries, and ER visits in previous 12 months Vitals Screenings to include cognitive, depression, and falls Referrals and appointments  In addition, I have reviewed and discussed with patient certain preventive protocols, quality metrics, and best practice recommendations. A written personalized care plan for preventive services as well as general preventive health recommendations were provided to patient.     Lorrene Reid, LPN   4/0/9811   Nurse Notes: None

## 2023-01-07 NOTE — Patient Instructions (Signed)
[name] , Thank you for taking time to come for your Medicare Wellness Visit. I appreciate your ongoing commitment to your health goals. Please review the following plan we discussed and let me know if I can assist you in the future.   These are the goals we discussed:  Goals      DIET - EAT MORE VEGETABLES     Increase vegetable intake to 2-3 servings per day.     Patient Stated     01/05/2022 AWV Goal: Fall Prevention  Over the next year, patient will decrease their risk for falls by: Using assistive devices, such as a cane or walker, as needed Identifying fall risks within their home and correcting them by: Removing throw rugs Adding handrails to stairs or ramps Removing clutter and keeping a clear pathway throughout the home Increasing light, especially at night Adding shower handles/bars Raising toilet seat Identifying potential personal risk factors for falls: Medication side effects Incontinence/urgency Vestibular dysfunction Hearing loss Musculoskeletal disorders Neurological disorders Orthostatic hypotension          This is a list of the screening recommended for you and due dates:  Health Maintenance  Topic Date Due   DTaP/Tdap/Td vaccine (4 - Td or Tdap) 04/15/2021   COVID-19 Vaccine (5 - 2023-24 season) 04/03/2022   Eye exam for diabetics  01/12/2023*   Zoster (Shingles) Vaccine (1 of 2) 04/12/2023*   Flu Shot  03/04/2023   Hemoglobin A1C  04/14/2023   Complete foot exam   10/12/2023   Medicare Annual Wellness Visit  01/07/2024   Pneumonia Vaccine  Completed   HPV Vaccine  Aged Out  *Topic was postponed. The date shown is not the original due date.    Advanced directives: Advance directive discussed with you today. I have provided a copy for you to complete at home and have notarized. Once this is complete please bring a copy in to our office so we can scan it into your chart.   Conditions/risks identified: Aim for 30 minutes of exercise or brisk  walking, 6-8 glasses of water, and 5 servings of fruits and vegetables each day.   Next appointment: Follow up in one year for your annual wellness visit.   Preventive Care 27 Years and Older, Male  Preventive care refers to lifestyle choices and visits with your health care provider that can promote health and wellness. What does preventive care include? A yearly physical exam. This is also called an annual well check. Dental exams once or twice a year. Routine eye exams. Ask your health care provider how often you should have your eyes checked. Personal lifestyle choices, including: Daily care of your teeth and gums. Regular physical activity. Eating a healthy diet. Avoiding tobacco and drug use. Limiting alcohol use. Practicing safe sex. Taking low doses of aspirin every day. Taking vitamin and mineral supplements as recommended by your health care provider. What happens during an annual well check? The services and screenings done by your health care provider during your annual well check will depend on your age, overall health, lifestyle risk factors, and family history of disease. Counseling  Your health care provider may ask you questions about your: Alcohol use. Tobacco use. Drug use. Emotional well-being. Home and relationship well-being. Sexual activity. Eating habits. History of falls. Memory and ability to understand (cognition). Work and work Astronomer. Screening  You may have the following tests or measurements: Height, weight, and BMI. Blood pressure. Lipid and cholesterol levels. These may be checked every  5 years, or more frequently if you are over 59 years old. Skin check. Lung cancer screening. You may have this screening every year starting at age 48 if you have a 30-pack-year history of smoking and currently smoke or have quit within the past 15 years. Fecal occult blood test (FOBT) of the stool. You may have this test every year starting at age  39. Flexible sigmoidoscopy or colonoscopy. You may have a sigmoidoscopy every 5 years or a colonoscopy every 10 years starting at age 20. Prostate cancer screening. Recommendations will vary depending on your family history and other risks. Hepatitis C blood test. Hepatitis B blood test. Sexually transmitted disease (STD) testing. Diabetes screening. This is done by checking your blood sugar (glucose) after you have not eaten for a while (fasting). You may have this done every 1-3 years. Abdominal aortic aneurysm (AAA) screening. You may need this if you are a current or former smoker. Osteoporosis. You may be screened starting at age 40 if you are at high risk. Talk with your health care provider about your test results, treatment options, and if necessary, the need for more tests. Vaccines  Your health care provider may recommend certain vaccines, such as: Influenza vaccine. This is recommended every year. Tetanus, diphtheria, and acellular pertussis (Tdap, Td) vaccine. You may need a Td booster every 10 years. Zoster vaccine. You may need this after age 83. Pneumococcal 13-valent conjugate (PCV13) vaccine. One dose is recommended after age 54. Pneumococcal polysaccharide (PPSV23) vaccine. One dose is recommended after age 68. Talk to your health care provider about which screenings and vaccines you need and how often you need them. This information is not intended to replace advice given to you by your health care provider. Make sure you discuss any questions you have with your health care provider. Document Released: 08/16/2015 Document Revised: 04/08/2016 Document Reviewed: 05/21/2015 Elsevier Interactive Patient Education  2017 [employer].  Fall Prevention in the Home Falls can cause injuries. They can happen to people of all ages. There are many things you can do to make your home safe and to help prevent falls. What can I do on the outside of my home? Regularly fix the edges of  walkways and driveways and fix any cracks. Remove anything that might make you trip as you walk through a door, such as a raised step or threshold. Trim any bushes or trees on the path to your home. Use bright outdoor lighting. Clear any walking paths of anything that might make someone trip, such as rocks or tools. Regularly check to see if handrails are loose or broken. Make sure that both sides of any steps have handrails. Any raised decks and porches should have guardrails on the edges. Have any leaves, snow, or ice cleared regularly. Use sand or salt on walking paths during winter. Clean up any spills in your garage right away. This includes oil or grease spills. What can I do in the bathroom? Use night lights. Install grab bars by the toilet and in the tub and shower. Do not use towel bars as grab bars. Use non-skid mats or decals in the tub or shower. If you need to sit down in the shower, use a plastic, non-slip stool. Keep the floor dry. Clean up any water that spills on the floor as soon as it happens. Remove soap buildup in the tub or shower regularly. Attach bath mats securely with double-sided non-slip rug tape. Do not have throw rugs and other things on  the floor that can make you trip. What can I do in the bedroom? Use night lights. Make sure that you have a light by your bed that is easy to reach. Do not use any sheets or blankets that are too big for your bed. They should not hang down onto the floor. Have a firm chair that has side arms. You can use this for support while you get dressed. Do not have throw rugs and other things on the floor that can make you trip. What can I do in the kitchen? Clean up any spills right away. Avoid walking on wet floors. Keep items that you use a lot in easy-to-reach places. If you need to reach something above you, use a strong step stool that has a grab bar. Keep electrical cords out of the way. Do not use floor polish or wax that  makes floors slippery. If you must use wax, use non-skid floor wax. Do not have throw rugs and other things on the floor that can make you trip. What can I do with my stairs? Do not leave any items on the stairs. Make sure that there are handrails on both sides of the stairs and use them. Fix handrails that are broken or loose. Make sure that handrails are as long as the stairways. Check any carpeting to make sure that it is firmly attached to the stairs. Fix any carpet that is loose or worn. Avoid having throw rugs at the top or bottom of the stairs. If you do have throw rugs, attach them to the floor with carpet tape. Make sure that you have a light switch at the top of the stairs and the bottom of the stairs. If you do not have them, ask someone to add them for you. What else can I do to help prevent falls? Wear shoes that: Do not have high heels. Have rubber bottoms. Are comfortable and fit you well. Are closed at the toe. Do not wear sandals. If you use a stepladder: Make sure that it is fully opened. Do not climb a closed stepladder. Make sure that both sides of the stepladder are locked into place. Ask someone to hold it for you, if possible. Clearly mark and make sure that you can see: Any grab bars or handrails. First and last steps. Where the edge of each step is. Use tools that help you move around (mobility aids) if they are needed. These include: Canes. Walkers. Scooters. Crutches. Turn on the lights when you go into a dark area. Replace any light bulbs as soon as they burn out. Set up your furniture so you have a clear path. Avoid moving your furniture around. If any of your floors are uneven, fix them. If there are any pets around you, be aware of where they are. Review your medicines with your doctor. Some medicines can make you feel dizzy. This can increase your chance of falling. Ask your doctor what other things that you can do to help prevent falls. This  information is not intended to replace advice given to you by your health care provider. Make sure you discuss any questions you have with your health care provider. Document Released: 05/16/2009 Document Revised: 12/26/2015 Document Reviewed: 08/24/2014 Elsevier Interactive Patient Education  2017 [employer].

## 2023-01-13 ENCOUNTER — Other Ambulatory Visit: Payer: Self-pay | Admitting: Family Medicine

## 2023-01-13 DIAGNOSIS — E1159 Type 2 diabetes mellitus with other circulatory complications: Secondary | ICD-10-CM

## 2023-01-14 ENCOUNTER — Encounter: Payer: Self-pay | Admitting: Family Medicine

## 2023-01-14 ENCOUNTER — Ambulatory Visit (INDEPENDENT_AMBULATORY_CARE_PROVIDER_SITE_OTHER): Payer: Medicare Other | Admitting: Family Medicine

## 2023-01-14 VITALS — BP 181/87 | HR 67 | Ht 71.0 in | Wt 183.0 lb

## 2023-01-14 DIAGNOSIS — I480 Paroxysmal atrial fibrillation: Secondary | ICD-10-CM

## 2023-01-14 DIAGNOSIS — E1159 Type 2 diabetes mellitus with other circulatory complications: Secondary | ICD-10-CM

## 2023-01-14 DIAGNOSIS — I152 Hypertension secondary to endocrine disorders: Secondary | ICD-10-CM

## 2023-01-14 DIAGNOSIS — C44629 Squamous cell carcinoma of skin of left upper limb, including shoulder: Secondary | ICD-10-CM | POA: Diagnosis not present

## 2023-01-14 DIAGNOSIS — I2510 Atherosclerotic heart disease of native coronary artery without angina pectoris: Secondary | ICD-10-CM | POA: Diagnosis not present

## 2023-01-14 DIAGNOSIS — E785 Hyperlipidemia, unspecified: Secondary | ICD-10-CM

## 2023-01-14 DIAGNOSIS — E1169 Type 2 diabetes mellitus with other specified complication: Secondary | ICD-10-CM

## 2023-01-14 DIAGNOSIS — Z7984 Long term (current) use of oral hypoglycemic drugs: Secondary | ICD-10-CM

## 2023-01-14 DIAGNOSIS — D485 Neoplasm of uncertain behavior of skin: Secondary | ICD-10-CM | POA: Diagnosis not present

## 2023-01-14 DIAGNOSIS — L570 Actinic keratosis: Secondary | ICD-10-CM | POA: Diagnosis not present

## 2023-01-14 LAB — CBC WITH DIFFERENTIAL/PLATELET
EOS (ABSOLUTE): 0.2 10*3/uL (ref 0.0–0.4)
Eos: 3 %
Hematocrit: 35.5 % — ABNORMAL LOW (ref 37.5–51.0)
Immature Granulocytes: 0 %
WBC: 5.8 10*3/uL (ref 3.4–10.8)

## 2023-01-14 LAB — LIPID PANEL

## 2023-01-14 LAB — BAYER DCA HB A1C WAIVED: HB A1C (BAYER DCA - WAIVED): 6 % — ABNORMAL HIGH (ref 4.8–5.6)

## 2023-01-14 MED ORDER — METOPROLOL TARTRATE 100 MG PO TABS: 100.0000 mg | ORAL_TABLET | Freq: Two times a day (BID) | ORAL | 3 refills | Status: DC

## 2023-01-14 MED ORDER — BENAZEPRIL HCL 20 MG PO TABS: 20.0000 mg | ORAL_TABLET | Freq: Every day | ORAL | 3 refills | Status: DC

## 2023-01-14 MED ORDER — PANTOPRAZOLE SODIUM 40 MG PO TBEC: 40.0000 mg | DELAYED_RELEASE_TABLET | Freq: Every day | ORAL | 3 refills | Status: DC

## 2023-01-14 MED ORDER — RIVAROXABAN 20 MG PO TABS
20.0000 mg | ORAL_TABLET | Freq: Every day | ORAL | 3 refills | Status: DC
Start: 2023-01-14 — End: 2023-05-28

## 2023-01-14 MED ORDER — METFORMIN HCL ER 500 MG PO TB24: 500.0000 mg | ORAL_TABLET | Freq: Every day | ORAL | 3 refills | Status: DC

## 2023-01-14 MED ORDER — HYDRALAZINE HCL 25 MG PO TABS: 25.0000 mg | ORAL_TABLET | Freq: Every day | ORAL | 3 refills | Status: DC | PRN

## 2023-01-14 MED ORDER — ATORVASTATIN CALCIUM 80 MG PO TABS: 80.0000 mg | ORAL_TABLET | Freq: Every day | ORAL | 3 refills | Status: DC

## 2023-01-14 NOTE — Progress Notes (Signed)
BP (!) 181/87   Pulse 67   Ht 5\' 11"  (1.803 m)   Wt 183 lb (83 kg)   SpO2 98%   BMI 25.52 kg/m    Subjective:   Patient ID: [name], male    DOB: [birth year], 89 y.o.   MRN: [id number]  HPI: [name] is a 89 y.o. male presenting on 01/14/2023 for Medical Management of Chronic Issues, Hyperlipidemia, and Diabetes   HPI Type 2 diabetes mellitus Patient comes in today for recheck of his diabetes. Patient has been currently taking metformin. Patient is currently on an ACE inhibitor/ARB. Patient has not seen an ophthalmologist this year. Patient denies any new issues with their feet. The symptom started onset as an adult hypertension and CAD and A-fib and hyperlipidemia ARE RELATED TO DM   Hypertension and CAD and A-fib Patient is currently on hydralazine and metoprolol, and their blood pressure today is 181/87. Patient denies any lightheadedness or dizziness. Patient denies headaches, blurred vision, chest pains, shortness of breath, or weakness. Denies any side effects from medication and is content with current medication.   Hyperlipidemia and CAD Patient is coming in for recheck of his hyperlipidemia. The patient is currently taking fish oils and atorvastatin. They deny any issues with myalgias or history of liver damage from it. They deny any focal numbness or weakness or chest pain.   Relevant past medical, surgical, family and social history reviewed and updated as indicated. Interim medical history since our last visit reviewed. Allergies and medications reviewed and updated.  Review of Systems  Constitutional:  Negative for chills and fever.  Eyes:  Negative for visual disturbance.  Respiratory:  Negative for shortness of breath and wheezing.   Cardiovascular:  Negative for chest pain and leg swelling.  Musculoskeletal:  Negative for back pain and gait problem.  Skin:  Negative for rash.  Neurological:  Negative for dizziness, weakness and light-headedness.  All  other systems reviewed and are negative.   Per HPI unless specifically indicated above   Allergies as of 01/14/2023       Reactions   Alteplase Anaphylaxis   Codeine Other (See Comments)   Bad dreams   Lopid [gemfibrozil] Nausea Only   Voltaren [diclofenac Sodium] Diarrhea        Medication List        Accurate as of January 14, 2023 10:44 AM. If you have any questions, ask your nurse or doctor.          Accu-Chek FastClix Lancets Misc Ortega BS up to 4 TIMES DAILY Dx E11.9   Accu-Chek Guide test strip Generic drug: glucose blood USE 1 STRIP TO Ortega GLUCOSE UP TO 4 TIMES DAILY   acetaminophen 325 MG tablet Commonly known as: TYLENOL Take 650 mg by mouth every 6 (six) hours as needed for mild pain, fever or headache.   Advair Diskus 100-50 MCG/ACT Aepb Generic drug: fluticasone-salmeterol INHALE 1 DOSE BY MOUTH EVERY 12 HOURS   albuterol 108 (90 Base) MCG/ACT inhaler Commonly known as: VENTOLIN HFA INHALE 1 TO 2 PUFFS BY MOUTH EVERY 6 HOURS AS NEEDED FOR WHEEZING FOR SHORTNESS OF BREATH   atorvastatin 80 MG tablet Commonly known as: LIPITOR Take 1 tablet (80 mg total) by mouth daily.   benazepril 20 MG tablet Commonly known as: LOTENSIN Take 1 tablet (20 mg total) by mouth daily.   blood glucose meter kit and supplies Kit Dispense based on patient and insurance preference. Use four times daily as directed.  Diagnosis type 2 diabetes   feeding supplement (GLUCERNA SHAKE) Liqd Take 237 mLs by mouth 2 (two) times daily between meals.   fish oil-omega-3 fatty acids 1000 MG capsule Take 2 g by mouth every evening.   hydrALAZINE 25 MG tablet Commonly known as: APRESOLINE Take 1 tablet (25 mg total) by mouth daily as needed.   hydrochlorothiazide 25 MG tablet Commonly known as: HYDRODIURIL Take 1 tablet by mouth once daily   metFORMIN 500 MG 24 hr tablet Commonly known as: GLUCOPHAGE-XR Take 1 tablet (500 mg total) by mouth daily with breakfast.    metoprolol tartrate 100 MG tablet Commonly known as: LOPRESSOR Take 1 tablet (100 mg total) by mouth 2 (two) times daily.   nitroGLYCERIN 0.4 MG SL tablet Commonly known as: NITROSTAT USE AS DIRECTED   pantoprazole 40 MG tablet Commonly known as: PROTONIX Take 1 tablet (40 mg total) by mouth daily.   rivaroxaban 20 MG Tabs tablet Commonly known as: Xarelto Take 1 tablet (20 mg total) by mouth daily with supper.   Vitamin D 50 MCG (2000 UT) Caps Take 2,000 Units by mouth daily.         Objective:   BP (!) 181/87   Pulse 67   Ht 5\' 11"  (1.803 m)   Wt 183 lb (83 kg)   SpO2 98%   BMI 25.52 kg/m   Wt Readings from Last 3 Encounters:  01/14/23 183 lb (83 kg)  01/07/23 196 lb (88.9 kg)  10/12/22 184 lb (83.5 kg)    Physical Exam Vitals and nursing note reviewed.  Constitutional:      General: He is not in acute distress.    Appearance: He is well-developed. He is not diaphoretic.  Eyes:     General: No scleral icterus.    Conjunctiva/sclera: Conjunctivae normal.  Neck:     Thyroid: No thyromegaly.  Cardiovascular:     Rate and Rhythm: Normal rate and regular rhythm.     Heart sounds: Normal heart sounds. No murmur heard. Pulmonary:     Effort: Pulmonary effort is normal. No respiratory distress.     Breath sounds: Normal breath sounds. No wheezing.  Musculoskeletal:        General: No swelling. Normal range of motion.     Cervical back: Neck supple.  Lymphadenopathy:     Cervical: No cervical adenopathy.  Skin:    General: Skin is warm and dry.     Findings: No rash.  Neurological:     Mental Status: He is alert and oriented to person, place, and time.     Coordination: Coordination normal.  Psychiatric:        Behavior: Behavior normal.     A1c 6.0  Assessment & Plan:   Problem List Items Addressed This Visit       Cardiovascular and Mediastinum   Hypertension associated with diabetes (HCC)   Relevant Medications   atorvastatin (LIPITOR) 80  MG tablet   benazepril (LOTENSIN) 20 MG tablet   hydrALAZINE (APRESOLINE) 25 MG tablet   metFORMIN (GLUCOPHAGE-XR) 500 MG 24 hr tablet   metoprolol tartrate (LOPRESSOR) 100 MG tablet   rivaroxaban (XARELTO) 20 MG TABS tablet   Other Relevant Orders   CBC with Differential/Platelet   Lipid panel   Bayer DCA Hb A1c Waived   Paroxysmal atrial fibrillation (HCC)   Relevant Medications   atorvastatin (LIPITOR) 80 MG tablet   benazepril (LOTENSIN) 20 MG tablet   hydrALAZINE (APRESOLINE) 25 MG tablet   metoprolol tartrate (  LOPRESSOR) 100 MG tablet   rivaroxaban (XARELTO) 20 MG TABS tablet   CORONARY ATHEROSCLEROSIS NATIVE CORONARY ARTERY   Relevant Medications   atorvastatin (LIPITOR) 80 MG tablet   benazepril (LOTENSIN) 20 MG tablet   hydrALAZINE (APRESOLINE) 25 MG tablet   metoprolol tartrate (LOPRESSOR) 100 MG tablet   rivaroxaban (XARELTO) 20 MG TABS tablet     Endocrine   Hyperlipidemia associated with type 2 diabetes mellitus (HCC)   Relevant Medications   atorvastatin (LIPITOR) 80 MG tablet   benazepril (LOTENSIN) 20 MG tablet   hydrALAZINE (APRESOLINE) 25 MG tablet   metFORMIN (GLUCOPHAGE-XR) 500 MG 24 hr tablet   metoprolol tartrate (LOPRESSOR) 100 MG tablet   rivaroxaban (XARELTO) 20 MG TABS tablet   Other Relevant Orders   CBC with Differential/Platelet   Lipid panel   Bayer DCA Hb A1c Waived   DM type 2 (diabetes mellitus, type 2) (HCC) - Primary   Relevant Medications   atorvastatin (LIPITOR) 80 MG tablet   benazepril (LOTENSIN) 20 MG tablet   metFORMIN (GLUCOPHAGE-XR) 500 MG 24 hr tablet   Other Relevant Orders   CBC with Differential/Platelet   Lipid panel   Bayer DCA Hb A1c Waived    Blood pressure on recheck 150/73, seems to be doing better at home, he says he typically runs in the 120s over 60s and 70s.  No change in medication, continue to monitor blood pressure   Follow up plan: Return in about 3 months (around 04/16/2023), or if symptoms worsen or  fail to improve, for Diabetes and hypertension and cholesterol.  Counseling provided for all of the vaccine components Orders Placed This Encounter  Procedures   CBC with Differential/Platelet   Lipid panel   Bayer DCA Hb A1c Waived    Arville Care, MD [place] [facility] 01/14/2023, 10:44 AM

## 2023-01-15 LAB — CBC WITH DIFFERENTIAL/PLATELET
Basophils Absolute: 0 10*3/uL (ref 0.0–0.2)
Basos: 1 %
Hemoglobin: 12 g/dL — ABNORMAL LOW (ref 13.0–17.7)
Immature Grans (Abs): 0 10*3/uL (ref 0.0–0.1)
Lymphocytes Absolute: 1.1 10*3/uL (ref 0.7–3.1)
Lymphs: 19 %
MCH: 33.6 pg — ABNORMAL HIGH (ref 26.6–33.0)
MCHC: 33.8 g/dL (ref 31.5–35.7)
MCV: 99 fL — ABNORMAL HIGH (ref 79–97)
Monocytes Absolute: 0.7 10*3/uL (ref 0.1–0.9)
Monocytes: 12 %
Neutrophils Absolute: 3.9 10*3/uL (ref 1.4–7.0)
Neutrophils: 65 %
Platelets: 179 10*3/uL (ref 150–450)
RBC: 3.57 x10E6/uL — ABNORMAL LOW (ref 4.14–5.80)
RDW: 12.2 % (ref 11.6–15.4)

## 2023-01-15 LAB — LIPID PANEL
Chol/HDL Ratio: 2 ratio (ref 0.0–5.0)
Cholesterol, Total: 89 mg/dL — ABNORMAL LOW (ref 100–199)
LDL Chol Calc (NIH): 28 mg/dL (ref 0–99)
VLDL Cholesterol Cal: 17 mg/dL (ref 5–40)

## 2023-02-15 DIAGNOSIS — C44629 Squamous cell carcinoma of skin of left upper limb, including shoulder: Secondary | ICD-10-CM | POA: Diagnosis not present

## 2023-02-16 ENCOUNTER — Other Ambulatory Visit: Payer: Self-pay | Admitting: Family Medicine

## 2023-02-16 DIAGNOSIS — E1159 Type 2 diabetes mellitus with other circulatory complications: Secondary | ICD-10-CM

## 2023-03-01 DIAGNOSIS — D0462 Carcinoma in situ of skin of left upper limb, including shoulder: Secondary | ICD-10-CM | POA: Diagnosis not present

## 2023-03-04 DIAGNOSIS — H6123 Impacted cerumen, bilateral: Secondary | ICD-10-CM | POA: Diagnosis not present

## 2023-03-04 DIAGNOSIS — J343 Hypertrophy of nasal turbinates: Secondary | ICD-10-CM | POA: Diagnosis not present

## 2023-03-04 DIAGNOSIS — H903 Sensorineural hearing loss, bilateral: Secondary | ICD-10-CM | POA: Diagnosis not present

## 2023-03-04 DIAGNOSIS — J328 Other chronic sinusitis: Secondary | ICD-10-CM | POA: Diagnosis not present

## 2023-03-04 DIAGNOSIS — J342 Deviated nasal septum: Secondary | ICD-10-CM | POA: Diagnosis not present

## 2023-03-09 DIAGNOSIS — E1142 Type 2 diabetes mellitus with diabetic polyneuropathy: Secondary | ICD-10-CM | POA: Diagnosis not present

## 2023-03-09 DIAGNOSIS — L84 Corns and callosities: Secondary | ICD-10-CM | POA: Diagnosis not present

## 2023-03-09 DIAGNOSIS — M79676 Pain in unspecified toe(s): Secondary | ICD-10-CM | POA: Diagnosis not present

## 2023-03-09 DIAGNOSIS — B351 Tinea unguium: Secondary | ICD-10-CM | POA: Diagnosis not present

## 2023-04-14 ENCOUNTER — Other Ambulatory Visit: Payer: Self-pay | Admitting: Family Medicine

## 2023-04-21 ENCOUNTER — Ambulatory Visit (INDEPENDENT_AMBULATORY_CARE_PROVIDER_SITE_OTHER): Payer: Medicare Other | Admitting: Family Medicine

## 2023-04-21 ENCOUNTER — Encounter: Payer: Self-pay | Admitting: Family Medicine

## 2023-04-21 VITALS — BP 175/92 | HR 64 | Temp 97.8°F | Ht 71.0 in | Wt 188.2 lb

## 2023-04-21 DIAGNOSIS — E785 Hyperlipidemia, unspecified: Secondary | ICD-10-CM | POA: Diagnosis not present

## 2023-04-21 DIAGNOSIS — E1159 Type 2 diabetes mellitus with other circulatory complications: Secondary | ICD-10-CM

## 2023-04-21 DIAGNOSIS — Z23 Encounter for immunization: Secondary | ICD-10-CM

## 2023-04-21 DIAGNOSIS — K219 Gastro-esophageal reflux disease without esophagitis: Secondary | ICD-10-CM | POA: Diagnosis not present

## 2023-04-21 DIAGNOSIS — E1169 Type 2 diabetes mellitus with other specified complication: Secondary | ICD-10-CM

## 2023-04-21 DIAGNOSIS — Z7984 Long term (current) use of oral hypoglycemic drugs: Secondary | ICD-10-CM

## 2023-04-21 DIAGNOSIS — M7022 Olecranon bursitis, left elbow: Secondary | ICD-10-CM

## 2023-04-21 DIAGNOSIS — I152 Hypertension secondary to endocrine disorders: Secondary | ICD-10-CM

## 2023-04-21 LAB — CMP14+EGFR
ALT: 16 IU/L (ref 0–44)
AST: 20 IU/L (ref 0–40)
Albumin: 4.3 g/dL (ref 3.6–4.6)
Alkaline Phosphatase: 104 IU/L (ref 44–121)
BUN/Creatinine Ratio: 10 (ref 10–24)
BUN: 13 mg/dL (ref 10–36)
Bilirubin Total: 0.9 mg/dL (ref 0.0–1.2)
CO2: 24 mmol/L (ref 20–29)
Calcium: 9.4 mg/dL (ref 8.6–10.2)
Chloride: 94 mmol/L — ABNORMAL LOW (ref 96–106)
Creatinine, Ser: 1.29 mg/dL — ABNORMAL HIGH (ref 0.76–1.27)
Globulin, Total: 2.3 g/dL (ref 1.5–4.5)
Glucose: 149 mg/dL — ABNORMAL HIGH (ref 70–99)
Potassium: 4.4 mmol/L (ref 3.5–5.2)
Sodium: 134 mmol/L (ref 134–144)
Total Protein: 6.6 g/dL (ref 6.0–8.5)
eGFR: 53 mL/min/{1.73_m2} — ABNORMAL LOW (ref >59)

## 2023-04-21 LAB — CBC WITH DIFFERENTIAL/PLATELET
Basophils Absolute: 0 10*3/uL (ref 0.0–0.2)
Basos: 0 %
EOS (ABSOLUTE): 0.3 10*3/uL (ref 0.0–0.4)
Eos: 4 %
Hematocrit: 38.5 % (ref 37.5–51.0)
Hemoglobin: 13.1 g/dL (ref 13.0–17.7)
Immature Grans (Abs): 0 10*3/uL (ref 0.0–0.1)
Immature Granulocytes: 0 %
Lymphocytes Absolute: 1.8 10*3/uL (ref 0.7–3.1)
Lymphs: 22 %
MCH: 34.3 pg — ABNORMAL HIGH (ref 26.6–33.0)
MCHC: 34 g/dL (ref 31.5–35.7)
MCV: 101 fL — ABNORMAL HIGH (ref 79–97)
Monocytes Absolute: 0.9 10*3/uL (ref 0.1–0.9)
Monocytes: 11 %
Neutrophils Absolute: 5 10*3/uL (ref 1.4–7.0)
Neutrophils: 63 %
Platelets: 194 10*3/uL (ref 150–450)
RBC: 3.82 x10E6/uL — ABNORMAL LOW (ref 4.14–5.80)
RDW: 12 % (ref 11.6–15.4)
WBC: 8 10*3/uL (ref 3.4–10.8)

## 2023-04-21 LAB — LIPID PANEL
Chol/HDL Ratio: 2.1 ratio (ref 0.0–5.0)
Cholesterol, Total: 99 mg/dL — ABNORMAL LOW (ref 100–199)
HDL: 48 mg/dL (ref >39)
LDL Chol Calc (NIH): 33 mg/dL (ref 0–99)
Triglycerides: 91 mg/dL (ref 0–149)
VLDL Cholesterol Cal: 18 mg/dL (ref 5–40)

## 2023-04-21 LAB — BAYER DCA HB A1C WAIVED: HB A1C (BAYER DCA - WAIVED): 6.4 % — ABNORMAL HIGH (ref 4.8–5.6)

## 2023-04-21 NOTE — Progress Notes (Addendum)
BP (!) 175/92   Pulse 64   Temp 97.8 F (36.6 C) (Temporal)   Ht 5\' 11"  (1.803 m)   Wt 188 lb 3.2 oz (85.4 kg)   SpO2 99%   BMI 26.25 kg/m    Subjective:   Patient ID: [name], male    DOB: 1929-11-04, 89 y.o.   MRN: [id number]  HPI: [name] is a 89 y.o. male presenting on 04/21/2023 for Medical Management of Chronic Issues (3 month) and Elbow Pain (Has fluid on left elbow for couple months. Only sore if he touches it)   HPI Type 2 diabetes mellitus Patient comes in today for recheck of his diabetes. Patient has been currently taking metformin. Patient is currently on an ACE inhibitor/ARB. Patient has seen an ophthalmologist this year. Patient denies any new issues with their feet. The symptom started onset as an adult hypertension and hyperlipidemia ARE RELATED TO DM   Hypertension Patient is currently on benazepril and hydrochlorothiazide and metoprolol and hydralazine as needed, and their blood pressure today is 175/92 and 150/77. Patient denies any lightheadedness or dizziness. Patient denies headaches, blurred vision, chest pains, shortness of breath, or weakness. Denies any side effects from medication and is content with current medication.   Hyperlipidemia Patient is coming in for recheck of his hyperlipidemia. The patient is currently taking fish oils and atorvastatin. They deny any issues with myalgias or history of liver damage from it. They deny any focal numbness or weakness or chest pain.   Patient has swelling and inflammation on his left elbow This swelling and inflammation of his left elbow has been there for a few weeks.  It does not hurt unless he puts pressure on it.  He denies any redness or warmth or skin changes but is just bulges out on that left elbow.  Son does tell that he falls asleep resting on his arm chair on that arm very frequently.  Patient's son states that over the past few days he has been having little more trouble with his breathing  and some wheezing.  Patient says that typically September is one of the harder months for him because of his breathing issues.  He does take his Advair and also uses albuterol 3-4 times a day.  He was a former smoker that quit many years ago  Relevant past medical, surgical, family and social history reviewed and updated as indicated. Interim medical history since our last visit reviewed. Allergies and medications reviewed and updated.  Review of Systems  Constitutional:  Negative for chills and fever.  Eyes:  Negative for visual disturbance.  Respiratory:  Positive for cough, shortness of breath (Exertional) and wheezing.   Cardiovascular:  Negative for chest pain and leg swelling.  Musculoskeletal:  Positive for joint swelling.  Skin:  Negative for rash.  Neurological:  Negative for dizziness and light-headedness.  All other systems reviewed and are negative.   Per HPI unless specifically indicated above   Allergies as of 04/21/2023       Reactions   Alteplase Anaphylaxis   Codeine Other (See Comments)   Bad dreams   Lopid [gemfibrozil] Nausea Only   Voltaren [diclofenac Sodium] Diarrhea        Medication List        Accurate as of April 21, 2023 11:24 AM. If you have any questions, ask your nurse or doctor.          Accu-Chek FastClix Lancets Misc Check BS up to 4 TIMES  DAILY Dx E11.9   Accu-Chek Guide test strip Generic drug: glucose blood USE 1 STRIP TO CHECK GLUCOSE UP TO 4 TIMES DAILY   acetaminophen 325 MG tablet Commonly known as: TYLENOL Take 650 mg by mouth every 6 (six) hours as needed for mild pain, fever or headache.   Advair Diskus 100-50 MCG/ACT Aepb Generic drug: fluticasone-salmeterol INHALE 1 PUFF INTO LUNGS EVERY 12 HOURS   albuterol 108 (90 Base) MCG/ACT inhaler Commonly known as: VENTOLIN HFA INHALE 1 TO 2 PUFFS BY MOUTH EVERY 6 HOURS AS NEEDED FOR WHEEZING OR SHORTNESS OF BREATH   atorvastatin 80 MG tablet Commonly known as:  LIPITOR Take 1 tablet (80 mg total) by mouth daily.   benazepril 20 MG tablet Commonly known as: LOTENSIN Take 1 tablet (20 mg total) by mouth daily.   blood glucose meter kit and supplies Kit Dispense based on patient and insurance preference. Use four times daily as directed.  Diagnosis type 2 diabetes   feeding supplement (GLUCERNA SHAKE) Liqd Take 237 mLs by mouth 2 (two) times daily between meals.   fish oil-omega-3 fatty acids 1000 MG capsule Take 2 g by mouth every evening.   hydrALAZINE 25 MG tablet Commonly known as: APRESOLINE Take 1 tablet (25 mg total) by mouth daily as needed.   hydrochlorothiazide 25 MG tablet Commonly known as: HYDRODIURIL Take 1 tablet by mouth once daily   metFORMIN 500 MG 24 hr tablet Commonly known as: GLUCOPHAGE-XR Take 1 tablet (500 mg total) by mouth daily with breakfast.   metoprolol tartrate 100 MG tablet Commonly known as: LOPRESSOR Take 1 tablet (100 mg total) by mouth 2 (two) times daily.   nitroGLYCERIN 0.4 MG SL tablet Commonly known as: NITROSTAT USE AS DIRECTED   pantoprazole 40 MG tablet Commonly known as: PROTONIX Take 1 tablet (40 mg total) by mouth daily.   rivaroxaban 20 MG Tabs tablet Commonly known as: Xarelto Take 1 tablet (20 mg total) by mouth daily with supper.   Vitamin D 50 MCG (2000 UT) Caps Take 2,000 Units by mouth daily.         Objective:   BP (!) 175/92   Pulse 64   Temp 97.8 F (36.6 C) (Temporal)   Ht 5\' 11"  (1.803 m)   Wt 188 lb 3.2 oz (85.4 kg)   SpO2 99%   BMI 26.25 kg/m   Wt Readings from Last 3 Encounters:  04/21/23 188 lb 3.2 oz (85.4 kg)  01/14/23 183 lb (83 kg)  01/07/23 196 lb (88.9 kg)    Physical Exam Vitals and nursing note reviewed.  Constitutional:      General: He is not in acute distress.    Appearance: He is well-developed. He is not diaphoretic.  Eyes:     General: No scleral icterus.    Conjunctiva/sclera: Conjunctivae normal.  Neck:     Thyroid: No  thyromegaly.  Cardiovascular:     Rate and Rhythm: Normal rate and regular rhythm.     Heart sounds: Normal heart sounds. No murmur heard. Pulmonary:     Effort: Pulmonary effort is normal. No respiratory distress.     Breath sounds: Normal breath sounds. No wheezing, rhonchi or rales.  Musculoskeletal:        General: No swelling. Normal range of motion.     Left elbow: Swelling (Olecranon bursitis of left elbow) present. No deformity or lacerations. Normal range of motion. No tenderness.     Cervical back: Neck supple.  Lymphadenopathy:  Cervical: No cervical adenopathy.  Skin:    General: Skin is warm and dry.     Findings: No rash.  Neurological:     Mental Status: He is alert and oriented to person, place, and time.     Coordination: Coordination normal.  Psychiatric:        Behavior: Behavior normal.       Assessment & Plan:   Problem List Items Addressed This Visit       Cardiovascular and Mediastinum   Hypertension associated with diabetes (HCC) - Primary   Relevant Orders   CBC with Differential/Platelet   CMP14+EGFR     Digestive   GERD (gastroesophageal reflux disease)     Endocrine   Hyperlipidemia associated with type 2 diabetes mellitus (HCC)   Relevant Orders   Lipid panel   DM type 2 (diabetes mellitus, type 2) (HCC)   Relevant Orders   Bayer DCA Hb A1c Waived   Other Visit Diagnoses     Encounter for immunization       Relevant Orders   Flu Vaccine Trivalent High Dose (Fluad) (Completed)   Olecranon bursitis of left elbow           Will give sample for Breztri and see if he does a little bit better with the Breztri during this time and then the low back onto the Advair and the rest of the year When A1c was 6.4 which looks good.  Blood pressure was elevated today and they are going to monitor the blood pressure closely over the next 2 weeks and bring [place] the numbers Follow up plan: Return in about 3 months (around 07/21/2023), or if  symptoms worsen or fail to improve, for Diabetes recheck.  Counseling provided for all of the vaccine components Orders Placed This Encounter  Procedures   Flu Vaccine Trivalent High Dose (Fluad)   CBC with Differential/Platelet   CMP14+EGFR   Lipid panel   Bayer DCA Hb A1c Waived    Arville Care, MD [facility] 04/21/2023, 11:24 AM

## 2023-05-14 DIAGNOSIS — D485 Neoplasm of uncertain behavior of skin: Secondary | ICD-10-CM | POA: Diagnosis not present

## 2023-05-14 DIAGNOSIS — L814 Other melanin hyperpigmentation: Secondary | ICD-10-CM | POA: Diagnosis not present

## 2023-05-14 DIAGNOSIS — C44311 Basal cell carcinoma of skin of nose: Secondary | ICD-10-CM | POA: Diagnosis not present

## 2023-05-14 DIAGNOSIS — C44329 Squamous cell carcinoma of skin of other parts of face: Secondary | ICD-10-CM | POA: Diagnosis not present

## 2023-05-18 DIAGNOSIS — L84 Corns and callosities: Secondary | ICD-10-CM | POA: Diagnosis not present

## 2023-05-18 DIAGNOSIS — E1142 Type 2 diabetes mellitus with diabetic polyneuropathy: Secondary | ICD-10-CM | POA: Diagnosis not present

## 2023-05-18 DIAGNOSIS — B351 Tinea unguium: Secondary | ICD-10-CM | POA: Diagnosis not present

## 2023-05-18 DIAGNOSIS — M79676 Pain in unspecified toe(s): Secondary | ICD-10-CM | POA: Diagnosis not present

## 2023-05-24 ENCOUNTER — Other Ambulatory Visit: Payer: Self-pay

## 2023-05-24 ENCOUNTER — Inpatient Hospital Stay (HOSPITAL_COMMUNITY)
Admission: EM | Admit: 2023-05-24 | Discharge: 2023-05-28 | DRG: 280 | Disposition: A | Discharge status: Home Health | Payer: Medicare Other | Attending: Family Medicine | Admitting: Family Medicine

## 2023-05-24 ENCOUNTER — Emergency Department (HOSPITAL_COMMUNITY): Payer: Medicare Other

## 2023-05-24 DIAGNOSIS — I6782 Cerebral ischemia: Secondary | ICD-10-CM | POA: Diagnosis not present

## 2023-05-24 DIAGNOSIS — E1169 Type 2 diabetes mellitus with other specified complication: Secondary | ICD-10-CM | POA: Diagnosis not present

## 2023-05-24 DIAGNOSIS — I10 Essential (primary) hypertension: Secondary | ICD-10-CM | POA: Diagnosis not present

## 2023-05-24 DIAGNOSIS — R0789 Other chest pain: Secondary | ICD-10-CM

## 2023-05-24 DIAGNOSIS — Z961 Presence of intraocular lens: Secondary | ICD-10-CM | POA: Diagnosis present

## 2023-05-24 DIAGNOSIS — I350 Nonrheumatic aortic (valve) stenosis: Secondary | ICD-10-CM | POA: Diagnosis not present

## 2023-05-24 DIAGNOSIS — Z7901 Long term (current) use of anticoagulants: Secondary | ICD-10-CM

## 2023-05-24 DIAGNOSIS — R4701 Aphasia: Secondary | ICD-10-CM | POA: Diagnosis present

## 2023-05-24 DIAGNOSIS — Z823 Family history of stroke: Secondary | ICD-10-CM

## 2023-05-24 DIAGNOSIS — Z789 Other specified health status: Secondary | ICD-10-CM | POA: Diagnosis not present

## 2023-05-24 DIAGNOSIS — R001 Bradycardia, unspecified: Secondary | ICD-10-CM | POA: Diagnosis present

## 2023-05-24 DIAGNOSIS — I480 Paroxysmal atrial fibrillation: Secondary | ICD-10-CM | POA: Diagnosis not present

## 2023-05-24 DIAGNOSIS — Z85828 Personal history of other malignant neoplasm of skin: Secondary | ICD-10-CM

## 2023-05-24 DIAGNOSIS — I214 Non-ST elevation (NSTEMI) myocardial infarction: Secondary | ICD-10-CM | POA: Diagnosis not present

## 2023-05-24 DIAGNOSIS — R569 Unspecified convulsions: Secondary | ICD-10-CM | POA: Diagnosis not present

## 2023-05-24 DIAGNOSIS — G9341 Metabolic encephalopathy: Secondary | ICD-10-CM | POA: Diagnosis not present

## 2023-05-24 DIAGNOSIS — I444 Left anterior fascicular block: Secondary | ICD-10-CM | POA: Diagnosis present

## 2023-05-24 DIAGNOSIS — E871 Hypo-osmolality and hyponatremia: Secondary | ICD-10-CM | POA: Diagnosis present

## 2023-05-24 DIAGNOSIS — Z8673 Personal history of transient ischemic attack (TIA), and cerebral infarction without residual deficits: Secondary | ICD-10-CM | POA: Diagnosis not present

## 2023-05-24 DIAGNOSIS — G459 Transient cerebral ischemic attack, unspecified: Secondary | ICD-10-CM | POA: Diagnosis not present

## 2023-05-24 DIAGNOSIS — Z515 Encounter for palliative care: Secondary | ICD-10-CM

## 2023-05-24 DIAGNOSIS — Z79899 Other long term (current) drug therapy: Secondary | ICD-10-CM | POA: Diagnosis not present

## 2023-05-24 DIAGNOSIS — I4510 Unspecified right bundle-branch block: Secondary | ICD-10-CM | POA: Diagnosis present

## 2023-05-24 DIAGNOSIS — Z885 Allergy status to narcotic agent status: Secondary | ICD-10-CM

## 2023-05-24 DIAGNOSIS — Z9841 Cataract extraction status, right eye: Secondary | ICD-10-CM

## 2023-05-24 DIAGNOSIS — R29818 Other symptoms and signs involving the nervous system: Secondary | ICD-10-CM | POA: Diagnosis not present

## 2023-05-24 DIAGNOSIS — N1831 Chronic kidney disease, stage 3a: Secondary | ICD-10-CM | POA: Diagnosis present

## 2023-05-24 DIAGNOSIS — E785 Hyperlipidemia, unspecified: Secondary | ICD-10-CM | POA: Diagnosis not present

## 2023-05-24 DIAGNOSIS — I700 Atherosclerosis of aorta: Secondary | ICD-10-CM | POA: Diagnosis not present

## 2023-05-24 DIAGNOSIS — I2510 Atherosclerotic heart disease of native coronary artery without angina pectoris: Secondary | ICD-10-CM | POA: Diagnosis not present

## 2023-05-24 DIAGNOSIS — I130 Hypertensive heart and chronic kidney disease with heart failure and stage 1 through stage 4 chronic kidney disease, or unspecified chronic kidney disease: Secondary | ICD-10-CM | POA: Diagnosis not present

## 2023-05-24 DIAGNOSIS — R55 Syncope and collapse: Secondary | ICD-10-CM | POA: Diagnosis not present

## 2023-05-24 DIAGNOSIS — Z555 Less than a high school diploma: Secondary | ICD-10-CM

## 2023-05-24 DIAGNOSIS — I358 Other nonrheumatic aortic valve disorders: Secondary | ICD-10-CM | POA: Diagnosis not present

## 2023-05-24 DIAGNOSIS — I708 Atherosclerosis of other arteries: Secondary | ICD-10-CM | POA: Diagnosis present

## 2023-05-24 DIAGNOSIS — Z9861 Coronary angioplasty status: Secondary | ICD-10-CM

## 2023-05-24 DIAGNOSIS — J4540 Moderate persistent asthma, uncomplicated: Secondary | ICD-10-CM | POA: Diagnosis not present

## 2023-05-24 DIAGNOSIS — R079 Chest pain, unspecified: Secondary | ICD-10-CM | POA: Diagnosis not present

## 2023-05-24 DIAGNOSIS — I517 Cardiomegaly: Secondary | ICD-10-CM | POA: Diagnosis not present

## 2023-05-24 DIAGNOSIS — Z888 Allergy status to other drugs, medicaments and biological substances status: Secondary | ICD-10-CM | POA: Diagnosis not present

## 2023-05-24 DIAGNOSIS — R1310 Dysphagia, unspecified: Secondary | ICD-10-CM | POA: Diagnosis present

## 2023-05-24 DIAGNOSIS — I5033 Acute on chronic diastolic (congestive) heart failure: Secondary | ICD-10-CM | POA: Diagnosis not present

## 2023-05-24 DIAGNOSIS — E119 Type 2 diabetes mellitus without complications: Secondary | ICD-10-CM | POA: Diagnosis not present

## 2023-05-24 DIAGNOSIS — R441 Visual hallucinations: Secondary | ICD-10-CM | POA: Diagnosis not present

## 2023-05-24 DIAGNOSIS — I25110 Atherosclerotic heart disease of native coronary artery with unstable angina pectoris: Secondary | ICD-10-CM | POA: Diagnosis not present

## 2023-05-24 DIAGNOSIS — E1122 Type 2 diabetes mellitus with diabetic chronic kidney disease: Secondary | ICD-10-CM | POA: Diagnosis not present

## 2023-05-24 DIAGNOSIS — Z9842 Cataract extraction status, left eye: Secondary | ICD-10-CM

## 2023-05-24 DIAGNOSIS — E782 Mixed hyperlipidemia: Secondary | ICD-10-CM | POA: Diagnosis not present

## 2023-05-24 DIAGNOSIS — I252 Old myocardial infarction: Secondary | ICD-10-CM

## 2023-05-24 DIAGNOSIS — I5032 Chronic diastolic (congestive) heart failure: Secondary | ICD-10-CM | POA: Diagnosis not present

## 2023-05-24 DIAGNOSIS — R9089 Other abnormal findings on diagnostic imaging of central nervous system: Secondary | ICD-10-CM | POA: Diagnosis not present

## 2023-05-24 DIAGNOSIS — Z7189 Other specified counseling: Secondary | ICD-10-CM | POA: Diagnosis not present

## 2023-05-24 DIAGNOSIS — R0989 Other specified symptoms and signs involving the circulatory and respiratory systems: Secondary | ICD-10-CM | POA: Diagnosis not present

## 2023-05-24 DIAGNOSIS — Z7984 Long term (current) use of oral hypoglycemic drugs: Secondary | ICD-10-CM

## 2023-05-24 DIAGNOSIS — Z8249 Family history of ischemic heart disease and other diseases of the circulatory system: Secondary | ICD-10-CM

## 2023-05-24 DIAGNOSIS — R0600 Dyspnea, unspecified: Secondary | ICD-10-CM | POA: Diagnosis not present

## 2023-05-24 DIAGNOSIS — Z87891 Personal history of nicotine dependence: Secondary | ICD-10-CM

## 2023-05-24 DIAGNOSIS — Z7951 Long term (current) use of inhaled steroids: Secondary | ICD-10-CM

## 2023-05-24 LAB — URINALYSIS, W/ REFLEX TO CULTURE (INFECTION SUSPECTED)
Bilirubin Urine: NEGATIVE
Glucose, UA: NEGATIVE mg/dL
Ketones, ur: NEGATIVE mg/dL
Leukocytes,Ua: NEGATIVE
Nitrite: NEGATIVE
Protein, ur: 30 mg/dL — AB
RBC / HPF: >50 RBC/hpf (ref 0–5)
Specific Gravity, Urine: 1.013 (ref 1.005–1.030)
pH: 6 (ref 5.0–8.0)

## 2023-05-24 LAB — CBC WITH DIFFERENTIAL/PLATELET
Abs Immature Granulocytes: 0.04 10*3/uL (ref 0.00–0.07)
Basophils Absolute: 0 10*3/uL (ref 0.0–0.1)
Basophils Relative: 0 %
Eosinophils Absolute: 0.1 10*3/uL (ref 0.0–0.5)
Eosinophils Relative: 1 %
HCT: 36.8 % — ABNORMAL LOW (ref 39.0–52.0)
Hemoglobin: 13 g/dL (ref 13.0–17.0)
Immature Granulocytes: 0 %
Lymphocytes Relative: 13 %
Lymphs Abs: 1.2 10*3/uL (ref 0.7–4.0)
MCH: 34 pg (ref 26.0–34.0)
MCHC: 35.3 g/dL (ref 30.0–36.0)
MCV: 96.3 fL (ref 80.0–100.0)
Monocytes Absolute: 1 10*3/uL (ref 0.1–1.0)
Monocytes Relative: 10 %
Neutro Abs: 6.8 10*3/uL (ref 1.7–7.7)
Neutrophils Relative %: 76 %
Platelets: 172 10*3/uL (ref 150–400)
RBC: 3.82 MIL/uL — ABNORMAL LOW (ref 4.22–5.81)
RDW: 12.7 % (ref 11.5–15.5)
WBC: 9.2 10*3/uL (ref 4.0–10.5)
nRBC: 0 % (ref 0.0–0.2)

## 2023-05-24 LAB — TSH: TSH: 2.561 u[IU]/mL (ref 0.350–4.500)

## 2023-05-24 LAB — COMPREHENSIVE METABOLIC PANEL
ALT: 21 U/L (ref 0–44)
AST: 34 U/L (ref 15–41)
Albumin: 3.7 g/dL (ref 3.5–5.0)
Alkaline Phosphatase: 73 U/L (ref 38–126)
Anion gap: 14 (ref 5–15)
BUN: 19 mg/dL (ref 8–23)
CO2: 23 mmol/L (ref 22–32)
Calcium: 8.9 mg/dL (ref 8.9–10.3)
Chloride: 93 mmol/L — ABNORMAL LOW (ref 98–111)
Creatinine, Ser: 1.32 mg/dL — ABNORMAL HIGH (ref 0.61–1.24)
GFR, Estimated: 51 mL/min — ABNORMAL LOW (ref >60)
Glucose, Bld: 194 mg/dL — ABNORMAL HIGH (ref 70–99)
Potassium: 3.9 mmol/L (ref 3.5–5.1)
Sodium: 130 mmol/L — ABNORMAL LOW (ref 135–145)
Total Bilirubin: 1.3 mg/dL — ABNORMAL HIGH (ref 0.3–1.2)
Total Protein: 6.8 g/dL (ref 6.5–8.1)

## 2023-05-24 LAB — MAGNESIUM: Magnesium: 1.4 mg/dL — ABNORMAL LOW (ref 1.7–2.4)

## 2023-05-24 LAB — TROPONIN I (HIGH SENSITIVITY): Troponin I (High Sensitivity): 1131 ng/L (ref <18)

## 2023-05-24 MED ORDER — ALUM & MAG HYDROXIDE-SIMETH 200-200-20 MG/5ML PO SUSP
30.0000 mL | Freq: Once | ORAL | Status: AC
  Administered 2023-05-24: 30 mL via ORAL
  Filled 2023-05-24: qty 30

## 2023-05-24 MED ORDER — LIDOCAINE VISCOUS HCL 2 % MT SOLN
15.0000 mL | Freq: Once | OROMUCOSAL | Status: AC
  Administered 2023-05-24: 15 mL via ORAL
  Filled 2023-05-24: qty 15

## 2023-05-24 MED ORDER — LORAZEPAM 2 MG/ML IJ SOLN
0.5000 mg | Freq: Once | INTRAMUSCULAR | Status: AC
  Administered 2023-05-24: 0.5 mg via INTRAVENOUS
  Filled 2023-05-24: qty 1

## 2023-05-24 NOTE — ED Notes (Signed)
Went to MRI 

## 2023-05-24 NOTE — Consult Note (Signed)
Cardiology Consultation   Patient ID: [name] MRN: [id number]; DOB: 09/15/1929  Admit date: 05/24/2023 Date of Consult: 05/25/2023  PCP:  Dettinger, Elige Radon, MD   [facility] Providers Cardiologist:  Rollene Rotunda, MD Click here to update MD or APP on Care Team, Refresh:1}     Patient Profile:   [name] is a 89 y.o. male with a hx of MI with atherectomy of 90% LAD stenosis (1996), medical management of 70% LCx and 50% RCA, pAF on Rivaroxaban, paradoxical low flow low gradient aortic stenosis managed conservatively, iliac artery aneurysm, HLD who is being seen 05/25/2023 for the evaluation of elevated troponin at the request of [employer].  History of Present Illness:   History limited by patient somnolence after ativan administration.  [name] was in his usual state of health when he developed burning chest discomfort shortly after breakfast this morning that he initially attributed to indigestion. His family reports he then developed altered mental status for roughly 20 minutes. During this period he was unable to speak or follow commands. This prompted his presentation to the ED. The family reports he was back to his baseline shortly after arrival to the ED. Due to concern for CVA a CT and MRI of the brain were completed.   CT scan of the head showed no acute infarct and chronic ischemic changes with MRI completed and pending read. Troponin resulted at 1,131.  His last dose of Rivaroxaban was at 1700 on 05/24/23.  He was last seen by Dr. Antoine Poche in 04/2022 and at that time he preferred to manage his severe AS conservatively as his limitations were mainly due to joint pain rather than dyspnea. He was continued on Rivaroxaban for AF.   Past Medical History:  Diagnosis Date   Arthritis    "wrists, knees, back" (12/10/2015)   Asthma    BCC (basal cell carcinoma of skin) 01/28/2017   left inner cheek--sup & nod (CX3, 5FU)   BCC (basal cell  carcinoma) 01/26/2017   left chin (molts)   BCC (basal cell carcinoma) 04/01/2017   midback--sup & nod (tx p bx)   BCC (basal cell carcinoma) 10/13/2017   above left outer eye brow--infiltrative (CX3, 5FU)   BCC (basal cell carcinoma) 06/02/2018   right post forearm--nod (tx p bx)   BCC (basal cell carcinoma) 03/09/2019   right mid outer cheek--nod--(tx p bx)   Cerebral atherosclerosis    Chronic back pain    "lower primarily" (12/10/2015)   CORONARY ATHEROSCLEROSIS NATIVE CORONARY ARTERY    Myocardial infarction at [facility] in 1996 with a directional atherectomy of the 90% LAD stenosis and medical management of 60-70% proximal circumflex and 50% RCA stenosis.   DYSLIPIDEMIA    GERD (gastroesophageal reflux disease)    History of hiatal hernia    HYPERTENSION    ILIAC ARTERY ANEURYSM    Myocardial infarction (HCC) 1996   SCC (squamous cell carcinoma) 10/29/2016   left ear--in situ (tx p bx)   SCC (squamous cell carcinoma) 01/26/2017   left sideburn--well diff--(CX3, 5FU)   SCC (squamous cell carcinoma) 01/28/2017   left upper cheek--in situ (CX3, 5FU)   SCC (squamous cell carcinoma) 05/06/2017   left cheek--sup &well diff (tx p bx)   SCC (squamous cell carcinoma) 10/13/2017   right outer forearm--in situ (CX3 5FU)   SCC (squamous cell carcinoma) 10/13/2017   right ouer forarm--well diff (CX3 5FU)   SCC (squamous cell carcinoma) 10/13/2017   left sideburn,  superior--well diff (CX3, 5FU)   SCC (squamous cell carcinoma) 06/02/2018   right forearm--in situ (tx p bx)   SCC (squamous cell carcinoma) 03/09/2019   left zy goma--well diff (tx p bx)   SCC (squamous cell carcinoma) 03/09/2019   right pre auricular--well diff (tx p bx)   SCCA (squamous cell carcinoma) of skin 05/22/2021   Left Buccal Cheek (in situ)   Stroke (HCC) 2003; 2013; 07/2015; 12/05/2015   denies residual on 12/10/2015, 12/13/16, 01/08/17   TIA (transient ischemic attack)    Type II diabetes mellitus (HCC)     UPPER RESPIRATORY INFECTION     Past Surgical History:  Procedure Laterality Date   ANAL FISSURE REPAIR  1990s   BASAL CELL CARCINOMA EXCISION Right 12/2015   "temple"   CATARACT EXTRACTION W/ INTRAOCULAR LENS  IMPLANT, BILATERAL Bilateral 2000s   CORONARY ANGIOPLASTY  1996   KNEE CARTILAGE SURGERY Right 1970s   LOOP RECORDER INSERTION N/A 12/15/2016   Procedure: Loop Recorder Insertion;  Surgeon: Hillis Range, MD;  Location: [facility] INVASIVE CV LAB;  Service: Cardiovascular;  Laterality: N/A;   SHOULDER ARTHROSCOPY W/ ROTATOR CUFF REPAIR Left 2000s   TEE WITHOUT CARDIOVERSION N/A 12/15/2016   Procedure: TRANSESOPHAGEAL ECHOCARDIOGRAM (TEE);  Surgeon: Lewayne Bunting, MD;  Location: [facility] ENDOSCOPY;  Service: Cardiovascular;  Laterality: N/A;     Home Medications:  Prior to Admission medications   Medication Sig Start Date End Date Taking? Authorizing Provider  Accu-Chek FastClix Lancets MISC Check BS up to 4 TIMES DAILY Dx E11.9 07/15/20   Dettinger, Elige Radon, MD  acetaminophen (TYLENOL) 325 MG tablet Take 650 mg by mouth every 6 (six) hours as needed for mild pain, fever or headache.    [provider]  ADVAIR DISKUS 100-50 MCG/ACT AEPB INHALE 1 PUFF INTO LUNGS EVERY 12 HOURS 04/14/23   Dettinger, Elige Radon, MD  albuterol (VENTOLIN HFA) 108 (90 Base) MCG/ACT inhaler INHALE 1 TO 2 PUFFS BY MOUTH EVERY 6 HOURS AS NEEDED FOR WHEEZING OR SHORTNESS OF BREATH 04/15/23   Dettinger, Elige Radon, MD  atorvastatin (LIPITOR) 80 MG tablet Take 1 tablet (80 mg total) by mouth daily. 01/14/23   Dettinger, Elige Radon, MD  benazepril (LOTENSIN) 20 MG tablet Take 1 tablet (20 mg total) by mouth daily. 01/14/23   Dettinger, Elige Radon, MD  blood glucose meter kit and supplies KIT Dispense based on patient and insurance preference. Use four times daily as directed.  Diagnosis type 2 diabetes 05/10/19   Dettinger, Elige Radon, MD  Cholecalciferol (VITAMIN D) 2000 units CAPS Take 2,000 Units by mouth daily.    [provider]  feeding supplement, GLUCERNA SHAKE, (GLUCERNA SHAKE) LIQD Take 237 mLs by mouth 2 (two) times daily between meals. 12/13/15   Elenora Gamma, MD  fish oil-omega-3 fatty acids 1000 MG capsule Take 2 g by mouth every evening.    [provider]  glucose blood (ACCU-CHEK GUIDE) test strip USE 1 STRIP TO CHECK GLUCOSE UP TO 4 TIMES DAILY 06/19/22   Dettinger, Elige Radon, MD  hydrALAZINE (APRESOLINE) 25 MG tablet Take 1 tablet (25 mg total) by mouth daily as needed. 01/14/23   Dettinger, Elige Radon, MD  hydrochlorothiazide (HYDRODIURIL) 25 MG tablet Take 1 tablet by mouth once daily 02/16/23   Dettinger, Elige Radon, MD  metFORMIN (GLUCOPHAGE-XR) 500 MG 24 hr tablet Take 1 tablet (500 mg total) by mouth daily with breakfast. 01/14/23   Dettinger, Elige Radon, MD  metoprolol tartrate (LOPRESSOR) 100 MG tablet  Take 1 tablet (100 mg total) by mouth 2 (two) times daily. 01/14/23   Dettinger, Elige Radon, MD  nitroGLYCERIN (NITROSTAT) 0.4 MG SL tablet USE AS DIRECTED 06/08/22   Dettinger, Elige Radon, MD  pantoprazole (PROTONIX) 40 MG tablet Take 1 tablet (40 mg total) by mouth daily. 01/14/23   Dettinger, Elige Radon, MD  rivaroxaban (XARELTO) 20 MG TABS tablet Take 1 tablet (20 mg total) by mouth daily with supper. 01/14/23   Dettinger, Elige Radon, MD    Inpatient Medications: Scheduled Meds:  Continuous Infusions:  PRN Meds:   Allergies:    Allergies  Allergen Reactions   Alteplase Anaphylaxis   Codeine Other (See Comments)    Bad dreams   Lopid [Gemfibrozil] Nausea Only   Voltaren [Diclofenac Sodium] Diarrhea    Social History:   Social History   Socioeconomic History   Marital status: Married    Spouse name: Vera   Number of children: 3   Years of education: Not on file   Highest education level: 7th grade  Occupational History   Occupation: Retired    Comment: [employer]  Tobacco Use   Smoking status: Former    Current packs/day: 0.00    Average packs/day: 0.1  packs/day for 45.0 years (5.4 ttl pk-yrs)    Types: Cigarettes    Start date: 11/25/1939    Quit date: 11/24/1984    Years since quitting: 38.5   Smokeless tobacco: Former    Types: Snuff   Tobacco comments:    "dipped snuff when I was a boy"  Vaping Use   Vaping status: Never Used  Substance and Sexual Activity   Alcohol use: No    Alcohol/week: 0.0 standard drinks of alcohol   Drug use: No   Sexual activity: Not Currently  Other Topics Concern   Not on file  Social History Narrative   One level living home with wife, Dwana Curd - she cooks every Sunday for the family of about 41 - children to great grandchildren   Social Determinants of Health   Financial Resource Strain: Low Risk  (01/07/2023)   Overall Financial Resource Strain (CARDIA)    Difficulty of Paying Living Expenses: Not hard at all  Food Insecurity: No Food Insecurity (01/07/2023)   Hunger Vital Sign    Worried About Running Out of Food in the Last Year: Never true    Ran Out of Food in the Last Year: Never true  Transportation Needs: No Transportation Needs (01/07/2023)   PRAPARE - Administrator, Civil Service (Medical): No    Lack of Transportation (Non-Medical): No  Physical Activity: Insufficiently Active (01/07/2023)   Exercise Vital Sign    Days of Exercise per Week: 3 days    Minutes of Exercise per Session: 30 min  Stress: No Stress Concern Present (01/07/2023)   [employer] of Occupational Health - Occupational Stress Questionnaire    Feeling of Stress : Not at all  Social Connections: Moderately Integrated (01/07/2023)   Social Connection and Isolation Panel [NHANES]    Frequency of Communication with Friends and Family: More than three times a week    Frequency of Social Gatherings with Friends and Family: More than three times a week    Attends Religious Services: More than 4 times per year    Active Member of [employer] or Organizations: No    Attends Banker Meetings: Never    Marital  Status: Married  Catering manager Violence: Not At Risk (01/07/2023)  Humiliation, Afraid, Rape, and Kick questionnaire    Fear of Current or Ex-Partner: No    Emotionally Abused: No    Physically Abused: No    Sexually Abused: No    Family History:    Family History  Problem Relation Age of Onset   Coronary artery disease Mother    Cancer Father        lung   Stroke Father    Cancer Brother        lung   Stroke Sister      ROS:  Please see the history of present illness.   All other ROS reviewed and negative.     Physical Exam/Data:   Vitals:   05/24/23 2345 05/25/23 0000 05/25/23 0015 05/25/23 0030  BP: 117/69 130/71 138/73 122/62  Pulse: 83 79 75 76  Resp: (!) 32 (!) 23 20 18   Temp:      TempSrc:      SpO2: 100% 100% 99% 99%   No intake or output data in the 24 hours ending 05/25/23 0106    04/21/2023   10:47 AM 01/14/2023   10:07 AM 01/07/2023    9:49 AM  Last 3 Weights  Weight (lbs) 188 lb 3.2 oz 183 lb 196 lb  Weight (kg) 85.367 kg 83.008 kg 88.905 kg     There is no height or weight on file to calculate BMI.  General:  Elderly male, somnolent, no distress HEENT: normal Neck: no JVD Vascular: No carotid bruits; Distal pulses 2+ bilaterally Cardiac:  RRR; systolic murmur loudest at the aortic area with S2 present Lungs:  rales bilaterally, no increased work of breathing Abd: soft, nontender, no hepatomegaly  Ext: trace edema Musculoskeletal:  No deformities, BUE and BLE strength normal and equal Skin: multiple skin lesions over the facial skin Neuro:  CNs 2-12 intact, no focal abnormalities noted Psych:  Normal affect   EKG:  The EKG was personally reviewed and demonstrates:  normal sinus rhythm w/ RBBB, LAFB Telemetry:  Telemetry was personally reviewed and demonstrates:  NSR  Relevant CV Studies: Echo 01/2022   1. Left ventricular ejection fraction, by estimation, is 55 to 60%. The  left ventricle has normal function. The left ventricle has no  regional  wall motion abnormalities. There is mild left ventricular hypertrophy.  Left ventricular diastolic parameters  are consistent with Grade I diastolic dysfunction (impaired relaxation).  Possible small muscular VSD.   2. Right ventricular systolic function is normal. The right ventricular  size is mildly enlarged. There is mildly elevated pulmonary artery  systolic pressure. The estimated right ventricular systolic pressure is  44.2 mmHg.   3. Left atrial size was moderately dilated.   4. Right atrial size was mildly dilated.   5. The mitral valve is normal in structure. Trivial mitral valve  regurgitation. No evidence of mitral stenosis.   6. The aortic valve is tricuspid. There is severe calcifcation of the  aortic valve. Aortic valve regurgitation is trivial. Suspect paradoxical  low flow/low gradient severe aortic valve stenosis. Aortic valve area, by  VTI measures 0.74 cm. Aortic valve  mean gradient measures 30.0 mmHg.   7. The pulmonic valve was abnormal. Pulmonic valve regurgitation is  moderate.   8. The inferior vena cava is normal in size with greater than 50%  respiratory variability, suggesting right atrial pressure of 3 mmHg.   Laboratory Data:  High Sensitivity Troponin:   Recent Labs  Lab 05/24/23 2126 05/24/23 2345  TROPONINIHS 1,131* 1,083*  Chemistry Recent Labs  Lab 05/24/23 2126  NA 130*  K 3.9  CL 93*  CO2 23  GLUCOSE 194*  BUN 19  CREATININE 1.32*  CALCIUM 8.9  MG 1.4*  GFRNONAA 51*  ANIONGAP 14    Recent Labs  Lab 05/24/23 2126  PROT 6.8  ALBUMIN 3.7  AST 34  ALT 21  ALKPHOS 73  BILITOT 1.3*   Lipids No results for input(s): "CHOL", "TRIG", "HDL", "LABVLDL", "LDLCALC", "CHOLHDL" in the last 168 hours.  Hematology Recent Labs  Lab 05/24/23 2126  WBC 9.2  RBC 3.82*  HGB 13.0  HCT 36.8*  MCV 96.3  MCH 34.0  MCHC 35.3  RDW 12.7  PLT 172   Thyroid  Recent Labs  Lab 05/24/23 2126  TSH 2.561    BNPNo results  for input(s): "BNP", "PROBNP" in the last 168 hours.  DDimer No results for input(s): "DDIMER" in the last 168 hours.   Radiology/Studies:  DG Chest Portable 1 View  Result Date: 05/24/2023 CLINICAL DATA:  Chest pain and burning, initial encounter EXAM: PORTABLE CHEST 1 VIEW COMPARISON:  07/09/2016 FINDINGS: Cardiac shadow is enlarged but stable. Aortic calcifications are noted. Loop recorder is seen. The lungs are well aerated bilaterally without focal infiltrate or effusion. Mild central vascular congestion is noted. No bony abnormality is noted. IMPRESSION: Mild central vascular congestion. Electronically Signed   By: Alcide Clever M.D.   On: 05/24/2023 23:10   CT HEAD WO CONTRAST ( )  Result Date: 05/24/2023 CLINICAL DATA:  Altered mental status EXAM: CT HEAD WITHOUT CONTRAST TECHNIQUE: Contiguous axial images were obtained from the base of the skull through the vertex without intravenous contrast. RADIATION DOSE REDUCTION: This exam was performed according to the departmental dose-optimization program which includes automated exposure control, adjustment of the mA and/or kV according to patient size and/or use of iterative reconstruction technique. COMPARISON:  06/27/2019 FINDINGS: Brain: No evidence of acute infarction, hemorrhage, hydrocephalus, extra-axial collection or mass lesion/mass effect. Chronic atrophic and ischemic changes are noted. Vascular: No hyperdense vessel or unexpected calcification. Skull: Normal. Negative for fracture or focal lesion. Sinuses/Orbits: No acute finding. Other: None. IMPRESSION: Chronic atrophic and ischemic changes stable from the prior exam. Electronically Signed   By: Alcide Clever M.D.   On: 05/24/2023 23:10     Assessment and Plan:   NSTEMI - Chest pain syndrome with elevated troponin. He would prefer to avoid invasive procedures currently. Will manage medically for now as he is currently asymptomatic and troponin is down-trending. - Recommend  continuation of Rivaroxaban for anticoagulation given no plan for invasive procedure  - ASA 324 mg now and follow by ASA 81 mg daily Aortic stenosis - Paradoxical low flow, low gradient AS. Previously managed conservatively given age and patient desire to avoid procedures. This has not changed based on my discussion with him overnight.  Dyspnea - volume overloaded on exam and elevated BNP. Lasix 40 mg IV ordered. Atrial fibrillation - continue rivaroxaban, metoprolol   Risk Assessment/Risk Scores:     TIMI Risk Score for Unstable Angina or Non-ST Elevation MI:   The patient's TIMI risk score is 4, which indicates a 20% risk of all cause mortality, new or recurrent myocardial infarction or need for urgent revascularization in the next 14 days.  New York Heart Association (NYHA) Functional Class NYHA Class III  CHA2DS2-VASc Score =   6  This indicates a 11.2% annual risk of stroke. The patient's score is based upon: HTN - 1 Age - 2  TIA - 2 DM - 1 CAD/MI - 1  For questions or updates, please contact [facility] Please consult [email or web address] for contact info under    Signed, Roderic Palau, MD  05/25/2023 1:06 AM

## 2023-05-24 NOTE — ED Provider Notes (Signed)
[name] AT [facility] Provider Note   CSN: [id number] Arrival date & time: 05/24/23  2034     History  Chief Complaint  Patient presents with   Altered Mental Status    [name] is a 89 y.o. male.  The history is provided by the patient and medical records. No language interpreter was used.  Altered Mental Status Presenting symptoms: confusion and partial responsiveness   Severity:  Severe Most recent episode:  Today Episode history:  Single Duration:  20 minutes Timing:  Constant Progression:  Resolved Chronicity:  Recurrent Associated symptoms: no abdominal pain, no agitation, no fever, no headaches, no light-headedness, no nausea, no palpitations, no rash, no seizures and no vomiting        Home Medications Prior to Admission medications   Medication Sig Start Date End Date Taking? Authorizing Provider  Accu-Chek FastClix Lancets MISC Check BS up to 4 TIMES DAILY Dx E11.9 07/15/20   Dettinger, Elige Radon, MD  acetaminophen (TYLENOL) 325 MG tablet Take 650 mg by mouth every 6 (six) hours as needed for mild pain, fever or headache.    [provider]  ADVAIR DISKUS 100-50 MCG/ACT AEPB INHALE 1 PUFF INTO LUNGS EVERY 12 HOURS 04/14/23   Dettinger, Elige Radon, MD  albuterol (VENTOLIN HFA) 108 (90 Base) MCG/ACT inhaler INHALE 1 TO 2 PUFFS BY MOUTH EVERY 6 HOURS AS NEEDED FOR WHEEZING OR SHORTNESS OF BREATH 04/15/23   Dettinger, Elige Radon, MD  atorvastatin (LIPITOR) 80 MG tablet Take 1 tablet (80 mg total) by mouth daily. 01/14/23   Dettinger, Elige Radon, MD  benazepril (LOTENSIN) 20 MG tablet Take 1 tablet (20 mg total) by mouth daily. 01/14/23   Dettinger, Elige Radon, MD  blood glucose meter kit and supplies KIT Dispense based on patient and insurance preference. Use four times daily as directed.  Diagnosis type 2 diabetes 05/10/19   Dettinger, Elige Radon, MD  Cholecalciferol (VITAMIN D) 2000 units CAPS Take 2,000 Units by mouth daily.     [provider]  feeding supplement, GLUCERNA SHAKE, (GLUCERNA SHAKE) LIQD Take 237 mLs by mouth 2 (two) times daily between meals. 12/13/15   Elenora Gamma, MD  fish oil-omega-3 fatty acids 1000 MG capsule Take 2 g by mouth every evening.    [provider]  glucose blood (ACCU-CHEK GUIDE) test strip USE 1 STRIP TO CHECK GLUCOSE UP TO 4 TIMES DAILY 06/19/22   Dettinger, Elige Radon, MD  hydrALAZINE (APRESOLINE) 25 MG tablet Take 1 tablet (25 mg total) by mouth daily as needed. 01/14/23   Dettinger, Elige Radon, MD  hydrochlorothiazide (HYDRODIURIL) 25 MG tablet Take 1 tablet by mouth once daily 02/16/23   Dettinger, Elige Radon, MD  metFORMIN (GLUCOPHAGE-XR) 500 MG 24 hr tablet Take 1 tablet (500 mg total) by mouth daily with breakfast. 01/14/23   Dettinger, Elige Radon, MD  metoprolol tartrate (LOPRESSOR) 100 MG tablet Take 1 tablet (100 mg total) by mouth 2 (two) times daily. 01/14/23   Dettinger, Elige Radon, MD  nitroGLYCERIN (NITROSTAT) 0.4 MG SL tablet USE AS DIRECTED 06/08/22   Dettinger, Elige Radon, MD  pantoprazole (PROTONIX) 40 MG tablet Take 1 tablet (40 mg total) by mouth daily. 01/14/23   Dettinger, Elige Radon, MD  rivaroxaban (XARELTO) 20 MG TABS tablet Take 1 tablet (20 mg total) by mouth daily with supper. 01/14/23   Dettinger, Elige Radon, MD      Allergies    Alteplase, Codeine, Lopid [gemfibrozil], and Voltaren [diclofenac  sodium]    Review of Systems   Review of Systems  Constitutional:  Negative for chills, diaphoresis, fatigue and fever.  HENT:  Negative for congestion.   Eyes:  Negative for visual disturbance.  Respiratory:  Negative for cough, chest tightness, shortness of breath and wheezing.   Cardiovascular:  Negative for chest pain (chest burning), palpitations and leg swelling.  Gastrointestinal:  Negative for abdominal pain, constipation, diarrhea, nausea and vomiting.  Genitourinary:  Negative for dysuria and flank pain.  Musculoskeletal:  Negative for back pain,  neck pain and neck stiffness.  Skin:  Negative for rash and wound.  Neurological:  Negative for seizures, light-headedness, numbness and headaches.  Psychiatric/Behavioral:  Positive for confusion. Negative for agitation.   All other systems reviewed and are negative.   Physical Exam Updated Vital Signs BP (!) 169/99 (BP Location: Right Arm)   Pulse 85   Temp (!) 97.2 F (36.2 C) (Oral)   Resp 14   SpO2 97%  Physical Exam Vitals and nursing note reviewed.  Constitutional:      General: He is not in acute distress.    Appearance: He is well-developed. He is not ill-appearing, toxic-appearing or diaphoretic.  HENT:     Head: Normocephalic and atraumatic.     Nose: No congestion or rhinorrhea.     Mouth/Throat:     Mouth: Mucous membranes are moist.     Pharynx: No oropharyngeal exudate or posterior oropharyngeal erythema.  Eyes:     Extraocular Movements: Extraocular movements intact.     Conjunctiva/sclera: Conjunctivae normal.     Pupils: Pupils are equal, round, and reactive to light.  Neck:     Vascular: No carotid bruit.  Cardiovascular:     Rate and Rhythm: Normal rate and regular rhythm.     Heart sounds: No murmur heard. Pulmonary:     Effort: Pulmonary effort is normal. No respiratory distress.     Breath sounds: Normal breath sounds. No wheezing, rhonchi or rales.  Chest:     Chest wall: No tenderness.  Abdominal:     General: Abdomen is flat.     Palpations: Abdomen is soft.     Tenderness: There is no abdominal tenderness. There is no guarding or rebound.  Musculoskeletal:        General: No swelling or tenderness.     Cervical back: Neck supple. No tenderness.     Right lower leg: No edema.     Left lower leg: No edema.  Skin:    General: Skin is warm and dry.     Capillary Refill: Capillary refill takes less than 2 seconds.     Findings: No erythema or rash.  Neurological:     General: No focal deficit present.     Mental Status: He is alert. Mental  status is at baseline.     Sensory: No sensory deficit.     Motor: No weakness.  Psychiatric:        Mood and Affect: Mood normal.     ED Results / Procedures / Treatments   Labs (all labs ordered are listed, but only abnormal results are displayed) Labs Reviewed  CBC WITH DIFFERENTIAL/PLATELET - Abnormal; Notable for the following components:      Result Value   RBC 3.82 (*)    HCT 36.8 (*)    All other components within normal limits  COMPREHENSIVE METABOLIC PANEL - Abnormal; Notable for the following components:   Sodium 130 (*)    Chloride 93 (*)  Glucose, Bld 194 (*)    Creatinine, Ser 1.32 (*)    Total Bilirubin 1.3 (*)    GFR, Estimated 51 (*)    All other components within normal limits  MAGNESIUM - Abnormal; Notable for the following components:   Magnesium 1.4 (*)    All other components within normal limits  URINALYSIS, W/ REFLEX TO CULTURE (INFECTION SUSPECTED) - Abnormal; Notable for the following components:   Hgb urine dipstick MODERATE (*)    Protein, ur 30 (*)    Bacteria, UA RARE (*)    All other components within normal limits  TROPONIN I (HIGH SENSITIVITY) - Abnormal; Notable for the following components:   Troponin I (High Sensitivity) 1,131 (*)    All other components within normal limits  TSH  TROPONIN I (HIGH SENSITIVITY)    EKG EKG Interpretation Date/Time:  Monday May 24 2023 20:53:12 EDT Ventricular Rate:  76 PR Interval:    QRS Duration:  140 QT Interval:  421 QTC Calculation: 474 R Axis:   187  Text Interpretation: Atrial fibrillation Nonspecific intraventricular conduction delay Borderline ST depression, diffuse leads when compared to prior, new conducxtion delay. No STEMI Confirmed by Theda Belfast ([id number]) on 05/24/2023 8:54:11 PM  Radiology DG Chest Portable 1 View  Result Date: 05/24/2023 CLINICAL DATA:  Chest pain and burning, initial encounter EXAM: PORTABLE CHEST 1 VIEW COMPARISON:  07/09/2016 FINDINGS: Cardiac shadow  is enlarged but stable. Aortic calcifications are noted. Loop recorder is seen. The lungs are well aerated bilaterally without focal infiltrate or effusion. Mild central vascular congestion is noted. No bony abnormality is noted. IMPRESSION: Mild central vascular congestion. Electronically Signed   By: Alcide Clever M.D.   On: 05/24/2023 23:10   CT HEAD WO CONTRAST ( )  Result Date: 05/24/2023 CLINICAL DATA:  Altered mental status EXAM: CT HEAD WITHOUT CONTRAST TECHNIQUE: Contiguous axial images were obtained from the base of the skull through the vertex without intravenous contrast. RADIATION DOSE REDUCTION: This exam was performed according to the departmental dose-optimization program which includes automated exposure control, adjustment of the mA and/or kV according to patient size and/or use of iterative reconstruction technique. COMPARISON:  06/27/2019 FINDINGS: Brain: No evidence of acute infarction, hemorrhage, hydrocephalus, extra-axial collection or mass lesion/mass effect. Chronic atrophic and ischemic changes are noted. Vascular: No hyperdense vessel or unexpected calcification. Skull: Normal. Negative for fracture or focal lesion. Sinuses/Orbits: No acute finding. Other: None. IMPRESSION: Chronic atrophic and ischemic changes stable from the prior exam. Electronically Signed   By: Alcide Clever M.D.   On: 05/24/2023 23:10    Procedures Procedures    Medications Ordered in ED Medications  alum & mag hydroxide-simeth (MAALOX/MYLANTA) 200-200-20 MG/5ML suspension 30 mL (30 mLs Oral Given 05/24/23 2200)    And  lidocaine (XYLOCAINE) 2 % viscous mouth solution 15 mL (15 mLs Oral Given 05/24/23 2200)  LORazepam (ATIVAN) injection 0.5 mg (0.5 mg Intravenous Given 05/24/23 2227)    ED Course/ Medical Decision Making/ A&P                                 Medical Decision Making Amount and/or Complexity of Data Reviewed Labs: ordered. Radiology: ordered.  Risk OTC drugs. Prescription  drug management.   [name] is a 89 y.o. male with a past medical history significant for CAD with previous MI, previous stroke, previous TIA, diabetes, asthma, dyslipidemia, and atrial fibrillation on Xarelto therapy who presents for  transient altered mental status and some chest burning.  According to patient, he had some chest burning this evening and thought it was due to spicy food but family then confirmed he has not had any food today other than a sausage biscuit this morning.  Around 7 PM, he had about 20 minutes of altered mental status where he was less responsive and not answering questions appropriately.  Patient reports he thought he could not get out what he wanted to say and said it felt similar to when he had TIA in the past.  Denies significant headache, neck pain, fevers, or chills.  Denies any trauma.  Denies any nausea, vomiting, constipation, diarrhea, or urinary changes.  Denies any sick exposures.  Family was concerned about stroke or TIA recurrence.  On exam, lungs were clear.  Chest was nontender.  Patient has a murmur.  Abdomen nontender.  Patient has intact sensation and strength in extremities.  Normal finger-nose-finger testing bilaterally.  Symmetric smile.  Clear speech.  Pupils are symmetric and reactive with normal extraocular movements.  No evidence of acute trauma.  Given the patient's approximately 20 minutes of altered mental status and aphasia, will get imaging to rule out bleed, TIA, or stroke.  Will get CT and MRI.  Will get screening labs.  Will give a GI cocktail due to the burning that patient thinks was due to spicy food.  Will get troponins and lab workup.  Will get infectious workup include urinalysis to look for UTI that could have caused altered mental status.  Anticipate reassessment after workup to determine disposition.  11:05 PM Was just informed that the patient troponin is over thousand.  Given this "chest burning" will call cardiology.  EKG  does appear different than prior.  He was denying chest discomfort when I was last assessing him.  He has an MRI now.  Will call cardiology and anticipate admission.  Will still wait for the results of his CT and MRI given the transient altered mental status.  Cardiology will come see the patient to discuss admission.  They recommended giving a full aspirin if his MRI does not show acute stroke.  Care transferred to oncoming team to await reassessment and delta troponin and admission.        Final Clinical Impression(s) / ED Diagnoses Final diagnoses:  Transient neurological symptoms  Burning in the chest     Clinical Impression: 1. Transient neurological symptoms   2. Burning in the chest     Disposition: Admit  This note was prepared with assistance of Dragon voice recognition software. Occasional wrong-word or sound-a-like substitutions may have occurred due to the inherent limitations of voice recognition software.      Kamyiah Colantonio, Canary Brim, MD 05/24/23 [phone]

## 2023-05-24 NOTE — ED Triage Notes (Signed)
BIB [facility] from home, family found pt to be more altered than normal around 1900-1915, has a history of TIA and usually has a hard time coming back to baseline but normally A/ox4. No falls or LOC witnessed by family or EMS  Hx of TIA, Afib

## 2023-05-24 NOTE — Consult Note (Incomplete)
Cardiology Consultation   Patient ID: [name] MRN: [id number]; DOB: 06-28-1930  Admit date: 05/24/2023 Date of Consult: 05/24/2023  PCP:  Dettinger, Elige Radon, MD   [facility] Providers Cardiologist:  Rollene Rotunda, MD   { Click here to update MD or APP on Care Team, Refresh:1}     Patient Profile:   [name] is a 89 y.o. male with a hx of MI with atherectomy of 90% LAD stenosis (1996), medical management of 70% LCx and 50% RCA, pAF on Rivaroxaban, paradoxical low flow low gradient aortic stenosis managed conservatively, iliac artery aneurysm, HLD who is being seen 05/24/2023 for the evaluation of elevated troponin at the request of [employer].  History of Present Illness:   Mr. Corson was in his usual state of health when he developed a burning chest discomfort shortly after breakfast this morning. His family reports he then developed aphasia for roughly 20 minutes which prompted his presentation to the ED.  CT scan of the head showed no acute infarct and chronic ischemic changes with MRI completed and pending read.  His last dose of Rivaroxaban was at 1700 on 05/24/23.  He was last seen by Dr. Antoine Poche in 04/2022 and at that time he preferred to manage his AS conservatively and he was continued on Rivaroxaban for AF.   Past Medical History:  Diagnosis Date  . Arthritis    "wrists, knees, back" (12/10/2015)  . Asthma   . BCC (basal cell carcinoma of skin) 01/28/2017   left inner cheek--sup & nod (CX3, 5FU)  . BCC (basal cell carcinoma) 01/26/2017   left chin (molts)  . BCC (basal cell carcinoma) 04/01/2017   midback--sup & nod (tx p bx)  . BCC (basal cell carcinoma) 10/13/2017   above left outer eye brow--infiltrative (CX3, 5FU)  . BCC (basal cell carcinoma) 06/02/2018   right post forearm--nod (tx p bx)  . BCC (basal cell carcinoma) 03/09/2019   right mid outer cheek--nod--(tx p bx)  . Cerebral atherosclerosis   . Chronic back pain     "lower primarily" (12/10/2015)  . CORONARY ATHEROSCLEROSIS NATIVE CORONARY ARTERY    Myocardial infarction at [facility] in 1996 with a directional atherectomy of the 90% LAD stenosis and medical management of 60-70% proximal circumflex and 50% RCA stenosis.  . DYSLIPIDEMIA   . GERD (gastroesophageal reflux disease)   . History of hiatal hernia   . HYPERTENSION   . ILIAC ARTERY ANEURYSM   . Myocardial infarction (HCC) 1996  . SCC (squamous cell carcinoma) 10/29/2016   left ear--in situ (tx p bx)  . SCC (squamous cell carcinoma) 01/26/2017   left sideburn--well diff--(CX3, 5FU)  . SCC (squamous cell carcinoma) 01/28/2017   left upper cheek--in situ (CX3, 5FU)  . SCC (squamous cell carcinoma) 05/06/2017   left cheek--sup &well diff (tx p bx)  . SCC (squamous cell carcinoma) 10/13/2017   right outer forearm--in situ (CX3 5FU)  . SCC (squamous cell carcinoma) 10/13/2017   right ouer forarm--well diff (CX3 5FU)  . SCC (squamous cell carcinoma) 10/13/2017   left sideburn, superior--well diff (CX3, 5FU)  . SCC (squamous cell carcinoma) 06/02/2018   right forearm--in situ (tx p bx)  . SCC (squamous cell carcinoma) 03/09/2019   left zy goma--well diff (tx p bx)  . SCC (squamous cell carcinoma) 03/09/2019   right pre auricular--well diff (tx p bx)  . SCCA (squamous cell carcinoma) of skin 05/22/2021   Left Buccal Cheek (in situ)  . Stroke (  HCC) 2003; 2013; 07/2015; 12/05/2015   denies residual on 12/10/2015, 12/13/16, 01/08/17  . TIA (transient ischemic attack)   . Type II diabetes mellitus (HCC)   . UPPER RESPIRATORY INFECTION     Past Surgical History:  Procedure Laterality Date  . ANAL FISSURE REPAIR  1990s  . BASAL CELL CARCINOMA EXCISION Right 12/2015   "temple"  . CATARACT EXTRACTION W/ INTRAOCULAR LENS  IMPLANT, BILATERAL Bilateral 2000s  . CORONARY ANGIOPLASTY  1996  . KNEE CARTILAGE SURGERY Right 1970s  . LOOP RECORDER INSERTION N/A 12/15/2016   Procedure: Loop Recorder  Insertion;  Surgeon: Hillis Range, MD;  Location: [facility]AS[facility];  Service: Cardiovascular;  Laterality: N/A;  . SHOULDER ARTHROSCOPY W/ ROTATOR CUFF REPAIR Left 2000s  . TEE WITHOUT CARDIOVERSION N/A 12/15/2016   Procedure: TRANSESOPHAGEAL ECHOCARDIOGRAM (TEE);  Surgeon: Lewayne Bunting, MD;  Location: [facility];  Service: Cardiovascular;  Laterality: N/A;     Home Medications:  Prior to Admission medications   Medication Sig Start Date End Date Taking? Authorizing Provider  Accu-Chek FastClix Lancets MISC Check BS up to 4 TIMES DAILY Dx E11.9 07/15/20   Dettinger, Elige Radon, MD  acetaminophen (TYLENOL) 325 MG tablet Take 650 mg by mouth every 6 (six) hours as needed for mild pain, fever or headache.    [provider]  ADVAIR DISKUS 100-50 MCG/ACT AEPB INHALE 1 PUFF INTO LUNGS EVERY 12 HOURS 04/14/23   Dettinger, Elige Radon, MD  albuterol (VENTOLIN HFA) 108 (90 Base) MCG/ACT inhaler INHALE 1 TO 2 PUFFS BY MOUTH EVERY 6 HOURS AS NEEDED FOR WHEEZING OR SHORTNESS OF BREATH 04/15/23   Dettinger, Elige Radon, MD  atorvastatin (LIPITOR) 80 MG tablet Take 1 tablet (80 mg total) by mouth daily. 01/14/23   Dettinger, Elige Radon, MD  benazepril (LOTENSIN) 20 MG tablet Take 1 tablet (20 mg total) by mouth daily. 01/14/23   Dettinger, Elige Radon, MD  blood glucose meter kit and supplies KIT Dispense based on patient and insurance preference. Use four times daily as directed.  Diagnosis type 2 diabetes 05/10/19   Dettinger, Elige Radon, MD  Cholecalciferol (VITAMIN D) 2000 units CAPS Take 2,000 Units by mouth daily.    [provider]  feeding supplement, GLUCERNA SHAKE, (GLUCERNA SHAKE) LIQD Take 237 mLs by mouth 2 (two) times daily between meals. 12/13/15   Elenora Gamma, MD  fish oil-omega-3 fatty acids 1000 MG capsule Take 2 g by mouth every evening.    [provider]  glucose blood (ACCU-CHEK GUIDE) test strip USE 1 STRIP TO CHECK GLUCOSE UP TO 4 TIMES DAILY 06/19/22   Dettinger,  Elige Radon, MD  hydrALAZINE (APRESOLINE) 25 MG tablet Take 1 tablet (25 mg total) by mouth daily as needed. 01/14/23   Dettinger, Elige Radon, MD  hydrochlorothiazide (HYDRODIURIL) 25 MG tablet Take 1 tablet by mouth once daily 02/16/23   Dettinger, Elige Radon, MD  metFORMIN (GLUCOPHAGE-XR) 500 MG 24 hr tablet Take 1 tablet (500 mg total) by mouth daily with breakfast. 01/14/23   Dettinger, Elige Radon, MD  metoprolol tartrate (LOPRESSOR) 100 MG tablet Take 1 tablet (100 mg total) by mouth 2 (two) times daily. 01/14/23   Dettinger, Elige Radon, MD  nitroGLYCERIN (NITROSTAT) 0.4 MG SL tablet USE AS DIRECTED 06/08/22   Dettinger, Elige Radon, MD  pantoprazole (PROTONIX) 40 MG tablet Take 1 tablet (40 mg total) by mouth daily. 01/14/23   Dettinger, Elige Radon, MD  rivaroxaban (XARELTO) 20 MG TABS tablet Take 1 tablet (20 mg  total) by mouth daily with supper. 01/14/23   Dettinger, Elige Radon, MD    Inpatient Medications: Scheduled Meds:  Continuous Infusions:  PRN Meds:   Allergies:    Allergies  Allergen Reactions  . Alteplase Anaphylaxis  . Codeine Other (See Comments)    Bad dreams  . Lopid [Gemfibrozil] Nausea Only  . Voltaren [Diclofenac Sodium] Diarrhea    Social History:   Social History   Socioeconomic History  . Marital status: Married    Spouse name: Dwana Curd  . Number of children: 3  . Years of education: Not on file  . Highest education level: 7th grade  Occupational History  . Occupation: Retired    Comment: [employer]  . Smoking status: Former    Current packs/day: 0.00    Average packs/day: 0.1 packs/day for 45.0 years (5.4 ttl pk-yrs)    Types: Cigarettes    Start date: 11/25/1939    Quit date: 11/24/1984    Years since quitting: 38.5  . Smokeless tobacco: Former    Types: Snuff  . Tobacco comments:    "dipped snuff when I was a boy"  Vaping Use  . Vaping status: Never Used  Substance and Sexual Activity  . Alcohol use: No    Alcohol/week: 0.0 standard  drinks of alcohol  . Drug use: No  . Sexual activity: Not Currently  Other Topics Concern  . Not on file  Social History Narrative   One level living home with wife, Dwana Curd - she cooks every Sunday for the family of about 39 - children to great grandchildren   Social Determinants of Health   Financial Resource Strain: Low Risk  (01/07/2023)   Overall Financial Resource Strain (CARDIA)   . Difficulty of Paying Living Expenses: Not hard at all  Food Insecurity: No Food Insecurity (01/07/2023)   Hunger Vital Sign   . Worried About Programme researcher, broadcasting/film/video in the Last Year: Never true   . Ran Out of Food in the Last Year: Never true  Transportation Needs: No Transportation Needs (01/07/2023)   PRAPARE - Transportation   . Lack of Transportation (Medical): No   . Lack of Transportation (Non-Medical): No  Physical Activity: Insufficiently Active (01/07/2023)   Exercise Vital Sign   . Days of Exercise per Week: 3 days   . Minutes of Exercise per Session: 30 min  Stress: No Stress Concern Present (01/07/2023)   [employer] of Occupational Health - Occupational Stress Questionnaire   . Feeling of Stress : Not at all  Social Connections: Moderately Integrated (01/07/2023)   Social Connection and Isolation Panel [NHANES]   . Frequency of Communication with Friends and Family: More than three times a week   . Frequency of Social Gatherings with Friends and Family: More than three times a week   . Attends Religious Services: More than 4 times per year   . Active Member of Clubs or Organizations: No   . Attends Banker Meetings: Never   . Marital Status: Married  Catering manager Violence: Not At Risk (01/07/2023)   Humiliation, Afraid, Rape, and Kick questionnaire   . Fear of Current or Ex-Partner: No   . Emotionally Abused: No   . Physically Abused: No   . Sexually Abused: No    Family History:    Family History  Problem Relation Age of Onset  . Coronary artery disease Mother   .  Cancer Father        lung  .  Stroke Father   . Cancer Brother        lung  . Stroke Sister      ROS:  Please see the history of present illness.   All other ROS reviewed and negative.     Physical Exam/Data:   Vitals:   05/24/23 2130 05/24/23 2145 05/24/23 2200 05/24/23 2215  BP: 121/80 (!) 140/73 132/89 128/72  Pulse: 78  80   Resp: 14 12 18 17   Temp:      TempSrc:      SpO2: 96%  90%    No intake or output data in the 24 hours ending 05/24/23 2321    04/21/2023   10:47 AM 01/14/2023   10:07 AM 01/07/2023    9:49 AM  Last 3 Weights  Weight (lbs) 188 lb 3.2 oz 183 lb 196 lb  Weight (kg) 85.367 kg 83.008 kg 88.905 kg     There is no height or weight on file to calculate BMI.  General:  Well nourished, well developed, in no acute distress*** HEENT: normal Neck: no JVD Vascular: No carotid bruits; Distal pulses 2+ bilaterally Cardiac:  normal S1, S2; RRR; no murmur *** Lungs:  clear to auscultation bilaterally, no wheezing, rhonchi or rales  Abd: soft, nontender, no hepatomegaly  Ext: no edema Musculoskeletal:  No deformities, BUE and BLE strength normal and equal Skin: warm and dry  Neuro:  CNs 2-12 intact, no focal abnormalities noted Psych:  Normal affect   EKG:  The EKG was personally reviewed and demonstrates:  *** Telemetry:  Telemetry was personally reviewed and demonstrates:  ***  Relevant CV Studies: Echo 01/2022   1. Left ventricular ejection fraction, by estimation, is 55 to 60%. The  left ventricle has normal function. The left ventricle has no regional  wall motion abnormalities. There is mild left ventricular hypertrophy.  Left ventricular diastolic parameters  are consistent with Grade I diastolic dysfunction (impaired relaxation).  Possible small muscular VSD.   2. Right ventricular systolic function is normal. The right ventricular  size is mildly enlarged. There is mildly elevated pulmonary artery  systolic pressure. The estimated right  ventricular systolic pressure is  44.2 mmHg.   3. Left atrial size was moderately dilated.   4. Right atrial size was mildly dilated.   5. The mitral valve is normal in structure. Trivial mitral valve  regurgitation. No evidence of mitral stenosis.   6. The aortic valve is tricuspid. There is severe calcifcation of the  aortic valve. Aortic valve regurgitation is trivial. Suspect paradoxical  low flow/low gradient severe aortic valve stenosis. Aortic valve area, by  VTI measures 0.74 cm. Aortic valve  mean gradient measures 30.0 mmHg.   7. The pulmonic valve was abnormal. Pulmonic valve regurgitation is  moderate.   8. The inferior vena cava is normal in size with greater than 50%  respiratory variability, suggesting right atrial pressure of 3 mmHg.   Laboratory Data:  High Sensitivity Troponin:   Recent Labs  Lab 05/24/23 2126  TROPONINIHS 1,131*     Chemistry Recent Labs  Lab 05/24/23 2126  NA 130*  K 3.9  CL 93*  CO2 23  GLUCOSE 194*  BUN 19  CREATININE 1.32*  CALCIUM 8.9  MG 1.4*  GFRNONAA 51*  ANIONGAP 14    Recent Labs  Lab 05/24/23 2126  PROT 6.8  ALBUMIN 3.7  AST 34  ALT 21  ALKPHOS 73  BILITOT 1.3*   Lipids No results for input(s): "CHOL", "TRIG", "HDL", "  LABVLDL", "LDLCALC", "CHOLHDL" in the last 168 hours.  Hematology Recent Labs  Lab 05/24/23 2126  WBC 9.2  RBC 3.82*  HGB 13.0  HCT 36.8*  MCV 96.3  MCH 34.0  MCHC 35.3  RDW 12.7  PLT 172   Thyroid  Recent Labs  Lab 05/24/23 2126  TSH 2.561    BNPNo results for input(s): "BNP", "PROBNP" in the last 168 hours.  DDimer No results for input(s): "DDIMER" in the last 168 hours.   Radiology/Studies:  DG Chest Portable 1 View  Result Date: 05/24/2023 CLINICAL DATA:  Chest pain and burning, initial encounter EXAM: PORTABLE CHEST 1 VIEW COMPARISON:  07/09/2016 FINDINGS: Cardiac shadow is enlarged but stable. Aortic calcifications are noted. Loop recorder is seen. The lungs are well  aerated bilaterally without focal infiltrate or effusion. Mild central vascular congestion is noted. No bony abnormality is noted. IMPRESSION: Mild central vascular congestion. Electronically Signed   By: Alcide Clever M.D.   On: 05/24/2023 23:10   CT HEAD WO CONTRAST ( )  Result Date: 05/24/2023 CLINICAL DATA:  Altered mental status EXAM: CT HEAD WITHOUT CONTRAST TECHNIQUE: Contiguous axial images were obtained from the base of the skull through the vertex without intravenous contrast. RADIATION DOSE REDUCTION: This exam was performed according to the departmental dose-optimization program which includes automated exposure control, adjustment of the mA and/or kV according to patient size and/or use of iterative reconstruction technique. COMPARISON:  06/27/2019 FINDINGS: Brain: No evidence of acute infarction, hemorrhage, hydrocephalus, extra-axial collection or mass lesion/mass effect. Chronic atrophic and ischemic changes are noted. Vascular: No hyperdense vessel or unexpected calcification. Skull: Normal. Negative for fracture or focal lesion. Sinuses/Orbits: No acute finding. Other: None. IMPRESSION: Chronic atrophic and ischemic changes stable from the prior exam. Electronically Signed   By: Alcide Clever M.D.   On: 05/24/2023 23:10     Assessment and Plan:   ***   Risk Assessment/Risk Scores:  {Complete the following score calculators/questions to meet required metrics.  Press F2         :[id number]}   TIMI Risk Score for Unstable Angina or Non-ST Elevation MI:   The patient's TIMI risk score is  , which indicates a  % risk of all cause mortality, new or recurrent myocardial infarction or need for urgent revascularization in the next 14 days.{ Click here to calculate score  REFRESH Note before signing  :1} {Does this patient have CHF or CHF symptoms?      :[id number]}  CHA2DS2-VASc Score =    {Click here to calculate score.  REFRESH note before signing. :1} This indicates a  % annual risk of  stroke. The patient's score is based upon:           For questions or updates, please contact [facility] Please consult [email or web address] for contact info under    Signed, Roderic Palau, MD  05/24/2023 11:21 PM

## 2023-05-24 NOTE — ED Notes (Signed)
Called MRI to let me know when they will come to get him so I can give him ativan before he goes.

## 2023-05-24 NOTE — ED Provider Notes (Signed)
  Provider Note MRN:  [id number]  Arrival date & time: 05/25/23    ED Course and Medical Decision Making  Assumed care from Dr Tegeler at shift change.  See note from prior team for complete details, in brief:  89 year old male here from home by EMS concern for altered mental status (transient aphasia), chest burning Trop >1k Labs o/w stable Cards to see after MRI  Plan per prior physician f/u MRI  Hold [facility] until after MRI comes back  Likely admit medicine   Clinical Course as of 05/25/23 0208  Tue May 25, 2023  0113 Troponin I (High Sensitivity)(!!): 1,083 Delta down-trending  [SG]  0114 CTH reviewed, no large volume ICH, agree w/ radiologist  MRI reviewed, on wet read no large ICH or evidence of large ischemia. Pending formal read [SG]  0115 MRI interpretation reviewed, no acute CVA, chronic infarcts noted  [SG]  0117 Spoke with Dr Elayne Guerin > pt does not want cath, okay to continue DOAC and ASA, plan further diuresis and echo. Plan admission   [SG]    Clinical Course User Index [SG] Sloan Leiter, DO    Neurologic exam is unremarkable, possible TIA.  MRI negative.  Will consult neurology for TIA, admit to hospitalist for further evaluation  NO heparin per cardiology     .Critical Care  Performed by: Sloan Leiter, DO Authorized by: Sloan Leiter, DO   Critical care provider statement:    Critical care time (minutes):  40   Critical care time was exclusive of:  Separately billable procedures and treating other patients   Critical care was necessary to treat or prevent imminent or life-threatening deterioration of the following conditions:  Cardiac failure   Critical care was time spent personally by me on the following activities:  Development of treatment plan with patient or surrogate, discussions with consultants, evaluation of patient's response to treatment, examination of patient, ordering and review of laboratory studies, ordering and review of radiographic  studies, ordering and performing treatments and interventions, pulse oximetry, re-evaluation of patient's condition, review of old charts and obtaining history from patient or surrogate   Final Clinical Impressions(s) / ED Diagnoses     ICD-10-CM   1. Transient neurological symptoms  R29.818     2. Burning in the chest  R07.89     3. NSTEMI (non-ST elevated myocardial infarction) [place])  I21.4       ED Discharge Orders     None       Discharge Instructions   None        Sloan Leiter, DO 05/25/23 0208

## 2023-05-25 ENCOUNTER — Inpatient Hospital Stay (HOSPITAL_COMMUNITY): Payer: Medicare Other

## 2023-05-25 ENCOUNTER — Encounter (HOSPITAL_COMMUNITY): Payer: Self-pay | Admitting: Internal Medicine

## 2023-05-25 DIAGNOSIS — Z85828 Personal history of other malignant neoplasm of skin: Secondary | ICD-10-CM | POA: Diagnosis not present

## 2023-05-25 DIAGNOSIS — Z888 Allergy status to other drugs, medicaments and biological substances status: Secondary | ICD-10-CM | POA: Diagnosis not present

## 2023-05-25 DIAGNOSIS — E119 Type 2 diabetes mellitus without complications: Secondary | ICD-10-CM

## 2023-05-25 DIAGNOSIS — I6523 Occlusion and stenosis of bilateral carotid arteries: Secondary | ICD-10-CM | POA: Diagnosis not present

## 2023-05-25 DIAGNOSIS — G459 Transient cerebral ischemic attack, unspecified: Secondary | ICD-10-CM | POA: Diagnosis present

## 2023-05-25 DIAGNOSIS — I10 Essential (primary) hypertension: Secondary | ICD-10-CM | POA: Diagnosis not present

## 2023-05-25 DIAGNOSIS — I5033 Acute on chronic diastolic (congestive) heart failure: Secondary | ICD-10-CM

## 2023-05-25 DIAGNOSIS — E1122 Type 2 diabetes mellitus with diabetic chronic kidney disease: Secondary | ICD-10-CM | POA: Diagnosis present

## 2023-05-25 DIAGNOSIS — G9341 Metabolic encephalopathy: Secondary | ICD-10-CM | POA: Diagnosis present

## 2023-05-25 DIAGNOSIS — R0789 Other chest pain: Secondary | ICD-10-CM | POA: Diagnosis present

## 2023-05-25 DIAGNOSIS — J4540 Moderate persistent asthma, uncomplicated: Secondary | ICD-10-CM | POA: Insufficient documentation

## 2023-05-25 DIAGNOSIS — I130 Hypertensive heart and chronic kidney disease with heart failure and stage 1 through stage 4 chronic kidney disease, or unspecified chronic kidney disease: Secondary | ICD-10-CM | POA: Diagnosis present

## 2023-05-25 DIAGNOSIS — E782 Mixed hyperlipidemia: Secondary | ICD-10-CM

## 2023-05-25 DIAGNOSIS — Z789 Other specified health status: Secondary | ICD-10-CM | POA: Diagnosis not present

## 2023-05-25 DIAGNOSIS — E785 Hyperlipidemia, unspecified: Secondary | ICD-10-CM

## 2023-05-25 DIAGNOSIS — R079 Chest pain, unspecified: Secondary | ICD-10-CM | POA: Diagnosis not present

## 2023-05-25 DIAGNOSIS — I25110 Atherosclerotic heart disease of native coronary artery with unstable angina pectoris: Secondary | ICD-10-CM

## 2023-05-25 DIAGNOSIS — R569 Unspecified convulsions: Secondary | ICD-10-CM | POA: Diagnosis not present

## 2023-05-25 DIAGNOSIS — I771 Stricture of artery: Secondary | ICD-10-CM | POA: Diagnosis not present

## 2023-05-25 DIAGNOSIS — I6501 Occlusion and stenosis of right vertebral artery: Secondary | ICD-10-CM | POA: Diagnosis not present

## 2023-05-25 DIAGNOSIS — Z7189 Other specified counseling: Secondary | ICD-10-CM | POA: Diagnosis not present

## 2023-05-25 DIAGNOSIS — I5032 Chronic diastolic (congestive) heart failure: Secondary | ICD-10-CM | POA: Diagnosis present

## 2023-05-25 DIAGNOSIS — E1169 Type 2 diabetes mellitus with other specified complication: Secondary | ICD-10-CM | POA: Diagnosis present

## 2023-05-25 DIAGNOSIS — N1831 Chronic kidney disease, stage 3a: Secondary | ICD-10-CM | POA: Diagnosis present

## 2023-05-25 DIAGNOSIS — I480 Paroxysmal atrial fibrillation: Secondary | ICD-10-CM

## 2023-05-25 DIAGNOSIS — I2510 Atherosclerotic heart disease of native coronary artery without angina pectoris: Secondary | ICD-10-CM | POA: Diagnosis present

## 2023-05-25 DIAGNOSIS — R4701 Aphasia: Secondary | ICD-10-CM | POA: Diagnosis present

## 2023-05-25 DIAGNOSIS — I708 Atherosclerosis of other arteries: Secondary | ICD-10-CM | POA: Diagnosis present

## 2023-05-25 DIAGNOSIS — I350 Nonrheumatic aortic (valve) stenosis: Secondary | ICD-10-CM

## 2023-05-25 DIAGNOSIS — Z515 Encounter for palliative care: Secondary | ICD-10-CM | POA: Diagnosis not present

## 2023-05-25 DIAGNOSIS — R29818 Other symptoms and signs involving the nervous system: Secondary | ICD-10-CM | POA: Diagnosis not present

## 2023-05-25 DIAGNOSIS — I214 Non-ST elevation (NSTEMI) myocardial infarction: Secondary | ICD-10-CM | POA: Diagnosis present

## 2023-05-25 DIAGNOSIS — E871 Hypo-osmolality and hyponatremia: Secondary | ICD-10-CM

## 2023-05-25 DIAGNOSIS — Z885 Allergy status to narcotic agent status: Secondary | ICD-10-CM | POA: Diagnosis not present

## 2023-05-25 DIAGNOSIS — I358 Other nonrheumatic aortic valve disorders: Secondary | ICD-10-CM | POA: Diagnosis present

## 2023-05-25 DIAGNOSIS — Z79899 Other long term (current) drug therapy: Secondary | ICD-10-CM | POA: Diagnosis not present

## 2023-05-25 DIAGNOSIS — R1310 Dysphagia, unspecified: Secondary | ICD-10-CM | POA: Diagnosis present

## 2023-05-25 LAB — CBC WITH DIFFERENTIAL/PLATELET
Abs Immature Granulocytes: 0.04 10*3/uL (ref 0.00–0.07)
Basophils Absolute: 0 10*3/uL (ref 0.0–0.1)
Basophils Relative: 0 %
Eosinophils Absolute: 0.1 10*3/uL (ref 0.0–0.5)
Eosinophils Relative: 1 %
HCT: 34 % — ABNORMAL LOW (ref 39.0–52.0)
Hemoglobin: 11.9 g/dL — ABNORMAL LOW (ref 13.0–17.0)
Immature Granulocytes: 1 %
Lymphocytes Relative: 21 %
Lymphs Abs: 1.7 10*3/uL (ref 0.7–4.0)
MCH: 34 pg (ref 26.0–34.0)
MCHC: 35 g/dL (ref 30.0–36.0)
MCV: 97.1 fL (ref 80.0–100.0)
Monocytes Absolute: 1 10*3/uL (ref 0.1–1.0)
Monocytes Relative: 12 %
Neutro Abs: 5.2 10*3/uL (ref 1.7–7.7)
Neutrophils Relative %: 65 %
Platelets: 152 10*3/uL (ref 150–400)
RBC: 3.5 MIL/uL — ABNORMAL LOW (ref 4.22–5.81)
RDW: 12.7 % (ref 11.5–15.5)
WBC: 8 10*3/uL (ref 4.0–10.5)
nRBC: 0 % (ref 0.0–0.2)

## 2023-05-25 LAB — LIPID PANEL
Cholesterol: 88 mg/dL (ref 0–200)
HDL: 40 mg/dL — ABNORMAL LOW (ref >40)
LDL Cholesterol: 32 mg/dL (ref 0–99)
Total CHOL/HDL Ratio: 2.2 {ratio}
Triglycerides: 79 mg/dL (ref <150)
VLDL: 16 mg/dL (ref 0–40)

## 2023-05-25 LAB — COMPREHENSIVE METABOLIC PANEL
ALT: 18 U/L (ref 0–44)
AST: 28 U/L (ref 15–41)
Albumin: 3.4 g/dL — ABNORMAL LOW (ref 3.5–5.0)
Alkaline Phosphatase: 64 U/L (ref 38–126)
Anion gap: 13 (ref 5–15)
BUN: 19 mg/dL (ref 8–23)
CO2: 23 mmol/L (ref 22–32)
Calcium: 8.7 mg/dL — ABNORMAL LOW (ref 8.9–10.3)
Chloride: 93 mmol/L — ABNORMAL LOW (ref 98–111)
Creatinine, Ser: 1.42 mg/dL — ABNORMAL HIGH (ref 0.61–1.24)
GFR, Estimated: 47 mL/min — ABNORMAL LOW (ref >60)
Glucose, Bld: 158 mg/dL — ABNORMAL HIGH (ref 70–99)
Potassium: 3.6 mmol/L (ref 3.5–5.1)
Sodium: 129 mmol/L — ABNORMAL LOW (ref 135–145)
Total Bilirubin: 1.4 mg/dL — ABNORMAL HIGH (ref 0.3–1.2)
Total Protein: 6 g/dL — ABNORMAL LOW (ref 6.5–8.1)

## 2023-05-25 LAB — CBG MONITORING, ED
Glucose-Capillary: 154 mg/dL — ABNORMAL HIGH (ref 70–99)
Glucose-Capillary: 145 mg/dL — ABNORMAL HIGH (ref 70–99)

## 2023-05-25 LAB — BRAIN NATRIURETIC PEPTIDE: B Natriuretic Peptide: 703.3 pg/mL — ABNORMAL HIGH (ref 0.0–100.0)

## 2023-05-25 LAB — GLUCOSE, CAPILLARY
Glucose-Capillary: 158 mg/dL — ABNORMAL HIGH (ref 70–99)
Glucose-Capillary: 166 mg/dL — ABNORMAL HIGH (ref 70–99)

## 2023-05-25 LAB — MAGNESIUM: Magnesium: 1.5 mg/dL — ABNORMAL LOW (ref 1.7–2.4)

## 2023-05-25 LAB — MRSA NEXT GEN BY PCR, NASAL: MRSA by PCR Next Gen: NOT DETECTED

## 2023-05-25 LAB — TROPONIN I (HIGH SENSITIVITY): Troponin I (High Sensitivity): 1083 ng/L (ref <18)

## 2023-05-25 MED ORDER — RIVAROXABAN 10 MG PO TABS: 20.0000 mg | ORAL_TABLET | Freq: Every day | ORAL | Status: DC

## 2023-05-25 MED ORDER — FUROSEMIDE 10 MG/ML IJ SOLN
40.0000 mg | Freq: Once | INTRAMUSCULAR | Status: AC
  Administered 2023-05-25: 40 mg via INTRAVENOUS
  Filled 2023-05-25: qty 4

## 2023-05-25 MED ORDER — MAGNESIUM SULFATE 2 GM/50ML IV SOLN
2.0000 g | Freq: Once | INTRAVENOUS | Status: AC
  Administered 2023-05-25: 2 g via INTRAVENOUS
  Filled 2023-05-25: qty 50

## 2023-05-25 MED ORDER — MOMETASONE FURO-FORMOTEROL FUM 100-5 MCG/ACT IN AERO
2.0000 | INHALATION_SPRAY | Freq: Two times a day (BID) | RESPIRATORY_TRACT | Status: DC
  Administered 2023-05-25 – 2023-05-28 (×6): 2 via RESPIRATORY_TRACT
  Filled 2023-05-25 (×2): qty 8.8

## 2023-05-25 MED ORDER — METOPROLOL TARTRATE 50 MG PO TABS
100.0000 mg | ORAL_TABLET | Freq: Two times a day (BID) | ORAL | Status: DC
  Administered 2023-05-25 – 2023-05-26 (×4): 100 mg via ORAL
  Filled 2023-05-25: qty 2
  Filled 2023-05-25: qty 4
  Filled 2023-05-25 (×2): qty 2

## 2023-05-25 MED ORDER — HALOPERIDOL 1 MG PO TABS
1.0000 mg | ORAL_TABLET | Freq: Four times a day (QID) | ORAL | Status: DC | PRN
  Administered 2023-05-26: 1 mg via ORAL
  Filled 2023-05-25 (×2): qty 1

## 2023-05-25 MED ORDER — ALBUTEROL SULFATE (2.5 MG/3ML) 0.083% IN NEBU: 2.5000 mg | INHALATION_SOLUTION | RESPIRATORY_TRACT | Status: DC | PRN

## 2023-05-25 MED ORDER — INSULIN ASPART 100 UNIT/ML IJ SOLN
0.0000 [IU] | Freq: Three times a day (TID) | INTRAMUSCULAR | Status: DC
  Administered 2023-05-25 – 2023-05-28 (×9): 1 [IU] via SUBCUTANEOUS

## 2023-05-25 MED ORDER — IOHEXOL 350 MG/ML SOLN
60.0000 mL | Freq: Once | INTRAVENOUS | Status: AC | PRN
  Administered 2023-05-25: 60 mL via INTRAVENOUS

## 2023-05-25 MED ORDER — ACETAMINOPHEN 650 MG RE SUPP: 650.0000 mg | Freq: Four times a day (QID) | RECTAL | Status: DC | PRN

## 2023-05-25 MED ORDER — ATORVASTATIN CALCIUM 80 MG PO TABS
80.0000 mg | ORAL_TABLET | Freq: Every day | ORAL | Status: DC
  Administered 2023-05-25 – 2023-05-28 (×4): 80 mg via ORAL
  Filled 2023-05-25 (×4): qty 1

## 2023-05-25 MED ORDER — ASPIRIN 325 MG PO TABS
325.0000 mg | ORAL_TABLET | Freq: Once | ORAL | Status: AC
  Administered 2023-05-25: 325 mg via ORAL
  Filled 2023-05-25: qty 1

## 2023-05-25 MED ORDER — PANTOPRAZOLE SODIUM 40 MG PO TBEC
40.0000 mg | DELAYED_RELEASE_TABLET | Freq: Every day | ORAL | Status: DC
  Administered 2023-05-25 – 2023-05-28 (×4): 40 mg via ORAL
  Filled 2023-05-25 (×4): qty 1

## 2023-05-25 MED ORDER — MELATONIN 3 MG PO TABS
3.0000 mg | ORAL_TABLET | Freq: Every evening | ORAL | Status: DC | PRN
  Administered 2023-05-26 – 2023-05-27 (×2): 3 mg via ORAL
  Filled 2023-05-25 (×2): qty 1

## 2023-05-25 MED ORDER — ONDANSETRON HCL 4 MG/2ML IJ SOLN: 4.0000 mg | Freq: Four times a day (QID) | INTRAMUSCULAR | Status: DC | PRN

## 2023-05-25 MED ORDER — ACETAMINOPHEN 325 MG PO TABS
650.0000 mg | ORAL_TABLET | Freq: Four times a day (QID) | ORAL | Status: DC | PRN
  Administered 2023-05-25 – 2023-05-28 (×2): 650 mg via ORAL
  Filled 2023-05-25 (×2): qty 2

## 2023-05-25 MED ORDER — STROKE: EARLY STAGES OF RECOVERY BOOK
Freq: Once | Status: AC
  Filled 2023-05-25: qty 1

## 2023-05-25 MED ORDER — BENAZEPRIL HCL 20 MG PO TABS
20.0000 mg | ORAL_TABLET | Freq: Every day | ORAL | Status: DC
  Administered 2023-05-25 – 2023-05-28 (×4): 20 mg via ORAL
  Filled 2023-05-25 (×4): qty 1

## 2023-05-25 MED ORDER — HEPARIN (PORCINE) 25000 UT/250ML-% IV SOLN
1500.0000 [IU]/h | INTRAVENOUS | Status: DC
  Administered 2023-05-25: 1200 [IU]/h via INTRAVENOUS
  Administered 2023-05-26: 1500 [IU]/h via INTRAVENOUS
  Filled 2023-05-25 (×2): qty 250

## 2023-05-25 MED ORDER — POTASSIUM CHLORIDE CRYS ER 20 MEQ PO TBCR
40.0000 meq | EXTENDED_RELEASE_TABLET | Freq: Once | ORAL | Status: AC
  Administered 2023-05-25: 40 meq via ORAL
  Filled 2023-05-25: qty 2

## 2023-05-25 MED ORDER — HALOPERIDOL LACTATE 5 MG/ML IJ SOLN
1.0000 mg | Freq: Four times a day (QID) | INTRAMUSCULAR | Status: DC | PRN
  Administered 2023-05-25: 1 mg via INTRAMUSCULAR
  Filled 2023-05-25: qty 1

## 2023-05-25 NOTE — Progress Notes (Addendum)
STROKE TEAM PROGRESS NOTE   BRIEF HPI [name] is a 89 y.o. male with history of HTN, HLD, CAD, DM2, 3x previous strokes or TIAs (2x in 2015, 1x in 2021) pafibb on xarelto  presenting with a 20 minute episode of word-finding difficulty with complete resolution.  He also reported reflux like symptoms over the last week and found to have elevated troponin and is being admitted for further evaluation and management of NSTEMI.   Head and neck CT angiogram from 10/21 showed high-grade stenosis in the right subclavian artery, tortuous but patent left subclavian artery, moderate stenosis in the right internal carotid artery, plaque without significant stenosis in the left internal carotid artery.   SIGNIFICANT HOSPITAL EVENTS 10/21 - ED for stroke-like symptoms, elevated troponin found 10/22 - Admitted to floor bed, EEG ordered.  INTERIM HISTORY/SUBJECTIVE Pt was met in the ED with Dr Pearlean Brownie, Neuro NP [name]. The patient's daughter in law was present for the exam and assisted with the history. The history was as above, with the patient showing no signs of word-finding difficulty on exam. Pt mentioned that he had had 7 episodes of word-finding difficulty that generally resolved without treatment in his life. Family did not know whether this was indeed the case, but he has had several strokes/TIAs in the past (2003, 2013, 2016, 2017, 2021). The team ordered an EEG to evaluate for possible seizure events.   OBJECTIVE  CBC    Component Value Date/Time   WBC 8.0 05/25/2023 0316   RBC 3.50 (L) 05/25/2023 0316   HGB 11.9 (L) 05/25/2023 0316   HGB 13.1 04/21/2023 1046   HCT 34.0 (L) 05/25/2023 0316   HCT 38.5 04/21/2023 1046   PLT 152 05/25/2023 0316   PLT 194 04/21/2023 1046   MCV 97.1 05/25/2023 0316   MCV 101 (H) 04/21/2023 1046   MCH 34.0 05/25/2023 0316   MCHC 35.0 05/25/2023 0316   RDW 12.7 05/25/2023 0316   RDW 12.0 04/21/2023 1046   LYMPHSABS 1.7 05/25/2023 0316   LYMPHSABS  1.8 04/21/2023 1046   MONOABS 1.0 05/25/2023 0316   EOSABS 0.1 05/25/2023 0316   EOSABS 0.3 04/21/2023 1046   BASOSABS 0.0 05/25/2023 0316   BASOSABS 0.0 04/21/2023 1046    BMET    Component Value Date/Time   NA 129 (L) 05/25/2023 0316   NA 134 04/21/2023 1046   K 3.6 05/25/2023 0316   CL 93 (L) 05/25/2023 0316   CO2 23 05/25/2023 0316   GLUCOSE 158 (H) 05/25/2023 0316   BUN 19 05/25/2023 0316   BUN 13 04/21/2023 1046   CREATININE 1.42 (H) 05/25/2023 0316   CREATININE 1.27 02/02/2013 0853   CALCIUM 8.7 (L) 05/25/2023 0316   EGFR 53 (L) 04/21/2023 1046   GFRNONAA 47 (L) 05/25/2023 0316   GFRNONAA 53 (L) 02/02/2013 0853    IMAGING past 24 hours VAS US CAROTID  Result Date: 05/25/2023 Carotid Arterial Duplex Study Patient Name:  [name]  Date of Exam:   05/25/2023 Medical Rec #: [id number]       Accession #:    [id number] Date of Birth: 1929/10/06       Patient Gender: M Patient Age:   48 years Exam Location:  [facility] Procedure:      VAS US CAROTID Referring Phys: Terrilee Files [facility] --------------------------------------------------------------------------------  Indications:       CVA. Risk Factors:      Hypertension, hyperlipidemia, Diabetes. Comparison Study:  05/25/2023 - CT ANGIO  HEAD NECK W WO CM                     1. Positive for abnormal Right Vertebral Artery, new since a                    2021                    CTA:                    Differential considerations include atherosclerotic Occlusion                    of the                    Right V1 segment (severe calcified atherosclerotic stenosis                    at the                    right vertebral origin) versus slow retrograde flow within                    the                    vessel due to Right Subclavian Steal (see #2). Doppler                    Ultrasound                    evaluation of the right vertebral (Carotid Doppler exam) may                    be the                    simplest way  to differentiate.                     2. Very Severe stenosis of the Right Subclavian Artery origin                    due to                    combined bulky calcified plaque and tortuosity, progressed                    since                    2021. The right subclavian remains patent.                     3. No other large vessel occlusion. No hemodynamically                    significant                    left vertebral artery or basilar stenosis.                    Extensive, bulky calcified atherosclerosis of both proximal                    ICAs and                    both ICA siphons:                    -  60% stenosis RICA bulb is stable but Moderate right ICA                    siphon                    stenosis appears progressed since 2021.                    - 50% stenosis proximal LICA is stable since 2021. Mild left                    ICA                    siphon stenosis appears progressed.                     4. Intermittent circle-of-Willis branch mild to moderate                    atherosclerosis: Right MCA M1, left PCA P2/P3, bilateral MCA                    branches.                     5. Aortic Atherosclerosis (ICD10-I70.0). Performing Technologist: Chanda Busing RVT  Examination Guidelines: A complete evaluation includes B-mode imaging, spectral Doppler, color Doppler, and power Doppler as needed of all accessible portions of each vessel. Bilateral testing is considered an integral part of a complete examination. Limited examinations for reoccurring indications may be performed as noted.  Right Carotid Findings: +----------+--------+--------+--------+-----------------------+--------+           PSV cm/sEDV cm/sStenosisPlaque Description     Comments +----------+--------+--------+--------+-----------------------+--------+ CCA Prox  46      8               smooth and heterogenoustortuous +----------+--------+--------+--------+-----------------------+--------+ CCA Distal47       8               smooth and heterogenous         +----------+--------+--------+--------+-----------------------+--------+ ICA Prox  107     18              smooth and heterogenous         +----------+--------+--------+--------+-----------------------+--------+ ICA Mid   90      12                                              +----------+--------+--------+--------+-----------------------+--------+ ICA Distal86      21                                     tortuous +----------+--------+--------+--------+-----------------------+--------+ ECA       99      9                                               +----------+--------+--------+--------+-----------------------+--------+ +----------+--------+-------+--------+-------------------+           PSV cm/sEDV cmsDescribeArm Pressure (mmHG) +----------+--------+-------+--------+-------------------+ Subclavian               Stenotic                    +----------+--------+-------+--------+-------------------+ +---------+--------+--------+---------------+  VertebralPSV cm/sEDV cm/sBi- directional +---------+--------+--------+---------------+  Left Carotid Findings: +----------+--------+--------+--------+--------------------------+--------+           PSV cm/sEDV cm/sStenosisPlaque Description        Comments +----------+--------+--------+--------+--------------------------+--------+ CCA Prox  72      12              irregular and heterogenous         +----------+--------+--------+--------+--------------------------+--------+ CCA Distal70      9               smooth and heterogenous            +----------+--------+--------+--------+--------------------------+--------+ ICA Prox  62      13              smooth and heterogenous            +----------+--------+--------+--------+--------------------------+--------+ ICA Mid   65      16                                                  +----------+--------+--------+--------+--------------------------+--------+ ICA Distal66      13                                        tortuous +----------+--------+--------+--------+--------------------------+--------+ ECA       64      9                                                  +----------+--------+--------+--------+--------------------------+--------+ +----------+--------+--------+--------+-------------------+           PSV cm/sEDV cm/sDescribeArm Pressure (mmHG) +----------+--------+--------+--------+-------------------+ [id number]                                         +----------+--------+--------+--------+-------------------+ +---------+--------+--+--------+-+---------+ VertebralPSV cm/s54EDV cm/s8Antegrade +---------+--------+--+--------+-+---------+   Summary: Right Carotid: Velocities in the right ICA are consistent with a 1-39% stenosis.                Abnormal, stenotic flow noted in the subclavian artery. Left Carotid: Velocities in the left ICA are consistent with a 1-39% stenosis. Vertebrals: Left vertebral artery demonstrates antegrade flow. Right vertebral             artery demonstrates bidirectional flow. *See table(s) above for measurements and observations.     Preliminary    CT ANGIO HEAD NECK W WO CM  Addendum Date: 05/25/2023   ADDENDUM REPORT: 05/25/2023 05:00 ADDENDUM: Study discussed by telephone with Dr. Erick Blinks on 05/25/2023 at 0449 hours. Electronically Signed   By: Odessa Fleming M.D.   On: 05/25/2023 05:00   Result Date: 05/25/2023 CLINICAL DATA:  89 year old male TIA, altered mental status. EXAM: CT ANGIOGRAPHY HEAD AND NECK WITH CONTRAST TECHNIQUE: Multidetector CT imaging of the head and neck was performed using the standard protocol during bolus administration of intravenous contrast. Multiplanar CT image reconstructions and MIPs were obtained to evaluate the vascular anatomy. Carotid stenosis measurements (when applicable)  are obtained utilizing NASCET criteria, using the distal internal carotid diameter as the denominator. RADIATION DOSE REDUCTION: This exam was  performed according to the departmental dose-optimization program which includes automated exposure control, adjustment of the mA and/or kV according to patient size and/or use of iterative reconstruction technique. CONTRAST:  60mL OMNIPAQUE IOHEXOL 350 MG/ML SOLN COMPARISON:  Head CT and brain MRI yesterday. Previous CTA head and neck 02/18/2020. FINDINGS: CTA NECK Skeleton: Cervical spine degeneration, including advanced at C1-C2 with partially calcified ligamentous hypertrophy there. No acute osseous abnormality identified. Upper chest: Stable upper lungs, mild respiratory motion. No superior mediastinal lymphadenopathy. Other neck: No acute finding. Aortic arch: Calcified aortic atherosclerosis. 3 vessel arch. Brachiocephalic origin not completely included today. Right carotid system: Brachiocephalic artery plaque without significant stenosis. Tortuous right CCA origin with only mild plaque and no significant stenosis. Heavily calcified right ICA origin and bulb. At least 60 % stenosis with respect to the distal vessel at the bulb on series 7, image 110 does not appear significantly changed from 2021. The right ICA remains patent to the skull base with mild additional calcified plaque at C1-C2. Left carotid system: Left CCA origin calcified plaque without stenosis. Tortuous proximal left CCA. Confluent calcified plaque at the left ICA origin and bulb with up to 50% stenosis, stable since 2021. Left ICA remains patent with mild additional calcified plaque below the skull base. Vertebral arteries: Heavily calcified and tortuous right subclavian artery origin with severe stenosis superimposed on a kinked appearance of the vessel series 11, image 85. This is chronic, but progressed compared to 2021. Right vertebral artery origin is heavily calcified on series 10, image 85,  severely stenotic. That appearance is similar to the 2021 CTA, but the right vertebral artery is newly occluded in the lower neck, V1 segment and there is no reconstitution until the skull base. Proximal left subclavian artery calcified plaque and tortuosity does not appear hemodynamically significant. Left vertebral artery origin is patent and tortuous without stenosis (series 10, image 91). Left vertebral artery caliber is similar to the 2021 CTA, the vessel remains patent to the skull base without stenosis. CTA HEAD Posterior circulation: Mild distal left vertebral artery calcified plaque and stenosis to the vertebrobasilar junction. Patent left PICA. Reconstituted right V4 segment, probably in a retrograde fashion from the vertebrobasilar junction. Right PICA also faintly enhancing. Patent basilar artery with mild irregularity, no significant stenosis. Fetal type right PCA origin again noted. Patent SCA origins. Bilateral PCAs are patent with only mild irregularity and stenosis at the left P2/P3 segment which is chronic and stable or improved. Anterior circulation: Both ICA siphons are patent. Heavily calcified left siphon with some underlying ectasia. Mild left siphon stenosis does appear progressed since 2021. Similar contralateral right siphon heavy calcification. Mild to moderate distal cavernous and proximal supraclinoid right siphon stenosis does appear increased since 2021. Right posterior communicating artery origin remains normal. Carotid termini and MCA and ACA origins remain patent. Tortuous A1 segments. Normal anterior communicating artery. Tortuous bilateral ACA branches with some calcified plaque, but stable with only mild stenosis. Left MCA M1 segment and tortuous bifurcation are patent without stenosis. Right MCA M1 segment is patent with mild chronic irregularity and stenosis in the proximal M1 near the origin (series 13, image 31, stable. Right MCA trifurcation is patent without stenosis. No  MCA branch occlusion is identified. Mild to moderate posterior division MCA M3 and M4 branch irregularity bilaterally with stenosis does not appear significantly changed since 2021 (series 14, image 28 on the left. Venous sinuses: Early contrast timing, not evaluated. Anatomic variants: Fetal type right PCA origin. Review of the  MIP images confirms the above findings IMPRESSION: 1. Positive for abnormal Right Vertebral Artery, new since a 2021 CTA: Differential considerations include atherosclerotic Occlusion of the Right V1 segment (severe calcified atherosclerotic stenosis at the right vertebral origin) versus slow retrograde flow within the vessel due to Right Subclavian Steal (see #2). Doppler Ultrasound evaluation of the right vertebral (Carotid Doppler exam) may be the simplest way to differentiate. 2. Very Severe stenosis of the Right Subclavian Artery origin due to combined bulky calcified plaque and tortuosity, progressed since 2021. The right subclavian remains patent. 3. No other large vessel occlusion. No hemodynamically significant left vertebral artery or basilar stenosis. Extensive, bulky calcified atherosclerosis of both proximal ICAs and both ICA siphons: - 60% stenosis RICA bulb is stable but Moderate right ICA siphon stenosis appears progressed since 2021. - 50% stenosis proximal LICA is stable since 2021. Mild left ICA siphon stenosis appears progressed. 4. Intermittent circle-of-Willis branch mild to moderate atherosclerosis: Right MCA M1, left PCA P2/P3, bilateral MCA branches. 5.  Aortic Atherosclerosis (ICD10-I70.0). Electronically Signed: By: Odessa Fleming M.D. On: 05/25/2023 04:31   MR BRAIN WO CONTRAST  Result Date: 05/25/2023 CLINICAL DATA:  Transient aphasia and altered mental status EXAM: MRI HEAD WITHOUT CONTRAST TECHNIQUE: Multiplanar, multiecho pulse sequences of the brain and surrounding structures were obtained without intravenous contrast. COMPARISON:  02/18/2020 FINDINGS: Brain:  No acute infarct, mass effect or extra-axial collection. No acute or chronic hemorrhage. There is multifocal hyperintense T2-weighted signal within the white matter. Generalized volume loss. There are old bilateral cerebellar infarcts. The midline structures are normal. Vascular: Normal flow voids. Skull and upper cervical spine: Normal calvarium and skull base. Visualized upper cervical spine and soft tissues are normal. Sinuses/Orbits:No paranasal sinus fluid levels or advanced mucosal thickening. No mastoid or middle ear effusion. Normal orbits. Other: None. IMPRESSION: 1. No acute intracranial abnormality. 2. Old bilateral cerebellar infarcts and findings of chronic small vessel disease. Electronically Signed   By: Deatra Robinson M.D.   On: 05/25/2023 01:12   DG Chest Portable 1 View  Result Date: 05/24/2023 CLINICAL DATA:  Chest pain and burning, initial encounter EXAM: PORTABLE CHEST 1 VIEW COMPARISON:  07/09/2016 FINDINGS: Cardiac shadow is enlarged but stable. Aortic calcifications are noted. Loop recorder is seen. The lungs are well aerated bilaterally without focal infiltrate or effusion. Mild central vascular congestion is noted. No bony abnormality is noted. IMPRESSION: Mild central vascular congestion. Electronically Signed   By: Alcide Clever M.D.   On: 05/24/2023 23:10   CT HEAD WO CONTRAST ( )  Result Date: 05/24/2023 CLINICAL DATA:  Altered mental status EXAM: CT HEAD WITHOUT CONTRAST TECHNIQUE: Contiguous axial images were obtained from the base of the skull through the vertex without intravenous contrast. RADIATION DOSE REDUCTION: This exam was performed according to the departmental dose-optimization program which includes automated exposure control, adjustment of the mA and/or kV according to patient size and/or use of iterative reconstruction technique. COMPARISON:  06/27/2019 FINDINGS: Brain: No evidence of acute infarction, hemorrhage, hydrocephalus, extra-axial collection or mass  lesion/mass effect. Chronic atrophic and ischemic changes are noted. Vascular: No hyperdense vessel or unexpected calcification. Skull: Normal. Negative for fracture or focal lesion. Sinuses/Orbits: No acute finding. Other: None. IMPRESSION: Chronic atrophic and ischemic changes stable from the prior exam. Electronically Signed   By: Alcide Clever M.D.   On: 05/24/2023 23:10    Vitals:   05/25/23 1056 05/25/23 1414 05/25/23 1420 05/25/23 1517  BP: (!) 138/111 (!) 97/54  (!) 132/98  Pulse: 86  65    Resp: 20 18    Temp:  98 F (36.7 C)  97.6 F (36.4 C)  TempSrc:    Axillary  SpO2: 94% 98%    Weight:   90.7 kg   Height:   5\' 11"  (1.803 m) 5\' 11"  (1.803 m)   PHYSICAL EXAM General:  Alert, well-nourished, well-developed patient in no acute distress. Pt is elderly with numerous patchy growths (pt describes as cancers, his hx is consistent) Psych:  Mood and affect appropriate for situation CV: Regular rate and rhythm on monitor Respiratory:  Regular, unlabored respirations on room air GI: Abdomen soft and nontender  NEURO:  Mental Status: AA&Ox3, patient is able to give reasonably clear history. Speech/Language: speech is without dysarthria or aphasia.  Naming, repetition, fluency, and comprehension intact.  Cranial Nerves:  II: PERRL. Visual fields full.  III, IV, VI: EOMI. Eyelids elevate symmetrically.  V: Sensation is intact to light touch and symmetrical to face.  VII: Face is symmetrical resting and smiling VIII: hearing intact to voice. IX, X: Palate elevates symmetrically. Phonation is normal.  RJ:JOACZYSA shrug 5/5. XII: tongue is midline without fasciculations. Motor: 5/5 strength to all muscle groups tested.  Tone: is normal and bulk is normal Sensation- Intact to light touch bilaterally. Extinction absent to light touch to DSS.   Coordination: FTN intact bilaterally, HKS: no ataxia in BLE.No drift.  Gait- deferred  Normal Neuro exam.  ASSESSMENT/PLAN  Transient  Ischemic Attack vs Seizure Etiology:  TBD  Code Stroke 10/21 IMPRESSION: Chronic atrophic and ischemic changes stable from the prior exam.   CTA head & neck 10/22 1. Positive for abnormal Right Vertebral Artery, new since a 2021 CTA Differential considerations include atherosclerotic Occlusion of the Right V1 segment (severe calcified atherosclerotic stenosis at the right vertebral origin) versus slow retrograde flow within the vessel due to Right Subclavian Steal (see #2). Doppler Ultrasound evaluation of the right vertebral (Carotid Doppler exam) may be the simplest way to differentiate.  2. Very Severe stenosis of the Right Subclavian Artery origin due to combined bulky calcified plaque and tortuosity, progressed since 2021. The right subclavian remains patent. 3. No other large vessel occlusion. No hemodynamically significant left vertebral artery or basilar stenosis. Extensive, bulky calcified atherosclerosis of both proximal ICAs and both ICA siphons: - 60% stenosis RICA bulb is stable but Moderate right ICA siphon stenosis appears progressed since 2021. - 50% stenosis proximal LICA is stable since 2021. Mild left ICA siphon stenosis appears progressed. 4. Intermittent circle-of-Willis branch mild to moderate atherosclerosis: Right MCA M1, left PCA P2/P3, bilateral MCA branches. 5.  Aortic Atherosclerosis (ICD10-I70.0) MRI  10/22 IMPRESSION: 1. No acute intracranial abnormality. 2. Old bilateral cerebellar infarcts and findings of chronic small vessel disease. LDL 32 HgbA1c 6.4 VTE prophylaxis - home (xarelto) Heparin per pharmacy Xarelto (rivaroxaban) daily prior to admission, now on heparin IV for TBD weeks and then xarelto alone. Therapy recommendations:  Pending Disposition:  TBD  Hx of Stroke/TIA 2003, 2013, 2016, 2017, 2021  Diabetes Mellitus Type II Home Meds: Metformin 500 BID HgbA1c 6.4  (goal <7) CBGs SSI  Atrial fibrillation Home Meds: Xarelto Continue telemetry  monitoring Begin anticoagulation with Heparin while inpatient    Hypertension Home meds:  metoprolol, hydrochlorothiazide, benazepril, hydralazine Stable Blood Pressure Goal: SBP less than 160   Hyperlipidemia Home meds:  atorvastatin, 80 resumed in hospital LDL 32, goal < 70 Continue statin at discharge  Tobacco Abuse 40 year pack history - 1941 - 1986  Dysphagia Patient has no post-stroke dysphagia, SLP consulted    Diet   Diet regular Room service appropriate? Yes; Fluid consistency: Thin   Other Stroke Risk Factors Family hx stroke (Father, Sister) Coronary artery disease  Other Active Problems Cardiac work-up not complete,   Hospital day # 0  Signed: Christie Nottingham, MD [place] Physician 05/25/2023 4:45 PM  STROKE MD NOTE :  I have personally obtained history,examined this patient, reviewed notes, independently viewed imaging studies, participated in medical decision making and plan of care.ROS completed by me personally and pertinent positives fully documented  I have made any additions or clarifications directly to the above note. Agree with note above.  He presented with transient 20-minute episode of expressive speech and language difficulties.  He states he has had 6 or 7 such previous episodes.  MRI is negative for acute stroke.  Recommend check EEG for seizure activity.  Continue ongoing stroke workup.  Aggressive risk factor modification.  Patient counseled to be compliant with using Xarelto and taking it with food consistently the same time of the day.  Discussed with patient and family at the bedside.  Greater than 50% time during this 50-minute visit was spent on counseling and coordination of care about his TIA and discussion about stroke evaluation prevention and treatment and answering questions.  Delia Heady, MD Medical Director [facility] Pager: [phone] 05/25/2023 5:22 PM   To contact Stroke Continuity provider, please refer  to [email or web address]. After hours, contact General Neurology

## 2023-05-25 NOTE — ED Notes (Signed)
ED TO INPATIENT HANDOFF REPORT  ED Nurse Name and Phone #: Dahlia Client [phone]  S Name/Age/Gender [name] 89 y.o. male Room/Bed: 040C/040C  Code Status   Code Status: Full Code  Home/SNF/Other Home Patient oriented to: self and place Is this baseline? No   Triage Complete: Triage complete  Chief Complaint NSTEMI (non-ST elevated myocardial infarction) [facility]) [I21.4]  Triage Note BIB [facility] from home, family found pt to be more altered than normal around 1900-1915, has a history of TIA and usually has a hard time coming back to baseline but normally A/ox4. No falls or LOC witnessed by family or EMS  Hx of TIA, Afib    Allergies Allergies  Allergen Reactions   Alteplase Anaphylaxis   Codeine Other (See Comments)    Bad dreams   Diclofenac Sodium Diarrhea   Gemfibrozil Nausea Only    Other Reaction(s): GI Intolerance   Voltaren [Diclofenac Sodium] Diarrhea    Level of Care/Admitting Diagnosis ED Disposition     ED Disposition  Admit   Condition  --   Comment  Hospital [facility] [[id number]]  Level of Care: Progressive [102]  Admit to Progressive based on following criteria: MULTISYSTEM THREATS such as stable sepsis, metabolic/electrolyte imbalance with or without encephalopathy that is responding to early treatment.  May admit patient to [facility] or [facility] if equivalent level of care is available:: No  Covid Evaluation: Asymptomatic - no recent exposure (last 10 days) testing not required  Diagnosis: NSTEMI (non-ST elevated myocardial infarction) [facility]) [[id number]]  Admitting Physician: Angie Fava [[id number]]  Attending Physician: Angie Fava [[id number]]  Certification:: I certify this patient will need inpatient services for at least 2 midnights  Expected Medical Readiness: 05/27/2023          B Medical/Surgery History Past Medical History:  Diagnosis Date   Arthritis    "wrists, knees, back" (12/10/2015)   Asthma     BCC (basal cell carcinoma of skin) 01/28/2017   left inner cheek--sup & nod (CX3, 5FU)   BCC (basal cell carcinoma) 01/26/2017   left chin (molts)   BCC (basal cell carcinoma) 04/01/2017   midback--sup & nod (tx p bx)   BCC (basal cell carcinoma) 10/13/2017   above left outer eye brow--infiltrative (CX3, 5FU)   BCC (basal cell carcinoma) 06/02/2018   right post forearm--nod (tx p bx)   BCC (basal cell carcinoma) 03/09/2019   right mid outer cheek--nod--(tx p bx)   Cerebral atherosclerosis    Chronic back pain    "lower primarily" (12/10/2015)   CORONARY ATHEROSCLEROSIS NATIVE CORONARY ARTERY    Myocardial infarction at [facility] in 1996 with a directional atherectomy of the 90% LAD stenosis and medical management of 60-70% proximal circumflex and 50% RCA stenosis.   DYSLIPIDEMIA    GERD (gastroesophageal reflux disease)    History of hiatal hernia    HYPERTENSION    ILIAC ARTERY ANEURYSM    Myocardial infarction (HCC) 1996   SCC (squamous cell carcinoma) 10/29/2016   left ear--in situ (tx p bx)   SCC (squamous cell carcinoma) 01/26/2017   left sideburn--well diff--(CX3, 5FU)   SCC (squamous cell carcinoma) 01/28/2017   left upper cheek--in situ (CX3, 5FU)   SCC (squamous cell carcinoma) 05/06/2017   left cheek--sup &well diff (tx p bx)   SCC (squamous cell carcinoma) 10/13/2017   right outer forearm--in situ (CX3 5FU)   SCC (squamous cell carcinoma) 10/13/2017   right ouer forarm--well diff (CX3 5FU)  SCC (squamous cell carcinoma) 10/13/2017   left sideburn, superior--well diff (CX3, 5FU)   SCC (squamous cell carcinoma) 06/02/2018   right forearm--in situ (tx p bx)   SCC (squamous cell carcinoma) 03/09/2019   left zy goma--well diff (tx p bx)   SCC (squamous cell carcinoma) 03/09/2019   right pre auricular--well diff (tx p bx)   SCCA (squamous cell carcinoma) of skin 05/22/2021   Left Buccal Cheek (in situ)   Stroke (HCC) 2003; 2013; 07/2015; 12/05/2015   denies  residual on 12/10/2015, 12/13/16, 01/08/17   TIA (transient ischemic attack)    Type II diabetes mellitus (HCC)    UPPER RESPIRATORY INFECTION    Past Surgical History:  Procedure Laterality Date   ANAL FISSURE REPAIR  1990s   BASAL CELL CARCINOMA EXCISION Right 12/2015   "temple"   CATARACT EXTRACTION W/ INTRAOCULAR LENS  IMPLANT, BILATERAL Bilateral 2000s   CORONARY ANGIOPLASTY  1996   KNEE CARTILAGE SURGERY Right 1970s   LOOP RECORDER INSERTION N/A 12/15/2016   Procedure: Loop Recorder Insertion;  Surgeon: Hillis Range, MD;  Location: [facility] INVASIVE CV LAB;  Service: Cardiovascular;  Laterality: N/A;   SHOULDER ARTHROSCOPY W/ ROTATOR CUFF REPAIR Left 2000s   TEE WITHOUT CARDIOVERSION N/A 12/15/2016   Procedure: TRANSESOPHAGEAL ECHOCARDIOGRAM (TEE);  Surgeon: Lewayne Bunting, MD;  Location: [facility] ENDOSCOPY;  Service: Cardiovascular;  Laterality: N/A;     A IV Location/Drains/Wounds Patient Lines/Drains/Airways Status     Active Line/Drains/Airways     Name Placement date Placement time Site Days   Peripheral IV 05/24/23 18 G Distal;Left;Upper Antecubital 05/24/23  --  Antecubital  1            Intake/Output Last 24 hours No intake or output data in the 24 hours ending 05/25/23 4098  Labs/Imaging Results for orders placed or performed during the hospital encounter of 05/24/23 (from the past 48 hour(s))  Urinalysis, w/ Reflex to Culture (Infection Suspected) -Urine, Clean Catch     Status: Abnormal   Collection Time: 05/24/23  9:25 PM  Result Value Ref Range   Specimen Source URINE, CLEAN CATCH    Color, Urine YELLOW YELLOW   APPearance CLEAR CLEAR   Specific Gravity, Urine 1.013 1.005 - 1.030   pH 6.0 5.0 - 8.0   Glucose, UA NEGATIVE NEGATIVE mg/dL   Hgb urine dipstick MODERATE (A) NEGATIVE   Bilirubin Urine NEGATIVE NEGATIVE   Ketones, ur NEGATIVE NEGATIVE mg/dL   Protein, ur 30 (A) NEGATIVE mg/dL   Nitrite NEGATIVE NEGATIVE   Leukocytes,Ua NEGATIVE NEGATIVE   RBC / HPF  >50 0 - 5 RBC/hpf   WBC, UA 6-10 0 - 5 WBC/hpf    Comment:        Reflex urine culture not performed if WBC <=10, OR if Squamous epithelial cells >5. If Squamous epithelial cells >5 suggest recollection.    Bacteria, UA RARE (A) NONE SEEN   Squamous Epithelial / HPF 0-5 0 - 5 /HPF   Mucus PRESENT    Hyaline Casts, UA PRESENT     Comment: Performed at [facility], [address] [place], [place] [address]  CBC with Differential     Status: Abnormal   Collection Time: 05/24/23  9:26 PM  Result Value Ref Range   WBC 9.2 4.0 - 10.5 K/uL   RBC 3.82 (L) 4.22 - 5.81 MIL/uL   Hemoglobin 13.0 13.0 - 17.0 g/dL   HCT 78.2 (L) 95.6 - 21.3 %   MCV 96.3 80.0 - 100.0  fL   MCH 34.0 26.0 - 34.0 pg   MCHC 35.3 30.0 - 36.0 g/dL   RDW 32.4 40.1 - 02.7 %   Platelets 172 150 - 400 K/uL   nRBC 0.0 0.0 - 0.2 %   Neutrophils Relative % 76 %   Neutro Abs 6.8 1.7 - 7.7 K/uL   Lymphocytes Relative 13 %   Lymphs Abs 1.2 0.7 - 4.0 K/uL   Monocytes Relative 10 %   Monocytes Absolute 1.0 0.1 - 1.0 K/uL   Eosinophils Relative 1 %   Eosinophils Absolute 0.1 0.0 - 0.5 K/uL   Basophils Relative 0 %   Basophils Absolute 0.0 0.0 - 0.1 K/uL   Immature Granulocytes 0 %   Abs Immature Granulocytes 0.04 0.00 - 0.07 K/uL    Comment: Performed at [facility], [address] [place], [place] [address]  Comprehensive metabolic panel     Status: Abnormal   Collection Time: 05/24/23  9:26 PM  Result Value Ref Range   Sodium 130 (L) 135 - 145 mmol/L   Potassium 3.9 3.5 - 5.1 mmol/L   Chloride 93 (L) 98 - 111 mmol/L   CO2 23 22 - 32 mmol/L   Glucose, Bld 194 (H) 70 - 99 mg/dL    Comment: Glucose reference range applies only to samples taken after fasting for at least 8 hours.   BUN 19 8 - 23 mg/dL   Creatinine, Ser 4.40 (H) 0.61 - 1.24 mg/dL   Calcium 8.9 8.9 - 34.7 mg/dL   Total Protein 6.8 6.5 - 8.1 g/dL   Albumin 3.7 3.5 - 5.0 g/dL   AST 34 15 - 41 U/L   ALT 21 0 - 44 U/L   Alkaline  Phosphatase 73 38 - 126 U/L   Total Bilirubin 1.3 (H) 0.3 - 1.2 mg/dL   GFR, Estimated 51 (L) >60 mL/min    Comment: (NOTE) Calculated using the CKD-EPI Creatinine Equation (2021)    Anion gap 14 5 - 15    Comment: Performed at [facility], [address], [place], [place] [address]  Magnesium     Status: Abnormal   Collection Time: 05/24/23  9:26 PM  Result Value Ref Range   Magnesium 1.4 (L) 1.7 - 2.4 mg/dL    Comment: Performed at [facility], [address], [place], [place] [address]  Troponin I (High Sensitivity)     Status: Abnormal   Collection Time: 05/24/23  9:26 PM  Result Value Ref Range   Troponin I (High Sensitivity) 1,131 (HH) <18 ng/L    Comment: CRITICAL RESULT CALLED TO, READ BACK BY AND VERIFIED WITH E. RAYMOND, RN AT [phone] JLASIGAN (NOTE) Elevated high sensitivity troponin I (hsTnI) values and significant  changes across serial measurements may suggest ACS but many other  chronic and acute conditions are known to elevate hsTnI results.  Refer to the "Links" section for chest pain algorithms and additional  guidance. Performed at [facility], [address], [place], [place] [address]   TSH     Status: None   Collection Time: 05/24/23  9:26 PM  Result Value Ref Range   TSH 2.561 0.350 - 4.500 uIU/mL    Comment: Performed by a 3rd Generation assay with a functional sensitivity of <=0.01 uIU/mL. Performed at [facility], [address], [place], [place] [address]   Troponin I (High Sensitivity)     Status: Abnormal   Collection Time: 05/24/23 11:45  PM  Result Value Ref Range   Troponin I (High Sensitivity) 1,083 (HH) <18 ng/L    Comment: CRITICAL VALUE NOTED. VALUE IS CONSISTENT WITH PREVIOUSLY REPORTED/CALLED VALUE (NOTE) Elevated high sensitivity troponin I (hsTnI) values and significant  changes across serial measurements may suggest ACS but many other  chronic and acute conditions are known to elevate hsTnI results.   Refer to the "Links" section for chest pain algorithms and additional  guidance. Performed at [facility], [address], [place], [place] [address]   Brain natriuretic peptide     Status: Abnormal   Collection Time: 05/25/23  3:16 AM  Result Value Ref Range   B Natriuretic Peptide 703.3 (H) 0.0 - 100.0 pg/mL    Comment: Performed at [facility], [address] [place], [place] [address]  CBC with Differential/Platelet     Status: Abnormal   Collection Time: 05/25/23  3:16 AM  Result Value Ref Range   WBC 8.0 4.0 - 10.5 K/uL   RBC 3.50 (L) 4.22 - 5.81 MIL/uL   Hemoglobin 11.9 (L) 13.0 - 17.0 g/dL   HCT 09.8 (L) 11.9 - 14.7 %   MCV 97.1 80.0 - 100.0 fL   MCH 34.0 26.0 - 34.0 pg   MCHC 35.0 30.0 - 36.0 g/dL   RDW 82.9 56.2 - 13.0 %   Platelets 152 150 - 400 K/uL   nRBC 0.0 0.0 - 0.2 %   Neutrophils Relative % 65 %   Neutro Abs 5.2 1.7 - 7.7 K/uL   Lymphocytes Relative 21 %   Lymphs Abs 1.7 0.7 - 4.0 K/uL   Monocytes Relative 12 %   Monocytes Absolute 1.0 0.1 - 1.0 K/uL   Eosinophils Relative 1 %   Eosinophils Absolute 0.1 0.0 - 0.5 K/uL   Basophils Relative 0 %   Basophils Absolute 0.0 0.0 - 0.1 K/uL   Immature Granulocytes 1 %   Abs Immature Granulocytes 0.04 0.00 - 0.07 K/uL    Comment: Performed at [facility], [address] [place], [place] [address]  Comprehensive metabolic panel     Status: Abnormal   Collection Time: 05/25/23  3:16 AM  Result Value Ref Range   Sodium 129 (L) 135 - 145 mmol/L   Potassium 3.6 3.5 - 5.1 mmol/L   Chloride 93 (L) 98 - 111 mmol/L   CO2 23 22 - 32 mmol/L   Glucose, Bld 158 (H) 70 - 99 mg/dL    Comment: Glucose reference range applies only to samples taken after fasting for at least 8 hours.   BUN 19 8 - 23 mg/dL   Creatinine, Ser 4.69 (H) 0.61 - 1.24 mg/dL   Calcium 8.7 (L) 8.9 - 10.3 mg/dL   Total Protein 6.0 (L) 6.5 - 8.1 g/dL   Albumin 3.4 (L) 3.5 - 5.0 g/dL   AST 28 15 - 41 U/L   ALT 18 0 - 44 U/L    Alkaline Phosphatase 64 38 - 126 U/L   Total Bilirubin 1.4 (H) 0.3 - 1.2 mg/dL   GFR, Estimated 47 (L) >60 mL/min    Comment: (NOTE) Calculated using the CKD-EPI Creatinine Equation (2021)    Anion gap 13 5 - 15    Comment: Performed at [facility], [address] [place], [place] [address]  Magnesium     Status: Abnormal   Collection Time: 05/25/23  3:16 AM  Result Value Ref Range   Magnesium 1.5 (L) 1.7 - 2.4  mg/dL    Comment: Performed at [facility], [address], [place], [place] [address]  Lipid panel     Status: Abnormal   Collection Time: 05/25/23  3:16 AM  Result Value Ref Range   Cholesterol 88 0 - 200 mg/dL   Triglycerides 79 <914 mg/dL   HDL 40 (L) >78 mg/dL   Total CHOL/HDL Ratio 2.2 RATIO   VLDL 16 0 - 40 mg/dL   LDL Cholesterol 32 0 - 99 mg/dL    Comment:        Total Cholesterol/HDL:CHD Risk Coronary Heart Disease Risk Table                     Men   Women  1/2 Average Risk   3.4   3.3  Average Risk       5.0   4.4  2 X Average Risk   9.6   7.1  3 X Average Risk  23.4   11.0        Use the calculated Patient Ratio above and the CHD Risk Table to determine the patient's CHD Risk.        ATP III CLASSIFICATION (LDL):  <100     mg/dL   Optimal  295-621  mg/dL   Near or Above                    Optimal  130-159  mg/dL   Borderline  308-657  mg/dL   High  >846     mg/dL   Very High Performed at [facility], [address], [place], [place] [address]   CBG monitoring, ED     Status: Abnormal   Collection Time: 05/25/23  7:53 AM  Result Value Ref Range   Glucose-Capillary 154 (H) 70 - 99 mg/dL    Comment: Glucose reference range applies only to samples taken after fasting for at least 8 hours.   CT ANGIO HEAD NECK W WO CM  Addendum Date: 05/25/2023   ADDENDUM REPORT: 05/25/2023 05:00 ADDENDUM: Study discussed by telephone with Dr. Erick Blinks on 05/25/2023 at 0449 hours. Electronically Signed   By: Odessa Fleming M.D.   On:  05/25/2023 05:00   Result Date: 05/25/2023 CLINICAL DATA:  89 year old male TIA, altered mental status. EXAM: CT ANGIOGRAPHY HEAD AND NECK WITH CONTRAST TECHNIQUE: Multidetector CT imaging of the head and neck was performed using the standard protocol during bolus administration of intravenous contrast. Multiplanar CT image reconstructions and MIPs were obtained to evaluate the vascular anatomy. Carotid stenosis measurements (when applicable) are obtained utilizing NASCET criteria, using the distal internal carotid diameter as the denominator. RADIATION DOSE REDUCTION: This exam was performed according to the departmental dose-optimization program which includes automated exposure control, adjustment of the mA and/or kV according to patient size and/or use of iterative reconstruction technique. CONTRAST:  60mL OMNIPAQUE IOHEXOL 350 MG/ML SOLN COMPARISON:  Head CT and brain MRI yesterday. Previous CTA head and neck 02/18/2020. FINDINGS: CTA NECK Skeleton: Cervical spine degeneration, including advanced at C1-C2 with partially calcified ligamentous hypertrophy there. No acute osseous abnormality identified. Upper chest: Stable upper lungs, mild respiratory motion. No superior mediastinal lymphadenopathy. Other neck: No acute finding. Aortic arch: Calcified aortic atherosclerosis. 3 vessel arch. Brachiocephalic origin not completely included today. Right carotid system: Brachiocephalic artery plaque without significant stenosis. Tortuous right CCA origin with only mild plaque and no significant stenosis. Heavily calcified right ICA origin and bulb. At least 60 %  stenosis with respect to the distal vessel at the bulb on series 7, image 110 does not appear significantly changed from 2021. The right ICA remains patent to the skull base with mild additional calcified plaque at C1-C2. Left carotid system: Left CCA origin calcified plaque without stenosis. Tortuous proximal left CCA. Confluent calcified plaque at the left  ICA origin and bulb with up to 50% stenosis, stable since 2021. Left ICA remains patent with mild additional calcified plaque below the skull base. Vertebral arteries: Heavily calcified and tortuous right subclavian artery origin with severe stenosis superimposed on a kinked appearance of the vessel series 11, image 85. This is chronic, but progressed compared to 2021. Right vertebral artery origin is heavily calcified on series 10, image 85, severely stenotic. That appearance is similar to the 2021 CTA, but the right vertebral artery is newly occluded in the lower neck, V1 segment and there is no reconstitution until the skull base. Proximal left subclavian artery calcified plaque and tortuosity does not appear hemodynamically significant. Left vertebral artery origin is patent and tortuous without stenosis (series 10, image 91). Left vertebral artery caliber is similar to the 2021 CTA, the vessel remains patent to the skull base without stenosis. CTA HEAD Posterior circulation: Mild distal left vertebral artery calcified plaque and stenosis to the vertebrobasilar junction. Patent left PICA. Reconstituted right V4 segment, probably in a retrograde fashion from the vertebrobasilar junction. Right PICA also faintly enhancing. Patent basilar artery with mild irregularity, no significant stenosis. Fetal type right PCA origin again noted. Patent SCA origins. Bilateral PCAs are patent with only mild irregularity and stenosis at the left P2/P3 segment which is chronic and stable or improved. Anterior circulation: Both ICA siphons are patent. Heavily calcified left siphon with some underlying ectasia. Mild left siphon stenosis does appear progressed since 2021. Similar contralateral right siphon heavy calcification. Mild to moderate distal cavernous and proximal supraclinoid right siphon stenosis does appear increased since 2021. Right posterior communicating artery origin remains normal. Carotid termini and MCA and ACA  origins remain patent. Tortuous A1 segments. Normal anterior communicating artery. Tortuous bilateral ACA branches with some calcified plaque, but stable with only mild stenosis. Left MCA M1 segment and tortuous bifurcation are patent without stenosis. Right MCA M1 segment is patent with mild chronic irregularity and stenosis in the proximal M1 near the origin (series 13, image 31, stable. Right MCA trifurcation is patent without stenosis. No MCA branch occlusion is identified. Mild to moderate posterior division MCA M3 and M4 branch irregularity bilaterally with stenosis does not appear significantly changed since 2021 (series 14, image 28 on the left. Venous sinuses: Early contrast timing, not evaluated. Anatomic variants: Fetal type right PCA origin. Review of the MIP images confirms the above findings IMPRESSION: 1. Positive for abnormal Right Vertebral Artery, new since a 2021 CTA: Differential considerations include atherosclerotic Occlusion of the Right V1 segment (severe calcified atherosclerotic stenosis at the right vertebral origin) versus slow retrograde flow within the vessel due to Right Subclavian Steal (see #2). Doppler Ultrasound evaluation of the right vertebral (Carotid Doppler exam) may be the simplest way to differentiate. 2. Very Severe stenosis of the Right Subclavian Artery origin due to combined bulky calcified plaque and tortuosity, progressed since 2021. The right subclavian remains patent. 3. No other large vessel occlusion. No hemodynamically significant left vertebral artery or basilar stenosis. Extensive, bulky calcified atherosclerosis of both proximal ICAs and both ICA siphons: - 60% stenosis RICA bulb is stable but Moderate right ICA siphon stenosis  appears progressed since 2021. - 50% stenosis proximal LICA is stable since 2021. Mild left ICA siphon stenosis appears progressed. 4. Intermittent circle-of-Willis branch mild to moderate atherosclerosis: Right MCA M1, left PCA P2/P3,  bilateral MCA branches. 5.  Aortic Atherosclerosis (ICD10-I70.0). Electronically Signed: By: Odessa Fleming M.D. On: 05/25/2023 04:31   MR BRAIN WO CONTRAST  Result Date: 05/25/2023 CLINICAL DATA:  Transient aphasia and altered mental status EXAM: MRI HEAD WITHOUT CONTRAST TECHNIQUE: Multiplanar, multiecho pulse sequences of the brain and surrounding structures were obtained without intravenous contrast. COMPARISON:  02/18/2020 FINDINGS: Brain: No acute infarct, mass effect or extra-axial collection. No acute or chronic hemorrhage. There is multifocal hyperintense T2-weighted signal within the white matter. Generalized volume loss. There are old bilateral cerebellar infarcts. The midline structures are normal. Vascular: Normal flow voids. Skull and upper cervical spine: Normal calvarium and skull base. Visualized upper cervical spine and soft tissues are normal. Sinuses/Orbits:No paranasal sinus fluid levels or advanced mucosal thickening. No mastoid or middle ear effusion. Normal orbits. Other: None. IMPRESSION: 1. No acute intracranial abnormality. 2. Old bilateral cerebellar infarcts and findings of chronic small vessel disease. Electronically Signed   By: Deatra Robinson M.D.   On: 05/25/2023 01:12   DG Chest Portable 1 View  Result Date: 05/24/2023 CLINICAL DATA:  Chest pain and burning, initial encounter EXAM: PORTABLE CHEST 1 VIEW COMPARISON:  07/09/2016 FINDINGS: Cardiac shadow is enlarged but stable. Aortic calcifications are noted. Loop recorder is seen. The lungs are well aerated bilaterally without focal infiltrate or effusion. Mild central vascular congestion is noted. No bony abnormality is noted. IMPRESSION: Mild central vascular congestion. Electronically Signed   By: Alcide Clever M.D.   On: 05/24/2023 23:10   CT HEAD WO CONTRAST ( )  Result Date: 05/24/2023 CLINICAL DATA:  Altered mental status EXAM: CT HEAD WITHOUT CONTRAST TECHNIQUE: Contiguous axial images were obtained from the base of  the skull through the vertex without intravenous contrast. RADIATION DOSE REDUCTION: This exam was performed according to the departmental dose-optimization program which includes automated exposure control, adjustment of the mA and/or kV according to patient size and/or use of iterative reconstruction technique. COMPARISON:  06/27/2019 FINDINGS: Brain: No evidence of acute infarction, hemorrhage, hydrocephalus, extra-axial collection or mass lesion/mass effect. Chronic atrophic and ischemic changes are noted. Vascular: No hyperdense vessel or unexpected calcification. Skull: Normal. Negative for fracture or focal lesion. Sinuses/Orbits: No acute finding. Other: None. IMPRESSION: Chronic atrophic and ischemic changes stable from the prior exam. Electronically Signed   By: Alcide Clever M.D.   On: 05/24/2023 23:10    Pending Labs Unresulted Labs (From admission, onward)    None       Vitals/Pain Today's Vitals   05/25/23 0235 05/25/23 0530 05/25/23 0604 05/25/23 0743  BP: (!) 128/108 91/65  113/84  Pulse: 92 74  86  Resp: 18 17  (!) 23  Temp:   97.9 F (36.6 C)   TempSrc:   Oral   SpO2: 97% 97%  97%  PainSc:        Isolation Precautions No active isolations  Medications Medications  acetaminophen (TYLENOL) tablet 650 mg (has no administration in time range)    Or  acetaminophen (TYLENOL) suppository 650 mg (has no administration in time range)  melatonin tablet 3 mg (has no administration in time range)  ondansetron (ZOFRAN) injection 4 mg (has no administration in time range)  mometasone-formoterol (DULERA) 100-5 MCG/ACT inhaler 2 puff (has no administration in time range)  atorvastatin (LIPITOR) tablet 80  mg (has no administration in time range)  benazepril (LOTENSIN) tablet 20 mg (has no administration in time range)  metoprolol tartrate (LOPRESSOR) tablet 100 mg (has no administration in time range)  pantoprazole (PROTONIX) EC tablet 40 mg (has no administration in time range)   rivaroxaban (XARELTO) tablet 20 mg (has no administration in time range)  insulin aspart (novoLOG) injection 0-6 Units (has no administration in time range)  albuterol (PROVENTIL) (2.5 MG/3ML) 0.083% nebulizer solution 2.5 mg (has no administration in time range)  alum & mag hydroxide-simeth (MAALOX/MYLANTA) 200-200-20 MG/5ML suspension 30 mL (30 mLs Oral Given 05/24/23 2200)    And  lidocaine (XYLOCAINE) 2 % viscous mouth solution 15 mL (15 mLs Oral Given 05/24/23 2200)  LORazepam (ATIVAN) injection 0.5 mg (0.5 mg Intravenous Given 05/24/23 2227)  aspirin tablet 325 mg (325 mg Oral Given 05/25/23 0226)   stroke: early stages of recovery book ( Does not apply Given 05/25/23 0226)  magnesium sulfate IVPB 2 g 50 mL (0 g Intravenous Stopped 05/25/23 0602)  iohexol (OMNIPAQUE) 350 MG/ML injection 60 mL (60 mLs Intravenous Contrast Given 05/25/23 0251)  furosemide (LASIX) injection 40 mg (40 mg Intravenous Given 05/25/23 0601)    Mobility walks with person assist     Focused Assessments Neuro Assessment Handoff:  Swallow screen pass? Yes  Cardiac Rhythm: Normal sinus rhythm   Last date known well: 05/24/23 Last time known well: 1915 Neuro Assessment: Exceptions to WDL Neuro Checks:      Has TPA been given? No If patient is a Neuro Trauma and patient is going to OR before floor call report to 4N Charge nurse: [phone] or [phone]   R Recommendations: See Admitting Provider Note  Report given to:   Additional Notes:

## 2023-05-25 NOTE — Assessment & Plan Note (Addendum)
Patient with recovered focal neurologic deficit. He has been transferred to the progressive care unit from the ED. Having agitation and confusion, difficult to re direct.  Acute metabolic encephalopathy with features of delirium.  Will order sitter one to one for safety. Add as needed haloperidol.  Continue neuro checks per unit protocol Follow up with further neurology recommendations.

## 2023-05-25 NOTE — ED Notes (Signed)
Tech at bedside to initiate EEG.

## 2023-05-25 NOTE — Evaluation (Signed)
Occupational Therapy Evaluation Patient Details Name: [name] MRN: [id number] DOB: [birth year] Today's Date: 05/25/2023   History of Present Illness Patient is a 89 year old male with episode of word finding difficulty that resolved, elevated troponin being admitted for evaluation of NSTEMI.   Clinical Impression   PTA, pt mod I within the home for ADL and family/spouse's aide assists with medication management and bathing. Spouse has caregiver at home as well. Today, pt performing basic transfers with CGA with RW and functional mobility with up to min A with RW secondary to decr balance. Pt limited by fatigue and decr activity tolerance but VSS and no significant dyspnea noted. Also with confusion, decr memory, and problem solving. Pt encouraged to use his RW at home. Recommend home with HHOT/PT/aide to optimize safety and independence with return home.       If plan is discharge home, recommend the following: A little help with walking and/or transfers;A little help with bathing/dressing/bathroom;Assistance with cooking/housework;Direct supervision/assist for medications management;Direct supervision/assist for financial management;Assist for transportation;Help with stairs or ramp for entrance    Functional Status Assessment  Patient has had a recent decline in their functional status and demonstrates the ability to make significant improvements in function in a reasonable and predictable amount of time.  Equipment Recommendations  BSC/3in1    Recommendations for Other Services       Precautions / Restrictions Precautions Precautions: Fall Precaution Comments: OK to mobilize within limit of symptoms (dyspnea or "indigestion") per cardiology Restrictions Weight Bearing Restrictions: No      Mobility Bed Mobility Overal bed mobility: Needs Assistance Bed Mobility: Supine to Sit, Sit to Supine     Supine to sit: Contact guard Sit to supine: Contact guard assist    General bed mobility comments: increased time required    Transfers Overall transfer level: Needs assistance Equipment used: Rolling walker (2 wheels) Transfers: Sit to/from Stand Sit to Stand: Contact guard assist           General transfer comment: cues for safety, task initiation.      Balance Overall balance assessment: Needs assistance Sitting-balance support: Feet supported, No upper extremity supported Sitting balance-Leahy Scale: Fair     Standing balance support: Bilateral upper extremity supported Standing balance-Leahy Scale: Poor Standing balance comment: relying on rolling walker for support in standing                           ADL either performed or assessed with clinical judgement   ADL Overall ADL's : Needs assistance/impaired Eating/Feeding: Set up;Sitting   Grooming: Set up;Sitting   Upper Body Bathing: Set up;Sitting   Lower Body Bathing: Sit to/from stand;Set up;Contact guard assist   Upper Body Dressing : Set up;Sitting   Lower Body Dressing: Bed level;Set up Lower Body Dressing Details (indicate cue type and reason): pulling up socks long sititng in bed Toilet Transfer: Contact guard assist;Minimal assistance;Ambulation;Rolling walker (2 wheels)           Functional mobility during ADLs: Contact guard assist;Rolling walker (2 wheels)       Vision Baseline Vision/History: 1 Wears glasses Ability to See in Adequate Light: 0 Adequate Patient Visual Report: No change from baseline Vision Assessment?: No apparent visual deficits Additional Comments: not formally assessed     Perception Perception: Not tested       Praxis Praxis: Not tested       Pertinent Vitals/Pain Pain Assessment Pain Assessment: No/denies pain  Extremity/Trunk Assessment Upper Extremity Assessment Upper Extremity Assessment: Generalized weakness   Lower Extremity Assessment Lower Extremity Assessment: Defer to PT evaluation        Communication Communication Communication: Hearing impairment Cueing Techniques: Verbal cues;Gestural cues   Cognition Arousal: Alert Behavior During Therapy: WFL for tasks assessed/performed Overall Cognitive Status: Impaired/Different from baseline Area of Impairment: Following commands, Safety/judgement, Problem solving, Orientation, Memory                 Orientation Level: Disoriented to, Time   Memory: Decreased short-term memory Following Commands: Follows one step commands with increased time Safety/Judgement: Decreased awareness of deficits, Decreased awareness of safety   Problem Solving: Slow processing, Requires verbal cues, Requires tactile cues General Comments: family member in the room report the patient is "90%" better mentally than yesterday but still has some confusion compared to baseline.     General Comments  encouraged RW use at home instead of cane for now.    Exercises     Shoulder Instructions      Home Living Family/patient expects to be discharged to:: Private residence Living Arrangements: Spouse/significant other Available Help at Discharge: Family;Available PRN/intermittently;Personal care attendant Type of Home: House Home Access: Stairs to enter [employer] of Steps: 3   Home Layout: One level     Bathroom Shower/Tub: Tub/shower unit         Home Equipment: Agricultural consultant (2 wheels);Cane - single point;Shower seat   Additional Comments: spouse requires a caregiver that comes in 3 days per week. the cargiver also helps with cooking, cleaning, and PRN with assisting the patient      Prior Functioning/Environment Prior Level of Function : Independent/Modified Independent             Mobility Comments: using cane for ambulation. ADLs Comments: has assistance for showering; family provides assist with grocery shopping and medication management.        OT Problem List: Decreased strength;Decreased activity  tolerance;Impaired balance (sitting and/or standing);Decreased coordination;Decreased cognition;Decreased safety awareness;Decreased knowledge of use of DME or AE      OT Treatment/Interventions: Self-care/ADL training;Therapeutic exercise;DME and/or AE instruction;Balance training;Patient/family education;Therapeutic activities;Cognitive remediation/compensation    OT Goals(Current goals can be found in the care plan section) Acute Rehab OT Goals Patient Stated Goal: go home OT Goal Formulation: With patient Time For Goal Achievement: 06/08/23 Potential to Achieve Goals: Good  OT Frequency: Min 1X/week    Co-evaluation PT/OT/SLP Co-Evaluation/Treatment: Yes Reason for Co-Treatment: Complexity of the patient's impairments (multi-system involvement) PT goals addressed during session: Mobility/safety with mobility OT goals addressed during session: ADL's and self-care      AM-PAC OT "6 Clicks" Daily Activity     Outcome Measure Help from another person eating meals?: A Little Help from another person taking care of personal grooming?: A Little Help from another person toileting, which includes using toliet, bedpan, or urinal?: A Little Help from another person bathing (including washing, rinsing, drying)?: A Little Help from another person to put on and taking off regular upper body clothing?: A Little Help from another person to put on and taking off regular lower body clothing?: A Little 6 Click Score: 18   End of Session Equipment Utilized During Treatment: Gait belt;Rolling walker (2 wheels) Nurse Communication: Mobility status  Activity Tolerance: Patient tolerated treatment well Patient left: in bed;with call bell/phone within reach;with family/visitor present  OT Visit Diagnosis: Unsteadiness on feet (R26.81);Muscle weakness (generalized) (M62.81);Other symptoms and signs involving cognitive function;Cognitive communication deficit (R41.841) (intermittent word  finding  difficulty?)                Time: 5621-3086 OT Time Calculation (min): 20 min Charges:  OT General Charges $OT Visit: 1 Visit OT Evaluation $OT Eval Moderate Complexity: 1 Mod  Tyler Deis, OTR/L [facility] Acute Rehabilitation Office: [phone]   Myrla Halsted 05/25/2023, 1:32 PM

## 2023-05-25 NOTE — Assessment & Plan Note (Signed)
Continue blood pressure control with metoprolol and benazepril.

## 2023-05-25 NOTE — Progress Notes (Signed)
   Patient Name: [name] Date of Encounter: 05/25/2023 [facility] Cardiologist: Rollene Rotunda, MD   Interval Summary  .    Doing better.  Yesterday he was unwell because he had "indigestion" and then developed shortness of breath which he felt was due to a "asthma attack". He became extremely weak, but never lost consciousness. Currently without chest discomfort and breathing has improved. Head and neck CT angiogram shows probable high-grade stenosis in the right subclavian artery, tortuous but patent left subclavian artery, moderate stenosis in the right internal carotid artery, plaque without significant stenosis in the left internal carotid artery.  Vital Signs .    Vitals:   05/25/23 0235 05/25/23 0530 05/25/23 0604 05/25/23 0743  BP: (!) 128/108 91/65  113/84  Pulse: 92 74  86  Resp: 18 17  (!) 23  Temp:   97.9 F (36.6 C)   TempSrc:   Oral   SpO2: 97% 97%  97%   No intake or output data in the 24 hours ending 05/25/23 0935    04/21/2023   10:47 AM 01/14/2023   10:07 AM 01/07/2023    9:49 AM  Last 3 Weights  Weight (lbs) 188 lb 3.2 oz 183 lb 196 lb  Weight (kg) 85.367 kg 83.008 kg 88.905 kg      Telemetry/ECG    Normal sinus rhythm- Personally Reviewed  Physical Exam .   GEN: No acute distress.   Neck: No JVD, delayed carotid upstroke with bilateral bruit Cardiac: Normal S1, absent aortic component of S2, RRR, 3/6 late peaking aortic ejection murmur, no diastolic murmurs, rubs, or gallops.  Respiratory: Clear to auscultation bilaterally. GI: Soft, nontender, non-distended  MS: No edema  Assessment & Plan .     Troponin increase has been relatively mild and adynamic.  I think this most likely represents demand infarction but cannot entirely exclude a new acute coronary syndrome. Suspect that he now has critical aortic stenosis and the decompensating event may have been a transient arrhythmia, such as paroxysmal atrial fibrillation. On the other  hand he has had increased symptoms of "reflux" over the last week which may represent unstable angina due to a ruptured coronary plaque. He remains opposed to surgery and generally would like a less invasive approach, but after we discussed the natural history of aortic stenosis and the possible option for TAVR he is willing to consider this.  He will talk it over with his sons. We reviewed the fact that he would have to undergo a CT angiogram to assess for arterial access as well as a repeat coronary angiogram.  Since he received contrast for the head and neck CT would not start the workup until tomorrow.  Will also hold off from any additional doses of diuretic at this time. Will talk to him again after we see the results of his follow-up echocardiogram.    For questions or updates, please contact  [facility] Please consult [email or web address] for contact info under        Signed, Thurmon Fair, MD

## 2023-05-25 NOTE — Assessment & Plan Note (Addendum)
Hyponatremia, hypomagnesemia.   Renal function with serum cr at 1.42 with K at 3,6 and serum bicarbonate at 23. Na 129 and Mg 1,5   Hold on further diuresis.  Add 4 g mag sulfate IV Follow up renal function and electrolytes in am.

## 2023-05-25 NOTE — Assessment & Plan Note (Signed)
Continue rate control with metoprolol and current anticoagulation with IV heparin.

## 2023-05-25 NOTE — ED Notes (Signed)
Went into room and pt was out of bed attempting to urinate. Daughter was with pt attempting to get him back in bed. Reoriented pt, assisted back into bed, and assisted with use of urinal.

## 2023-05-25 NOTE — H&P (Addendum)
History and Physical      [name] DGU:[id number] DOB: 07-Oct-1929 DOA: 05/24/2023; DOS: 05/25/2023  PCP: Dettinger, Elige Radon, MD  Patient coming from: home   I have personally briefly reviewed patient's old medical records in [facility]  Chief Complaint: Aphasia  HPI: [name] is a 89 y.o. male with medical history significant for essential hypertension, hyperlipidemia, coronary disease status post MI in 1996 at which time he underwent angioplasty, type 2 diabetes mellitus, paroxysmal atrial fibrillation chronically anticoagulated on Xarelto, chronic diastolic heart failure, severe aortic stenosis, chronic hyponatremia with baseline serum sodium 1 31-1 35, who is admitted to [facility] on 05/24/2023 with NSTEMI after presenting from home to [facility] ED complaining of aphasia.   Patient presents with an episode of aphasia, word finding difficultie that started at 1900 on 05/24/2023, lasting approximately 20 minutes before spontaneously resolving without ensuing recurrence.  This was not associated with any acute focal weakness or any acute focal numbness, paresthesias, vertigo, nausea, vomiting, facial droop, acute change in vision or dysphagia.   Additionally, he experienced a single episode substernal chest burning, without radiation, that was not associate with any shortness of breath, palpitations, diaphoresis.  This episode is subsequently resolved, and the patient is subsequently chest pain-free.  Medical history is notable for essential pretension, hyperlipidemia, type 2 diabetes mellitus, paroxysmal atrial fibrillation for which she is chronically anticoagulated on Xarelto.  No known history of underlying obstructive sleep apnea aside from the Xarelto, not on any additional blood thinners as an outpatient, including antiplatelet medication.  Reports compliance with his outpatient statin in the form of atorvastatin 80 mg p.o. daily.  He also has a documented history  of coronary artery disease status post MI in 1996 at which time he underwent PCI with angioplasty.  He also has a history of chronic diastolic heart failure and severe aortic stenosis, with most recent echocardiogram performed in July 2023, which was notable for LVEF 55 to 60%, no evidence of focal Measurin maladies, grade 1 diastolic dysfunction and normal right ventricular systolic function, mildly dilated right atrium, moderately dilated left atrium, trivial mitral regurgitation, moderate pulmonary regurgitation severe aortic stenosis with aortic valve area calculated by continuity equation with VTI noted to be 0.74 cm2.    ED Course:  Vital signs in the ED were notable for the following: Afebrile; rates in the 70s to 80s; systolic blood pressures initially in the 160s, with ensuing decreased into the range of 120s to 140s; respiratory rate 14-23, oxygen saturation 96 to 100% on room air.  Labs were notable for the following: CMP notable for the following: Sodium 130, which corrects to approximately 131.6 when taking into account concomitant hyperglycemia, this is relative to most recent prior serum sodium data point of 134 on 04/21/2023, potassium 3.9, creatinine 1.32 compared to 1.29 on 04/21/2023, liver enzymes are within normal limits.  Serum magnesium level 1.2.  CBC notable for white cell count 9200.  Urinalysis showed 6-10 white blood cells with negative leukocyte esterase/nitrite findings.  High sensitive troponin I initially 1131, with repeat trending down to 1083, without any prior high-sensitivity troponin I data point available for point comparison.  TSH 2.561.  BNP ordered, Therazole currently pending.  Per my interpretation, EKG in ED demonstrated the following: EKG read as atrial fibrillation with right bundle branch block, left anterior fascicular block, heart rate 75, nonspecific T wave inversion in lead III, no evidence of ST changes, including no evidence of ST elevation.  Imaging  in  the ED, per corresponding formal radiology read, was notable for the following: 1 view chest x-ray showed mild pulmonary vascular congestion, but otherwise no evidence of acute cardiopulmonary process, Cleen no evidence of infiltrate, edema, effusion, or pneumothorax.  CT head showed no evidence of acute intracranial process, Cleen density of intracranial hemorrhage or any evidence of acute infarct.  MRI brain showed no evidence of acute intracranial process clinically known subacute infarct.  CTA head and neck been ordered, with results currently pending.  EDP has discussed patient's case with on-call cardiology fellow, Dr. Elayne Guerin, who will formally consult. Per Dr. Lavell Anchors discussions with the patient, the patient does not want heart cath, rather wants to pursue medical management. Dr Elayne Guerin recommends continuation of outpatient Xarelto and does not recommend initiation of heparin drip along with echocardiogram.  Status post dose of FD aspirin. Regarding concern for TIA, EDP is discussed patient's case with on-call neurology, Dr. Derry Lory, who will formally consult.  CTA head and neck are currently pending.  While in the ED, the following were administered: Maalox/Mylanta with viscous lidocaine x 1 dose, full dose aspirin p.o. x 1, Ativan 0.5 mg IV x 1.  Subsequently, the patient was admitted for further evaluation and management of presenting NSTEMI as well as suspected TIA, with presenting labs also notable for hypomagnesemia.     Review of Systems: As per HPI otherwise 10 point review of systems negative.   Past Medical History:  Diagnosis Date   Arthritis    "wrists, knees, back" (12/10/2015)   Asthma    BCC (basal cell carcinoma of skin) 01/28/2017   left inner cheek--sup & nod (CX3, 5FU)   BCC (basal cell carcinoma) 01/26/2017   left chin (molts)   BCC (basal cell carcinoma) 04/01/2017   midback--sup & nod (tx p bx)   BCC (basal cell carcinoma) 10/13/2017   above left outer  eye brow--infiltrative (CX3, 5FU)   BCC (basal cell carcinoma) 06/02/2018   right post forearm--nod (tx p bx)   BCC (basal cell carcinoma) 03/09/2019   right mid outer cheek--nod--(tx p bx)   Cerebral atherosclerosis    Chronic back pain    "lower primarily" (12/10/2015)   CORONARY ATHEROSCLEROSIS NATIVE CORONARY ARTERY    Myocardial infarction at [facility] in [facility] with a directional atherectomy of the 90% LAD stenosis and medical management of 60-70% proximal circumflex and 50% RCA stenosis.   DYSLIPIDEMIA    GERD (gastroesophageal reflux disease)    History of hiatal hernia    HYPERTENSION    ILIAC ARTERY ANEURYSM    Myocardial infarction (HCC) 1996   SCC (squamous cell carcinoma) 10/29/2016   left ear--in situ (tx p bx)   SCC (squamous cell carcinoma) 01/26/2017   left sideburn--well diff--(CX3, 5FU)   SCC (squamous cell carcinoma) 01/28/2017   left upper cheek--in situ (CX3, 5FU)   SCC (squamous cell carcinoma) 05/06/2017   left cheek--sup &well diff (tx p bx)   SCC (squamous cell carcinoma) 10/13/2017   right outer forearm--in situ (CX3 5FU)   SCC (squamous cell carcinoma) 10/13/2017   right ouer forarm--well diff (CX3 5FU)   SCC (squamous cell carcinoma) 10/13/2017   left sideburn, superior--well diff (CX3, 5FU)   SCC (squamous cell carcinoma) 06/02/2018   right forearm--in situ (tx p bx)   SCC (squamous cell carcinoma) 03/09/2019   left zy goma--well diff (tx p bx)   SCC (squamous cell carcinoma) 03/09/2019   right pre auricular--well diff (tx p bx)   SCCA (squamous  cell carcinoma) of skin 05/22/2021   Left Buccal Cheek (in situ)   Stroke (HCC) 2003; 2013; 07/2015; 12/05/2015   denies residual on 12/10/2015, 12/13/16, 01/08/17   TIA (transient ischemic attack)    Type II diabetes mellitus (HCC)    UPPER RESPIRATORY INFECTION     Past Surgical History:  Procedure Laterality Date   ANAL FISSURE REPAIR  1990s   BASAL CELL CARCINOMA EXCISION Right 12/2015   "temple"    CATARACT EXTRACTION W/ INTRAOCULAR LENS  IMPLANT, BILATERAL Bilateral 2000s   CORONARY ANGIOPLASTY  1996   KNEE CARTILAGE SURGERY Right 1970s   LOOP RECORDER INSERTION N/A 12/15/2016   Procedure: Loop Recorder Insertion;  Surgeon: Hillis Range, MD;  Location: [facility] INVASIVE CV LAB;  Service: Cardiovascular;  Laterality: N/A;   SHOULDER ARTHROSCOPY W/ ROTATOR CUFF REPAIR Left 2000s   TEE WITHOUT CARDIOVERSION N/A 12/15/2016   Procedure: TRANSESOPHAGEAL ECHOCARDIOGRAM (TEE);  Surgeon: Lewayne Bunting, MD;  Location: [facility] ENDOSCOPY;  Service: Cardiovascular;  Laterality: N/A;    Social History:  reports that he quit smoking about 38 years ago. His smoking use included cigarettes. He started smoking about 83 years ago. He has a 5.4 pack-year smoking history. He has quit using smokeless tobacco.  His smokeless tobacco use included snuff. He reports that he does not drink alcohol and does not use drugs.   Allergies  Allergen Reactions   Alteplase Anaphylaxis   Codeine Other (See Comments)    Bad dreams   Lopid [Gemfibrozil] Nausea Only   Voltaren [Diclofenac Sodium] Diarrhea    Family History  Problem Relation Age of Onset   Coronary artery disease Mother    Cancer Father        lung   Stroke Father    Cancer Brother        lung   Stroke Sister     Family history reviewed and not pertinent    Prior to Admission medications   Medication Sig Start Date End Date Taking? Authorizing Provider  Accu-Chek FastClix Lancets MISC Check BS up to 4 TIMES DAILY Dx E11.9 07/15/20   Dettinger, Elige Radon, MD  acetaminophen (TYLENOL) 325 MG tablet Take 650 mg by mouth every 6 (six) hours as needed for mild pain, fever or headache.    [provider]  ADVAIR DISKUS 100-50 MCG/ACT AEPB INHALE 1 PUFF INTO LUNGS EVERY 12 HOURS 04/14/23   Dettinger, Elige Radon, MD  albuterol (VENTOLIN HFA) 108 (90 Base) MCG/ACT inhaler INHALE 1 TO 2 PUFFS BY MOUTH EVERY 6 HOURS AS NEEDED FOR WHEEZING OR SHORTNESS OF  BREATH 04/15/23   Dettinger, Elige Radon, MD  atorvastatin (LIPITOR) 80 MG tablet Take 1 tablet (80 mg total) by mouth daily. 01/14/23   Dettinger, Elige Radon, MD  benazepril (LOTENSIN) 20 MG tablet Take 1 tablet (20 mg total) by mouth daily. 01/14/23   Dettinger, Elige Radon, MD  blood glucose meter kit and supplies KIT Dispense based on patient and insurance preference. Use four times daily as directed.  Diagnosis type 2 diabetes 05/10/19   Dettinger, Elige Radon, MD  Cholecalciferol (VITAMIN D) 2000 units CAPS Take 2,000 Units by mouth daily.    [provider]  feeding supplement, GLUCERNA SHAKE, (GLUCERNA SHAKE) LIQD Take 237 mLs by mouth 2 (two) times daily between meals. 12/13/15   Elenora Gamma, MD  fish oil-omega-3 fatty acids 1000 MG capsule Take 2 g by mouth every evening.    [provider]  glucose blood (ACCU-CHEK GUIDE) test strip  USE 1 STRIP TO CHECK GLUCOSE UP TO 4 TIMES DAILY 06/19/22   Dettinger, Elige Radon, MD  hydrALAZINE (APRESOLINE) 25 MG tablet Take 1 tablet (25 mg total) by mouth daily as needed. 01/14/23   Dettinger, Elige Radon, MD  hydrochlorothiazide (HYDRODIURIL) 25 MG tablet Take 1 tablet by mouth once daily 02/16/23   Dettinger, Elige Radon, MD  metFORMIN (GLUCOPHAGE-XR) 500 MG 24 hr tablet Take 1 tablet (500 mg total) by mouth daily with breakfast. 01/14/23   Dettinger, Elige Radon, MD  metoprolol tartrate (LOPRESSOR) 100 MG tablet Take 1 tablet (100 mg total) by mouth 2 (two) times daily. 01/14/23   Dettinger, Elige Radon, MD  nitroGLYCERIN (NITROSTAT) 0.4 MG SL tablet USE AS DIRECTED 06/08/22   Dettinger, Elige Radon, MD  pantoprazole (PROTONIX) 40 MG tablet Take 1 tablet (40 mg total) by mouth daily. 01/14/23   Dettinger, Elige Radon, MD  rivaroxaban (XARELTO) 20 MG TABS tablet Take 1 tablet (20 mg total) by mouth daily with supper. 01/14/23   Dettinger, Elige Radon, MD     Objective    Physical Exam: Vitals:   05/25/23 0015 05/25/23 0030 05/25/23 0133 05/25/23 0145  BP: 138/73  122/62  103/62  Pulse: 75 76  99  Resp: 20 18  18   Temp:   98 F (36.7 C)   TempSrc:   Oral   SpO2: 99% 99%  96%    General: appears to be stated age; alert, oriented Skin: warm, dry, no rash Head:  AT/[place] Mouth:  Oral mucosa membranes appear moist, normal dentition Neck: supple; trachea midline Heart:  RRR; did not appreciate any M/R/G Lungs: CTAB, did not appreciate any wheezes, rales, or rhonchi Abdomen: + BS; soft, ND, NT Vascular: 2+ pedal pulses b/l; 2+ radial pulses b/l Extremities: no peripheral edema, no muscle wasting Neuro: strength and sensation intact in upper and lower extremities b/l; no evidence suggestive of slurred speech, dysarthria, or facial droop; Normal muscle tone. No tremors.     Labs on Admission: I have personally reviewed following labs and imaging studies  CBC: Recent Labs  Lab 05/24/23 2126  WBC 9.2  NEUTROABS 6.8  HGB 13.0  HCT 36.8*  MCV 96.3  PLT 172   Basic Metabolic Panel: Recent Labs  Lab 05/24/23 2126  NA 130*  K 3.9  CL 93*  CO2 23  GLUCOSE 194*  BUN 19  CREATININE 1.32*  CALCIUM 8.9  MG 1.4*   GFR: CrCl cannot be calculated (Unknown ideal weight.). Liver Function Tests: Recent Labs  Lab 05/24/23 2126  AST 34  ALT 21  ALKPHOS 73  BILITOT 1.3*  PROT 6.8  ALBUMIN 3.7   No results for input(s): "LIPASE", "AMYLASE" in the last 168 hours. No results for input(s): "AMMONIA" in the last 168 hours. Coagulation Profile: No results for input(s): "INR", "PROTIME" in the last 168 hours. Cardiac Enzymes: No results for input(s): "CKTOTAL", "CKMB", "CKMBINDEX", "TROPONINI" in the last 168 hours. BNP (last 3 results) No results for input(s): "PROBNP" in the last 8760 hours. HbA1C: No results for input(s): "HGBA1C" in the last 72 hours. CBG: No results for input(s): "GLUCAP" in the last 168 hours. Lipid Profile: No results for input(s): "CHOL", "HDL", "LDLCALC", "TRIG", "CHOLHDL", "LDLDIRECT" in the last 72  hours. Thyroid Function Tests: Recent Labs    05/24/23 2126  TSH 2.561   Anemia Panel: No results for input(s): "VITAMINB12", "FOLATE", "FERRITIN", "TIBC", "IRON", "RETICCTPCT" in the last 72 hours. Urine analysis:    Component Value Date/Time  COLORURINE YELLOW 05/24/2023 2125   APPEARANCEUR CLEAR 05/24/2023 2125   LABSPEC 1.013 05/24/2023 2125   PHURINE 6.0 05/24/2023 2125   GLUCOSEU NEGATIVE 05/24/2023 2125   HGBUR MODERATE (A) 05/24/2023 2125   BILIRUBINUR NEGATIVE 05/24/2023 2125   BILIRUBINUR neg 05/02/2014 0956   KETONESUR NEGATIVE 05/24/2023 2125   PROTEINUR 30 (A) 05/24/2023 2125   UROBILINOGEN negative 05/02/2014 0956   NITRITE NEGATIVE 05/24/2023 2125   LEUKOCYTESUR NEGATIVE 05/24/2023 2125    Radiological Exams on Admission: MR BRAIN WO CONTRAST  Result Date: 05/25/2023 CLINICAL DATA:  Transient aphasia and altered mental status EXAM: MRI HEAD WITHOUT CONTRAST TECHNIQUE: Multiplanar, multiecho pulse sequences of the brain and surrounding structures were obtained without intravenous contrast. COMPARISON:  02/18/2020 FINDINGS: Brain: No acute infarct, mass effect or extra-axial collection. No acute or chronic hemorrhage. There is multifocal hyperintense T2-weighted signal within the white matter. Generalized volume loss. There are old bilateral cerebellar infarcts. The midline structures are normal. Vascular: Normal flow voids. Skull and upper cervical spine: Normal calvarium and skull base. Visualized upper cervical spine and soft tissues are normal. Sinuses/Orbits:No paranasal sinus fluid levels or advanced mucosal thickening. No mastoid or middle ear effusion. Normal orbits. Other: None. IMPRESSION: 1. No acute intracranial abnormality. 2. Old bilateral cerebellar infarcts and findings of chronic small vessel disease. Electronically Signed   By: Deatra Robinson M.D.   On: 05/25/2023 01:12   DG Chest Portable 1 View  Result Date: 05/24/2023 CLINICAL DATA:  Chest pain  and burning, initial encounter EXAM: PORTABLE CHEST 1 VIEW COMPARISON:  07/09/2016 FINDINGS: Cardiac shadow is enlarged but stable. Aortic calcifications are noted. Loop recorder is seen. The lungs are well aerated bilaterally without focal infiltrate or effusion. Mild central vascular congestion is noted. No bony abnormality is noted. IMPRESSION: Mild central vascular congestion. Electronically Signed   By: Alcide Clever M.D.   On: 05/24/2023 23:10   CT HEAD WO CONTRAST ( )  Result Date: 05/24/2023 CLINICAL DATA:  Altered mental status EXAM: CT HEAD WITHOUT CONTRAST TECHNIQUE: Contiguous axial images were obtained from the base of the skull through the vertex without intravenous contrast. RADIATION DOSE REDUCTION: This exam was performed according to the departmental dose-optimization program which includes automated exposure control, adjustment of the mA and/or kV according to patient size and/or use of iterative reconstruction technique. COMPARISON:  06/27/2019 FINDINGS: Brain: No evidence of acute infarction, hemorrhage, hydrocephalus, extra-axial collection or mass lesion/mass effect. Chronic atrophic and ischemic changes are noted. Vascular: No hyperdense vessel or unexpected calcification. Skull: Normal. Negative for fracture or focal lesion. Sinuses/Orbits: No acute finding. Other: None. IMPRESSION: Chronic atrophic and ischemic changes stable from the prior exam. Electronically Signed   By: Alcide Clever M.D.   On: 05/24/2023 23:10      Assessment/Plan   Principal Problem:   NSTEMI (non-ST elevated myocardial infarction) (HCC) Active Problems:   Hyperlipidemia   Essential hypertension   TIA (transient ischemic attack)   DM2 (diabetes mellitus, type 2) (HCC)   Chronic hyponatremia   Paroxysmal atrial fibrillation (HCC)   Severe aortic stenosis   Hypomagnesemia   Chronic diastolic CHF (congestive heart failure) (HCC)   Moderate persistent asthma     #) NSTEMI: Diagnosis on the  basis of single episode of burning chest pain with troponin elevated to 1131, with repeat level now trending down, with EKG suggestive rate controlled atrial fibrillation without overt evidence of acute ischemic changes, including no evidence of ST elevation.  Patient is now chest pain-free.  This is  in the context of a known history of coronary artery disease status post MI as well as PCI with angioplasty in 1996.  EDP has discussed patient's case with on-call cardiology fellow, Dr. Elayne Guerin, who will formally consult. Per Dr. Lavell Anchors discussions with the patient, the patient does not want heart cath, rather wants to pursue medical management. Dr Elayne Guerin recommends continuation of outpatient Xarelto and does not recommend initiation of heparin drip along with echocardiogram.   Of note, the patient has received full dose aspirin x 1 in the ED.  Plan: Cardiology to formally consult.  Continue Xarelto, per cardiology recommendation.  Continue outpatient high intensity atorvastatin.  Continue outpatient beta-blocker.  Continue outpatient ACE inhibitor.  Echocardiogram ordered for the morning.  Monitor on telemetry.  Further evaluation and management of presenting hypomagnesemia, as further detailed below.  Repeat CMP in the morning.  Follow-up result of BNP.                 #) TIA: Presentation concerning for TIA on the basis of acute onset of aphasia, word finding difficulties at 1900 on 05/25/2023, which has subsequently resolved spontaneously, without any evidence of residual acute focal neurologic deficits at this time.  CT head as well as MRI brain showed no evidence of acute process, including no evidence of acute infarct.  CTA head and neck have been ordered, with results currently pending.  EDP has discussed patient's case with on-call neurology, Dr. Derry Lory, Who will formally consult.  The patient possesses multiple modifiable CVA risk factors including a history of  hypertension, hyperlipidemia, type 2 diabetes mellitus, paroxysmal atrial fibrillation chronically anticoagulated on Xarelto, and is also noted to be a former smoker.  Of note, he received full dose aspirin x 1 in the ED.   Not a candidate for TPA administration given that his symptoms spontaneously resolved.   Plan: Nursing bedside swallow evaluation x 1 now, and will not initiate oral medications or diet until the patient has passed this. Head of the bed at 30 degrees. Neuro checks per protocol. VS per protocol. Monitor on telemetry.  TTE without  bubble study has been ordered for the morning. Additionally, as component of evaluation of potential modifiable ischemic CVA risk factors, will also check lipid panel.  Of note, the patient just had hemoglobin A1c evaluation last month, with value of 6.4% noted when checked on 04/21/2023.  Consequently, will not repeat hemoglobin A1c level at this time.  PT/OT/ST consults have been ordered for the morning.  Neurology to consult.  Continue outpatient high intensity atorvastatin for now.  For now, continuing with outpatient Xarelto, will follow for neurology recommendations regarding any potential initiation of antiplatelet medications.  Follow-up result of CTA head and neck.                   #) Hypomagnesemia: presenting serum mag level noted to be 1.4.  Notable, particularly in the setting of presenting NSTEMI.  Plan: magnesium sulfate2  g IV over 2 hours. Monitor on tele. Repeat serum mag level in the AM.                   #) Hyperlipidemia: documented h/o such. On high intensity atorvastatin as outpatient.   Plan: continue home statin.  Follow for result of lipid panel, as above.                  #) Essential Hypertension: documented h/o such, with outpatient antihypertensive regimen including benazepril, HCTZ, metoprolol tartrate.  SBP's in the ED today: Most recently in the 120s to 140s mmHg.   Plan:  Close monitoring of subsequent BP via routine VS. continue outpatient beta-blocker and ACE inhibitor while holding home HCTZ.                   #) Type 2 Diabetes Mellitus: documented history of such. Home insulin regimen: None. Home oral hypoglycemic agents: Metformin. presenting blood sugar: 194. Most recent A1c noted to be 6.4% when checked on 04/21/2023.  Plan: accuchecks QAC and HS with low dose SSI. hold home oral hypoglycemic agents during this hospitalization.                  #) Chronic diastolic heart failure: documented history of such, with most recent echocardiogram performed July 2023, which is notable for LVEF 55 to 60%, grade 1 diastolic dysfunction, with additional results as conveyed above. home diuretic regimen reportedly consists of the following: None.  Presenting chest x-ray shows evidence of of mild pulmonary vascular congestion, no evidence of infiltrate, edema, or effusion.  Cardiology to formally consult, will make recommendations regarding whether or not to initiate diuresis efforts.  BNP result currently pending.   Plan: monitor strict I's & O's and daily weights. Repeat CMP in AM. Check serum mag level.  Will follow-up for recommendations via cardiology consultation, as above.  Follow-up result of BMP.                #) Paroxysmal atrial fibrillation: Documented history of such. In setting of CHA2DS2-VASc score of  8, there is an indication for chronic anticoagulation for thromboembolic prophylaxis. Consistent with this, patient is chronically anticoagulated on Xarelto. Home AV nodal blocking regimen: Metoprolol tartrate.  Most recent echocardiogram was performed in July 2023, with results as described above. Presenting EKG suggestive of rate controlled atrial fibrillation, as further detailed above.   Plan: monitor strict I's & O's and daily weights. CMP/CBC in AM.  IV magnesium supplementation as above, with repeat serum mag  level in the morning. Continue home AV nodal blocking regimen.  Continue outpatient Xarelto.  Monitor on telemetry.                   #) Severe aortic stenosis: Documented history of such, with most recent echocardiogram from July 2023 demonstrating aortic valve area calculated via continuity equation by VTI of 0.74 cm2, with documentation of suspicion for low-flow low gradient pathophysiology.  Plan: Will follow for result of cardiology consultation, as above.                   #) Moderate persistent asthma: documented history thereof, without clinical e/o to suggest current exacerbation. Outpatient respiratory regimen includes scheduled Advair, prn albuterol.   Plan: Check serum magnesium level. Continue home Advair.  Prn albuterol nebulizer.                  #) Chronic hypoosmolar hyponatremia: Documented history of such, with baseline serum sodium range noted to be 1 31-1 35 dating back to 2021, presenting serum sodium level found to be consistent with this baseline range.  Of note, TSH was found to be within normal limits when checked this evening.  Plan: Monitor strict I's and O's and daily weights.  CMP in the morning.        DVT prophylaxis: SCD's   Code Status: Full code Family Communication: none Disposition Plan: Per Rounding Team Consults called: EDP d/w on-call cardiology, Dr. Elayne Guerin, Who will  formally consult, as further detailed above; additionally, EDP is discussed patient's case with on-call neurology, Dr.Khaliqdina, Who also formally consult, as further detailed above.;  Admission status: Inpatient     I SPENT GREATER THAN 75  MINUTES IN CLINICAL CARE TIME/MEDICAL DECISION-MAKING IN COMPLETING THIS ADMISSION.      Chaney Born Tyneka Scafidi DO [facility]  From 7PM - 7AM   05/25/2023, 2:20 AM

## 2023-05-25 NOTE — Procedures (Signed)
Patient Name: [name]  MRN: [id number]  Epilepsy Attending: Charlsie Quest  Referring Physician/Provider: Margaretmary Dys, MD  Date: 05/24/2023 Duration: 23.24 mins  Patient history:  89 y.o. male with history of HTN, HLD, CAD, DM2, 3x previous strokes or TIAs (2x in 2015, 1x in 2021) afib on xarelto  presenting with a 20 minute episode of word-finding difficulty with complete resolution.   Level of alertness: Awake  AEDs during EEG study: None  Technical aspects: This EEG study was done with scalp electrodes positioned according to the 10-20 International system of electrode placement. Electrical activity was reviewed with band pass filter of 1-70Hz , sensitivity of 7 uV/mm, display speed of 78mm/sec with a 60Hz  notched filter applied as appropriate. EEG data were recorded continuously and digitally stored.  Video monitoring was available and reviewed as appropriate.  Description: The posterior dominant rhythm consists of 7 Hz activity of moderate voltage (25-35 uV) seen predominantly in posterior head regions, symmetric and reactive to eye opening and eye closing. EEG showed continuous generalized predominantly 5 to 6 Hz theta slowing admixed with intermittent 2-3Hz  delta slowing. Hyperventilation and photic stimulation were not performed.     ABNORMALITY - Continuous slow, generalized - Background slow  IMPRESSION: This study is suggestive of moderate diffuse encephalopathy. No seizures or epileptiform discharges were seen throughout the recording.  Serina Nichter Annabelle Harman

## 2023-05-25 NOTE — Progress Notes (Addendum)
Progress Note   [name]: [name] WUJ:[id number] DOB: 17-May-1930 DOA: 05/24/2023     0 DOS: the [name] was seen and examined on 05/25/2023   Brief hospital course: [name] was admitted to the hospital with the working diagnosis TIA in the setting of possible non ST elevation MI.   89 yo male with the past medical history of hypertension, hyperlipidemia, coronary artery disease, T2DM and atrial fibrillation who presented with aphasia.  [name] was found having difficulty finding words, lasting about 20 minutes. Positive precordial chest pain, burning in nature. On his initial physical examination his blood pressure was 138/73, HR 75, RR 20 and 02 saturation 99%, lungs with no wheezing or rales, heart with S1 and S2 present and regular, positive systolic murmur at the base, abdomen with no distention, no lower extremity edema, neurologically [name] was non focal.   Na 130, K 3,9 Cl 93 bicarbonate 23, glucose 194, bun 19 cr 1,32  Mg 1,4  AST 34, ALT 21.  High sensitive troponin 1,131 and 1,083  Wbc 9,2 hgb 13,0 plt 171  Urina analysis with SG 1,013, protein 30, negative for leukocytes and moderate hgb. >50 rbc, 6-10 wbc.   Chest radiograph with cardiomegaly with no infiltrates or effusions.   EKG 77 bpm, left axis deviation, right bundle branch block, qtc 479, atrial fibrillation rhythm with no significant ST segment changes, negative T wave lead III and AvF.   CT angiography of the head with positive for abnormal right vertebral artery, possible acute occlusion or slow retrograde flow.  Very severe stenosis of the right subclavian artery.  Extensive bulky calcified atherosclerosis of both proximal ICAs and both ICA siphons.   Brain MRI with no acute intracranial abnormalities. Old bilateral cerebellar infarcts and findings of chronic small vessel disease.   [name] was placed on heparin for anticoagulation.   Assessment and Plan: * TIA (transient ischemic attack) [name]  with recovered focal neurologic deficit. He has been transferred to the progressive care unit from the ED. Having agitation and confusion, difficult to re direct.  Acute metabolic encephalopathy with features of delirium.  Will order sitter one to one for safety. Add as needed haloperidol.  Continue neuro checks per unit protocol Follow up with further neurology recommendations.   Chronic diastolic CHF (congestive heart failure) (HCC) 2023 echocardiogram with preserved LV systolic function with EF 55 to 60%, mild concentric LVH, preserved RV systolic function, RVSP 44.2 mmHg, LA with moderate dilatation and RA with mild dilatation, severe aortic valve stenosis.  No clinical signs of acute exacerbation.  Plan to continue medical therapy with metoprolol and benazepril.  Follow up on new echocardiogram.  Considering [name]'s advanced age, encephalopathy and functional status, probably currently not candidate for invasive interventions.  Follow up with Cardiology recommendations.   NSTEMI (non-ST elevated myocardial infarction) (HCC) EKG, compared to old tracings apparently right bundle branch block is new.  T wave changes are not new.   High sensitive troponin is trending down.  [name] with high burden atherosclerosis in the brain, likely also coronary artery disease.   Continue with heparin IV for now. Follow up on echocardiogram for possible wall motion abnormalities.  Currently [name] is chest pain free. He ambulates with walker at home and apparently no angina symptoms.   Essential hypertension Continue blood pressure control with metoprolol and benazepril.   Paroxysmal atrial fibrillation (HCC) Continue rate control with metoprolol and current anticoagulation with IV heparin.   Chronic kidney disease, stage 3a (HCC) Hyponatremia, hypomagnesemia.  Renal function with serum cr at 1.42 with K at 3,6 and serum bicarbonate at 23. Na 129 and Mg 1,5   Hold on further  diuresis.  Add 4 g mag sulfate IV Follow up renal function and electrolytes in am.   Type 2 diabetes mellitus with hyperlipidemia (HCC) Continue glucose cover and monitoring with insulin sliding scale. Continue with statin therapy.         Subjective: [name] with no chest pain or dyspnea, positive confusion after transfer to progressive care unit   Physical Exam: Vitals:   05/25/23 1056 05/25/23 1414 05/25/23 1420 05/25/23 1517  BP: (!) 138/111 (!) 97/54  (!) 132/98  Pulse: 86 65    Resp: 20 18    Temp:  98 F (36.7 C)  97.6 F (36.4 C)  TempSrc:    Axillary  SpO2: 94% 98%    Weight:   90.7 kg   Height:   5\' 11"  (1.803 m) 5\' 11"  (1.803 m)   Neurology awake and alert.Marland Kitchen at the time of my examination he was confused and disorientated but able to follow commands.  ENT with mild pallor Cardiovascular with S1 and S2 present, irregularly irregular with positive systolic murmur at the base Respiratory with no rales or wheezing, no rhonchi Abdomen with no distention  Strength preserved bilaterally upper and lower extremities, mild left facial droop.  Data Reviewed:    Family Communication: I spoke with [name]'s daughter in law at the bedside, we talked in detail about [name]'s condition, plan of care and prognosis and all questions were addressed.   Disposition: Status is: Inpatient Remains inpatient appropriate because: work up TIA and troponin elevation   Planned Discharge Destination: Home     Author: Coralie Keens, MD 05/25/2023 5:06 PM  For on call review [email or web address].

## 2023-05-25 NOTE — Consult Note (Signed)
NEUROLOGY CONSULT NOTE   Date of service: May 25, 2023 Patient Name: [name] MRN:  [id number] DOB:  11/01/1929 Chief Complaint: "aphasia" Requesting Provider: Angie Fava, DO  History of Present Illness  [name] is a 89 y.o. male HTN, HLD, CAD, DM2, pafibb on xarelto, who presents with a 20 mins episode of word finding difficulty and spontaneous resolution. Daughter in law at bedside reports that this was around 1900 last night. He also reported reflx like symptoms over the last week and found to have elevated troponin and is being admitted for further evaluation and management of NSTEMI.  Speech deficit resolved spontaneously. He is on Xarelto and has not missed any doses.  LKW: 1900 on 05/24/23. Modified rankin score: 2-Slight disability-UNABLE to perform all activities but does not need assistance IV Thrombolysis: not offered, no deficit. EVT: not offered, no deficit.  NIHSS components Score: Comment  1a Level of Conscious 0[x]  1[]  2[]  3[]      1b LOC Questions 0[]  1[x]  2[]       1c LOC Commands 0[x]  1[]  2[]       2 Best Gaze 0[x]  1[]  2[]       3 Visual 0[x]  1[]  2[]  3[]      4 Facial Palsy 0[x]  1[]  2[]  3[]      5a Motor Arm - left 0[x]  1[]  2[]  3[]  4[]  UN[]    5b Motor Arm - Right 0[x]  1[]  2[]  3[]  4[]  UN[]    6a Motor Leg - Left 0[x]  1[]  2[]  3[]  4[]  UN[]    6b Motor Leg - Right 0[x]  1[]  2[]  3[]  4[]  UN[]    7 Limb Ataxia 0[x]  1[]  2[]  3[]  UN[]     8 Sensory 0[x]  1[]  2[]  UN[]      9 Best Language 0[x]  1[]  2[]  3[]      10 Dysarthria 0[x]  1[]  2[]  UN[]      11 Extinct. and Inattention 0[x]  1[]  2[]       TOTAL: 1      ROS  Comprehensive ROS performed and pertinent positives documented in HPI  Past History   Past Medical History:  Diagnosis Date   Arthritis    "wrists, knees, back" (12/10/2015)   Asthma    BCC (basal cell carcinoma of skin) 01/28/2017   left inner cheek--sup & nod (CX3, 5FU)   BCC (basal cell carcinoma) 01/26/2017   left chin (molts)   BCC (basal  cell carcinoma) 04/01/2017   midback--sup & nod (tx p bx)   BCC (basal cell carcinoma) 10/13/2017   above left outer eye brow--infiltrative (CX3, 5FU)   BCC (basal cell carcinoma) 06/02/2018   right post forearm--nod (tx p bx)   BCC (basal cell carcinoma) 03/09/2019   right mid outer cheek--nod--(tx p bx)   Cerebral atherosclerosis    Chronic back pain    "lower primarily" (12/10/2015)   CORONARY ATHEROSCLEROSIS NATIVE CORONARY ARTERY    Myocardial infarction at [facility] in 1996 with a directional atherectomy of the 90% LAD stenosis and medical management of 60-70% proximal circumflex and 50% RCA stenosis.   DYSLIPIDEMIA    GERD (gastroesophageal reflux disease)    History of hiatal hernia    HYPERTENSION    ILIAC ARTERY ANEURYSM    Myocardial infarction (HCC) 1996   SCC (squamous cell carcinoma) 10/29/2016   left ear--in situ (tx p bx)   SCC (squamous cell carcinoma) 01/26/2017   left sideburn--well diff--(CX3, 5FU)   SCC (squamous cell carcinoma) 01/28/2017   left upper cheek--in situ (CX3, 5FU)   SCC (squamous cell carcinoma)  05/06/2017   left cheek--sup &well diff (tx p bx)   SCC (squamous cell carcinoma) 10/13/2017   right outer forearm--in situ (CX3 5FU)   SCC (squamous cell carcinoma) 10/13/2017   right ouer forarm--well diff (CX3 5FU)   SCC (squamous cell carcinoma) 10/13/2017   left sideburn, superior--well diff (CX3, 5FU)   SCC (squamous cell carcinoma) 06/02/2018   right forearm--in situ (tx p bx)   SCC (squamous cell carcinoma) 03/09/2019   left zy goma--well diff (tx p bx)   SCC (squamous cell carcinoma) 03/09/2019   right pre auricular--well diff (tx p bx)   SCCA (squamous cell carcinoma) of skin 05/22/2021   Left Buccal Cheek (in situ)   Stroke (HCC) 2003; 2013; 07/2015; 12/05/2015   denies residual on 12/10/2015, 12/13/16, 01/08/17   TIA (transient ischemic attack)    Type II diabetes mellitus (HCC)    UPPER RESPIRATORY INFECTION     Past Surgical History:   Procedure Laterality Date   ANAL FISSURE REPAIR  1990s   BASAL CELL CARCINOMA EXCISION Right 12/2015   "temple"   CATARACT EXTRACTION W/ INTRAOCULAR LENS  IMPLANT, BILATERAL Bilateral 2000s   CORONARY ANGIOPLASTY  1996   KNEE CARTILAGE SURGERY Right 1970s   LOOP RECORDER INSERTION N/A 12/15/2016   Procedure: Loop Recorder Insertion;  Surgeon: Hillis Range, MD;  Location: [facility] INVASIVE CV LAB;  Service: Cardiovascular;  Laterality: N/A;   SHOULDER ARTHROSCOPY W/ ROTATOR CUFF REPAIR Left 2000s   TEE WITHOUT CARDIOVERSION N/A 12/15/2016   Procedure: TRANSESOPHAGEAL ECHOCARDIOGRAM (TEE);  Surgeon: Lewayne Bunting, MD;  Location: [facility] ENDOSCOPY;  Service: Cardiovascular;  Laterality: N/A;    Family History: Family History  Problem Relation Age of Onset   Coronary artery disease Mother    Cancer Father        lung   Stroke Father    Cancer Brother        lung   Stroke Sister     Social History  reports that he quit smoking about 38 years ago. His smoking use included cigarettes. He started smoking about 83 years ago. He has a 5.4 pack-year smoking history. He has quit using smokeless tobacco.  His smokeless tobacco use included snuff. He reports that he does not drink alcohol and does not use drugs.  Allergies  Allergen Reactions   Alteplase Anaphylaxis   Codeine Other (See Comments)    Bad dreams   Lopid [Gemfibrozil] Nausea Only   Voltaren [Diclofenac Sodium] Diarrhea    Medications   Current Facility-Administered Medications:    acetaminophen (TYLENOL) tablet 650 mg, 650 mg, Oral, Q6H PRN **OR** acetaminophen (TYLENOL) suppository 650 mg, 650 mg, Rectal, Q6H PRN, Howerter, Justin B, DO   albuterol (PROVENTIL) (2.5 MG/3ML) 0.083% nebulizer solution 2.5 mg, 2.5 mg, Nebulization, Q4H PRN, Howerter, Justin B, DO   atorvastatin (LIPITOR) tablet 80 mg, 80 mg, Oral, Daily, Howerter, Justin B, DO   benazepril (LOTENSIN) tablet 20 mg, 20 mg, Oral, Daily, Howerter, Justin B, DO    insulin aspart (novoLOG) injection 0-6 Units, 0-6 Units, Subcutaneous, TID WC, Howerter, Justin B, DO   magnesium sulfate IVPB 2 g 50 mL, 2 g, Intravenous, Once, Howerter, Justin B, DO, Last Rate: 50 mL/hr at 05/25/23 0320, 2 g at 05/25/23 0320   melatonin tablet 3 mg, 3 mg, Oral, QHS PRN, Howerter, Justin B, DO   metoprolol tartrate (LOPRESSOR) tablet 100 mg, 100 mg, Oral, BID, Howerter, Justin B, DO   mometasone-formoterol (DULERA) 100-5 MCG/ACT inhaler 2 puff, 2 puff,  Inhalation, BID, Howerter, Justin B, DO   ondansetron (ZOFRAN) injection 4 mg, 4 mg, Intravenous, Q6H PRN, Howerter, Justin B, DO   pantoprazole (PROTONIX) EC tablet 40 mg, 40 mg, Oral, Daily, Howerter, Justin B, DO   rivaroxaban (XARELTO) tablet 20 mg, 20 mg, Oral, Q supper, Howerter, Justin B, DO  Current Outpatient Medications:    Accu-Chek FastClix Lancets MISC, Check BS up to 4 TIMES DAILY Dx E11.9, Disp: 408 each, Rfl: 3   acetaminophen (TYLENOL) 325 MG tablet, Take 650 mg by mouth every 6 (six) hours as needed for mild pain, fever or headache., Disp: , Rfl:    ADVAIR DISKUS 100-50 MCG/ACT AEPB, INHALE 1 PUFF INTO LUNGS EVERY 12 HOURS, Disp: 180 each, Rfl: 0   albuterol (VENTOLIN HFA) 108 (90 Base) MCG/ACT inhaler, INHALE 1 TO 2 PUFFS BY MOUTH EVERY 6 HOURS AS NEEDED FOR WHEEZING OR SHORTNESS OF BREATH, Disp: 27 g, Rfl: 0   atorvastatin (LIPITOR) 80 MG tablet, Take 1 tablet (80 mg total) by mouth daily., Disp: 90 tablet, Rfl: 3   benazepril (LOTENSIN) 20 MG tablet, Take 1 tablet (20 mg total) by mouth daily., Disp: 90 tablet, Rfl: 3   blood glucose meter kit and supplies KIT, Dispense based on patient and insurance preference. Use four times daily as directed.  Diagnosis type 2 diabetes, Disp: 1 each, Rfl: 0   Cholecalciferol (VITAMIN D) 2000 units CAPS, Take 2,000 Units by mouth daily., Disp: , Rfl:    feeding supplement, GLUCERNA SHAKE, (GLUCERNA SHAKE) LIQD, Take 237 mLs by mouth 2 (two) times daily between meals., Disp:  60 Can, Rfl: 11   fish oil-omega-3 fatty acids 1000 MG capsule, Take 2 g by mouth every evening., Disp: , Rfl:    glucose blood (ACCU-CHEK GUIDE) test strip, USE 1 STRIP TO CHECK GLUCOSE UP TO 4 TIMES DAILY, Disp: 300 each, Rfl: 0   hydrALAZINE (APRESOLINE) 25 MG tablet, Take 1 tablet (25 mg total) by mouth daily as needed., Disp: 90 tablet, Rfl: 3   hydrochlorothiazide (HYDRODIURIL) 25 MG tablet, Take 1 tablet by mouth once daily, Disp: 90 tablet, Rfl: 0   metFORMIN (GLUCOPHAGE-XR) 500 MG 24 hr tablet, Take 1 tablet (500 mg total) by mouth daily with breakfast., Disp: 90 tablet, Rfl: 3   metoprolol tartrate (LOPRESSOR) 100 MG tablet, Take 1 tablet (100 mg total) by mouth 2 (two) times daily., Disp: 180 tablet, Rfl: 3   nitroGLYCERIN (NITROSTAT) 0.4 MG SL tablet, USE AS DIRECTED, Disp: 25 tablet, Rfl: 0   pantoprazole (PROTONIX) 40 MG tablet, Take 1 tablet (40 mg total) by mouth daily., Disp: 90 tablet, Rfl: 3   rivaroxaban (XARELTO) 20 MG TABS tablet, Take 1 tablet (20 mg total) by mouth daily with supper., Disp: 90 tablet, Rfl: 3  Vitals   Vitals:   05/25/23 0030 05/25/23 0133 05/25/23 0145 05/25/23 0235  BP: 122/62  103/62 (!) 128/108  Pulse: 76  99 92  Resp: 18  18 18   Temp:  98 F (36.7 C)    TempSrc:  Oral    SpO2: 99%  96% 97%    There is no height or weight on file to calculate BMI.  Physical Exam   Constitutional: Appears well-developed and well-nourished.  Psych: Affect appropriate to situation.  Eyes: No scleral injection.  HENT: No OP obstruction.  Head: Normocephalic.  Cardiovascular: Normal rate and regular rhythm.  Respiratory: Effort normal, non-labored breathing.  GI: Soft.  No distension. There is no tenderness.  Skin: WDI.  Neurologic Examination  Mental status/Cognition: Alert, oriented to self, place, but not to month and year, good attention.  Speech/language: Fluent, comprehension intact, object naming intact, repetition intact.  Cranial nerves:   CN  II Pupils equal and reactive to light, no VF deficits    CN III,IV,VI EOM intact, no gaze preference or deviation, no nystagmus    CN V normal sensation in V1, V2, and V3 segments bilaterally    CN VII no asymmetry, no nasolabial fold flattening    CN VIII Hard of hearing BL   CN IX & X normal palatal elevation, no uvular deviation    CN XI 5/5 head turn and 5/5 shoulder shrug bilaterally    CN XII midline tongue protrusion    Motor:  Muscle bulk: normal, tone normal, pronator drift none tremor none Mvmt Root Nerve  Muscle Right Left Comments  SA C5/6 Ax Deltoid 5 5   EF C5/6 Mc Biceps 5 5   EE C6/7/8 Rad Triceps 5 5   WF C6/7 Med FCR     WE C7/8 PIN ECU     F Ab C8/T1 U ADM/FDI 5 5   HF L1/2/3 Fem Illopsoas 5 5   KE L2/3/4 Fem Quad 5 5   DF L4/5 D Peron Tib Ant 5 5   PF S1/2 Tibial Grc/Sol 5 5    Sensation:  Light touch Intact throughout   Pin prick    Temperature    Vibration   Proprioception    Coordination/Complex Motor:  - Finger to Nose intact BL - Heel to shin intact BL - Rapid alternating movement are normal - Gait: deferred.   Labs   CBC:  Recent Labs  Lab 05/24/23 2126  WBC 9.2  NEUTROABS 6.8  HGB 13.0  HCT 36.8*  MCV 96.3  PLT 172    Basic Metabolic Panel:  Lab Results  Component Value Date   NA 130 (L) 05/24/2023   K 3.9 05/24/2023   CO2 23 05/24/2023   GLUCOSE 194 (H) 05/24/2023   BUN 19 05/24/2023   CREATININE 1.32 (H) 05/24/2023   CALCIUM 8.9 05/24/2023   GFRNONAA 51 (L) 05/24/2023   GFRAA 63 09/11/2020   Lipid Panel:  Lab Results  Component Value Date   LDLCALC 33 04/21/2023   HgbA1c:  Lab Results  Component Value Date   HGBA1C 6.4 (H) 04/21/2023   Urine Drug Screen:     Component Value Date/Time   LABOPIA NONE DETECTED 02/18/2020 1733   COCAINSCRNUR NONE DETECTED 02/18/2020 1733   LABBENZ NONE DETECTED 02/18/2020 1733   AMPHETMU NONE DETECTED 02/18/2020 1733   THCU NONE DETECTED 02/18/2020 1733   LABBARB NONE DETECTED  02/18/2020 1733    Alcohol Level     Component Value Date/Time   ETH <10 02/18/2020 1651   INR  Lab Results  Component Value Date   INR 1.3 (H) 02/18/2020   APTT  Lab Results  Component Value Date   APTT 32 02/18/2020   AED levels: No results found for: "PHENYTOIN", "ZONISAMIDE", "LAMOTRIGINE", "LEVETIRACETA"   CT Head without contrast(Personally reviewed): CTH was negative for a large hypodensity concerning for a large territory infarct or hyperdensity concerning for an ICH  CT angio Head and Neck with contrast: pending  MRI Brain(Personally reviewed): No acute stroke  Impression   [name] is a 89 y.o. male with hx of HTN, HLD, CAD, DM2, pafibb on xarelto, who presents with a 20 mins episode of word finding difficulty that spontaneously  resolved. Also reporting reflux symptoms and found to have NSTEMI.  Episode earlier is concerning for potential TIA. However, he is on maximal medical management with Xarelto. Will get CTA to evaluate for atherosclerotic disease which may require intervention.  Recommendations  Recommend that primary team order following: - Frequent Neuro checks per stroke unit protocol - Recommend Vascular imaging with CTA head and neck - no need to repaet TTE from a neuro standpoint. - Recommend obtaining Lipid panel with LDL - Please start statin if LDL > 70 - Recommend HbA1c to evaluate for diabetes and how well it is controlled. - continue home Xarelto specially with negative MRI Brain. - SBP goal - permissive hypertension first 24 h < 220/110. Held home meds.  - Recommend Telemetry monitoring for arrythmia - Recommend bedside swallow screen prior to PO intake. - Stroke education booklet - Recommend PT/OT/SLP consult  ______________________________________________________________________    Welton Flakes

## 2023-05-25 NOTE — Progress Notes (Signed)
Carotid artery duplex has been completed. Preliminary results can be found in CV Proc through chart review.   05/25/23 3:48 PM Olen Cordial RVT

## 2023-05-25 NOTE — ED Notes (Signed)
Hospitalist at bedside discussing plan of care with pt and pt's daughter.

## 2023-05-25 NOTE — Hospital Course (Addendum)
[name] was admitted to the hospital with the working diagnosis TIA in the setting of possible non ST elevation MI.   89 yo male with the past medical history of hypertension, hyperlipidemia, coronary artery disease, T2DM and atrial fibrillation who presented with aphasia.  Patient was found having difficulty finding words, lasting about 20 minutes. Positive precordial chest pain, burning in nature. On his initial physical examination his blood pressure was 138/73, HR 75, RR 20 and 02 saturation 99%, lungs with no wheezing or rales, heart with S1 and S2 present and regular, positive systolic murmur at the base, abdomen with no distention, no lower extremity edema, neurologically patient was non focal.   Na 130, K 3,9 Cl 93 bicarbonate 23, glucose 194, bun 19 cr 1,32  Mg 1,4  AST 34, ALT 21.  High sensitive troponin 1,131 and 1,083  Wbc 9,2 hgb 13,0 plt 171  Urina analysis with SG 1,013, protein 30, negative for leukocytes and moderate hgb. >50 rbc, 6-10 wbc.   Chest radiograph with cardiomegaly with no infiltrates or effusions.   EKG 77 bpm, left axis deviation, right bundle branch block, qtc 479, atrial fibrillation rhythm with no significant ST segment changes, negative T wave lead III and AvF.   CT angiography of the head with positive for abnormal right vertebral artery, possible acute occlusion or slow retrograde flow.  Very severe stenosis of the right subclavian artery.  Extensive bulky calcified atherosclerosis of both proximal ICAs and both ICA siphons.   Brain MRI with no acute intracranial abnormalities. Old bilateral cerebellar infarcts and findings of chronic small vessel disease.   Patient was placed on heparin for anticoagulation.

## 2023-05-25 NOTE — Assessment & Plan Note (Signed)
2023 echocardiogram with preserved LV systolic function with EF 55 to 60%, mild concentric LVH, preserved RV systolic function, RVSP 44.2 mmHg, LA with moderate dilatation and RA with mild dilatation, severe aortic valve stenosis.  No clinical signs of acute exacerbation.  Plan to continue medical therapy with metoprolol and benazepril.  Follow up on new echocardiogram.  Considering patient's advanced age, encephalopathy and functional status, probably currently not candidate for invasive interventions.  Follow up with Cardiology recommendations.

## 2023-05-25 NOTE — Progress Notes (Signed)
EEG complete - results pending 

## 2023-05-25 NOTE — Assessment & Plan Note (Signed)
Continue glucose cover and monitoring with insulin sliding scale.  Continue with statin therapy.

## 2023-05-25 NOTE — Progress Notes (Signed)
PHARMACY - ANTICOAGULATION CONSULT NOTE  Pharmacy Consult for heparin Indication: Afib/ACS  Allergies  Allergen Reactions   Alteplase Anaphylaxis   Codeine Other (See Comments)    Bad dreams   Diclofenac Sodium Diarrhea   Gemfibrozil Nausea Only    Other Reaction(s): GI Intolerance   Voltaren [Diclofenac Sodium] Diarrhea    Patient Measurements:   Heparin Dosing Weight: TBW  Vital Signs: Temp: 97.9 F (36.6 C) (10/22 0604) Temp Source: Oral (10/22 0604) BP: 113/84 (10/22 0743) Pulse Rate: 86 (10/22 0743)  Labs: Recent Labs    05/24/23 2126 05/24/23 2345 05/25/23 0316  HGB 13.0  --  11.9*  HCT 36.8*  --  34.0*  PLT 172  --  152  CREATININE 1.32*  --  1.42*  TROPONINIHS 1,131* 1,083*  --     CrCl cannot be calculated (Unknown ideal weight.).   Medical History: Past Medical History:  Diagnosis Date   Arthritis    "wrists, knees, back" (12/10/2015)   Asthma    BCC (basal cell carcinoma of skin) 01/28/2017   left inner cheek--sup & nod (CX3, 5FU)   BCC (basal cell carcinoma) 01/26/2017   left chin (molts)   BCC (basal cell carcinoma) 04/01/2017   midback--sup & nod (tx p bx)   BCC (basal cell carcinoma) 10/13/2017   above left outer eye brow--infiltrative (CX3, 5FU)   BCC (basal cell carcinoma) 06/02/2018   right post forearm--nod (tx p bx)   BCC (basal cell carcinoma) 03/09/2019   right mid outer cheek--nod--(tx p bx)   Cerebral atherosclerosis    Chronic back pain    "lower primarily" (12/10/2015)   CORONARY ATHEROSCLEROSIS NATIVE CORONARY ARTERY    Myocardial infarction at [facility] in 1996 with a directional atherectomy of the 90% LAD stenosis and medical management of 60-70% proximal circumflex and 50% RCA stenosis.   DYSLIPIDEMIA    GERD (gastroesophageal reflux disease)    History of hiatal hernia    HYPERTENSION    ILIAC ARTERY ANEURYSM    Myocardial infarction (HCC) 1996   SCC (squamous cell carcinoma) 10/29/2016   left ear--in situ (tx p bx)    SCC (squamous cell carcinoma) 01/26/2017   left sideburn--well diff--(CX3, 5FU)   SCC (squamous cell carcinoma) 01/28/2017   left upper cheek--in situ (CX3, 5FU)   SCC (squamous cell carcinoma) 05/06/2017   left cheek--sup &well diff (tx p bx)   SCC (squamous cell carcinoma) 10/13/2017   right outer forearm--in situ (CX3 5FU)   SCC (squamous cell carcinoma) 10/13/2017   right ouer forarm--well diff (CX3 5FU)   SCC (squamous cell carcinoma) 10/13/2017   left sideburn, superior--well diff (CX3, 5FU)   SCC (squamous cell carcinoma) 06/02/2018   right forearm--in situ (tx p bx)   SCC (squamous cell carcinoma) 03/09/2019   left zy goma--well diff (tx p bx)   SCC (squamous cell carcinoma) 03/09/2019   right pre auricular--well diff (tx p bx)   SCCA (squamous cell carcinoma) of skin 05/22/2021   Left Buccal Cheek (in situ)   Stroke (HCC) 2003; 2013; 07/2015; 12/05/2015   denies residual on 12/10/2015, 12/13/16, 01/08/17   TIA (transient ischemic attack)    Type II diabetes mellitus (HCC)    UPPER RESPIRATORY INFECTION     Assessment: 5 YOM presenting with SOB, hx of afib on Xarelto PTA with last dose 10/21 @1800 , on hold for possible procedure.    Goal of Therapy:  Heparin level 0.3-0.7 units/ml aPTT 66-102 seconds Monitor platelets by anticoagulation protocol: Yes  Plan:  Heparin gtt at 1200 units/hr, no bolus F/u 8 hour aPTT/HL F/u cards plan  Daylene Posey, PharmD, [facility] Clinical Pharmacist ED Pharmacist Phone # [phone] 05/25/2023 9:55 AM

## 2023-05-25 NOTE — Evaluation (Signed)
Physical Therapy Evaluation Patient Details Name: [name] MRN: [id number] DOB: 06-May-1930 Today's Date: 05/25/2023  History of Present Illness  Patient is a 89 year old male with episode of word finding difficulty that resolved, elevated troponin being admitted for evaluation of NSTEMI.  Clinical Impression  Patient is agreeable to PT evaluation. Supportive family member at the beside. Patient is modified independent at baseline and ambulates with a cane at home with no recent falls reported. He has assistance for showers. His spouse has a caregiver at home.  Today, the patient performed bed mobility and transfers with CGA. Patient ambulated in hallway with rolling walker with occasional Min A for steadying and for assistance with turns. Patient is limited by fatigue with no significant dyspnea with ambulation. Encouraged patient to continue using rolling walker for safety for fall prevention. He is eager to go home soon. Recommend to continue PT to maximize independence and decrease caregiver burden.       If plan is discharge home, recommend the following: A little help with walking and/or transfers;A little help with bathing/dressing/bathroom;Help with stairs or ramp for entrance;Assist for transportation;Assistance with cooking/housework;Direct supervision/assist for financial management;Direct supervision/assist for medications management   Can travel by private vehicle        Equipment Recommendations None recommended by PT  Recommendations for Other Services       Functional Status Assessment Patient has had a recent decline in their functional status and demonstrates the ability to make significant improvements in function in a reasonable and predictable amount of time.     Precautions / Restrictions Precautions Precautions: Fall Precaution Comments: OK to mobilize within limit of symptoms (dyspnea or "indigestion") per cardiology Restrictions Weight Bearing  Restrictions: No      Mobility  Bed Mobility Overal bed mobility: Needs Assistance Bed Mobility: Supine to Sit, Sit to Supine     Supine to sit: Contact guard Sit to supine: Contact guard assist   General bed mobility comments: increased time required    Transfers Overall transfer level: Needs assistance Equipment used: Rolling walker (2 wheels) Transfers: Sit to/from Stand Sit to Stand: Contact guard assist           General transfer comment: cues for safety, task initiation.    Ambulation/Gait Ambulation/Gait assistance: Min assist, Contact guard assist Gait Distance (Feet): 75 Feet Assistive device: Rolling walker (2 wheels) Gait Pattern/deviations: Wide base of support, Step-through pattern Gait velocity: decreased     General Gait Details: patient ambulated in hallway with rolling walker with occasional Min A for steadying and negotiation of rolling walker with turns. patient is fatigued with activity with no significant dyspnea noted. cues to bring rolling walker closer to base of support  Stairs            Wheelchair Mobility     Tilt Bed    Modified Rankin (Stroke Patients Only)       Balance Overall balance assessment: Needs assistance Sitting-balance support: Feet supported, No upper extremity supported Sitting balance-Leahy Scale: Fair     Standing balance support: Bilateral upper extremity supported Standing balance-Leahy Scale: Poor Standing balance comment: relying on rolling walker for support in standing                             Pertinent Vitals/Pain Pain Assessment Pain Assessment: No/denies pain    Home Living Family/patient expects to be discharged to:: Private residence Living Arrangements: Spouse/significant other Available Help at  Discharge: Family;Available PRN/intermittently;Personal care attendant Type of Home: House Home Access: Stairs to enter   Entrance Stairs-Number of Steps: 3   Home Layout:  One level Home Equipment: Agricultural consultant (2 wheels);Cane - single point Additional Comments: spouse requires a caregiver that comes in 3 days per week. the cargiver also helps with cooking, cleaning, and PRN with assisting the patient    Prior Function Prior Level of Function : Independent/Modified Independent             Mobility Comments: using cane for ambulation. ADLs Comments: has assistance for showering     Extremity/Trunk Assessment   Upper Extremity Assessment Upper Extremity Assessment: Defer to OT evaluation    Lower Extremity Assessment Lower Extremity Assessment: Generalized weakness       Communication   Communication Communication: Hearing impairment Cueing Techniques: Verbal cues;Visual cues  Cognition Arousal: Alert Behavior During Therapy: WFL for tasks assessed/performed Overall Cognitive Status: Impaired/Different from baseline Area of Impairment: Following commands, Safety/judgement, Problem solving                       Following Commands: Follows one step commands with increased time Safety/Judgement: Decreased awareness of deficits, Decreased awareness of safety   Problem Solving: Slow processing, Requires verbal cues, Requires tactile cues General Comments: family member in the room report the patient is "90%" better mentally than yesterday but still has some confusion compared to baseline.        General Comments General comments (skin integrity, edema, etc.): encouraged patient to use rolling walker for ambulation at home instead of the cane for increased support for fall prevention    Exercises     Assessment/Plan    PT Assessment Patient needs continued PT services  PT Problem List Decreased strength;Decreased range of motion;Decreased activity tolerance;Decreased balance;Decreased mobility;Decreased safety awareness;Decreased cognition       PT Treatment Interventions DME instruction;Gait training;Stair  training;Functional mobility training;Therapeutic activities;Therapeutic exercise;Balance training;Neuromuscular re-education;Cognitive remediation;Patient/family education    PT Goals (Current goals can be found in the Care Plan section)  Acute Rehab PT Goals Patient Stated Goal: to go home as soon as possible PT Goal Formulation: With patient/family Time For Goal Achievement: 06/01/23 Potential to Achieve Goals: Good    Frequency Min 1X/week     Co-evaluation PT/OT/SLP Co-Evaluation/Treatment: Yes Reason for Co-Treatment: Complexity of the patient's impairments (multi-system involvement) PT goals addressed during session: Mobility/safety with mobility         AM-PAC PT "6 Clicks" Mobility  Outcome Measure Help needed turning from your back to your side while in a flat bed without using bedrails?: None Help needed moving from lying on your back to sitting on the side of a flat bed without using bedrails?: A Little Help needed moving to and from a bed to a chair (including a wheelchair)?: A Little Help needed standing up from a chair using your arms (e.g., wheelchair or bedside chair)?: A Little Help needed to walk in hospital room?: A Little Help needed climbing 3-5 steps with a railing? : A Little 6 Click Score: 19    End of Session   Activity Tolerance: Patient tolerated treatment well Patient left: in bed;with call bell/phone within reach;with family/visitor present Nurse Communication: Mobility status PT Visit Diagnosis: Unsteadiness on feet (R26.81);Muscle weakness (generalized) (M62.81)    Time: 4696-2952 PT Time Calculation (min) (ACUTE ONLY): 17 min   Charges:   PT Evaluation $PT Eval Low Complexity: 1 Low   PT General Charges $$ ACUTE  PT VISIT: 1 Visit         Donna Bernard, PT, MPT   Ina Homes 05/25/2023, 12:48 PM

## 2023-05-25 NOTE — Assessment & Plan Note (Signed)
EKG, compared to old tracings apparently right bundle branch block is new.  T wave changes are not new.   High sensitive troponin is trending down.  Patient with high burden atherosclerosis in the brain, likely also coronary artery disease.   Continue with heparin IV for now. Follow up on echocardiogram for possible wall motion abnormalities.  Currently patient is chest pain free. He ambulates with walker at home and apparently no angina symptoms.

## 2023-05-26 ENCOUNTER — Inpatient Hospital Stay (HOSPITAL_COMMUNITY): Payer: Medicare Other

## 2023-05-26 DIAGNOSIS — R079 Chest pain, unspecified: Secondary | ICD-10-CM | POA: Diagnosis not present

## 2023-05-26 DIAGNOSIS — I10 Essential (primary) hypertension: Secondary | ICD-10-CM | POA: Diagnosis not present

## 2023-05-26 DIAGNOSIS — I5032 Chronic diastolic (congestive) heart failure: Secondary | ICD-10-CM | POA: Diagnosis not present

## 2023-05-26 DIAGNOSIS — G459 Transient cerebral ischemic attack, unspecified: Secondary | ICD-10-CM | POA: Diagnosis not present

## 2023-05-26 DIAGNOSIS — I214 Non-ST elevation (NSTEMI) myocardial infarction: Secondary | ICD-10-CM | POA: Diagnosis not present

## 2023-05-26 LAB — APTT
aPTT: 32 s (ref 24–36)
aPTT: 130 s — ABNORMAL HIGH (ref 24–36)
aPTT: >200 s (ref 24–36)

## 2023-05-26 LAB — GLUCOSE, CAPILLARY
Glucose-Capillary: 162 mg/dL — ABNORMAL HIGH (ref 70–99)
Glucose-Capillary: 192 mg/dL — ABNORMAL HIGH (ref 70–99)
Glucose-Capillary: 182 mg/dL — ABNORMAL HIGH (ref 70–99)
Glucose-Capillary: 159 mg/dL — ABNORMAL HIGH (ref 70–99)

## 2023-05-26 LAB — BASIC METABOLIC PANEL
Anion gap: 10 (ref 5–15)
BUN: 21 mg/dL (ref 8–23)
CO2: 25 mmol/L (ref 22–32)
Calcium: 8.7 mg/dL — ABNORMAL LOW (ref 8.9–10.3)
Chloride: 94 mmol/L — ABNORMAL LOW (ref 98–111)
Creatinine, Ser: 1.42 mg/dL — ABNORMAL HIGH (ref 0.61–1.24)
GFR, Estimated: 47 mL/min — ABNORMAL LOW (ref >60)
Glucose, Bld: 147 mg/dL — ABNORMAL HIGH (ref 70–99)
Potassium: 3.7 mmol/L (ref 3.5–5.1)
Sodium: 129 mmol/L — ABNORMAL LOW (ref 135–145)

## 2023-05-26 LAB — ECHOCARDIOGRAM COMPLETE
AR max vel: 1.7 cm2
AV Area VTI: 1.38 cm2
AV Area mean vel: 1.6 cm2
AV Mean grad: 32 mm[Hg]
AV Peak grad: 53.1 mm[Hg]
Ao pk vel: 3.65 m/s
Area-P 1/2: 3.77 cm2
Calc EF: 61.1 %
Height: 71 in
MV VTI: 4.23 cm2
S' Lateral: 2.95 cm
Single Plane A2C EF: 66.8 %
Single Plane A4C EF: 55.5 %
Weight: 2934.76 [oz_av]

## 2023-05-26 LAB — VITAMIN B12: Vitamin B-12: 269 pg/mL (ref 180–914)

## 2023-05-26 LAB — CBC
HCT: 35.2 % — ABNORMAL LOW (ref 39.0–52.0)
HCT: 37.1 % — ABNORMAL LOW (ref 39.0–52.0)
Hemoglobin: 12.5 g/dL — ABNORMAL LOW (ref 13.0–17.0)
Hemoglobin: 12.9 g/dL — ABNORMAL LOW (ref 13.0–17.0)
MCH: 34.2 pg — ABNORMAL HIGH (ref 26.0–34.0)
MCH: 33.2 pg (ref 26.0–34.0)
MCHC: 35.5 g/dL (ref 30.0–36.0)
MCHC: 34.8 g/dL (ref 30.0–36.0)
MCV: 96.4 fL (ref 80.0–100.0)
MCV: 95.4 fL (ref 80.0–100.0)
Platelets: 167 10*3/uL (ref 150–400)
Platelets: 160 10*3/uL (ref 150–400)
RBC: 3.65 MIL/uL — ABNORMAL LOW (ref 4.22–5.81)
RBC: 3.89 MIL/uL — ABNORMAL LOW (ref 4.22–5.81)
RDW: 12.6 % (ref 11.5–15.5)
RDW: 12.6 % (ref 11.5–15.5)
WBC: 7.2 10*3/uL (ref 4.0–10.5)
WBC: 6.9 10*3/uL (ref 4.0–10.5)
nRBC: 0 % (ref 0.0–0.2)
nRBC: 0 % (ref 0.0–0.2)

## 2023-05-26 LAB — HIV ANTIBODY (ROUTINE TESTING W REFLEX): HIV Screen 4th Generation wRfx: NONREACTIVE

## 2023-05-26 LAB — MAGNESIUM: Magnesium: 2.1 mg/dL (ref 1.7–2.4)

## 2023-05-26 LAB — SEDIMENTATION RATE: Sed Rate: 5 mm/h (ref 0–16)

## 2023-05-26 LAB — OSMOLALITY: Osmolality: 294 mosm/kg (ref 275–295)

## 2023-05-26 LAB — FOLATE: Folate: 21.1 ng/mL (ref >5.9)

## 2023-05-26 LAB — OSMOLALITY, URINE: Osmolality, Ur: 335 mosm/kg (ref 300–900)

## 2023-05-26 LAB — HEPARIN LEVEL (UNFRACTIONATED): Heparin Unfractionated: >1.1 [IU]/mL — ABNORMAL HIGH (ref 0.30–0.70)

## 2023-05-26 LAB — TSH: TSH: 2.226 u[IU]/mL (ref 0.350–4.500)

## 2023-05-26 MED ORDER — HEPARIN (PORCINE) 25000 UT/250ML-% IV SOLN
1350.0000 [IU]/h | INTRAVENOUS | Status: DC
  Administered 2023-05-26: 1350 [IU]/h via INTRAVENOUS

## 2023-05-26 MED ORDER — HEPARIN (PORCINE) 25000 UT/250ML-% IV SOLN
1000.0000 [IU]/h | INTRAVENOUS | Status: DC
  Filled 2023-05-26: qty 250

## 2023-05-26 NOTE — Progress Notes (Signed)
Patient in room with sitter at bedside. Sitter called nurse to room for assist. Patient was trying to get out of bed and was to be point where he could no longer be easily redirected. Haldol prn given.

## 2023-05-26 NOTE — Progress Notes (Signed)
Patient pulled out his peripheral IV while trying to go to the bathroom. Heparin infusion paused for 30 minutes to get new IV access. Patient given PO haldol.

## 2023-05-26 NOTE — Progress Notes (Signed)
   Patient Name: JAJAUN LACKI Date of Encounter: 05/26/2023 [facility] Cardiologist: Rollene Rotunda, MD   Interval Summary  .    Restless overnight, some visual hallucinations, sounds like sundowning most likely.  Received lorazepam. No dyspnea or chest pain.  Lying completely supine in bed without any respiratory difficulty.  Vital Signs .    Vitals:   05/25/23 2032 05/26/23 0500 05/26/23 0552 05/26/23 0700  BP:  138/77    Pulse:      Resp:      Temp:  97.6 F (36.4 C)  98 F (36.7 C)  TempSrc:  Oral  Oral  SpO2: 95% 97%    Weight:   83.2 kg   Height:        Intake/Output Summary (Last 24 hours) at 05/26/2023 1610 Last data filed at 05/25/2023 2048 Gross per 24 hour  Intake 536.21 ml  Output 1125 ml  Net -588.79 ml      05/26/2023    5:52 AM 05/25/2023    2:20 PM 04/21/2023   10:47 AM  Last 3 Weights  Weight (lbs) 183 lb 6.8 oz 200 lb 188 lb 3.2 oz  Weight (kg) 83.2 kg 90.719 kg 85.367 kg      Telemetry/ECG    Sinus rhythm, rate related ventricular conduction abnormalities, occasional PVCs- Personally Reviewed  Physical Exam .   GEN: No acute distress.   Neck: No JVD Cardiac: RRR, normal S1, absent S2, late peaking 3/6 aortic ejection murmur, no diastolic murmurs, rubs, or gallops.  Respiratory: Clear to auscultation bilaterally. GI: Soft, nontender, non-distended  MS: No edema  Assessment & Plan .     Mr. Robin is currently sedated and asleep.  Spoke with his eldest son Apolonio. Echocardiogram still pending. Renal function stable, persistent hyponatremia. Hyponatremia may be contributory to his neurological problems.  Currently not receiving diuretics. Before he became disoriented and confused yesterday evening the patient and his son had a conversation regarding further care.  Jony tells me that he made it very clear that he does not want any additional invasive evaluation or treatment.  He does not want to pursue TAVR.  This is a  reasonable decision at age 89, I encourage engaging our palliative care team.    For questions or updates, please contact [facility] Please consult [email or web address] for contact info under        Signed, Thurmon Fair, MD

## 2023-05-26 NOTE — Progress Notes (Signed)
PHARMACY - ANTICOAGULATION CONSULT NOTE  Pharmacy Consult for heparin Indication: atrial fibrillation  Labs: Recent Labs    05/24/23 2126 05/24/23 2345 05/25/23 0316 05/26/23 0216 05/26/23 1137 05/26/23 1547 05/26/23 2235  HGB 13.0  --  11.9* 12.5*  --  12.9*  --   HCT 36.8*  --  34.0* 35.2*  --  37.1*  --   PLT 172  --  152 167  --  160  --   APTT  --   --   --  32 130*  --  >200*  HEPARINUNFRC  --   --   --  >1.10*  --   --   --   CREATININE 1.32*  --  1.42* 1.42*  --   --   --   TROPONINIHS 1,131* 1,083*  --   --   --   --   --    Assessment: 89yo male supratherapeutic on heparin with higher PTT despite decreased rate; no infusion issues per RN though she notes pt was continuously oozing after lab draw.  Goal of Therapy:  aPTT 66-102 seconds   Plan:  Hold heparin infusion x1h then decrease rate to 1000 units/hr. Check level in 8 hours.   Vernard Gambles, PharmD, BCPS 05/26/2023 11:56 PM

## 2023-05-26 NOTE — Progress Notes (Signed)
[name] MRN: [id number] Epilepsy Attending: Charlsie Quest Referring Physician/Provider: Margaretmary Dys, MD Date: 05/24/2023 Duration: 23.24 mins Patient history:  89 y.o. male with history of HTN, HLD, CAD, DM2, 3x previous strokes or TIAs (2x in 2015, 1x in 2021) afib on xarelto  presenting with a 20 minute episode of word-finding difficulty with complete resolution. Level of alertness: Awake AEDs during EEG study: None Technical aspects: This EEG study was done with scalp electrodes positioned according to the 10-20 International system of electrode placement. Electrical activity was reviewed with band pass filter of 1-70Hz , sensitivity of 7 uV/mm, display speed of 40mm/sec with a 60Hz  notched filter applied as appropriate. EEG data were recorded continuously and digitally stored.  Video monitoring was available and reviewed as appropriate. Description: The posterior dominant rhythm consists of 7 Hz activity of moderate voltage (25-35 uV) seen predominantly in posterior head regions, symmetric and reactive to eye opening and eye closing. EEG showed continuous generalized predominantly 5 to 6 Hz theta slowing admixed with intermittent 2-3Hz  delta slowing. Hyperventilation and photic stimulation were not performed.   ABNORMALITY - Continuous slow, generalized - Background slow IMPRESSION: This study is suggestive of moderate diffuse encephalopathy. No seizures or epileptiform discharges were seen throughout  the recording. [name]   VAS US CAROTID  Result Date: 05/25/2023 Carotid Arterial Duplex Study Patient Name:  [name]  Date of Exam:   05/25/2023 Medical Rec #: [id number]       Accession #:    [id number] Date of Birth: 10/29/1929       Patient Gender: M Patient Age:   20 years Exam Location:  [facility] Procedure:      VAS US CAROTID Referring Phys: Terrilee Files [facility] --------------------------------------------------------------------------------  Indications:       CVA. Risk Factors:      Hypertension, hyperlipidemia, Diabetes. Comparison Study:  05/25/2023 - CT ANGIO HEAD NECK W WO CM                     1. Positive for abnormal Right Vertebral Artery, new since a                    2021                    CTA:                    Differential considerations include atherosclerotic Occlusion                    of the                    Right V1 segment (severe calcified atherosclerotic stenosis                    at the                    right vertebral origin) versus slow retrograde flow within                    the                    vessel due to Right Subclavian Steal (see #2). Doppler                    Ultrasound  STROKE TEAM PROGRESS NOTE   BRIEF HPI [name] is a 89 y.o. male with history of HTN, HLD, CAD, DM2, 3x previous strokes or TIAs (2x in 2015, 1x in 2021) pafibb on xarelto  presenting with a 20 minute episode of word-finding difficulty with complete resolution.  He also reported reflux like symptoms over the last week and found to have elevated troponin and is being admitted for further evaluation and management of NSTEMI.   Head and neck CT angiogram from 10/21 showed high-grade stenosis in the right subclavian artery, tortuous but patent left subclavian artery, moderate stenosis in the right internal carotid artery, plaque without significant stenosis in the left internal carotid artery.   SIGNIFICANT HOSPITAL EVENTS 10/21 - ED for stroke-like symptoms, elevated troponin found 10/22 - Admitted to floor bed, EEG ordered.  INTERIM HISTORY/SUBJECTIVE Pt lying in bed comfortably.  His son is at the bedside.  He remains confused and disoriented.  According to his son he is not back to his baseline.  He was having some transient hallucinations last night.  EEG shows no definite seizure activity but moderate diffuse encephalopathy.  OBJECTIVE  CBC    Component Value Date/Time   WBC 7.2 05/26/2023 0216   RBC 3.65 (L) 05/26/2023 0216   HGB 12.5 (L) 05/26/2023 0216   HGB 13.1 04/21/2023 1046   HCT 35.2 (L) 05/26/2023 0216   HCT 38.5 04/21/2023 1046   PLT 167 05/26/2023 0216   PLT 194 04/21/2023 1046   MCV 96.4 05/26/2023 0216   MCV 101 (H) 04/21/2023 1046   MCH 34.2 (H) 05/26/2023 0216   MCHC 35.5 05/26/2023 0216   RDW 12.6 05/26/2023 0216   RDW 12.0 04/21/2023 1046   LYMPHSABS 1.7 05/25/2023 0316   LYMPHSABS 1.8 04/21/2023 1046   MONOABS 1.0 05/25/2023 0316   EOSABS 0.1 05/25/2023 0316   EOSABS 0.3 04/21/2023 1046   BASOSABS 0.0 05/25/2023 0316   BASOSABS 0.0 04/21/2023 1046    BMET    Component Value Date/Time   NA 129 (L) 05/26/2023 0216   NA 134 04/21/2023 1046    K 3.7 05/26/2023 0216   CL 94 (L) 05/26/2023 0216   CO2 25 05/26/2023 0216   GLUCOSE 147 (H) 05/26/2023 0216   BUN 21 05/26/2023 0216   BUN 13 04/21/2023 1046   CREATININE 1.42 (H) 05/26/2023 0216   CREATININE 1.27 02/02/2013 0853   CALCIUM 8.7 (L) 05/26/2023 0216   EGFR 53 (L) 04/21/2023 1046   GFRNONAA 47 (L) 05/26/2023 0216   GFRNONAA 53 (L) 02/02/2013 0853    IMAGING past 24 hours ECHOCARDIOGRAM COMPLETE  Result Date: 05/26/2023    ECHOCARDIOGRAM REPORT   Patient Name:   [name] Date of Exam: 05/26/2023 Medical Rec #:  [id number]      Height:       71.0 in Accession #:    [id number]     Weight:       183.4 lb Date of Birth:  1929/09/20      BSA:          2.032 m Patient Age:    89 years       BP:           138/77 mmHg Patient Gender: M              HR:           79 bpm. Exam Location:  Inpatient Procedure: 2D Echo, 3D Echo, Cardiac Doppler and Color Doppler Indications:  STROKE TEAM PROGRESS NOTE   BRIEF HPI [name] is a 89 y.o. male with history of HTN, HLD, CAD, DM2, 3x previous strokes or TIAs (2x in 2015, 1x in 2021) pafibb on xarelto  presenting with a 20 minute episode of word-finding difficulty with complete resolution.  He also reported reflux like symptoms over the last week and found to have elevated troponin and is being admitted for further evaluation and management of NSTEMI.   Head and neck CT angiogram from 10/21 showed high-grade stenosis in the right subclavian artery, tortuous but patent left subclavian artery, moderate stenosis in the right internal carotid artery, plaque without significant stenosis in the left internal carotid artery.   SIGNIFICANT HOSPITAL EVENTS 10/21 - ED for stroke-like symptoms, elevated troponin found 10/22 - Admitted to floor bed, EEG ordered.  INTERIM HISTORY/SUBJECTIVE Pt lying in bed comfortably.  His son is at the bedside.  He remains confused and disoriented.  According to his son he is not back to his baseline.  He was having some transient hallucinations last night.  EEG shows no definite seizure activity but moderate diffuse encephalopathy.  OBJECTIVE  CBC    Component Value Date/Time   WBC 7.2 05/26/2023 0216   RBC 3.65 (L) 05/26/2023 0216   HGB 12.5 (L) 05/26/2023 0216   HGB 13.1 04/21/2023 1046   HCT 35.2 (L) 05/26/2023 0216   HCT 38.5 04/21/2023 1046   PLT 167 05/26/2023 0216   PLT 194 04/21/2023 1046   MCV 96.4 05/26/2023 0216   MCV 101 (H) 04/21/2023 1046   MCH 34.2 (H) 05/26/2023 0216   MCHC 35.5 05/26/2023 0216   RDW 12.6 05/26/2023 0216   RDW 12.0 04/21/2023 1046   LYMPHSABS 1.7 05/25/2023 0316   LYMPHSABS 1.8 04/21/2023 1046   MONOABS 1.0 05/25/2023 0316   EOSABS 0.1 05/25/2023 0316   EOSABS 0.3 04/21/2023 1046   BASOSABS 0.0 05/25/2023 0316   BASOSABS 0.0 04/21/2023 1046    BMET    Component Value Date/Time   NA 129 (L) 05/26/2023 0216   NA 134 04/21/2023 1046    K 3.7 05/26/2023 0216   CL 94 (L) 05/26/2023 0216   CO2 25 05/26/2023 0216   GLUCOSE 147 (H) 05/26/2023 0216   BUN 21 05/26/2023 0216   BUN 13 04/21/2023 1046   CREATININE 1.42 (H) 05/26/2023 0216   CREATININE 1.27 02/02/2013 0853   CALCIUM 8.7 (L) 05/26/2023 0216   EGFR 53 (L) 04/21/2023 1046   GFRNONAA 47 (L) 05/26/2023 0216   GFRNONAA 53 (L) 02/02/2013 0853    IMAGING past 24 hours ECHOCARDIOGRAM COMPLETE  Result Date: 05/26/2023    ECHOCARDIOGRAM REPORT   Patient Name:   [name] Date of Exam: 05/26/2023 Medical Rec #:  [id number]      Height:       71.0 in Accession #:    [id number]     Weight:       183.4 lb Date of Birth:  1929/09/20      BSA:          2.032 m Patient Age:    89 years       BP:           138/77 mmHg Patient Gender: M              HR:           79 bpm. Exam Location:  Inpatient Procedure: 2D Echo, 3D Echo, Cardiac Doppler and Color Doppler Indications:  [name] MRN: [id number] Epilepsy Attending: Charlsie Quest Referring Physician/Provider: Margaretmary Dys, MD Date: 05/24/2023 Duration: 23.24 mins Patient history:  89 y.o. male with history of HTN, HLD, CAD, DM2, 3x previous strokes or TIAs (2x in 2015, 1x in 2021) afib on xarelto  presenting with a 20 minute episode of word-finding difficulty with complete resolution. Level of alertness: Awake AEDs during EEG study: None Technical aspects: This EEG study was done with scalp electrodes positioned according to the 10-20 International system of electrode placement. Electrical activity was reviewed with band pass filter of 1-70Hz , sensitivity of 7 uV/mm, display speed of 40mm/sec with a 60Hz  notched filter applied as appropriate. EEG data were recorded continuously and digitally stored.  Video monitoring was available and reviewed as appropriate. Description: The posterior dominant rhythm consists of 7 Hz activity of moderate voltage (25-35 uV) seen predominantly in posterior head regions, symmetric and reactive to eye opening and eye closing. EEG showed continuous generalized predominantly 5 to 6 Hz theta slowing admixed with intermittent 2-3Hz  delta slowing. Hyperventilation and photic stimulation were not performed.   ABNORMALITY - Continuous slow, generalized - Background slow IMPRESSION: This study is suggestive of moderate diffuse encephalopathy. No seizures or epileptiform discharges were seen throughout  the recording. [name]   VAS US CAROTID  Result Date: 05/25/2023 Carotid Arterial Duplex Study Patient Name:  [name]  Date of Exam:   05/25/2023 Medical Rec #: [id number]       Accession #:    [id number] Date of Birth: 10/29/1929       Patient Gender: M Patient Age:   20 years Exam Location:  [facility] Procedure:      VAS US CAROTID Referring Phys: Terrilee Files [facility] --------------------------------------------------------------------------------  Indications:       CVA. Risk Factors:      Hypertension, hyperlipidemia, Diabetes. Comparison Study:  05/25/2023 - CT ANGIO HEAD NECK W WO CM                     1. Positive for abnormal Right Vertebral Artery, new since a                    2021                    CTA:                    Differential considerations include atherosclerotic Occlusion                    of the                    Right V1 segment (severe calcified atherosclerotic stenosis                    at the                    right vertebral origin) versus slow retrograde flow within                    the                    vessel due to Right Subclavian Steal (see #2). Doppler                    Ultrasound  [name] MRN: [id number] Epilepsy Attending: Charlsie Quest Referring Physician/Provider: Margaretmary Dys, MD Date: 05/24/2023 Duration: 23.24 mins Patient history:  89 y.o. male with history of HTN, HLD, CAD, DM2, 3x previous strokes or TIAs (2x in 2015, 1x in 2021) afib on xarelto  presenting with a 20 minute episode of word-finding difficulty with complete resolution. Level of alertness: Awake AEDs during EEG study: None Technical aspects: This EEG study was done with scalp electrodes positioned according to the 10-20 International system of electrode placement. Electrical activity was reviewed with band pass filter of 1-70Hz , sensitivity of 7 uV/mm, display speed of 40mm/sec with a 60Hz  notched filter applied as appropriate. EEG data were recorded continuously and digitally stored.  Video monitoring was available and reviewed as appropriate. Description: The posterior dominant rhythm consists of 7 Hz activity of moderate voltage (25-35 uV) seen predominantly in posterior head regions, symmetric and reactive to eye opening and eye closing. EEG showed continuous generalized predominantly 5 to 6 Hz theta slowing admixed with intermittent 2-3Hz  delta slowing. Hyperventilation and photic stimulation were not performed.   ABNORMALITY - Continuous slow, generalized - Background slow IMPRESSION: This study is suggestive of moderate diffuse encephalopathy. No seizures or epileptiform discharges were seen throughout  the recording. [name]   VAS US CAROTID  Result Date: 05/25/2023 Carotid Arterial Duplex Study Patient Name:  [name]  Date of Exam:   05/25/2023 Medical Rec #: [id number]       Accession #:    [id number] Date of Birth: 10/29/1929       Patient Gender: M Patient Age:   20 years Exam Location:  [facility] Procedure:      VAS US CAROTID Referring Phys: Terrilee Files [facility] --------------------------------------------------------------------------------  Indications:       CVA. Risk Factors:      Hypertension, hyperlipidemia, Diabetes. Comparison Study:  05/25/2023 - CT ANGIO HEAD NECK W WO CM                     1. Positive for abnormal Right Vertebral Artery, new since a                    2021                    CTA:                    Differential considerations include atherosclerotic Occlusion                    of the                    Right V1 segment (severe calcified atherosclerotic stenosis                    at the                    right vertebral origin) versus slow retrograde flow within                    the                    vessel due to Right Subclavian Steal (see #2). Doppler                    Ultrasound  STROKE TEAM PROGRESS NOTE   BRIEF HPI [name] is a 89 y.o. male with history of HTN, HLD, CAD, DM2, 3x previous strokes or TIAs (2x in 2015, 1x in 2021) pafibb on xarelto  presenting with a 20 minute episode of word-finding difficulty with complete resolution.  He also reported reflux like symptoms over the last week and found to have elevated troponin and is being admitted for further evaluation and management of NSTEMI.   Head and neck CT angiogram from 10/21 showed high-grade stenosis in the right subclavian artery, tortuous but patent left subclavian artery, moderate stenosis in the right internal carotid artery, plaque without significant stenosis in the left internal carotid artery.   SIGNIFICANT HOSPITAL EVENTS 10/21 - ED for stroke-like symptoms, elevated troponin found 10/22 - Admitted to floor bed, EEG ordered.  INTERIM HISTORY/SUBJECTIVE Pt lying in bed comfortably.  His son is at the bedside.  He remains confused and disoriented.  According to his son he is not back to his baseline.  He was having some transient hallucinations last night.  EEG shows no definite seizure activity but moderate diffuse encephalopathy.  OBJECTIVE  CBC    Component Value Date/Time   WBC 7.2 05/26/2023 0216   RBC 3.65 (L) 05/26/2023 0216   HGB 12.5 (L) 05/26/2023 0216   HGB 13.1 04/21/2023 1046   HCT 35.2 (L) 05/26/2023 0216   HCT 38.5 04/21/2023 1046   PLT 167 05/26/2023 0216   PLT 194 04/21/2023 1046   MCV 96.4 05/26/2023 0216   MCV 101 (H) 04/21/2023 1046   MCH 34.2 (H) 05/26/2023 0216   MCHC 35.5 05/26/2023 0216   RDW 12.6 05/26/2023 0216   RDW 12.0 04/21/2023 1046   LYMPHSABS 1.7 05/25/2023 0316   LYMPHSABS 1.8 04/21/2023 1046   MONOABS 1.0 05/25/2023 0316   EOSABS 0.1 05/25/2023 0316   EOSABS 0.3 04/21/2023 1046   BASOSABS 0.0 05/25/2023 0316   BASOSABS 0.0 04/21/2023 1046    BMET    Component Value Date/Time   NA 129 (L) 05/26/2023 0216   NA 134 04/21/2023 1046    K 3.7 05/26/2023 0216   CL 94 (L) 05/26/2023 0216   CO2 25 05/26/2023 0216   GLUCOSE 147 (H) 05/26/2023 0216   BUN 21 05/26/2023 0216   BUN 13 04/21/2023 1046   CREATININE 1.42 (H) 05/26/2023 0216   CREATININE 1.27 02/02/2013 0853   CALCIUM 8.7 (L) 05/26/2023 0216   EGFR 53 (L) 04/21/2023 1046   GFRNONAA 47 (L) 05/26/2023 0216   GFRNONAA 53 (L) 02/02/2013 0853    IMAGING past 24 hours ECHOCARDIOGRAM COMPLETE  Result Date: 05/26/2023    ECHOCARDIOGRAM REPORT   Patient Name:   [name] Date of Exam: 05/26/2023 Medical Rec #:  [id number]      Height:       71.0 in Accession #:    [id number]     Weight:       183.4 lb Date of Birth:  1929/09/20      BSA:          2.032 m Patient Age:    89 years       BP:           138/77 mmHg Patient Gender: M              HR:           79 bpm. Exam Location:  Inpatient Procedure: 2D Echo, 3D Echo, Cardiac Doppler and Color Doppler Indications:  [name] MRN: [id number] Epilepsy Attending: Charlsie Quest Referring Physician/Provider: Margaretmary Dys, MD Date: 05/24/2023 Duration: 23.24 mins Patient history:  89 y.o. male with history of HTN, HLD, CAD, DM2, 3x previous strokes or TIAs (2x in 2015, 1x in 2021) afib on xarelto  presenting with a 20 minute episode of word-finding difficulty with complete resolution. Level of alertness: Awake AEDs during EEG study: None Technical aspects: This EEG study was done with scalp electrodes positioned according to the 10-20 International system of electrode placement. Electrical activity was reviewed with band pass filter of 1-70Hz , sensitivity of 7 uV/mm, display speed of 40mm/sec with a 60Hz  notched filter applied as appropriate. EEG data were recorded continuously and digitally stored.  Video monitoring was available and reviewed as appropriate. Description: The posterior dominant rhythm consists of 7 Hz activity of moderate voltage (25-35 uV) seen predominantly in posterior head regions, symmetric and reactive to eye opening and eye closing. EEG showed continuous generalized predominantly 5 to 6 Hz theta slowing admixed with intermittent 2-3Hz  delta slowing. Hyperventilation and photic stimulation were not performed.   ABNORMALITY - Continuous slow, generalized - Background slow IMPRESSION: This study is suggestive of moderate diffuse encephalopathy. No seizures or epileptiform discharges were seen throughout  the recording. [name]   VAS US CAROTID  Result Date: 05/25/2023 Carotid Arterial Duplex Study Patient Name:  [name]  Date of Exam:   05/25/2023 Medical Rec #: [id number]       Accession #:    [id number] Date of Birth: 10/29/1929       Patient Gender: M Patient Age:   20 years Exam Location:  [facility] Procedure:      VAS US CAROTID Referring Phys: Terrilee Files [facility] --------------------------------------------------------------------------------  Indications:       CVA. Risk Factors:      Hypertension, hyperlipidemia, Diabetes. Comparison Study:  05/25/2023 - CT ANGIO HEAD NECK W WO CM                     1. Positive for abnormal Right Vertebral Artery, new since a                    2021                    CTA:                    Differential considerations include atherosclerotic Occlusion                    of the                    Right V1 segment (severe calcified atherosclerotic stenosis                    at the                    right vertebral origin) versus slow retrograde flow within                    the                    vessel due to Right Subclavian Steal (see #2). Doppler                    Ultrasound  STROKE TEAM PROGRESS NOTE   BRIEF HPI [name] is a 89 y.o. male with history of HTN, HLD, CAD, DM2, 3x previous strokes or TIAs (2x in 2015, 1x in 2021) pafibb on xarelto  presenting with a 20 minute episode of word-finding difficulty with complete resolution.  He also reported reflux like symptoms over the last week and found to have elevated troponin and is being admitted for further evaluation and management of NSTEMI.   Head and neck CT angiogram from 10/21 showed high-grade stenosis in the right subclavian artery, tortuous but patent left subclavian artery, moderate stenosis in the right internal carotid artery, plaque without significant stenosis in the left internal carotid artery.   SIGNIFICANT HOSPITAL EVENTS 10/21 - ED for stroke-like symptoms, elevated troponin found 10/22 - Admitted to floor bed, EEG ordered.  INTERIM HISTORY/SUBJECTIVE Pt lying in bed comfortably.  His son is at the bedside.  He remains confused and disoriented.  According to his son he is not back to his baseline.  He was having some transient hallucinations last night.  EEG shows no definite seizure activity but moderate diffuse encephalopathy.  OBJECTIVE  CBC    Component Value Date/Time   WBC 7.2 05/26/2023 0216   RBC 3.65 (L) 05/26/2023 0216   HGB 12.5 (L) 05/26/2023 0216   HGB 13.1 04/21/2023 1046   HCT 35.2 (L) 05/26/2023 0216   HCT 38.5 04/21/2023 1046   PLT 167 05/26/2023 0216   PLT 194 04/21/2023 1046   MCV 96.4 05/26/2023 0216   MCV 101 (H) 04/21/2023 1046   MCH 34.2 (H) 05/26/2023 0216   MCHC 35.5 05/26/2023 0216   RDW 12.6 05/26/2023 0216   RDW 12.0 04/21/2023 1046   LYMPHSABS 1.7 05/25/2023 0316   LYMPHSABS 1.8 04/21/2023 1046   MONOABS 1.0 05/25/2023 0316   EOSABS 0.1 05/25/2023 0316   EOSABS 0.3 04/21/2023 1046   BASOSABS 0.0 05/25/2023 0316   BASOSABS 0.0 04/21/2023 1046    BMET    Component Value Date/Time   NA 129 (L) 05/26/2023 0216   NA 134 04/21/2023 1046    K 3.7 05/26/2023 0216   CL 94 (L) 05/26/2023 0216   CO2 25 05/26/2023 0216   GLUCOSE 147 (H) 05/26/2023 0216   BUN 21 05/26/2023 0216   BUN 13 04/21/2023 1046   CREATININE 1.42 (H) 05/26/2023 0216   CREATININE 1.27 02/02/2013 0853   CALCIUM 8.7 (L) 05/26/2023 0216   EGFR 53 (L) 04/21/2023 1046   GFRNONAA 47 (L) 05/26/2023 0216   GFRNONAA 53 (L) 02/02/2013 0853    IMAGING past 24 hours ECHOCARDIOGRAM COMPLETE  Result Date: 05/26/2023    ECHOCARDIOGRAM REPORT   Patient Name:   [name] Date of Exam: 05/26/2023 Medical Rec #:  [id number]      Height:       71.0 in Accession #:    [id number]     Weight:       183.4 lb Date of Birth:  1929/09/20      BSA:          2.032 m Patient Age:    89 years       BP:           138/77 mmHg Patient Gender: M              HR:           79 bpm. Exam Location:  Inpatient Procedure: 2D Echo, 3D Echo, Cardiac Doppler and Color Doppler Indications:

## 2023-05-26 NOTE — Plan of Care (Signed)
  Problem: Ischemic Stroke/TIA Tissue Perfusion: Goal: Complications of ischemic stroke/TIA will be minimized Outcome: Progressing   Problem: Clinical Measurements: Goal: Cardiovascular complication will be avoided Outcome: Progressing   Problem: Coping: Goal: Level of anxiety will decrease Outcome: Progressing   Problem: Safety: Goal: Ability to remain free from injury will improve Outcome: Progressing

## 2023-05-26 NOTE — Progress Notes (Signed)
  Echocardiogram 2D Echocardiogram has been performed.  Sheralyn Boatman R 05/26/2023, 10:00 AM

## 2023-05-26 NOTE — Progress Notes (Signed)
PHARMACY - ANTICOAGULATION CONSULT NOTE  Pharmacy Consult for heparin Indication: Afib/ACS  Allergies  Allergen Reactions   Alteplase Anaphylaxis   Codeine Other (See Comments)    Bad dreams   Diclofenac Sodium Diarrhea   Gemfibrozil Nausea Only    Other Reaction(s): GI Intolerance   Voltaren [Diclofenac Sodium] Diarrhea    Patient Measurements: Height: 5\' 11"  (180.3 cm) Weight: 90.7 kg (200 lb) IBW/kg (Calculated) : 75.3 Heparin Dosing Weight: TBW  Vital Signs: BP: 148/90 (10/22 2025) Pulse Rate: 82 (10/22 2025)  Labs: Recent Labs    05/24/23 2126 05/24/23 2345 05/25/23 0316 05/26/23 0216  HGB 13.0  --  11.9* 12.5*  HCT 36.8*  --  34.0* 35.2*  PLT 172  --  152 167  APTT  --   --   --  32  HEPARINUNFRC  --   --   --  >1.10*  CREATININE 1.32*  --  1.42* 1.42*  TROPONINIHS 1,131* 1,083*  --   --     Estimated Creatinine Clearance: 39.1 mL/min (A) (by C-G formula based on SCr of 1.42 mg/dL (H)).   Medical History: Past Medical History:  Diagnosis Date   Arthritis    "wrists, knees, back" (12/10/2015)   Asthma    BCC (basal cell carcinoma of skin) 01/28/2017   left inner cheek--sup & nod (CX3, 5FU)   BCC (basal cell carcinoma) 01/26/2017   left chin (molts)   BCC (basal cell carcinoma) 04/01/2017   midback--sup & nod (tx p bx)   BCC (basal cell carcinoma) 10/13/2017   above left outer eye brow--infiltrative (CX3, 5FU)   BCC (basal cell carcinoma) 06/02/2018   right post forearm--nod (tx p bx)   BCC (basal cell carcinoma) 03/09/2019   right mid outer cheek--nod--(tx p bx)   Cerebral atherosclerosis    Chronic back pain    "lower primarily" (12/10/2015)   CORONARY ATHEROSCLEROSIS NATIVE CORONARY ARTERY    Myocardial infarction at [facility] in 1996 with a directional atherectomy of the 90% LAD stenosis and medical management of 60-70% proximal circumflex and 50% RCA stenosis.   DYSLIPIDEMIA    GERD (gastroesophageal reflux disease)    History of hiatal  hernia    HYPERTENSION    ILIAC ARTERY ANEURYSM    Myocardial infarction (HCC) 1996   SCC (squamous cell carcinoma) 10/29/2016   left ear--in situ (tx p bx)   SCC (squamous cell carcinoma) 01/26/2017   left sideburn--well diff--(CX3, 5FU)   SCC (squamous cell carcinoma) 01/28/2017   left upper cheek--in situ (CX3, 5FU)   SCC (squamous cell carcinoma) 05/06/2017   left cheek--sup &well diff (tx p bx)   SCC (squamous cell carcinoma) 10/13/2017   right outer forearm--in situ (CX3 5FU)   SCC (squamous cell carcinoma) 10/13/2017   right ouer forarm--well diff (CX3 5FU)   SCC (squamous cell carcinoma) 10/13/2017   left sideburn, superior--well diff (CX3, 5FU)   SCC (squamous cell carcinoma) 06/02/2018   right forearm--in situ (tx p bx)   SCC (squamous cell carcinoma) 03/09/2019   left zy goma--well diff (tx p bx)   SCC (squamous cell carcinoma) 03/09/2019   right pre auricular--well diff (tx p bx)   SCCA (squamous cell carcinoma) of skin 05/22/2021   Left Buccal Cheek (in situ)   Stroke (HCC) 2003; 2013; 07/2015; 12/05/2015   denies residual on 12/10/2015, 12/13/16, 01/08/17   TIA (transient ischemic attack)    Type II diabetes mellitus (HCC)    UPPER RESPIRATORY INFECTION  Assessment: 5 YOM presenting with SOB, hx of afib on Xarelto PTA with last dose 10/21 @1800 , on hold for possible procedure.    aPTT normal, heparin level >1.1, no infusion issues.  Goal of Therapy:  Heparin level 0.3-0.7 units/ml aPTT 66-102 seconds Monitor platelets by anticoagulation protocol: Yes   Plan:  Increase heparin to 1500 units/h F/u 8h aPTT  Fredonia Highland, PharmD, BCPS, [facility] Clinical Pharmacist Please check AMION for all [facility] Pharmacy numbers 05/26/2023

## 2023-05-26 NOTE — Evaluation (Signed)
Speech Language Pathology Evaluation Patient Details Name: [name] MRN: [id number] DOB: [birth year] Today's Date: 05/26/2023 Time: 7829-5621 SLP Time Calculation (min) (ACUTE ONLY): 13 min  Problem List:  Patient Active Problem List   Diagnosis Date Noted   NSTEMI (non-ST elevated myocardial infarction) (HCC) 05/25/2023   Hypomagnesemia 05/25/2023   Chronic diastolic CHF (congestive heart failure) (HCC) 05/25/2023   Moderate persistent asthma 05/25/2023   Acute on chronic diastolic heart failure (HCC) 05/25/2023   Aortic valve stenosis, nonrheumatic 02/09/2022   SOB (shortness of breath) 02/09/2022   Paroxysmal atrial fibrillation (HCC) 01/19/2018   Hearing loss, sensorineural, asymmetrical 09/07/2017   History of CVA (cerebrovascular accident) 04/21/2017   PFO (patent foramen ovale) 12/30/2016   Chronic kidney disease, stage 3a (HCC) 12/13/2016   GERD (gastroesophageal reflux disease) 02/25/2016   Malnutrition of moderate degree 12/11/2015   Type 2 diabetes mellitus with hyperlipidemia (HCC) 12/06/2015   Dysarthria 12/05/2015   TIA (transient ischemic attack) 09/12/2013   Chronic sinusitis 09/12/2013   Disease of nasal cavity and sinuses 09/11/2013   Pulmonary nodules 09/11/2013   Metabolic syndrome 08/01/2013   Vitamin D deficiency 08/01/2013   PVD (peripheral vascular disease) (HCC) 11/25/2011   ILIAC ARTERY ANEURYSM 07/17/2010   Hyperlipidemia 08/28/2009   Coronary artery disease involving native coronary artery of native heart with unstable angina pectoris (HCC) 08/28/2009   Essential hypertension 06/05/2009   Cerebral atherosclerosis 06/04/2009   Past Medical History:  Past Medical History:  Diagnosis Date   Arthritis    "wrists, knees, back" (12/10/2015)   Asthma    BCC (basal cell carcinoma of skin) 01/28/2017   left inner cheek--sup & nod (CX3, 5FU)   BCC (basal cell carcinoma) 01/26/2017   left chin (molts)   BCC (basal cell carcinoma) 04/01/2017    midback--sup & nod (tx p bx)   BCC (basal cell carcinoma) 10/13/2017   above left outer eye brow--infiltrative (CX3, 5FU)   BCC (basal cell carcinoma) 06/02/2018   right post forearm--nod (tx p bx)   BCC (basal cell carcinoma) 03/09/2019   right mid outer cheek--nod--(tx p bx)   Cerebral atherosclerosis    Chronic back pain    "lower primarily" (12/10/2015)   CORONARY ATHEROSCLEROSIS NATIVE CORONARY ARTERY    Myocardial infarction at [facility] in 1996 with a directional atherectomy of the 90% LAD stenosis and medical management of 60-70% proximal circumflex and 50% RCA stenosis.   DYSLIPIDEMIA    GERD (gastroesophageal reflux disease)    History of hiatal hernia    HYPERTENSION    ILIAC ARTERY ANEURYSM    Myocardial infarction (HCC) 1996   SCC (squamous cell carcinoma) 10/29/2016   left ear--in situ (tx p bx)   SCC (squamous cell carcinoma) 01/26/2017   left sideburn--well diff--(CX3, 5FU)   SCC (squamous cell carcinoma) 01/28/2017   left upper cheek--in situ (CX3, 5FU)   SCC (squamous cell carcinoma) 05/06/2017   left cheek--sup &well diff (tx p bx)   SCC (squamous cell carcinoma) 10/13/2017   right outer forearm--in situ (CX3 5FU)   SCC (squamous cell carcinoma) 10/13/2017   right ouer forarm--well diff (CX3 5FU)   SCC (squamous cell carcinoma) 10/13/2017   left sideburn, superior--well diff (CX3, 5FU)   SCC (squamous cell carcinoma) 06/02/2018   right forearm--in situ (tx p bx)   SCC (squamous cell carcinoma) 03/09/2019   left zy goma--well diff (tx p bx)   SCC (squamous cell carcinoma) 03/09/2019   right pre auricular--well diff (tx p bx)  SCCA (squamous cell carcinoma) of skin 05/22/2021   Left Buccal Cheek (in situ)   Stroke (HCC) 2003; 2013; 07/2015; 12/05/2015   denies residual on 12/10/2015, 12/13/16, 01/08/17   TIA (transient ischemic attack)    Type II diabetes mellitus (HCC)    UPPER RESPIRATORY INFECTION    Past Surgical History:  Past Surgical History:   Procedure Laterality Date   ANAL FISSURE REPAIR  1990s   BASAL CELL CARCINOMA EXCISION Right 12/2015   "temple"   CATARACT EXTRACTION W/ INTRAOCULAR LENS  IMPLANT, BILATERAL Bilateral 2000s   CORONARY ANGIOPLASTY  1996   KNEE CARTILAGE SURGERY Right 1970s   LOOP RECORDER INSERTION N/A 12/15/2016   Procedure: Loop Recorder Insertion;  Surgeon: Hillis Range, MD;  Location: [facility] INVASIVE CV LAB;  Service: Cardiovascular;  Laterality: N/A;   SHOULDER ARTHROSCOPY W/ ROTATOR CUFF REPAIR Left 2000s   TEE WITHOUT CARDIOVERSION N/A 12/15/2016   Procedure: TRANSESOPHAGEAL ECHOCARDIOGRAM (TEE);  Surgeon: Lewayne Bunting, MD;  Location: [facility] ENDOSCOPY;  Service: Cardiovascular;  Laterality: N/A;   HPI:  [name] is a 89 y.o. male with history of HTN, HLD, CAD, DM2, 3x previous strokes or TIAs (2x in 2015, 1x in 2021) presented with a 20 minute episode of word-finding difficulty with complete resolution.  He also reported reflux like symptoms over the last week and found to have elevated troponin and is being admitted for further evaluation and management of NSTEMI.  Not back to baseline per son's report to neuro.   Assessment / Plan / Recommendation Clinical Impression  Pt presents with confusion, disorientation to elements of time/situation/place.  Speech is clear; functional comprehension and expression are intact.  Long-term recall WFL.  Referencing people/vehicles that were not present today; easily redirectable and pleasant but with poor awareness/judgment. Recommend SLP f/u while admitted; post-acute SLP needs TBA.    SLP Assessment  SLP Recommendation/Assessment: Patient needs continued Speech Lanaguage Pathology Services SLP Visit Diagnosis: Cognitive communication deficit (R41.841)    Recommendations for follow up therapy are one component of a multi-disciplinary discharge planning process, led by the attending physician.  Recommendations may be updated based on patient status,  additional functional criteria and insurance authorization.    Follow Up Recommendations  Other (comment) (tba)    Assistance Recommended at Discharge  Frequent or constant Supervision/Assistance  Functional Status Assessment Patient has had a recent decline in their functional status and demonstrates the ability to make significant improvements in function in a reasonable and predictable amount of time.  Frequency and Duration min 1 x/week  2 weeks      SLP Evaluation Cognition  Overall Cognitive Status: Impaired/Different from baseline Arousal/Alertness: Awake/alert Orientation Level: Oriented to person;Disoriented to place;Disoriented to time;Disoriented to situation Attention: Selective Selective Attention: Impaired Selective Attention Impairment: Verbal basic Memory: Impaired Memory Impairment: Storage deficit Awareness: Impaired Awareness Impairment: Intellectual impairment Problem Solving: Impaired Problem Solving Impairment: Verbal basic Safety/Judgment: Impaired       Comprehension  Auditory Comprehension Overall Auditory Comprehension: Appears within functional limits for tasks assessed Visual Recognition/Discrimination Discrimination: Within Function Limits Reading Comprehension Reading Status: Not tested    Expression Expression Primary Mode of Expression: Verbal Verbal Expression Overall Verbal Expression: Appears within functional limits for tasks assessed   Oral / Motor  Oral Motor/Sensory Function Overall Oral Motor/Sensory Function: Within functional limits Motor Speech Overall Motor Speech: Appears within functional limits for tasks assessed            Blenda Mounts Laurice 05/26/2023, 3:33 PM Marchelle Folks  Jearld Shines, MA CCC/SLP Clinical Specialist - Acute Care SLP Acute Rehabilitation Services Office number [phone]

## 2023-05-26 NOTE — Progress Notes (Signed)
PHARMACY - ANTICOAGULATION CONSULT NOTE  Pharmacy Consult for heparin Indication: Afib/ACS  Allergies  Allergen Reactions   Alteplase Anaphylaxis   Codeine Other (See Comments)    Bad dreams   Diclofenac Sodium Diarrhea   Gemfibrozil Nausea Only    Other Reaction(s): GI Intolerance   Voltaren [Diclofenac Sodium] Diarrhea    Patient Measurements: Height: 5\' 11"  (180.3 cm) Weight: 83.2 kg (183 lb 6.8 oz) IBW/kg (Calculated) : 75.3 Heparin Dosing Weight: TBW  Vital Signs: Temp: 98.4 F (36.9 C) (10/23 1101) Temp Source: Oral (10/23 1101) BP: 151/92 (10/23 1101) Pulse Rate: 65 (10/23 1101)  Labs: Recent Labs    05/24/23 2126 05/24/23 2345 05/25/23 0316 05/26/23 0216 05/26/23 1137  HGB 13.0  --  11.9* 12.5*  --   HCT 36.8*  --  34.0* 35.2*  --   PLT 172  --  152 167  --   APTT  --   --   --  32 130*  HEPARINUNFRC  --   --   --  >1.10*  --   CREATININE 1.32*  --  1.42* 1.42*  --   TROPONINIHS 1,131* 1,083*  --   --   --     Estimated Creatinine Clearance: 36.1 mL/min (A) (by C-G formula based on SCr of 1.42 mg/dL (H)).   Medical History: Past Medical History:  Diagnosis Date   Arthritis    "wrists, knees, back" (12/10/2015)   Asthma    BCC (basal cell carcinoma of skin) 01/28/2017   left inner cheek--sup & nod (CX3, 5FU)   BCC (basal cell carcinoma) 01/26/2017   left chin (molts)   BCC (basal cell carcinoma) 04/01/2017   midback--sup & nod (tx p bx)   BCC (basal cell carcinoma) 10/13/2017   above left outer eye brow--infiltrative (CX3, 5FU)   BCC (basal cell carcinoma) 06/02/2018   right post forearm--nod (tx p bx)   BCC (basal cell carcinoma) 03/09/2019   right mid outer cheek--nod--(tx p bx)   Cerebral atherosclerosis    Chronic back pain    "lower primarily" (12/10/2015)   CORONARY ATHEROSCLEROSIS NATIVE CORONARY ARTERY    Myocardial infarction at [facility] in 1996 with a directional atherectomy of the 90% LAD stenosis and medical management of 60-70%  proximal circumflex and 50% RCA stenosis.   DYSLIPIDEMIA    GERD (gastroesophageal reflux disease)    History of hiatal hernia    HYPERTENSION    ILIAC ARTERY ANEURYSM    Myocardial infarction (HCC) 1996   SCC (squamous cell carcinoma) 10/29/2016   left ear--in situ (tx p bx)   SCC (squamous cell carcinoma) 01/26/2017   left sideburn--well diff--(CX3, 5FU)   SCC (squamous cell carcinoma) 01/28/2017   left upper cheek--in situ (CX3, 5FU)   SCC (squamous cell carcinoma) 05/06/2017   left cheek--sup &well diff (tx p bx)   SCC (squamous cell carcinoma) 10/13/2017   right outer forearm--in situ (CX3 5FU)   SCC (squamous cell carcinoma) 10/13/2017   right ouer forarm--well diff (CX3 5FU)   SCC (squamous cell carcinoma) 10/13/2017   left sideburn, superior--well diff (CX3, 5FU)   SCC (squamous cell carcinoma) 06/02/2018   right forearm--in situ (tx p bx)   SCC (squamous cell carcinoma) 03/09/2019   left zy goma--well diff (tx p bx)   SCC (squamous cell carcinoma) 03/09/2019   right pre auricular--well diff (tx p bx)   SCCA (squamous cell carcinoma) of skin 05/22/2021   Left Buccal Cheek (in situ)   Stroke (HCC) 2003;  2013; 07/2015; 12/05/2015   denies residual on 12/10/2015, 12/13/16, 01/08/17   TIA (transient ischemic attack)    Type II diabetes mellitus (HCC)    UPPER RESPIRATORY INFECTION     Assessment: 89 YOM presenting with SOB, hx of afib on Xarelto PTA with last dose 10/21 @1800 , on hold for possible procedure.    PTT came back elevated at 130 this afternoon. It was collected appropriately. We will hold and decrease rate.  Goal of Therapy:  Heparin level 0.3-0.7 units/ml aPTT 66-102 seconds Monitor platelets by anticoagulation protocol: Yes   Plan:   Hold heparin x30 mins Decrease heparin to 1350 units/h F/u 8h aPTT  Ulyses Southward, PharmD, BCIDP, AAHIVP, CPP Infectious Disease Pharmacist 05/26/2023 1:21 PM

## 2023-05-26 NOTE — Progress Notes (Signed)
[facility]  PROGRESS NOTE  [name] AVW:[id number] DOB: [birth year] DOA: 05/24/2023 PCP: Dettinger, Elige Radon, MD   Brief HPI:   89 yo male with the past medical history of hypertension, hyperlipidemia, coronary artery disease, T2DM and atrial fibrillation who presented with aphasia.  Patient was found having difficulty finding words lasting for about 20 minutes.  Also had precordial chest pain.  Blood pressure was 138/73.  Troponin was elevated at 1131 and 1083.  EKG showed left axis deviation, showed atrial fibrillation with no significant ST changes.  CTA of the head and neck was positive for abnormal right vertebral artery, possible acute occlusion of slow retrograde flow.  Very severe stenosis of the right subclavian artery.  Brain MRI showed no acute intracranial abnormality.    Assessment/Plan:   TIA -Resolved; MRI negative for stroke -Neurology was consulted; recommended EEG to rule out seizure -EEG did not show epileptiform discharges -Continue Xarelto  NSTEMI -Presented with significantly elevated troponin, cardiology was consulted -No aggressive intervention planned as patient and family refused aggressive intervention -Will consult palliative care for further clarification of goals of care -Echocardiogram showed no regional wall motion abnormalities, EF 55 to 60%; grade 1 diastolic dysfunction -Currently on IV heparin  Chronic diastolic CHF -Echocardiogram shows grade 1 diastolic dysfunction,EF 55 to 82% -Diuresis on hold -Cardiology following  Moderate aortic valve stenosis -Echocardiogram shows severe calcification of the aortic valve -Aortic valve gradient 32 mmHg, aortic valve area 1.38 centimeters square -Patient not interested in TAVR  Hypertension -Blood pressure well-controlled -Continue metoprolol, benazepril  Paroxysmal atrial fibrillation -Heart rate controlled with metoprolol -Continue IV heparin for anticoagulation  Hyponatremia -Sodium  129 -Check serum and urine osmolality  Hypomagnesemia -Replete  Diabetes mellitus type 2 -Continue sliding scale insulin with NovoLog -CBG well-controlled   Goals of care -Will consult palliative care for goals of care  Medications     atorvastatin  80 mg Oral Daily   benazepril  20 mg Oral Daily   insulin aspart  0-6 Units Subcutaneous TID WC   metoprolol tartrate  100 mg Oral BID   mometasone-formoterol  2 puff Inhalation BID   pantoprazole  40 mg Oral Daily     Data Reviewed:   CBG:  Recent Labs  Lab 05/25/23 0753 05/25/23 1204 05/25/23 1558 05/25/23 2106 05/26/23 0612  GLUCAP 154* 145* 158* 166* 162*    SpO2: 97 %    Vitals:   05/25/23 2032 05/26/23 0500 05/26/23 0552 05/26/23 0700  BP:  138/77    Pulse:      Resp:      Temp:  97.6 F (36.4 C)  98 F (36.7 C)  TempSrc:  Oral  Oral  SpO2: 95% 97%    Weight:   83.2 kg   Height:          Data Reviewed:  Basic Metabolic Panel: Recent Labs  Lab 05/24/23 2126 05/25/23 0316 05/26/23 0216  NA 130* 129* 129*  K 3.9 3.6 3.7  CL 93* 93* 94*  CO2 23 23 25   GLUCOSE 194* 158* 147*  BUN 19 19 21   CREATININE 1.32* 1.42* 1.42*  CALCIUM 8.9 8.7* 8.7*  MG 1.4* 1.5* 2.1    CBC: Recent Labs  Lab 05/24/23 2126 05/25/23 0316 05/26/23 0216  WBC 9.2 8.0 7.2  NEUTROABS 6.8 5.2  --   HGB 13.0 11.9* 12.5*  HCT 36.8* 34.0* 35.2*  MCV 96.3 97.1 96.4  PLT 172 152 167    LFT Recent Labs  Lab  05/24/23 2126 05/25/23 0316  AST 34 28  ALT 21 18  ALKPHOS 73 64  BILITOT 1.3* 1.4*  PROT 6.8 6.0*  ALBUMIN 3.7 3.4*     Antibiotics: Anti-infectives (From admission, onward)    None        DVT prophylaxis: Heparin  Code Status: Full code  Family Communication: No family at bedside   CONSULTS cardiology, neurology   Subjective   Patient seen and examined, denies chest pain or shortness of breath.   Objective    Physical Examination:   General: Appears in no acute  distress Cardiovascular: S1-S2, regular Respiratory: Lungs clear to auscultation bilaterally Abdomen: Soft, nontender, no organomegaly Extremities: No edema in the lower extremities Neurologic: Alert, oriented x 3, no focal deficit noted   Status is: Inpatient:             Meredeth Ide   [facility] If 7PM-7AM, please contact night-coverage at [email or web address], Office  [phone]   05/26/2023, 8:55 AM  LOS: 1 day

## 2023-05-26 NOTE — TOC Initial Note (Signed)
Transition of Care (TOC) - Initial/Assessment Note   Spoke to patient and son Aries at bedside.   Patient confused with sitter at bedside.   Taiven is oldest son. Patient has two other sons Francee Piccolo ( wife Morrie Sheldon) and Jillyn Hidden.   Health Care Power of Gerrit Friends is Ziah's daughter Huntley Dec. Jaylee will have family bring in paperwork.   Patient from home with his wife. Wife has ALS.   PT recommending at present HHPT and bedside commode. Provided [place] with Medicare.gov list to start reviewing.   TOC Team will continue to follow Patient Details  Name: [name] MRN: [id number] Date of Birth: 1929/12/25  Transition of Care [facility]) CM/SW Contact:    Kingsley Plan, RN Phone Number: 05/26/2023, 12:02 PM  Clinical Narrative:                   Expected Discharge Plan: Home w Home Health Services Barriers to Discharge: Continued Medical Work up   Patient Goals and CMS Choice Patient states their goals for this hospitalization and ongoing recovery are:: to return to home CMS Medicare.gov Compare Post Acute Care list provided to:: Patient Represenative (must comment) (Also son Enver) Choice offered to / list presented to : Adult Children      Expected Discharge Plan and Services   Discharge Planning Services: CM Consult Post Acute Care Choice: Home Health, Durable Medical Equipment Living arrangements for the past 2 months: Single Family Home                 DME Arranged: 3-N-1                    Prior Living Arrangements/Services Living arrangements for the past 2 months: Single Family Home Lives with:: Spouse   Do you feel safe going back to the place where you live?: Yes      Need for Family Participation in Patient Care: Yes (Comment) Care giver support system in place?: Yes (comment)      Activities of Daily Living   ADL Screening (condition at time of admission) Independently performs ADLs?: No Does the patient have a NEW difficulty with  bathing/dressing/toileting/self-feeding that is expected to last >3 days?: Yes (Initiates electronic notice to provider for possible OT consult) (family helps with bathing and dressing) Does the patient have a NEW difficulty with getting in/out of bed, walking, or climbing stairs that is expected to last >3 days?: Yes (Initiates electronic notice to provider for possible PT consult) (ambulates with cane) Does the patient have a NEW difficulty with communication that is expected to last >3 days?: No Is the patient deaf or have difficulty hearing?: Yes Does the patient have difficulty seeing, even when wearing glasses/contacts?: No Does the patient have difficulty concentrating, remembering, or making decisions?: Yes  Permission Sought/Granted   Permission granted to share information with : Yes, Verbal Permission Granted  Share Information with NAME: Sons Lanell Matar , daughter in [place], grand daughter Huntley Dec           Emotional Assessment Appearance:: Appears stated age Attitude/Demeanor/Rapport: Engaged Affect (typically observed): Accepting Orientation: : Oriented to Self Alcohol / Substance Use: Not Applicable Psych Involvement: No (comment)  Admission diagnosis:  Burning in the chest [R07.89] NSTEMI (non-ST elevated myocardial infarction) [facility]) [I21.4] Transient neurological symptoms [R29.818] Patient Active Problem List   Diagnosis Date Noted   NSTEMI (non-ST elevated myocardial infarction) (HCC) 05/25/2023   Hypomagnesemia 05/25/2023   Chronic diastolic CHF (congestive heart failure) (HCC) 05/25/2023  Moderate persistent asthma 05/25/2023   Acute on chronic diastolic heart failure (HCC) 05/25/2023   Aortic valve stenosis, nonrheumatic 02/09/2022   SOB (shortness of breath) 02/09/2022   Paroxysmal atrial fibrillation (HCC) 01/19/2018   Hearing loss, sensorineural, asymmetrical 09/07/2017   History of CVA (cerebrovascular accident) 04/21/2017   PFO (patent foramen  ovale) 12/30/2016   Chronic kidney disease, stage 3a (HCC) 12/13/2016   GERD (gastroesophageal reflux disease) 02/25/2016   Malnutrition of moderate degree 12/11/2015   Type 2 diabetes mellitus with hyperlipidemia (HCC) 12/06/2015   Dysarthria 12/05/2015   TIA (transient ischemic attack) 09/12/2013   Chronic sinusitis 09/12/2013   Disease of nasal cavity and sinuses 09/11/2013   Pulmonary nodules 09/11/2013   Metabolic syndrome 08/01/2013   Vitamin D deficiency 08/01/2013   PVD (peripheral vascular disease) (HCC) 11/25/2011   ILIAC ARTERY ANEURYSM 07/17/2010   Hyperlipidemia 08/28/2009   Coronary artery disease involving native coronary artery of native heart with unstable angina pectoris (HCC) 08/28/2009   Essential hypertension 06/05/2009   Cerebral atherosclerosis 06/04/2009   PCP:  Dettinger, Elige Radon, MD Pharmacy:   [facility] [address], [place] - [address] [place] [address] Phone: [phone] Fax: [phone]  [id number] - [place], [place] - [address] [place] [address] Phone: [phone] Fax: [phone]     Social Determinants of Health (SDOH) Social History: SDOH Screenings   Food Insecurity: No Food Insecurity (05/25/2023)  Housing: Low Risk  (05/25/2023)  Transportation Needs: No Transportation Needs (05/25/2023)  Utilities: Not At Risk (05/25/2023)  Alcohol Screen: Low Risk  (01/07/2023)  Depression (PHQ2-9): Low Risk  (04/21/2023)  Financial Resource Strain: Low Risk  (01/07/2023)  Physical Activity: Insufficiently Active (01/07/2023)  Social Connections: Moderately Integrated (01/07/2023)  Stress: No Stress Concern Present (01/07/2023)  Tobacco Use: Medium Risk (05/25/2023)   SDOH Interventions:     Readmission Risk Interventions     No data to display

## 2023-05-27 DIAGNOSIS — Z7189 Other specified counseling: Secondary | ICD-10-CM

## 2023-05-27 DIAGNOSIS — G459 Transient cerebral ischemic attack, unspecified: Secondary | ICD-10-CM | POA: Diagnosis not present

## 2023-05-27 DIAGNOSIS — I5032 Chronic diastolic (congestive) heart failure: Secondary | ICD-10-CM | POA: Diagnosis not present

## 2023-05-27 DIAGNOSIS — R29818 Other symptoms and signs involving the nervous system: Secondary | ICD-10-CM

## 2023-05-27 DIAGNOSIS — I214 Non-ST elevation (NSTEMI) myocardial infarction: Secondary | ICD-10-CM | POA: Diagnosis not present

## 2023-05-27 DIAGNOSIS — Z515 Encounter for palliative care: Secondary | ICD-10-CM

## 2023-05-27 DIAGNOSIS — I10 Essential (primary) hypertension: Secondary | ICD-10-CM | POA: Diagnosis not present

## 2023-05-27 LAB — CBC
HCT: 40.9 % (ref 39.0–52.0)
Hemoglobin: 14.2 g/dL (ref 13.0–17.0)
MCH: 33.7 pg (ref 26.0–34.0)
MCHC: 34.7 g/dL (ref 30.0–36.0)
MCV: 97.1 fL (ref 80.0–100.0)
Platelets: 213 10*3/uL (ref 150–400)
RBC: 4.21 MIL/uL — ABNORMAL LOW (ref 4.22–5.81)
RDW: 12.7 % (ref 11.5–15.5)
WBC: 10.7 10*3/uL — ABNORMAL HIGH (ref 4.0–10.5)
nRBC: 0 % (ref 0.0–0.2)

## 2023-05-27 LAB — GLUCOSE, CAPILLARY
Glucose-Capillary: 166 mg/dL — ABNORMAL HIGH (ref 70–99)
Glucose-Capillary: 169 mg/dL — ABNORMAL HIGH (ref 70–99)
Glucose-Capillary: 133 mg/dL — ABNORMAL HIGH (ref 70–99)
Glucose-Capillary: 179 mg/dL — ABNORMAL HIGH (ref 70–99)

## 2023-05-27 LAB — HEPARIN LEVEL (UNFRACTIONATED): Heparin Unfractionated: >1.1 [IU]/mL — ABNORMAL HIGH (ref 0.30–0.70)

## 2023-05-27 LAB — APTT: aPTT: 174 s (ref 24–36)

## 2023-05-27 LAB — RPR: RPR Ser Ql: NONREACTIVE

## 2023-05-27 MED ORDER — METOPROLOL TARTRATE 50 MG PO TABS
50.0000 mg | ORAL_TABLET | Freq: Two times a day (BID) | ORAL | Status: DC
  Administered 2023-05-27 – 2023-05-28 (×3): 50 mg via ORAL
  Filled 2023-05-27 (×3): qty 1

## 2023-05-27 MED ORDER — RIVAROXABAN 15 MG PO TABS
15.0000 mg | ORAL_TABLET | Freq: Every day | ORAL | Status: DC
Start: 2023-05-27 — End: 2023-05-28
  Administered 2023-05-27: 15 mg via ORAL
  Filled 2023-05-27 (×2): qty 1

## 2023-05-27 MED ORDER — HEPARIN (PORCINE) 25000 UT/250ML-% IV SOLN: 850.0000 [IU]/h | INTRAVENOUS | Status: DC

## 2023-05-27 NOTE — Plan of Care (Signed)
  Problem: Education: Goal: Knowledge of disease or condition will improve Outcome: Not Progressing Goal: Knowledge of secondary prevention will improve (MUST DOCUMENT ALL) Outcome: Not Progressing Goal: Knowledge of patient specific risk factors will improve Loraine Leriche N/A or DELETE if not current risk factor) Outcome: Not Progressing   Problem: Ischemic Stroke/TIA Tissue Perfusion: Goal: Complications of ischemic stroke/TIA will be minimized Outcome: Progressing   Problem: Coping: Goal: Will verbalize positive feelings about self Outcome: Not Progressing Goal: Will identify appropriate support needs Outcome: Not Progressing   Problem: Health Behavior/Discharge Planning: Goal: Ability to manage health-related needs will improve Outcome: Not Progressing Goal: Goals will be collaboratively established with patient/family Outcome: Not Progressing   Problem: Self-Care: Goal: Ability to participate in self-care as condition permits will improve Outcome: Not Progressing Goal: Verbalization of feelings and concerns over difficulty with self-care will improve Outcome: Not Progressing Goal: Ability to communicate needs accurately will improve Outcome: Not Progressing   Problem: Nutrition: Goal: Risk of aspiration will decrease Outcome: Not Progressing Goal: Dietary intake will improve Outcome: Not Progressing   Problem: Education: Goal: Ability to describe self-care measures that may prevent or decrease complications (Diabetes Survival Skills Education) will improve Outcome: Not Progressing Goal: Individualized Educational Video(s) Outcome: Not Progressing   Problem: Coping: Goal: Ability to adjust to condition or change in health will improve Outcome: Not Progressing   Problem: Fluid Volume: Goal: Ability to maintain a balanced intake and output will improve Outcome: Not Progressing   Problem: Health Behavior/Discharge Planning: Goal: Ability to identify and utilize available  resources and services will improve Outcome: Not Progressing Goal: Ability to manage health-related needs will improve Outcome: Not Progressing   Problem: Metabolic: Goal: Ability to maintain appropriate glucose levels will improve Outcome: Not Progressing   Problem: Nutritional: Goal: Maintenance of adequate nutrition will improve Outcome: Not Progressing Goal: Progress toward achieving an optimal weight will improve Outcome: Not Progressing   Problem: Skin Integrity: Goal: Risk for impaired skin integrity will decrease Outcome: Not Progressing   Problem: Tissue Perfusion: Goal: Adequacy of tissue perfusion will improve Outcome: Not Progressing   Problem: Health Behavior/Discharge Planning: Goal: Ability to manage health-related needs will improve Outcome: Not Progressing   Problem: Clinical Measurements: Goal: Ability to maintain clinical measurements within normal limits will improve Outcome: Not Progressing Goal: Will remain free from infection Outcome: Not Progressing Goal: Diagnostic test results will improve Outcome: Not Progressing Goal: Respiratory complications will improve Outcome: Not Progressing Goal: Cardiovascular complication will be avoided Outcome: Not Progressing   Problem: Activity: Goal: Risk for activity intolerance will decrease Outcome: Not Progressing   Problem: Nutrition: Goal: Adequate nutrition will be maintained Outcome: Not Progressing   Problem: Coping: Goal: Level of anxiety will decrease Outcome: Not Progressing   Problem: Elimination: Goal: Will not experience complications related to bowel motility Outcome: Not Progressing Goal: Will not experience complications related to urinary retention Outcome: Not Progressing   Problem: Pain Management: Goal: General experience of comfort will improve Outcome: Not Progressing   Problem: Safety: Goal: Ability to remain free from injury will improve Outcome: Not Progressing

## 2023-05-27 NOTE — Progress Notes (Addendum)
Occupational Therapy Treatment Patient Details Name: [name] MRN: [id number] DOB: [birth year] Today's Date: 05/27/2023   History of present illness Pt is a 89 y.o. male presenting 10/21 with AMS and difficulty word finding that resolved within 20 mins. EKG showed left axis deviation, showed atrial fibrillation with no significant ST changes. CTA head/neck 10/21 was positive for abnormal right vertebral artery, possible acute occlusion of slow retrograde flow. EEG 10/22 suggestive of mild diffuse encephalopathy. PMH significant for essential HTN, HLD, CAD, type 2 diabetes mellitus, paroxysmal atrial fibrillation on Xarelto, chronic diastolic heart failure, severe aortic stenosis, chronic hyponatremia   OT comments  Pt progressing toward established OT goals. Focus session on standing balance and following commands. Pt brushing teeth at sink with CGA for safety; mod+ cues to follow up to 2 step commands this session. Pt pleasantly confused throughout needing re-orientation at beginning of session. Will continue to follow acutely and recommending HHOT to optimize safety and independence in ADL.       If plan is discharge home, recommend the following:  A little help with walking and/or transfers;A little help with bathing/dressing/bathroom;Assistance with cooking/housework;Direct supervision/assist for medications management;Direct supervision/assist for financial management;Assist for transportation;Help with stairs or ramp for entrance   Equipment Recommendations  BSC/3in1    Recommendations for Other Services      Precautions / Restrictions Precautions Precautions: Fall Precaution Comments: OK to mobilize within limit of symptoms (dyspnea or "indigestion") per cardiology Restrictions Weight Bearing Restrictions: No       Mobility Bed Mobility               General bed mobility comments: OOB in chair on arrival and departure    Transfers Overall transfer level: Needs  assistance Equipment used: Rolling walker (2 wheels) Transfers: Sit to/from Stand Sit to Stand: Contact guard assist           General transfer comment: increased time and initially attempts to pull on RW but switching to hands on arms of chair without cues.     Balance Overall balance assessment: Needs assistance Sitting-balance support: Feet supported, No upper extremity supported Sitting balance-Leahy Scale: Fair Sitting balance - Comments: sitting EOB   Standing balance support: Bilateral upper extremity supported, Reliant on assistive device for balance, During functional activity Standing balance-Leahy Scale: Poor Standing balance comment: with RW support                           ADL either performed or assessed with clinical judgement   ADL Overall ADL's : Needs assistance/impaired     Grooming: Contact guard assist;Standing               Lower Body Dressing: Set up;Sitting/lateral leans Lower Body Dressing Details (indicate cue type and reason): to pull up socks. Able to perform figure 4             Functional mobility during ADLs: Contact guard assist;Rolling walker (2 wheels) General ADL Comments: following one step commands; difficulty with 2 step commands requiring direct cues for next command. pt pleasantly fonfused throughout    Extremity/Trunk Assessment Upper Extremity Assessment Upper Extremity Assessment: Generalized weakness   Lower Extremity Assessment Lower Extremity Assessment: Defer to PT evaluation        Vision   Vision Assessment?: No apparent visual deficits   Perception     Praxis      Cognition Arousal: Alert Behavior During Therapy: [facility] for tasks assessed/performed Overall Cognitive  Status: Impaired/Different from baseline Area of Impairment: Orientation, Memory, Following commands, Safety/judgement, Problem solving, Attention, Awareness                 Orientation Level: Disoriented to, Time,  Situation (aware he is in the hospital, but generally confused with symptoms of delirium (5pm; believes mother is coming by, etc)) Current Attention Level: Sustained Memory: Decreased short-term memory Following Commands: Follows one step commands with increased time Safety/Judgement: Decreased awareness of deficits, Decreased awareness of safety Awareness: Intellectual Problem Solving: Slow processing, Requires verbal cues, Requires tactile cues General Comments: Pt confused throughout session and asking about his mother. Pt able to follow cues and be redirected. distracted by lines when attempting to turn to chair        Exercises      Shoulder Instructions       General Comments      Pertinent Vitals/ Pain       Pain Assessment Pain Assessment: No/denies pain  Home Living                                          Prior Functioning/Environment              Frequency  Min 1X/week        Progress Toward Goals  OT Goals(current goals can now be found in the care plan section)  Progress towards OT goals: Progressing toward goals  Acute Rehab OT Goals Patient Stated Goal: get better OT Goal Formulation: With patient Time For Goal Achievement: 06/08/23 Potential to Achieve Goals: Good ADL Goals Pt Will Perform Grooming: with supervision;standing Pt Will Perform Lower Body Dressing: with supervision;sit to/from stand Pt Will Transfer to Toilet: with supervision;ambulating Additional ADL Goal #1: Pt will follow 2 step commands during ADL without additional cueing.  Plan      Co-evaluation                 AM-PAC OT "6 Clicks" Daily Activity     Outcome Measure   Help from another person eating meals?: A Little Help from another person taking care of personal grooming?: A Little Help from another person toileting, which includes using toliet, bedpan, or urinal?: A Little Help from another person bathing (including washing, rinsing,  drying)?: A Little Help from another person to put on and taking off regular upper body clothing?: A Little Help from another person to put on and taking off regular lower body clothing?: A Little 6 Click Score: 18    End of Session Equipment Utilized During Treatment: Gait belt;Rolling walker (2 wheels)  OT Visit Diagnosis: Unsteadiness on feet (R26.81);Muscle weakness (generalized) (M62.81);Other symptoms and signs involving cognitive function;Cognitive communication deficit (R41.841)   Activity Tolerance Patient tolerated treatment well   Patient Left in chair;with call bell/phone within reach;with nursing/sitter in room Psychiatrist)   Nurse Communication Mobility status        Time: [phone] OT Time Calculation (min): 17 min  Charges: OT General Charges $OT Visit: 1 Visit OT Treatments $Self Care/Home Management : 8-22 mins  Tyler Deis, OTR/L [facility] Acute Rehabilitation Office: [phone]   Myrla Halsted 05/27/2023, 12:58 PM

## 2023-05-27 NOTE — Progress Notes (Signed)
   Patient Name: [name] Date of Encounter: 05/27/2023 [facility] Cardiologist: Rollene Rotunda, MD   Interval Summary  .    Alert this morning, more oriented. Shortness of breath is "no worse than usual".  Denies angina. Blood pressure was elevated when he was agitated, but quite low this morning Echocardiogram was interpreted as showing moderate aortic stenosis.  On my review I do not think this is an accurate assessment.  (The left ventricular outflow tract pulsed Doppler sample volume was placed incorrectly which grossly overestimates the stroke-volume and subsequently underestimates the severity of aortic stenosis; in addition the LVOT diameter was markedly overestimated at 2.4 cm compared to 2.1 cm on his previous study). He does have severe aortic stenosis.  Vital Signs .    Vitals:   05/26/23 1914 05/26/23 2302 05/27/23 0306 05/27/23 0711  BP: (!) 157/95 122/76 116/71 (!) 95/58  Pulse:  67 70   Resp: 20 20 16 19   Temp: 97.8 F (36.6 C) 98 F (36.7 C) 98.3 F (36.8 C) 98.3 F (36.8 C)  TempSrc: Oral Axillary Axillary Oral  SpO2:  94% 93% 94%  Weight:   81.3 kg   Height:        Intake/Output Summary (Last 24 hours) at 05/27/2023 0821 Last data filed at 05/27/2023 0550 Gross per 24 hour  Intake 696.76 ml  Output 450 ml  Net 246.76 ml      05/27/2023    3:06 AM 05/26/2023    5:52 AM 05/25/2023    2:20 PM  Last 3 Weights  Weight (lbs) 179 lb 3.7 oz 183 lb 6.8 oz 200 lb  Weight (kg) 81.3 kg 83.2 kg 90.719 kg      Telemetry/ECG    Sinus rhythm/sinus bradycardia- Personally Reviewed  Physical Exam .   GEN: No acute distress.   Neck: No JVD, bilateral carotid bruits radiating from chest Cardiac: RRR, absent second heart sound, 3/6 late peaking systolic ejection murmur no diastolic murmurs, rubs, or gallops.  Respiratory: Clear to auscultation bilaterally. GI: Soft, nontender, non-distended  MS: No edema  Assessment & Plan .     He has not  had any symptoms of ongoing coronary insufficiency.  Will stop IV heparin and transition back to oral anticoagulant.  If he does not have any bleeding problems it is reasonable to also continue aspirin 81 mg daily at least for the time being.  On my review of his echocardiogram he does have severe aortic stenosis (paradoxical low-flow, low gradient).  He does not want undergo any invasive procedures including cardiac catheterization or TAVR.  Will reduce the dose of metoprolol since he has a fixed stroke-volume.  If necessary for blood pressure control can increase the dose of his ACE inhibitor.  Recommend palliative care evaluation.  For questions or updates, please contact  [facility] Please consult [email or web address] for contact info under        Signed, Thurmon Fair, MD

## 2023-05-27 NOTE — Progress Notes (Signed)
[facility]  PROGRESS NOTE  [name]:[id number] DOB: 01-28-1930 DOA: 05/24/2023 PCP: Dettinger, Elige Radon, MD   Brief HPI:   89 yo male with the past medical history of hypertension, hyperlipidemia, coronary artery disease, T2DM and atrial fibrillation who presented with aphasia.  Patient was found having difficulty finding words lasting for about 20 minutes.  Also had precordial chest pain.  Blood pressure was 138/73.  Troponin was elevated at 1131 and 1083.  EKG showed left axis deviation, showed atrial fibrillation with no significant ST changes.  CTA of the head and neck was positive for abnormal right vertebral artery, possible acute occlusion of slow retrograde flow.  Very severe stenosis of the right subclavian artery.  Brain MRI showed no acute intracranial abnormality.    Assessment/Plan:   TIA -Resolved; MRI negative for stroke -Neurology was consulted; recommended EEG to rule out seizure -EEG did not show epileptiform discharges -Continue Xarelto  NSTEMI -Presented with significantly elevated troponin, cardiology was consulted -No aggressive intervention planned as patient and family refused aggressive intervention -Will consult palliative care for further clarification of goals of care -Echocardiogram showed no regional wall motion abnormalities, EF 55 to 60%; grade 1 diastolic dysfunction -Currently on IV heparin; IV heparin we will switch to Xarelto -Cardiology recommends to continue with aspirin 81 mg , if he does not have bleeding problems  Chronic diastolic CHF -Echocardiogram shows grade 1 diastolic dysfunction,EF 55 to 95% -Diuresis on hold -Cardiology following  Moderate aortic valve stenosis -Echocardiogram shows severe calcification of the aortic valve -Aortic valve gradient 32 mmHg, aortic valve area 1.38 centimeters square -Patient not interested in TAVR  Hypertension -Blood pressure well-controlled -Continue metoprolol,  benazepril  Paroxysmal atrial fibrillation -Heart rate controlled with metoprolol -Will be ordered on IV heparin, will switch to Xarelto  Hyponatremia -Sodium 129 -Serum osmolality 294 -Urine osmolality 335  Hypomagnesemia -Replete  Diabetes mellitus type 2 -Continue sliding scale insulin with NovoLog -CBG well-controlled   Goals of care -Will consult palliative care for goals of care  Medications     atorvastatin  80 mg Oral Daily   benazepril  20 mg Oral Daily   insulin aspart  0-6 Units Subcutaneous TID WC   metoprolol tartrate  50 mg Oral BID   mometasone-formoterol  2 puff Inhalation BID   pantoprazole  40 mg Oral Daily     Data Reviewed:   CBG:  Recent Labs  Lab 05/26/23 0612 05/26/23 1113 05/26/23 1628 05/26/23 2119 05/27/23 0600  GLUCAP 162* 192* 182* 159* 166*    SpO2: 98 %    Vitals:   05/26/23 2302 05/27/23 0306 05/27/23 0711 05/27/23 0830  BP: 122/76 116/71 (!) 95/58   Pulse: 67 70    Resp: 20 16 19    Temp: 98 F (36.7 C) 98.3 F (36.8 C) 98.3 F (36.8 C)   TempSrc: Axillary Axillary Oral   SpO2: 94% 93% 94% 98%  Weight:  81.3 kg    Height:          Data Reviewed:  Basic Metabolic Panel: Recent Labs  Lab 05/24/23 2126 05/25/23 0316 05/26/23 0216  NA 130* 129* 129*  K 3.9 3.6 3.7  CL 93* 93* 94*  CO2 23 23 25   GLUCOSE 194* 158* 147*  BUN 19 19 21   CREATININE 1.32* 1.42* 1.42*  CALCIUM 8.9 8.7* 8.7*  MG 1.4* 1.5* 2.1    CBC: Recent Labs  Lab 05/24/23 2126 05/25/23 0316 05/26/23 0216 05/26/23 1547  WBC 9.2 8.0 7.2  6.9  NEUTROABS 6.8 5.2  --   --   HGB 13.0 11.9* 12.5* 12.9*  HCT 36.8* 34.0* 35.2* 37.1*  MCV 96.3 97.1 96.4 95.4  PLT 172 152 167 160    LFT Recent Labs  Lab 05/24/23 2126 05/25/23 0316  AST 34 28  ALT 21 18  ALKPHOS 73 64  BILITOT 1.3* 1.4*  PROT 6.8 6.0*  ALBUMIN 3.7 3.4*     Antibiotics: Anti-infectives (From admission, onward)    None        DVT prophylaxis:  Heparin  Code Status: Full code  Family Communication: No family at bedside   CONSULTS cardiology, neurology   Subjective   Denies chest pain or shortness of breath.  Objective    Physical Examination:  General-appears in no acute distress Heart-S1-S2, regular, no murmur auscultated Lungs-clear to auscultation bilaterally, no wheezing or crackles auscultated Abdomen-soft, nontender, no organomegaly Extremities-no edema in the lower extremities Neuro-alert, oriented x3, no focal deficit noted   Status is: Inpatient:             Meredeth Ide   [facility] If 7PM-7AM, please contact night-coverage at [email or web address], Office  [phone]   05/27/2023, 9:07 AM  LOS: 2 days

## 2023-05-27 NOTE — Plan of Care (Signed)
  Problem: Coping: Goal: Will verbalize positive feelings about self Outcome: Progressing Goal: Will identify appropriate support needs Outcome: Progressing   Problem: Nutrition: Goal: Risk of aspiration will decrease Outcome: Progressing Goal: Dietary intake will improve Outcome: Progressing   Problem: Coping: Goal: Ability to adjust to condition or change in health will improve Outcome: Progressing

## 2023-05-27 NOTE — Progress Notes (Addendum)
PHARMACY - ANTICOAGULATION CONSULT NOTE  Pharmacy Consult for heparin Indication: Afib/ACS  Allergies  Allergen Reactions   Alteplase Anaphylaxis   Codeine Other (See Comments)    Bad dreams   Diclofenac Sodium Diarrhea   Gemfibrozil Nausea Only    Other Reaction(s): GI Intolerance   Voltaren [Diclofenac Sodium] Diarrhea    Patient Measurements: Height: 5\' 11"  (180.3 cm) Weight: 81.3 kg (179 lb 3.7 oz) IBW/kg (Calculated) : 75.3 Heparin Dosing Weight: TBW  Vital Signs: Temp: 98.3 F (36.8 C) (10/24 1115) Temp Source: Oral (10/24 1115) BP: 102/50 (10/24 1115) Pulse Rate: 70 (10/24 0306)  Labs: Recent Labs    05/24/23 2126 05/24/23 2345 05/25/23 0316 05/25/23 0316 05/26/23 0216 05/26/23 1137 05/26/23 1547 05/26/23 2235 05/27/23 0945  HGB 13.0  --  11.9*  --  12.5*  --  12.9*  --  14.2  HCT 36.8*  --  34.0*  --  35.2*  --  37.1*  --  40.9  PLT 172  --  152  --  167  --  160  --  213  APTT  --   --   --    < > 32 130*  --  >200* 174*  HEPARINUNFRC  --   --   --   --  >1.10*  --   --   --  >1.10*  CREATININE 1.32*  --  1.42*  --  1.42*  --   --   --   --   TROPONINIHS 1,131* 1,083*  --   --   --   --   --   --   --    < > = values in this interval not displayed.    Estimated Creatinine Clearance: 36.1 mL/min (A) (by C-G formula based on SCr of 1.42 mg/dL (H)).   Medical History: Past Medical History:  Diagnosis Date   Arthritis    "wrists, knees, back" (12/10/2015)   Asthma    BCC (basal cell carcinoma of skin) 01/28/2017   left inner cheek--sup & nod (CX3, 5FU)   BCC (basal cell carcinoma) 01/26/2017   left chin (molts)   BCC (basal cell carcinoma) 04/01/2017   midback--sup & nod (tx p bx)   BCC (basal cell carcinoma) 10/13/2017   above left outer eye brow--infiltrative (CX3, 5FU)   BCC (basal cell carcinoma) 06/02/2018   right post forearm--nod (tx p bx)   BCC (basal cell carcinoma) 03/09/2019   right mid outer cheek--nod--(tx p bx)   Cerebral  atherosclerosis    Chronic back pain    "lower primarily" (12/10/2015)   CORONARY ATHEROSCLEROSIS NATIVE CORONARY ARTERY    Myocardial infarction at [facility] in 1996 with a directional atherectomy of the 90% LAD stenosis and medical management of 60-70% proximal circumflex and 50% RCA stenosis.   DYSLIPIDEMIA    GERD (gastroesophageal reflux disease)    History of hiatal hernia    HYPERTENSION    ILIAC ARTERY ANEURYSM    Myocardial infarction (HCC) 1996   SCC (squamous cell carcinoma) 10/29/2016   left ear--in situ (tx p bx)   SCC (squamous cell carcinoma) 01/26/2017   left sideburn--well diff--(CX3, 5FU)   SCC (squamous cell carcinoma) 01/28/2017   left upper cheek--in situ (CX3, 5FU)   SCC (squamous cell carcinoma) 05/06/2017   left cheek--sup &well diff (tx p bx)   SCC (squamous cell carcinoma) 10/13/2017   right outer forearm--in situ (CX3 5FU)   SCC (squamous cell carcinoma) 10/13/2017   right ouer forarm--well  diff (CX3 5FU)   SCC (squamous cell carcinoma) 10/13/2017   left sideburn, superior--well diff (CX3, 5FU)   SCC (squamous cell carcinoma) 06/02/2018   right forearm--in situ (tx p bx)   SCC (squamous cell carcinoma) 03/09/2019   left zy goma--well diff (tx p bx)   SCC (squamous cell carcinoma) 03/09/2019   right pre auricular--well diff (tx p bx)   SCCA (squamous cell carcinoma) of skin 05/22/2021   Left Buccal Cheek (in situ)   Stroke (HCC) 2003; 2013; 07/2015; 12/05/2015   denies residual on 12/10/2015, 12/13/16, 01/08/17   TIA (transient ischemic attack)    Type II diabetes mellitus (HCC)    UPPER RESPIRATORY INFECTION     Assessment: 46 YOM presenting with SOB, hx of afib on Xarelto PTA with last dose 10/21 @1800 , on hold for possible procedure.    aPTT came back elevated at 174, heparin level >1.1, on 1000 units/hr. Confirmed with lab, drawn from opposite arm. Bleeding stable - had previously bleeding at skin tear. No infusion issues.   Goal of Therapy:   Heparin level 0.3-0.7 units/ml aPTT 66-102 seconds Monitor platelets by anticoagulation protocol: Yes   Plan:  Hold heparin x 1.5 hrs Decrease heparin infusion to 850 units/h F/u 8h aPTT Monitor daily levels, CBC, and for s/sx of bleeding   Thank you for allowing pharmacy to participate in this patient's care,  Sherron Monday, PharmD, BCCCP Clinical Pharmacist  Phone: [phone] 05/27/2023 1:21 PM  Please check AMION for all [facility] Pharmacy phone numbers After 10:00 PM, call Main Pharmacy [phone]  [facility] Okay with medical teams to change back to DOAC given no further procedures planned. Will start Xarelto 15 mg today (on 20 mg PTA but TBW CrCl 38 mL/min so will monitor trend) and not restart heparin infusion. Monitor CBC and for s/sx of bleeding.   Thank you for allowing pharmacy to participate in this patient's care,  Sherron Monday, PharmD, BCCCP Clinical Pharmacist

## 2023-05-27 NOTE — Consult Note (Signed)
Consultation Note Date: 05/27/2023   Patient Name: [name]  DOB: 07-02-1930  MRN: [id number]  Age / Sex: 89 y.o., male  PCP: Dettinger, Elige Radon, MD Referring Physician: Meredeth Ide, MD  Reason for Consultation: Establishing goals of care  HPI/Patient Profile: 89 y.o. male  with past medical history of essential hypertension, hyperlipidemia, coronary disease status post MI in 1996 at which time he underwent angioplasty, type 2 diabetes mellitus, paroxysmal atrial fibrillation chronically anticoagulated on Xarelto, chronic diastolic heart failure, severe aortic stenosis, chronic hyponatremia with baseline serum sodium 131-135 admitted on 05/24/2023 with aphasia, substernal chest burning.   In the ED, EKG findings revealed NSTEMI and cardiology was consulted.  Patient declined heart catheterization, preferred medical management.  Neurology was also consulted for concern of TIA.  PMT has been consulted to assist with goals of care conversation.  Clinical Assessment and Goals of Care:  I have reviewed medical records including EPIC notes, labs and imaging, discussed with RN, assessed the patient and then met at the bedside with patient's son Lotanna to discuss diagnosis prognosis, GOC, EOL wishes, disposition and options.  I introduced Palliative Medicine as specialized medical care for people living with serious illness. It focuses on providing relief from the symptoms and stress of a serious illness. The goal is to improve quality of life for both the patient and the family.  We discussed a brief life review of the patient and then focused on their current illness.  The natural disease trajectory and expectations at EOL were discussed.  I attempted to elicit values and goals of care important to the patient.    Medical History Review and Understanding:  We discussed patient's acute illness in the context of  his chronic comorbidities and overall health.  Social History: Patient has 3 sons who alternate taking care of him in addition to a caregiver who assists 8-4 on Monday, Wednesday, and Thursday.  He lives with his wife, who has ALS and is quite sick herself.  He is also well supported by his granddaughter/HCPOA Maralyn Sago, who is an Charity fundraiser and provides assistance mostly on Friday/weekends.  Functional and Nutritional State: Patient's son reports he has been gradually declining. He has tried PT in the past and states it is not worth it because it does not help him. Albumin of 3.4 noted.   Advance Directives: A detailed discussion regarding advanced directives was had.  Son reports that his daughter/patient's granddaughter Maralyn Sago is HCPOA.  No documentation currently on file.   Code Status: Concepts specific to code status, artifical feeding and hydration, and rehospitalization were considered and discussed. Recommended consideration of DNR status, understanding evidenced-based poor outcomes in similar hospitalized patients, as the cause of the arrest is likely associated with chronic/terminal disease rather than a reversible acute cardio-pulmonary event.   Discussion: Patient's son shares that the family is currently attempting to arrange for 2 additional caregivers for patient's return home, which is the top priority at this time. He also shares that patient has been very clear on his readiness to have a natural death when it is his time and preference to avoid any further aggressive interventions. Given the above, provided recommendations for consideration of hospice at home.  Reviewed the resources available in detail.  Patient's son shared that the family typically will meet together to come to a shared decision.  Unfortunately, patient's granddaughter/HCPOA has had a loss in her husband's family today and he may be difficult to do this as soon as he would  like.  He will attempt to soon as possible,  possibly holding a phone call if they need to.  He is appreciative of PMT reaching back out in a couple of days to see how conversations have been going and assist with further conversation as needed.  He agrees that hospice seems to be more appropriate at this time rather than limited palliative care services.   The difference between aggressive medical intervention and comfort care was considered in light of the patient's goals of care. Hospice and Palliative Care services outpatient were explained and offered.   Discussed the importance of continued conversation with family and the medical providers regarding overall plan of care and treatment options, ensuring decisions are within the context of the patient's values and GOCs.   Questions and concerns were addressed.  Hard Choices booklet left for review. The family was encouraged to call with questions or concerns.  PMT will continue to support holistically.   SUMMARY OF RECOMMENDATIONS   -Continue full code for now.  Family would like to discuss DNR together before updating CODE STATUS -Continue current care, no escalation and no aggressive interventions -Patient's family is leaning towards hospice at home, would like to discuss further together before final decision -Patient/family not interested in home health PT/OT.  TOC and MD notified -Ongoing goals of care discussions -Psychosocial and emotional support provided -PMT will continue to follow and support.  Follow-up on 10/26 to continue discussions unless called sooner  Prognosis:  < 6 months  Discharge Planning: To Be Determined      Primary Diagnoses: Present on Admission:  NSTEMI (non-ST elevated myocardial infarction) (HCC)  TIA (transient ischemic attack)  Essential hypertension  Paroxysmal atrial fibrillation (HCC)  Chronic diastolic CHF (congestive heart failure) (HCC)  Chronic kidney disease, stage 3a (HCC)   Physical Exam Vitals and nursing note reviewed.   Constitutional:      General: He is not in acute distress.    Appearance: He is ill-appearing.     Comments: Sitter present  Cardiovascular:     Rate and Rhythm: Normal rate.  Pulmonary:     Effort: Pulmonary effort is normal. No respiratory distress.  Neurological:     Mental Status: He is alert.    Vital Signs: BP (!) 95/58 (BP Location: Right Arm)   Pulse 70   Temp 98.3 F (36.8 C) (Oral)   Resp 19   Ht 5\' 11"  (1.803 m)   Wt 81.3 kg   SpO2 98%   BMI 25.00 kg/m  Pain Scale: 0-10   Pain Score: 0-No pain   SpO2: SpO2: 98 % O2 Device:SpO2: 98 % O2 Flow Rate: .     Total time: I spent 75 minutes in the care of the patient today in the above activities and documenting the encounter.  MDM: High    Haroon Shatto Jeni Salles, PA-C  Palliative Medicine Team Team phone # [phone]  Thank you for allowing the Palliative Medicine Team to assist in the care of this patient. Please utilize secure chat with additional questions, if there is no response within 30 minutes please call the above phone number.  Palliative Medicine Team providers are available by phone from 7am to 7pm daily and can be reached through the team cell phone.  Should this patient require assistance outside of these hours, please call the patient's attending physician.

## 2023-05-27 NOTE — Progress Notes (Signed)
Physical Therapy Treatment Patient Details Name: [name] MRN: [id number] DOB: May 03, 1930 Today's Date: 05/27/2023   History of Present Illness Patient is a 89 year old male with episode of word finding difficulty that resolved, elevated troponin being admitted for evaluation of NSTEMI.    PT Comments  Pt received in supine and agreeable to session. Pt with impaired cognition and confusion throughout session requiring increased cues and redirection. Pt demonstrates increased difficulty standing from lower surface requiring use of momentum. Pt able to tolerate increased gait distance with intermittent min A due to reduced awareness and drifting. Pt reporting fatigue, but doesn't demonstrate any significant DOE. Pt continues to benefit from PT services to progress toward functional mobility goals.     If plan is discharge home, recommend the following: A little help with walking and/or transfers;A little help with bathing/dressing/bathroom;Help with stairs or ramp for entrance;Assist for transportation;Assistance with cooking/housework;Direct supervision/assist for financial management;Direct supervision/assist for medications management   Can travel by private vehicle        Equipment Recommendations  None recommended by PT    Recommendations for Other Services       Precautions / Restrictions Precautions Precautions: Fall Precaution Comments: OK to mobilize within limit of symptoms (dyspnea or "indigestion") per cardiology Restrictions Weight Bearing Restrictions: No     Mobility  Bed Mobility Overal bed mobility: Needs Assistance Bed Mobility: Supine to Sit     Supine to sit: Contact guard, HOB elevated     General bed mobility comments: increased time required    Transfers Overall transfer level: Needs assistance Equipment used: Rolling walker (2 wheels) Transfers: Sit to/from Stand Sit to Stand: Contact guard assist           General transfer comment: use  of momentum and multiple attempts, but able to complete without assist. Pt pulling up on RW despite cues    Ambulation/Gait Ambulation/Gait assistance: Min assist, Contact guard assist Gait Distance (Feet): 110 Feet Assistive device: Rolling walker (2 wheels) Gait Pattern/deviations: Wide base of support, Step-through pattern, Drifts right/left       General Gait Details: Cues for slower pacing, RW proximity, and awareness due to intermittent L drifting. Intermittent min A for RW management and balance.      Balance Overall balance assessment: Needs assistance Sitting-balance support: Feet supported, No upper extremity supported Sitting balance-Leahy Scale: Fair Sitting balance - Comments: sitting EOB   Standing balance support: Bilateral upper extremity supported, Reliant on assistive device for balance, During functional activity Standing balance-Leahy Scale: Poor Standing balance comment: with RW support                            Cognition Arousal: Alert Behavior During Therapy: WFL for tasks assessed/performed Overall Cognitive Status: Impaired/Different from baseline                                 General Comments: Pt confused throughout session and asking about his mother. Pt able to follow cues and be redirected        Exercises      General Comments        Pertinent Vitals/Pain Pain Assessment Pain Assessment: No/denies pain     PT Goals (current goals can now be found in the care plan section) Acute Rehab PT Goals Patient Stated Goal: to go home as soon as possible PT Goal Formulation: With patient/family  Time For Goal Achievement: 06/01/23 Progress towards PT goals: Progressing toward goals    Frequency    Min 1X/week       AM-PAC PT "6 Clicks" Mobility   Outcome Measure  Help needed turning from your back to your side while in a flat bed without using bedrails?: None Help needed moving from lying on your back to  sitting on the side of a flat bed without using bedrails?: A Little Help needed moving to and from a bed to a chair (including a wheelchair)?: A Little Help needed standing up from a chair using your arms (e.g., wheelchair or bedside chair)?: A Little Help needed to walk in hospital room?: A Little Help needed climbing 3-5 steps with a railing? : A Little 6 Click Score: 19    End of Session Equipment Utilized During Treatment: Gait belt Activity Tolerance: Patient tolerated treatment well Patient left: with call bell/phone within reach;in chair;with nursing/sitter in room Nurse Communication: Mobility status PT Visit Diagnosis: Unsteadiness on feet (R26.81);Muscle weakness (generalized) (M62.81)     Time: 4403-4742 PT Time Calculation (min) (ACUTE ONLY): 14 min  Charges:    $Gait Training: 8-22 mins PT General Charges $$ ACUTE PT VISIT: 1 Visit                    Johny Shock, PTA Acute Rehabilitation Services Secure Chat Preferred  [phone]    Johny Shock 05/27/2023, 10:24 AM

## 2023-05-28 DIAGNOSIS — R0789 Other chest pain: Secondary | ICD-10-CM | POA: Diagnosis not present

## 2023-05-28 DIAGNOSIS — I10 Essential (primary) hypertension: Secondary | ICD-10-CM | POA: Diagnosis not present

## 2023-05-28 DIAGNOSIS — R29818 Other symptoms and signs involving the nervous system: Secondary | ICD-10-CM | POA: Diagnosis not present

## 2023-05-28 DIAGNOSIS — Z789 Other specified health status: Secondary | ICD-10-CM

## 2023-05-28 DIAGNOSIS — I214 Non-ST elevation (NSTEMI) myocardial infarction: Secondary | ICD-10-CM | POA: Diagnosis not present

## 2023-05-28 DIAGNOSIS — I5032 Chronic diastolic (congestive) heart failure: Secondary | ICD-10-CM | POA: Diagnosis not present

## 2023-05-28 DIAGNOSIS — G459 Transient cerebral ischemic attack, unspecified: Secondary | ICD-10-CM | POA: Diagnosis not present

## 2023-05-28 LAB — BASIC METABOLIC PANEL
Anion gap: 11 (ref 5–15)
BUN: 27 mg/dL — ABNORMAL HIGH (ref 8–23)
CO2: 26 mmol/L (ref 22–32)
Calcium: 8.9 mg/dL (ref 8.9–10.3)
Chloride: 95 mmol/L — ABNORMAL LOW (ref 98–111)
Creatinine, Ser: 1.53 mg/dL — ABNORMAL HIGH (ref 0.61–1.24)
GFR, Estimated: 43 mL/min — ABNORMAL LOW (ref >60)
Glucose, Bld: 164 mg/dL — ABNORMAL HIGH (ref 70–99)
Potassium: 3.8 mmol/L (ref 3.5–5.1)
Sodium: 132 mmol/L — ABNORMAL LOW (ref 135–145)

## 2023-05-28 LAB — GLUCOSE, CAPILLARY
Glucose-Capillary: 163 mg/dL — ABNORMAL HIGH (ref 70–99)
Glucose-Capillary: 195 mg/dL — ABNORMAL HIGH (ref 70–99)

## 2023-05-28 LAB — CBC
HCT: 34 % — ABNORMAL LOW (ref 39.0–52.0)
Hemoglobin: 11.8 g/dL — ABNORMAL LOW (ref 13.0–17.0)
MCH: 33.5 pg (ref 26.0–34.0)
MCHC: 34.7 g/dL (ref 30.0–36.0)
MCV: 96.6 fL (ref 80.0–100.0)
Platelets: 177 10*3/uL (ref 150–400)
RBC: 3.52 MIL/uL — ABNORMAL LOW (ref 4.22–5.81)
RDW: 12.9 % (ref 11.5–15.5)
WBC: 9.2 10*3/uL (ref 4.0–10.5)
nRBC: 0 % (ref 0.0–0.2)

## 2023-05-28 LAB — MAGNESIUM: Magnesium: 1.8 mg/dL (ref 1.7–2.4)

## 2023-05-28 MED ORDER — METOPROLOL TARTRATE 50 MG PO TABS: 50.0000 mg | ORAL_TABLET | Freq: Two times a day (BID) | ORAL | 2 refills | Status: DC

## 2023-05-28 MED ORDER — RIVAROXABAN 15 MG PO TABS: 15.0000 mg | ORAL_TABLET | Freq: Every day | ORAL | 1 refills | Status: DC

## 2023-05-28 MED ORDER — ASPIRIN 81 MG PO TBEC: 81.0000 mg | DELAYED_RELEASE_TABLET | Freq: Every day | ORAL | 2 refills | Status: DC

## 2023-05-28 MED ORDER — ORAL CARE MOUTH RINSE: 15.0000 mL | OROMUCOSAL | Status: DC | PRN

## 2023-05-28 NOTE — Progress Notes (Signed)
Mobility Specialist Progress Note:   05/28/23 0900  Mobility  Activity Ambulated with assistance in hallway  Level of Assistance Contact guard assist, steadying assist  Assistive Device Front wheel walker  Distance Ambulated (ft) 100 ft  Activity Response Tolerated well  Mobility Referral Yes  $Mobility charge 1 Mobility  Mobility Specialist Start Time (ACUTE ONLY) 0905  Mobility Specialist Stop Time (ACUTE ONLY) 0917  Mobility Specialist Time Calculation (min) (ACUTE ONLY) 12 min    Pre Mobility: 78 HR During Mobility: 90 HR Post Mobility:  89 HR  Pt received in bed, agreeable to mobility. Asymptomatic throughout w/ no complaints. Pt left in bed with call bell and all needs met. Bed alarm on.  [name] Mobility Specialist Please contact via Special educational needs teacher or Rehab office at [phone]

## 2023-05-28 NOTE — Progress Notes (Signed)
Patient much more alert and oriented this morning. Patient knows he's in the hospital and states he needs to get back home to his wife that has ALS. Patient stated that yesterday evening "I thought I was at home last night, but I'm in the hospital".

## 2023-05-28 NOTE — Progress Notes (Signed)
Daily Progress Note   Patient Name: [name]       Date: 05/28/2023 DOB: November 09, 1929  Age: 89 y.o. MRN#: [id number] Attending Physician: Meredeth Ide, MD Primary Care Physician: Dettinger, Elige Radon, MD Admit Date: 05/24/2023  Reason for Consultation/Follow-up: Establishing goals of care  Subjective: Notified by RN that patient's daughter in law/Karen was concerned about patient's discharge being held for decisions regarding palliative care vs hospice and was requesting a phone call.  I have reviewed medical records including EPIC notes, MAR, and labs.   12:35 PM Called DIL/Karen and spoke with her and patient's son via speakerphone. Clydie Braun expressed concern regarding information given to them from RN - patient's discharge was on hold until they decided on outpatient palliative care vs hospice. Family indicate they are unsure if they are ready to make a decision for either service at this time but are also ready for him to return home with caregivers.   Per family request, education was provided on the difference between Palliative and Hospice care. Palliative care and hospice have similar goals of managing symptoms, promoting comfort, improving quality of life, and maintaining a person's dignity. However, palliative care may be offered during any phase of a serious illness, while hospice care is usually offered when a person is expected to live for 6 months or less.  Discussed options of initiating referrals after discharge vs initiating outpatient Palliative Care referral now and family can cancel at a later time if desired.  Allowed time for family to discuss information. After discussion, family reiterate they are still not yet ready to make a decision for either service.  Reviewed with  family that discharge is when patient is medically stable, which per attending he is stable for discharge today, and would not be held pending their decision. Education provided on how family can connect with patient's PCP to initiate referrals for either service if/when they were ready after discharge. Family express appreciation for clarification and information today. They have caregivers in place and are ready for patient's discharge home with [facility] today.  All questions and concerns addressed. Encouraged to call with questions and/or concerns. PMT number previously provided.  Went to visit patient at bedside - son/Gary present. TOC was present discussing discharge plan with patient and family. Patient was lying in bed awake and alert.  No signs or non-verbal gestures of pain or discomfort noted. No respiratory distress, increased work of breathing, or secretions noted. No further PMT needs at this time.  Length of Stay: 3  Current Medications: Scheduled Meds:   atorvastatin  80 mg Oral Daily   benazepril  20 mg Oral Daily   insulin aspart  0-6 Units Subcutaneous TID WC   metoprolol tartrate  50 mg Oral BID   mometasone-formoterol  2 puff Inhalation BID   pantoprazole  40 mg Oral Daily   rivaroxaban  15 mg Oral Q supper    Continuous Infusions:   PRN Meds: acetaminophen **OR** acetaminophen, albuterol, haloperidol **OR** haloperidol lactate, melatonin, ondansetron (ZOFRAN) IV, mouth rinse  Physical Exam Vitals and nursing note reviewed.  Constitutional:      General: He is not in acute distress.    Appearance: He is ill-appearing.  Pulmonary:     Effort: No respiratory distress.  Skin:    General: Skin is warm and dry.  Neurological:     Mental Status: He is alert.  Psychiatric:        Attention and Perception: Attention normal.        Behavior: Behavior is cooperative.             Vital Signs: BP 121/70 (BP Location: Right Arm)   Pulse 67   Temp 98.2 F (36.8 C) (Oral)    Resp 13   Ht 5\' 11"  (1.803 m)   Wt 81.1 kg   SpO2 94%   BMI 24.94 kg/m  SpO2: SpO2: 94 % O2 Device: O2 Device: Room Air O2 Flow Rate:    Intake/output summary:  Intake/Output Summary (Last 24 hours) at 05/28/2023 1248 Last data filed at 05/28/2023 6440 Gross per 24 hour  Intake 228.5 ml  Output 550 ml  Net -321.5 ml   LBM: Last BM Date : 05/24/23 Baseline Weight: Weight: 90.7 kg Most recent weight: Weight: 81.1 kg       Palliative Assessment/Data: PPS 60%      Patient Active Problem List   Diagnosis Date Noted   NSTEMI (non-ST elevated myocardial infarction) (HCC) 05/25/2023   Hypomagnesemia 05/25/2023   Chronic diastolic CHF (congestive heart failure) (HCC) 05/25/2023   Moderate persistent asthma 05/25/2023   Acute on chronic diastolic heart failure (HCC) 05/25/2023   Aortic valve stenosis, nonrheumatic 02/09/2022   SOB (shortness of breath) 02/09/2022   Paroxysmal atrial fibrillation (HCC) 01/19/2018   Hearing loss, sensorineural, asymmetrical 09/07/2017   History of CVA (cerebrovascular accident) 04/21/2017   PFO (patent foramen ovale) 12/30/2016   Chronic kidney disease, stage 3a (HCC) 12/13/2016   GERD (gastroesophageal reflux disease) 02/25/2016   Malnutrition of moderate degree 12/11/2015   Type 2 diabetes mellitus with hyperlipidemia (HCC) 12/06/2015   Dysarthria 12/05/2015   TIA (transient ischemic attack) 09/12/2013   Chronic sinusitis 09/12/2013   Disease of nasal cavity and sinuses 09/11/2013   Pulmonary nodules 09/11/2013   Metabolic syndrome 08/01/2013   Vitamin D deficiency 08/01/2013   PVD (peripheral vascular disease) (HCC) 11/25/2011   ILIAC ARTERY ANEURYSM 07/17/2010   Hyperlipidemia 08/28/2009   Coronary artery disease involving native coronary artery of native heart with unstable angina pectoris (HCC) 08/28/2009   Essential hypertension 06/05/2009   Cerebral atherosclerosis 06/04/2009    Palliative Care Assessment & Plan   Patient  Profile: 89 y.o. male  with past medical history of essential hypertension, hyperlipidemia, coronary disease status post MI in 1996 at which time he underwent angioplasty, type 2 diabetes  mellitus, paroxysmal atrial fibrillation chronically anticoagulated on Xarelto, chronic diastolic heart failure, severe aortic stenosis, chronic hyponatremia with baseline serum sodium 131-135 admitted on 05/24/2023 with aphasia, substernal chest burning.    In the ED, EKG findings revealed NSTEMI and cardiology was consulted.  Patient declined heart catheterization, preferred medical management.  Neurology was also consulted for concern of TIA.  Assessment: Principal Problem:   TIA (transient ischemic attack) Active Problems:   Essential hypertension   Type 2 diabetes mellitus with hyperlipidemia (HCC)   Chronic kidney disease, stage 3a (HCC)   Paroxysmal atrial fibrillation (HCC)   NSTEMI (non-ST elevated myocardial infarction) (HCC)   Chronic diastolic CHF (congestive heart failure) (HCC)   Recommendations/Plan: Family are ready for patient's discharge today - they have caregivers in place and are ready for his return home with [facility] Family are not ready to make decisions regarding outpatient Palliative Care vs hospice - education provided on how to pursue referral after discharge with PCP Continue full code as previously documented   Goals of Care and Additional Recommendations: Limitations on Scope of Treatment: Full Scope Treatment  Code Status:    Code Status Orders  (From admission, onward)           Start     Ordered   05/25/23 0214  Full code  Continuous       Question:  By:  Answer:  Consent: discussion documented in EHR   05/25/23 0214           Code Status History     Date Active Date Inactive Code Status Order ID Comments User Context   04/21/2017 1715 04/22/2017 2233 Full Code [id number]  Steward Ros Inpatient   12/13/2016 2258 12/15/2016 2148 Full Code [id number]   Briscoe Deutscher, MD ED   12/10/2015 1846 12/11/2015 2000 Full Code [id number]  Russella Dar, NP Inpatient   12/06/2015 0200 12/06/2015 1921 Full Code [id number]  Bobette Mo, MD Inpatient   07/15/2015 1530 07/17/2015 1953 Full Code [id number]  Lunette Stands, MD ED      Advance Directive Documentation    Flowsheet Row Most Recent Value  Type of Advance Directive Healthcare Power of Attorney  Pre-existing out of facility DNR order (yellow form or pink MOST form) --  "MOST" Form in Place? --       Prognosis:  Unable to determine  Discharge Planning: Home with Home Health  Care plan was discussed with primary RN, patient's family, Dr. Sharl Ma, [facility]  Thank you for allowing the Palliative Medicine Team to assist in the care of this patient.   Total Time 40 minutes Prolonged Time Billed  no       Haskel Khan, NP  Please contact Palliative Medicine Team phone at [phone] for questions and concerns.   *Portions of this note are a verbal dictation therefore any spelling and/or grammatical errors are due to the "[employer]" system interpretation.

## 2023-05-28 NOTE — Care Management Important Message (Signed)
Important Message  Patient Details  Name: [name] MRN: [id number] Date of Birth: 1929/11/27   Important Message Given:  Yes - Medicare IM     Dorena Bodo 05/28/2023, 2:22 PM

## 2023-05-28 NOTE — Progress Notes (Incomplete)
[facility]  PROGRESS NOTE  [name] ZOX:[id number] DOB: [birth year] DOA: 05/24/2023 PCP: Dettinger, Elige Radon, MD   Brief HPI:   89 yo male with the past medical history of hypertension, hyperlipidemia, coronary artery disease, T2DM and atrial fibrillation who presented with aphasia.  Patient was found having difficulty finding words lasting for about 20 minutes.  Also had precordial chest pain.  Blood pressure was 138/73.  Troponin was elevated at 1131 and 1083.  EKG showed left axis deviation, showed atrial fibrillation with no significant ST changes.  CTA of the head and neck was positive for abnormal right vertebral artery, possible acute occlusion of slow retrograde flow.  Very severe stenosis of the right subclavian artery.  Brain MRI showed no acute intracranial abnormality.    Assessment/Plan:   TIA -Resolved; MRI negative for stroke -Neurology was consulted; recommended EEG to rule out seizure -EEG did not show epileptiform discharges -Continue Xarelto  NSTEMI -Presented with significantly elevated troponin, cardiology was consulted -No aggressive intervention planned as patient and family refused aggressive intervention -Will consult palliative care for further clarification of goals of care -Echocardiogram showed no regional wall motion abnormalities, EF 55 to 60%; grade 1 diastolic dysfunction -Currently on IV heparin; IV heparin we will switch to Xarelto -Cardiology recommends to continue with aspirin 81 mg , if he does not have bleeding problems  Chronic diastolic CHF -Echocardiogram shows grade 1 diastolic dysfunction,EF 55 to 81% -Diuresis on hold -Cardiology following  Moderate aortic valve stenosis -Echocardiogram shows severe calcification of the aortic valve -Aortic valve gradient 32 mmHg, aortic valve area 1.38 centimeters square -Patient not interested in TAVR  Hypertension -Blood pressure well-controlled -Continue metoprolol,  benazepril  Paroxysmal atrial fibrillation -Heart rate controlled with metoprolol -Will be ordered on IV heparin, will switch to Xarelto  Hyponatremia -Sodium 129 -Serum osmolality 294 -Urine osmolality 335  Hypomagnesemia -Replete  Diabetes mellitus type 2 -Continue sliding scale insulin with NovoLog -CBG well-controlled   Goals of care -Will consult palliative care for goals of care  Medications     atorvastatin  80 mg Oral Daily   benazepril  20 mg Oral Daily   insulin aspart  0-6 Units Subcutaneous TID WC   metoprolol tartrate  50 mg Oral BID   mometasone-formoterol  2 puff Inhalation BID   pantoprazole  40 mg Oral Daily   rivaroxaban  15 mg Oral Q supper     Data Reviewed:   CBG:  Recent Labs  Lab 05/27/23 0600 05/27/23 1114 05/27/23 1637 05/27/23 2111 05/28/23 0626  GLUCAP 166* 169* 133* 179* 163*    SpO2: 94 %    Vitals:   05/27/23 2309 05/28/23 0233 05/28/23 0500 05/28/23 0731  BP: 112/68 135/82  125/73  Pulse: 69 74    Resp: 15 13    Temp: 98.3 F (36.8 C) 98.1 F (36.7 C)  98 F (36.7 C)  TempSrc: Oral Oral  Oral  SpO2: 94% 94%    Weight:   81.1 kg   Height:          Data Reviewed:  Basic Metabolic Panel: Recent Labs  Lab 05/24/23 2126 05/25/23 0316 05/26/23 0216  NA 130* 129* 129*  K 3.9 3.6 3.7  CL 93* 93* 94*  CO2 23 23 25   GLUCOSE 194* 158* 147*  BUN 19 19 21   CREATININE 1.32* 1.42* 1.42*  CALCIUM 8.9 8.7* 8.7*  MG 1.4* 1.5* 2.1    CBC: Recent Labs  Lab 05/24/23 2126 05/25/23 0316 05/26/23  0216 05/26/23 1547 05/27/23 0945  WBC 9.2 8.0 7.2 6.9 10.7*  NEUTROABS 6.8 5.2  --   --   --   HGB 13.0 11.9* 12.5* 12.9* 14.2  HCT 36.8* 34.0* 35.2* 37.1* 40.9  MCV 96.3 97.1 96.4 95.4 97.1  PLT 172 152 167 160 213    LFT Recent Labs  Lab 05/24/23 2126 05/25/23 0316  AST 34 28  ALT 21 18  ALKPHOS 73 64  BILITOT 1.3* 1.4*  PROT 6.8 6.0*  ALBUMIN 3.7 3.4*     Antibiotics: Anti-infectives (From  admission, onward)    None        DVT prophylaxis: Heparin  Code Status: Full code  Family Communication: No family at bedside   CONSULTS cardiology, neurology   Subjective     Objective    Physical Examination:     Status is: Inpatient:             Meredeth Ide   [facility] If 7PM-7AM, please contact night-coverage at [email or web address], Office  [phone]   05/28/2023, 9:19 AM  LOS: 3 days

## 2023-05-28 NOTE — Discharge Instructions (Signed)
Out patient palliative care follow up.  ?

## 2023-05-28 NOTE — TOC Progression Note (Signed)
Transition of Care [facility]) - Progression Note    Patient Details  Name: [name] MRN: [id number] Date of Birth: 22-Dec-1929  Transition of Care [facility]) CM/SW Contact  Nadene Rubins Adria Devon, RN Phone Number: 05/28/2023, 1:44 PM  Clinical Narrative:      Sherron Monday to patient and son [name] at bedside.   Family has decided to take patient home with home health PT/OT. Explained home health only comes a couple times a week for about a hour at a time. Voiced understanding. No preference.  NCM will start working on finding an agency in network with staffing.   PT also recommending 3 in 1. NCM explained what a 3 in 1 is and showed [name] the 3 in1 in patient's hospital bathroom.   [name] called family and discussed. They already have one.   Kandee Keen with [facility] checking to see if he can accept referral. Kandee Keen with [facility] accepted referral.   Also discussed outpatient palliative care. [name] voiced understanding , however states family is not ready to start outpatient palliative care services. NCM explained if they change their mind after discharge PCP can arrange outpatient palliative care services. [name] voiced understanding  Expected Discharge Plan: Home w Home Health Services Barriers to Discharge: Continued Medical Work up  Expected Discharge Plan and Services   Discharge Planning Services: CM Consult Post Acute Care Choice: Home Health, Durable Medical Equipment Living arrangements for the past 2 months: Single Family Home Expected Discharge Date: 05/28/23               DME Arranged: 3-N-1                     Social Determinants of Health (SDOH) Interventions SDOH Screenings   Food Insecurity: No Food Insecurity (05/25/2023)  Housing: Low Risk  (05/25/2023)  Transportation Needs: No Transportation Needs (05/25/2023)  Utilities: Not At Risk (05/25/2023)  Alcohol Screen: Low Risk  (01/07/2023)  Depression (PHQ2-9): Low Risk  (04/21/2023)  Financial Resource Strain: Low Risk  (01/07/2023)   Physical Activity: Insufficiently Active (01/07/2023)  Social Connections: Moderately Integrated (01/07/2023)  Stress: No Stress Concern Present (01/07/2023)  Tobacco Use: Medium Risk (05/25/2023)    Readmission Risk Interventions     No data to display

## 2023-05-28 NOTE — Discharge Summary (Signed)
1.44 m/s MV Vmean:      84.9 cm/s MV Decel Time: 201 msec MV E velocity: 82.30 cm/s MV A velocity: 129.00 cm/s MV E/A ratio:  0.64 Dalton McleanMD Electronically signed by Wilfred Lacy Signature Date/Time: 05/26/2023/10:25:41 AM    Final    EEG adult  Result Date: 05/25/2023 Charlsie Quest, MD     05/25/2023  6:01 PM Patient Name: [name] MRN: [id number] Epilepsy Attending: Charlsie Quest Referring Physician/Provider: Margaretmary Dys, MD Date: 05/24/2023 Duration: 23.24 mins Patient history:  89 y.o. male with history of HTN, HLD, CAD, DM2, 3x previous strokes or TIAs (2x in 2015, 1x in 2021) afib on xarelto  presenting with a 20 minute episode of word-finding difficulty with complete resolution. Level of alertness: Awake AEDs during EEG study: None Technical aspects: This EEG study was done with scalp electrodes positioned according to the 10-20 International system of electrode placement. Electrical activity was reviewed with band pass filter of 1-70Hz , sensitivity of 7 uV/mm, display speed of 64mm/sec with a 60Hz  notched filter applied as appropriate. EEG data were recorded continuously and digitally stored.  Video monitoring was available and reviewed as appropriate. Description: The posterior dominant rhythm consists of 7 Hz activity of moderate voltage (25-35 uV) seen predominantly in posterior head regions, symmetric and reactive to eye  opening and eye closing. EEG showed continuous generalized predominantly 5 to 6 Hz theta slowing admixed with intermittent 2-3Hz  delta slowing. Hyperventilation and photic stimulation were not performed.   ABNORMALITY - Continuous slow, generalized - Background slow IMPRESSION: This study is suggestive of moderate diffuse encephalopathy. No seizures or epileptiform discharges were seen throughout the recording. Priyanka Annabelle Harman   VAS US CAROTID  Result Date: 05/25/2023 Carotid Arterial Duplex Study Patient Name:  [name]  Date of Exam:   05/25/2023 Medical Rec #: [id number]       Accession #:    [id number] Date of Birth: May 05, 1930       Patient Gender: M Patient Age:   45 years Exam Location:  [facility] Procedure:      VAS US CAROTID Referring Phys: Terrilee Files [facility] --------------------------------------------------------------------------------  Indications:       CVA. Risk Factors:      Hypertension, hyperlipidemia, Diabetes. Comparison Study:  05/25/2023 - CT ANGIO HEAD NECK W WO CM                     1. Positive for abnormal Right Vertebral Artery, new since a                    2021                    CTA:                    Differential considerations include atherosclerotic Occlusion                    of the                    Right V1 segment (severe calcified atherosclerotic stenosis                    at the                    right vertebral origin) versus slow retrograde flow within  1.44 m/s MV Vmean:      84.9 cm/s MV Decel Time: 201 msec MV E velocity: 82.30 cm/s MV A velocity: 129.00 cm/s MV E/A ratio:  0.64 Dalton McleanMD Electronically signed by Wilfred Lacy Signature Date/Time: 05/26/2023/10:25:41 AM    Final    EEG adult  Result Date: 05/25/2023 Charlsie Quest, MD     05/25/2023  6:01 PM Patient Name: [name] MRN: [id number] Epilepsy Attending: Charlsie Quest Referring Physician/Provider: Margaretmary Dys, MD Date: 05/24/2023 Duration: 23.24 mins Patient history:  89 y.o. male with history of HTN, HLD, CAD, DM2, 3x previous strokes or TIAs (2x in 2015, 1x in 2021) afib on xarelto  presenting with a 20 minute episode of word-finding difficulty with complete resolution. Level of alertness: Awake AEDs during EEG study: None Technical aspects: This EEG study was done with scalp electrodes positioned according to the 10-20 International system of electrode placement. Electrical activity was reviewed with band pass filter of 1-70Hz , sensitivity of 7 uV/mm, display speed of 64mm/sec with a 60Hz  notched filter applied as appropriate. EEG data were recorded continuously and digitally stored.  Video monitoring was available and reviewed as appropriate. Description: The posterior dominant rhythm consists of 7 Hz activity of moderate voltage (25-35 uV) seen predominantly in posterior head regions, symmetric and reactive to eye  opening and eye closing. EEG showed continuous generalized predominantly 5 to 6 Hz theta slowing admixed with intermittent 2-3Hz  delta slowing. Hyperventilation and photic stimulation were not performed.   ABNORMALITY - Continuous slow, generalized - Background slow IMPRESSION: This study is suggestive of moderate diffuse encephalopathy. No seizures or epileptiform discharges were seen throughout the recording. Priyanka Annabelle Harman   VAS US CAROTID  Result Date: 05/25/2023 Carotid Arterial Duplex Study Patient Name:  [name]  Date of Exam:   05/25/2023 Medical Rec #: [id number]       Accession #:    [id number] Date of Birth: May 05, 1930       Patient Gender: M Patient Age:   45 years Exam Location:  [facility] Procedure:      VAS US CAROTID Referring Phys: Terrilee Files [facility] --------------------------------------------------------------------------------  Indications:       CVA. Risk Factors:      Hypertension, hyperlipidemia, Diabetes. Comparison Study:  05/25/2023 - CT ANGIO HEAD NECK W WO CM                     1. Positive for abnormal Right Vertebral Artery, new since a                    2021                    CTA:                    Differential considerations include atherosclerotic Occlusion                    of the                    Right V1 segment (severe calcified atherosclerotic stenosis                    at the                    right vertebral origin) versus slow retrograde flow within  1.44 m/s MV Vmean:      84.9 cm/s MV Decel Time: 201 msec MV E velocity: 82.30 cm/s MV A velocity: 129.00 cm/s MV E/A ratio:  0.64 Dalton McleanMD Electronically signed by Wilfred Lacy Signature Date/Time: 05/26/2023/10:25:41 AM    Final    EEG adult  Result Date: 05/25/2023 Charlsie Quest, MD     05/25/2023  6:01 PM Patient Name: [name] MRN: [id number] Epilepsy Attending: Charlsie Quest Referring Physician/Provider: Margaretmary Dys, MD Date: 05/24/2023 Duration: 23.24 mins Patient history:  89 y.o. male with history of HTN, HLD, CAD, DM2, 3x previous strokes or TIAs (2x in 2015, 1x in 2021) afib on xarelto  presenting with a 20 minute episode of word-finding difficulty with complete resolution. Level of alertness: Awake AEDs during EEG study: None Technical aspects: This EEG study was done with scalp electrodes positioned according to the 10-20 International system of electrode placement. Electrical activity was reviewed with band pass filter of 1-70Hz , sensitivity of 7 uV/mm, display speed of 64mm/sec with a 60Hz  notched filter applied as appropriate. EEG data were recorded continuously and digitally stored.  Video monitoring was available and reviewed as appropriate. Description: The posterior dominant rhythm consists of 7 Hz activity of moderate voltage (25-35 uV) seen predominantly in posterior head regions, symmetric and reactive to eye  opening and eye closing. EEG showed continuous generalized predominantly 5 to 6 Hz theta slowing admixed with intermittent 2-3Hz  delta slowing. Hyperventilation and photic stimulation were not performed.   ABNORMALITY - Continuous slow, generalized - Background slow IMPRESSION: This study is suggestive of moderate diffuse encephalopathy. No seizures or epileptiform discharges were seen throughout the recording. Priyanka Annabelle Harman   VAS US CAROTID  Result Date: 05/25/2023 Carotid Arterial Duplex Study Patient Name:  [name]  Date of Exam:   05/25/2023 Medical Rec #: [id number]       Accession #:    [id number] Date of Birth: May 05, 1930       Patient Gender: M Patient Age:   45 years Exam Location:  [facility] Procedure:      VAS US CAROTID Referring Phys: Terrilee Files [facility] --------------------------------------------------------------------------------  Indications:       CVA. Risk Factors:      Hypertension, hyperlipidemia, Diabetes. Comparison Study:  05/25/2023 - CT ANGIO HEAD NECK W WO CM                     1. Positive for abnormal Right Vertebral Artery, new since a                    2021                    CTA:                    Differential considerations include atherosclerotic Occlusion                    of the                    Right V1 segment (severe calcified atherosclerotic stenosis                    at the                    right vertebral origin) versus slow retrograde flow within  1.44 m/s MV Vmean:      84.9 cm/s MV Decel Time: 201 msec MV E velocity: 82.30 cm/s MV A velocity: 129.00 cm/s MV E/A ratio:  0.64 Dalton McleanMD Electronically signed by Wilfred Lacy Signature Date/Time: 05/26/2023/10:25:41 AM    Final    EEG adult  Result Date: 05/25/2023 Charlsie Quest, MD     05/25/2023  6:01 PM Patient Name: [name] MRN: [id number] Epilepsy Attending: Charlsie Quest Referring Physician/Provider: Margaretmary Dys, MD Date: 05/24/2023 Duration: 23.24 mins Patient history:  89 y.o. male with history of HTN, HLD, CAD, DM2, 3x previous strokes or TIAs (2x in 2015, 1x in 2021) afib on xarelto  presenting with a 20 minute episode of word-finding difficulty with complete resolution. Level of alertness: Awake AEDs during EEG study: None Technical aspects: This EEG study was done with scalp electrodes positioned according to the 10-20 International system of electrode placement. Electrical activity was reviewed with band pass filter of 1-70Hz , sensitivity of 7 uV/mm, display speed of 64mm/sec with a 60Hz  notched filter applied as appropriate. EEG data were recorded continuously and digitally stored.  Video monitoring was available and reviewed as appropriate. Description: The posterior dominant rhythm consists of 7 Hz activity of moderate voltage (25-35 uV) seen predominantly in posterior head regions, symmetric and reactive to eye  opening and eye closing. EEG showed continuous generalized predominantly 5 to 6 Hz theta slowing admixed with intermittent 2-3Hz  delta slowing. Hyperventilation and photic stimulation were not performed.   ABNORMALITY - Continuous slow, generalized - Background slow IMPRESSION: This study is suggestive of moderate diffuse encephalopathy. No seizures or epileptiform discharges were seen throughout the recording. Priyanka Annabelle Harman   VAS US CAROTID  Result Date: 05/25/2023 Carotid Arterial Duplex Study Patient Name:  [name]  Date of Exam:   05/25/2023 Medical Rec #: [id number]       Accession #:    [id number] Date of Birth: May 05, 1930       Patient Gender: M Patient Age:   45 years Exam Location:  [facility] Procedure:      VAS US CAROTID Referring Phys: Terrilee Files [facility] --------------------------------------------------------------------------------  Indications:       CVA. Risk Factors:      Hypertension, hyperlipidemia, Diabetes. Comparison Study:  05/25/2023 - CT ANGIO HEAD NECK W WO CM                     1. Positive for abnormal Right Vertebral Artery, new since a                    2021                    CTA:                    Differential considerations include atherosclerotic Occlusion                    of the                    Right V1 segment (severe calcified atherosclerotic stenosis                    at the                    right vertebral origin) versus slow retrograde flow within  1.44 m/s MV Vmean:      84.9 cm/s MV Decel Time: 201 msec MV E velocity: 82.30 cm/s MV A velocity: 129.00 cm/s MV E/A ratio:  0.64 Dalton McleanMD Electronically signed by Wilfred Lacy Signature Date/Time: 05/26/2023/10:25:41 AM    Final    EEG adult  Result Date: 05/25/2023 Charlsie Quest, MD     05/25/2023  6:01 PM Patient Name: [name] MRN: [id number] Epilepsy Attending: Charlsie Quest Referring Physician/Provider: Margaretmary Dys, MD Date: 05/24/2023 Duration: 23.24 mins Patient history:  89 y.o. male with history of HTN, HLD, CAD, DM2, 3x previous strokes or TIAs (2x in 2015, 1x in 2021) afib on xarelto  presenting with a 20 minute episode of word-finding difficulty with complete resolution. Level of alertness: Awake AEDs during EEG study: None Technical aspects: This EEG study was done with scalp electrodes positioned according to the 10-20 International system of electrode placement. Electrical activity was reviewed with band pass filter of 1-70Hz , sensitivity of 7 uV/mm, display speed of 64mm/sec with a 60Hz  notched filter applied as appropriate. EEG data were recorded continuously and digitally stored.  Video monitoring was available and reviewed as appropriate. Description: The posterior dominant rhythm consists of 7 Hz activity of moderate voltage (25-35 uV) seen predominantly in posterior head regions, symmetric and reactive to eye  opening and eye closing. EEG showed continuous generalized predominantly 5 to 6 Hz theta slowing admixed with intermittent 2-3Hz  delta slowing. Hyperventilation and photic stimulation were not performed.   ABNORMALITY - Continuous slow, generalized - Background slow IMPRESSION: This study is suggestive of moderate diffuse encephalopathy. No seizures or epileptiform discharges were seen throughout the recording. Priyanka Annabelle Harman   VAS US CAROTID  Result Date: 05/25/2023 Carotid Arterial Duplex Study Patient Name:  [name]  Date of Exam:   05/25/2023 Medical Rec #: [id number]       Accession #:    [id number] Date of Birth: May 05, 1930       Patient Gender: M Patient Age:   45 years Exam Location:  [facility] Procedure:      VAS US CAROTID Referring Phys: Terrilee Files [facility] --------------------------------------------------------------------------------  Indications:       CVA. Risk Factors:      Hypertension, hyperlipidemia, Diabetes. Comparison Study:  05/25/2023 - CT ANGIO HEAD NECK W WO CM                     1. Positive for abnormal Right Vertebral Artery, new since a                    2021                    CTA:                    Differential considerations include atherosclerotic Occlusion                    of the                    Right V1 segment (severe calcified atherosclerotic stenosis                    at the                    right vertebral origin) versus slow retrograde flow within  1.44 m/s MV Vmean:      84.9 cm/s MV Decel Time: 201 msec MV E velocity: 82.30 cm/s MV A velocity: 129.00 cm/s MV E/A ratio:  0.64 Dalton McleanMD Electronically signed by Wilfred Lacy Signature Date/Time: 05/26/2023/10:25:41 AM    Final    EEG adult  Result Date: 05/25/2023 Charlsie Quest, MD     05/25/2023  6:01 PM Patient Name: [name] MRN: [id number] Epilepsy Attending: Charlsie Quest Referring Physician/Provider: Margaretmary Dys, MD Date: 05/24/2023 Duration: 23.24 mins Patient history:  89 y.o. male with history of HTN, HLD, CAD, DM2, 3x previous strokes or TIAs (2x in 2015, 1x in 2021) afib on xarelto  presenting with a 20 minute episode of word-finding difficulty with complete resolution. Level of alertness: Awake AEDs during EEG study: None Technical aspects: This EEG study was done with scalp electrodes positioned according to the 10-20 International system of electrode placement. Electrical activity was reviewed with band pass filter of 1-70Hz , sensitivity of 7 uV/mm, display speed of 64mm/sec with a 60Hz  notched filter applied as appropriate. EEG data were recorded continuously and digitally stored.  Video monitoring was available and reviewed as appropriate. Description: The posterior dominant rhythm consists of 7 Hz activity of moderate voltage (25-35 uV) seen predominantly in posterior head regions, symmetric and reactive to eye  opening and eye closing. EEG showed continuous generalized predominantly 5 to 6 Hz theta slowing admixed with intermittent 2-3Hz  delta slowing. Hyperventilation and photic stimulation were not performed.   ABNORMALITY - Continuous slow, generalized - Background slow IMPRESSION: This study is suggestive of moderate diffuse encephalopathy. No seizures or epileptiform discharges were seen throughout the recording. Priyanka Annabelle Harman   VAS US CAROTID  Result Date: 05/25/2023 Carotid Arterial Duplex Study Patient Name:  [name]  Date of Exam:   05/25/2023 Medical Rec #: [id number]       Accession #:    [id number] Date of Birth: May 05, 1930       Patient Gender: M Patient Age:   45 years Exam Location:  [facility] Procedure:      VAS US CAROTID Referring Phys: Terrilee Files [facility] --------------------------------------------------------------------------------  Indications:       CVA. Risk Factors:      Hypertension, hyperlipidemia, Diabetes. Comparison Study:  05/25/2023 - CT ANGIO HEAD NECK W WO CM                     1. Positive for abnormal Right Vertebral Artery, new since a                    2021                    CTA:                    Differential considerations include atherosclerotic Occlusion                    of the                    Right V1 segment (severe calcified atherosclerotic stenosis                    at the                    right vertebral origin) versus slow retrograde flow within  Physician Discharge Summary   Patient: [name] MRN: [id number] DOB: 02/29/1930  Admit date:     05/24/2023  Discharge date: 05/28/23  Discharge Physician: Meredeth Ide   PCP: Dettinger, Elige Radon, MD   Recommendations at discharge:   Outpatient palliative care follow-up Will discharge on home health PT/OT  Discharge Diagnoses: Principal Problem:   TIA (transient ischemic attack) Active Problems:   Chronic diastolic CHF (congestive heart failure) (HCC)   NSTEMI (non-ST elevated myocardial infarction) (HCC)   Essential hypertension   Paroxysmal atrial fibrillation (HCC)   Chronic kidney disease, stage 3a (HCC)   Type 2 diabetes mellitus with hyperlipidemia (HCC)  Resolved Problems:   * No resolved hospital problems. *  Hospital Course:  89 yo male with the past medical history of hypertension, hyperlipidemia, coronary artery disease, T2DM and atrial fibrillation who presented with aphasia. Patient was found having difficulty finding words lasting for about 20 minutes. Also had precordial chest pain. Blood pressure was 138/73. Troponin was elevated at 1131 and 1083. EKG showed left axis deviation, showed atrial fibrillation with no significant ST changes. CTA of the head and neck was positive for abnormal right vertebral artery, possible acute occlusion of slow retrograde flow. Very severe stenosis of the right subclavian artery. Brain MRI showed no acute intracranial abnormality.   Assessment and Plan:   TIA -Resolved; MRI negative for stroke -Neurology was consulted; recommended EEG to rule out seizure -EEG did not show epileptiform discharges -Continue Xarelto   NSTEMI -Presented with significantly elevated troponin, cardiology was consulted -No aggressive intervention planned as patient and family refused aggressive intervention -Echocardiogram showed no regional wall motion abnormalities, EF 55 to 60%; grade 1 diastolic dysfunction -Currently on IV heparin; IV  heparin we will switch to Xarelto -Cardiology recommends to continue with aspirin 81 mg , if he does not have bleeding problems -Will discharge on aspirin 81 mg p.o. daily   Chronic diastolic CHF -Echocardiogram shows grade 1 diastolic dysfunction,EF 55 to 56%    Moderate aortic valve stenosis -Echocardiogram shows severe calcification of the aortic valve -Aortic valve gradient 32 mmHg, aortic valve area 1.38 centimeters square -Patient not interested in TAVR   Hypertension -Blood pressure well-controlled -Continue metoprolol   Paroxysmal atrial fibrillation -Heart rate controlled with metoprolol -Continue Xarelto   Hyponatremia -Sodium 129 -Serum osmolality 294 -Urine osmolality 335   Hypomagnesemia -Replete   Diabetes mellitus type 2 -Continue home regimen   Goals of care -Palliative care consulted -Family not interested in hospice at this time -Will discharge home with home health PT/OT         Consultants: Cardiology Procedures performed:  Disposition: Home Diet recommendation:  Discharge Diet Orders (From admission, onward)     Start     Ordered   05/28/23 0000  Diet - low sodium heart healthy        05/28/23 1333           Carb modified diet DISCHARGE MEDICATION: Allergies as of 05/28/2023       Reactions   Alteplase Anaphylaxis   Codeine Other (See Comments)   Bad dreams   Diclofenac Sodium Diarrhea   Gemfibrozil Nausea Only   Other Reaction(s): GI Intolerance   Voltaren [diclofenac Sodium] Diarrhea        Medication List     STOP taking these medications    hydrochlorothiazide 25 MG tablet Commonly known as: HYDRODIURIL       TAKE these medications    Accu-Chek FastClix Lancets Misc  1.44 m/s MV Vmean:      84.9 cm/s MV Decel Time: 201 msec MV E velocity: 82.30 cm/s MV A velocity: 129.00 cm/s MV E/A ratio:  0.64 Dalton McleanMD Electronically signed by Wilfred Lacy Signature Date/Time: 05/26/2023/10:25:41 AM    Final    EEG adult  Result Date: 05/25/2023 Charlsie Quest, MD     05/25/2023  6:01 PM Patient Name: [name] MRN: [id number] Epilepsy Attending: Charlsie Quest Referring Physician/Provider: Margaretmary Dys, MD Date: 05/24/2023 Duration: 23.24 mins Patient history:  89 y.o. male with history of HTN, HLD, CAD, DM2, 3x previous strokes or TIAs (2x in 2015, 1x in 2021) afib on xarelto  presenting with a 20 minute episode of word-finding difficulty with complete resolution. Level of alertness: Awake AEDs during EEG study: None Technical aspects: This EEG study was done with scalp electrodes positioned according to the 10-20 International system of electrode placement. Electrical activity was reviewed with band pass filter of 1-70Hz , sensitivity of 7 uV/mm, display speed of 64mm/sec with a 60Hz  notched filter applied as appropriate. EEG data were recorded continuously and digitally stored.  Video monitoring was available and reviewed as appropriate. Description: The posterior dominant rhythm consists of 7 Hz activity of moderate voltage (25-35 uV) seen predominantly in posterior head regions, symmetric and reactive to eye  opening and eye closing. EEG showed continuous generalized predominantly 5 to 6 Hz theta slowing admixed with intermittent 2-3Hz  delta slowing. Hyperventilation and photic stimulation were not performed.   ABNORMALITY - Continuous slow, generalized - Background slow IMPRESSION: This study is suggestive of moderate diffuse encephalopathy. No seizures or epileptiform discharges were seen throughout the recording. Priyanka Annabelle Harman   VAS US CAROTID  Result Date: 05/25/2023 Carotid Arterial Duplex Study Patient Name:  [name]  Date of Exam:   05/25/2023 Medical Rec #: [id number]       Accession #:    [id number] Date of Birth: May 05, 1930       Patient Gender: M Patient Age:   45 years Exam Location:  [facility] Procedure:      VAS US CAROTID Referring Phys: Terrilee Files [facility] --------------------------------------------------------------------------------  Indications:       CVA. Risk Factors:      Hypertension, hyperlipidemia, Diabetes. Comparison Study:  05/25/2023 - CT ANGIO HEAD NECK W WO CM                     1. Positive for abnormal Right Vertebral Artery, new since a                    2021                    CTA:                    Differential considerations include atherosclerotic Occlusion                    of the                    Right V1 segment (severe calcified atherosclerotic stenosis                    at the                    right vertebral origin) versus slow retrograde flow within  Physician Discharge Summary   Patient: [name] MRN: [id number] DOB: 02/29/1930  Admit date:     05/24/2023  Discharge date: 05/28/23  Discharge Physician: Meredeth Ide   PCP: Dettinger, Elige Radon, MD   Recommendations at discharge:   Outpatient palliative care follow-up Will discharge on home health PT/OT  Discharge Diagnoses: Principal Problem:   TIA (transient ischemic attack) Active Problems:   Chronic diastolic CHF (congestive heart failure) (HCC)   NSTEMI (non-ST elevated myocardial infarction) (HCC)   Essential hypertension   Paroxysmal atrial fibrillation (HCC)   Chronic kidney disease, stage 3a (HCC)   Type 2 diabetes mellitus with hyperlipidemia (HCC)  Resolved Problems:   * No resolved hospital problems. *  Hospital Course:  89 yo male with the past medical history of hypertension, hyperlipidemia, coronary artery disease, T2DM and atrial fibrillation who presented with aphasia. Patient was found having difficulty finding words lasting for about 20 minutes. Also had precordial chest pain. Blood pressure was 138/73. Troponin was elevated at 1131 and 1083. EKG showed left axis deviation, showed atrial fibrillation with no significant ST changes. CTA of the head and neck was positive for abnormal right vertebral artery, possible acute occlusion of slow retrograde flow. Very severe stenosis of the right subclavian artery. Brain MRI showed no acute intracranial abnormality.   Assessment and Plan:   TIA -Resolved; MRI negative for stroke -Neurology was consulted; recommended EEG to rule out seizure -EEG did not show epileptiform discharges -Continue Xarelto   NSTEMI -Presented with significantly elevated troponin, cardiology was consulted -No aggressive intervention planned as patient and family refused aggressive intervention -Echocardiogram showed no regional wall motion abnormalities, EF 55 to 60%; grade 1 diastolic dysfunction -Currently on IV heparin; IV  heparin we will switch to Xarelto -Cardiology recommends to continue with aspirin 81 mg , if he does not have bleeding problems -Will discharge on aspirin 81 mg p.o. daily   Chronic diastolic CHF -Echocardiogram shows grade 1 diastolic dysfunction,EF 55 to 56%    Moderate aortic valve stenosis -Echocardiogram shows severe calcification of the aortic valve -Aortic valve gradient 32 mmHg, aortic valve area 1.38 centimeters square -Patient not interested in TAVR   Hypertension -Blood pressure well-controlled -Continue metoprolol   Paroxysmal atrial fibrillation -Heart rate controlled with metoprolol -Continue Xarelto   Hyponatremia -Sodium 129 -Serum osmolality 294 -Urine osmolality 335   Hypomagnesemia -Replete   Diabetes mellitus type 2 -Continue home regimen   Goals of care -Palliative care consulted -Family not interested in hospice at this time -Will discharge home with home health PT/OT         Consultants: Cardiology Procedures performed:  Disposition: Home Diet recommendation:  Discharge Diet Orders (From admission, onward)     Start     Ordered   05/28/23 0000  Diet - low sodium heart healthy        05/28/23 1333           Carb modified diet DISCHARGE MEDICATION: Allergies as of 05/28/2023       Reactions   Alteplase Anaphylaxis   Codeine Other (See Comments)   Bad dreams   Diclofenac Sodium Diarrhea   Gemfibrozil Nausea Only   Other Reaction(s): GI Intolerance   Voltaren [diclofenac Sodium] Diarrhea        Medication List     STOP taking these medications    hydrochlorothiazide 25 MG tablet Commonly known as: HYDRODIURIL       TAKE these medications    Accu-Chek FastClix Lancets Misc  1.44 m/s MV Vmean:      84.9 cm/s MV Decel Time: 201 msec MV E velocity: 82.30 cm/s MV A velocity: 129.00 cm/s MV E/A ratio:  0.64 Dalton McleanMD Electronically signed by Wilfred Lacy Signature Date/Time: 05/26/2023/10:25:41 AM    Final    EEG adult  Result Date: 05/25/2023 Charlsie Quest, MD     05/25/2023  6:01 PM Patient Name: [name] MRN: [id number] Epilepsy Attending: Charlsie Quest Referring Physician/Provider: Margaretmary Dys, MD Date: 05/24/2023 Duration: 23.24 mins Patient history:  89 y.o. male with history of HTN, HLD, CAD, DM2, 3x previous strokes or TIAs (2x in 2015, 1x in 2021) afib on xarelto  presenting with a 20 minute episode of word-finding difficulty with complete resolution. Level of alertness: Awake AEDs during EEG study: None Technical aspects: This EEG study was done with scalp electrodes positioned according to the 10-20 International system of electrode placement. Electrical activity was reviewed with band pass filter of 1-70Hz , sensitivity of 7 uV/mm, display speed of 64mm/sec with a 60Hz  notched filter applied as appropriate. EEG data were recorded continuously and digitally stored.  Video monitoring was available and reviewed as appropriate. Description: The posterior dominant rhythm consists of 7 Hz activity of moderate voltage (25-35 uV) seen predominantly in posterior head regions, symmetric and reactive to eye  opening and eye closing. EEG showed continuous generalized predominantly 5 to 6 Hz theta slowing admixed with intermittent 2-3Hz  delta slowing. Hyperventilation and photic stimulation were not performed.   ABNORMALITY - Continuous slow, generalized - Background slow IMPRESSION: This study is suggestive of moderate diffuse encephalopathy. No seizures or epileptiform discharges were seen throughout the recording. Priyanka Annabelle Harman   VAS US CAROTID  Result Date: 05/25/2023 Carotid Arterial Duplex Study Patient Name:  [name]  Date of Exam:   05/25/2023 Medical Rec #: [id number]       Accession #:    [id number] Date of Birth: May 05, 1930       Patient Gender: M Patient Age:   45 years Exam Location:  [facility] Procedure:      VAS US CAROTID Referring Phys: Terrilee Files [facility] --------------------------------------------------------------------------------  Indications:       CVA. Risk Factors:      Hypertension, hyperlipidemia, Diabetes. Comparison Study:  05/25/2023 - CT ANGIO HEAD NECK W WO CM                     1. Positive for abnormal Right Vertebral Artery, new since a                    2021                    CTA:                    Differential considerations include atherosclerotic Occlusion                    of the                    Right V1 segment (severe calcified atherosclerotic stenosis                    at the                    right vertebral origin) versus slow retrograde flow within  Physician Discharge Summary   Patient: [name] MRN: [id number] DOB: 02/29/1930  Admit date:     05/24/2023  Discharge date: 05/28/23  Discharge Physician: Meredeth Ide   PCP: Dettinger, Elige Radon, MD   Recommendations at discharge:   Outpatient palliative care follow-up Will discharge on home health PT/OT  Discharge Diagnoses: Principal Problem:   TIA (transient ischemic attack) Active Problems:   Chronic diastolic CHF (congestive heart failure) (HCC)   NSTEMI (non-ST elevated myocardial infarction) (HCC)   Essential hypertension   Paroxysmal atrial fibrillation (HCC)   Chronic kidney disease, stage 3a (HCC)   Type 2 diabetes mellitus with hyperlipidemia (HCC)  Resolved Problems:   * No resolved hospital problems. *  Hospital Course:  89 yo male with the past medical history of hypertension, hyperlipidemia, coronary artery disease, T2DM and atrial fibrillation who presented with aphasia. Patient was found having difficulty finding words lasting for about 20 minutes. Also had precordial chest pain. Blood pressure was 138/73. Troponin was elevated at 1131 and 1083. EKG showed left axis deviation, showed atrial fibrillation with no significant ST changes. CTA of the head and neck was positive for abnormal right vertebral artery, possible acute occlusion of slow retrograde flow. Very severe stenosis of the right subclavian artery. Brain MRI showed no acute intracranial abnormality.   Assessment and Plan:   TIA -Resolved; MRI negative for stroke -Neurology was consulted; recommended EEG to rule out seizure -EEG did not show epileptiform discharges -Continue Xarelto   NSTEMI -Presented with significantly elevated troponin, cardiology was consulted -No aggressive intervention planned as patient and family refused aggressive intervention -Echocardiogram showed no regional wall motion abnormalities, EF 55 to 60%; grade 1 diastolic dysfunction -Currently on IV heparin; IV  heparin we will switch to Xarelto -Cardiology recommends to continue with aspirin 81 mg , if he does not have bleeding problems -Will discharge on aspirin 81 mg p.o. daily   Chronic diastolic CHF -Echocardiogram shows grade 1 diastolic dysfunction,EF 55 to 56%    Moderate aortic valve stenosis -Echocardiogram shows severe calcification of the aortic valve -Aortic valve gradient 32 mmHg, aortic valve area 1.38 centimeters square -Patient not interested in TAVR   Hypertension -Blood pressure well-controlled -Continue metoprolol   Paroxysmal atrial fibrillation -Heart rate controlled with metoprolol -Continue Xarelto   Hyponatremia -Sodium 129 -Serum osmolality 294 -Urine osmolality 335   Hypomagnesemia -Replete   Diabetes mellitus type 2 -Continue home regimen   Goals of care -Palliative care consulted -Family not interested in hospice at this time -Will discharge home with home health PT/OT         Consultants: Cardiology Procedures performed:  Disposition: Home Diet recommendation:  Discharge Diet Orders (From admission, onward)     Start     Ordered   05/28/23 0000  Diet - low sodium heart healthy        05/28/23 1333           Carb modified diet DISCHARGE MEDICATION: Allergies as of 05/28/2023       Reactions   Alteplase Anaphylaxis   Codeine Other (See Comments)   Bad dreams   Diclofenac Sodium Diarrhea   Gemfibrozil Nausea Only   Other Reaction(s): GI Intolerance   Voltaren [diclofenac Sodium] Diarrhea        Medication List     STOP taking these medications    hydrochlorothiazide 25 MG tablet Commonly known as: HYDRODIURIL       TAKE these medications    Accu-Chek FastClix Lancets Misc

## 2023-05-31 ENCOUNTER — Telehealth: Payer: Self-pay

## 2023-05-31 NOTE — Transitions of Care (Post Inpatient/ED Visit) (Signed)
05/31/2023  Name: [name] MRN: [id number] DOB: 1930-06-28  Today's TOC FU Call Status: Today's TOC FU Call Status:: Successful TOC FU Call Completed TOC FU Call Complete Date: 05/31/23 Patient's Name and Date of Birth confirmed.  Transition Care Management Follow-up Telephone Call Date of Discharge: 05/28/23 Discharge Facility: [facility]) Type of Discharge: Inpatient Admission Primary Inpatient Discharge Diagnosis:: TIA, NSTEMI How have you been since you were released from the hospital?: Better (Patient notes he is doing pretty good, also spoke with Sherri the aide that takes care of his wife M-W-F.  One of the sons is there the other days.) Any questions or concerns?: No  Items Reviewed: Did you receive and understand the discharge instructions provided?: Yes Medications obtained,verified, and reconciled?: Yes (Medications Reviewed) Any new allergies since your discharge?: No Dietary orders reviewed?: No Do you have support at home?: Yes People in Home: child(ren), adult Name of Support/Comfort Primary Source: Francee Piccolo (son) and aide, Sherri  Medications Reviewed Today: Medications Reviewed Today     Reviewed by Jodelle Gross, RN (Case Manager) on 05/31/23 at 1353  Med List Status: <None>   Medication Order Taking? Sig Documenting Provider Last Dose Status Informant  Accu-Chek FastClix Lancets MISC [id number] Yes Check BS up to 4 TIMES DAILY Dx E11.9 Dettinger, Elige Radon, MD Taking Active Family Member, Pharmacy Records  acetaminophen (TYLENOL) 325 MG tablet [id number] Yes Take 650 mg by mouth every 6 (six) hours as needed for mild pain, fever or headache. [provider] Taking Active Family Member, Pharmacy Records  ADVAIR DISKUS 100-50 MCG/ACT AEPB [id number] Yes INHALE 1 PUFF INTO LUNGS EVERY 12 HOURS Dettinger, Elige Radon, MD Taking Active Family Member, Pharmacy Records  albuterol (VENTOLIN HFA) 108 (90 Base) MCG/ACT inhaler [id number] Yes INHALE 1 TO 2  PUFFS BY MOUTH EVERY 6 HOURS AS NEEDED FOR WHEEZING OR SHORTNESS OF BREATH Dettinger, Elige Radon, MD Taking Active Family Member, Pharmacy Records  aspirin EC 81 MG tablet [id number] Yes Take 1 tablet (81 mg total) by mouth daily. Swallow whole. Meredeth Ide, MD Taking Active   atorvastatin (LIPITOR) 80 MG tablet [id number] Yes Take 1 tablet (80 mg total) by mouth daily. Dettinger, Elige Radon, MD Taking Active Family Member, Pharmacy Records  benazepril (LOTENSIN) 20 MG tablet [id number] Yes Take 1 tablet (20 mg total) by mouth daily. Dettinger, Elige Radon, MD Taking Active Family Member, Pharmacy Records  blood glucose meter kit and supplies KIT [id number] Yes Dispense based on patient and insurance preference. Use four times daily as directed.  Diagnosis type 2 diabetes Dettinger, Elige Radon, MD Taking Active Family Member, Pharmacy Records  Cholecalciferol (VITAMIN D) 2000 units CAPS [id number] Yes Take 2,000 Units by mouth daily. [provider] Taking Active Family Member, Pharmacy Records  feeding supplement, GLUCERNA SHAKE, (GLUCERNA SHAKE) LIQD [id number] Yes Take 237 mLs by mouth 2 (two) times daily between meals. Elenora Gamma, MD Taking Active Family Member, Pharmacy Records  fish oil-omega-3 fatty acids 1000 MG capsule [id number] Yes Take 2 g by mouth every evening. [provider] Taking Active Family Member, Pharmacy Records  glucose blood (ACCU-CHEK GUIDE) test strip [id number] Yes USE 1 STRIP TO CHECK GLUCOSE UP TO 4 TIMES DAILY Dettinger, Elige Radon, MD Taking Active Family Member, Pharmacy Records  hydrALAZINE (APRESOLINE) 25 MG tablet [id number] Yes Take 1 tablet (25 mg total) by mouth daily as needed. Dettinger, Elige Radon, MD Taking Active Family Member, Pharmacy Records  metFORMIN (GLUCOPHAGE-XR) 500 MG 24 hr tablet [id number] Yes  Take 1 tablet (500 mg total) by mouth daily with breakfast. Dettinger, Elige Radon, MD Taking Active Family Member, Pharmacy Records  metoprolol tartrate  (LOPRESSOR) 50 MG tablet [id number] Yes Take 1 tablet (50 mg total) by mouth 2 (two) times daily. Meredeth Ide, MD Taking Active   nitroGLYCERIN (NITROSTAT) 0.4 MG SL tablet [id number] Yes USE AS DIRECTED Dettinger, Elige Radon, MD Taking Active Family Member, Pharmacy Records  pantoprazole (PROTONIX) 40 MG tablet [id number] Yes Take 1 tablet (40 mg total) by mouth daily. Dettinger, Elige Radon, MD Taking Active Family Member, Pharmacy Records  Rivaroxaban (XARELTO) 15 MG TABS tablet [id number] Yes Take 1 tablet (15 mg total) by mouth daily with supper. Meredeth Ide, MD Taking Active             Home Care and Equipment/Supplies: Were Home Health Services Ordered?: Yes Name of Home Health Agency:: [facility] Has Agency set up a time to come to your home?: No EMR reviewed for Home Health Orders:  [facility] [place], spoke with Wayne Both and she will call patient or his son) Any new equipment or medical supplies ordered?: No  Functional Questionnaire: Do you need assistance with bathing/showering or dressing?: Yes Do you need assistance with meal preparation?: Yes Do you need assistance with eating?: No Do you have difficulty maintaining continence: No Do you need assistance with getting out of bed/getting out of a chair/moving?: No Do you have difficulty managing or taking your medications?: Yes  Follow up appointments reviewed: PCP Follow-up appointment confirmed?: Yes Date of PCP follow-up appointment?: 06/09/23 Follow-up Provider: Dr. Louanne Skye Specialist [facility] Follow-up appointment confirmed?: NA Do you need transportation to your follow-up appointment?: No Do you understand care options if your condition(s) worsen?: Yes-patient verbalized understanding  SDOH Interventions Today    Flowsheet Row Most Recent Value  SDOH Interventions   Food Insecurity Interventions Intervention Not Indicated  Housing Interventions Intervention Not Indicated  Transportation Interventions Intervention Not  Indicated  Utilities Interventions Intervention Not Indicated      TOC Interventions Today    Flowsheet Row Most Recent Value  TOC Interventions   TOC Interventions Discussed/Reviewed TOC Interventions Discussed, TOC Interventions Reviewed, Arranged PCP follow up less than 12 days/Care Guide scheduled, Contacted Home Health RN/OT/PT      Jodelle Gross RN, BSN, CCM RN Care Manager  Transitions of Care  [facility]  [phone]

## 2023-06-01 DIAGNOSIS — E1122 Type 2 diabetes mellitus with diabetic chronic kidney disease: Secondary | ICD-10-CM | POA: Diagnosis not present

## 2023-06-01 DIAGNOSIS — I5032 Chronic diastolic (congestive) heart failure: Secondary | ICD-10-CM | POA: Diagnosis not present

## 2023-06-01 DIAGNOSIS — I130 Hypertensive heart and chronic kidney disease with heart failure and stage 1 through stage 4 chronic kidney disease, or unspecified chronic kidney disease: Secondary | ICD-10-CM | POA: Diagnosis not present

## 2023-06-01 DIAGNOSIS — I2510 Atherosclerotic heart disease of native coronary artery without angina pectoris: Secondary | ICD-10-CM | POA: Diagnosis not present

## 2023-06-01 DIAGNOSIS — I214 Non-ST elevation (NSTEMI) myocardial infarction: Secondary | ICD-10-CM | POA: Diagnosis not present

## 2023-06-02 DIAGNOSIS — I2510 Atherosclerotic heart disease of native coronary artery without angina pectoris: Secondary | ICD-10-CM | POA: Diagnosis not present

## 2023-06-02 DIAGNOSIS — I130 Hypertensive heart and chronic kidney disease with heart failure and stage 1 through stage 4 chronic kidney disease, or unspecified chronic kidney disease: Secondary | ICD-10-CM | POA: Diagnosis not present

## 2023-06-02 DIAGNOSIS — I214 Non-ST elevation (NSTEMI) myocardial infarction: Secondary | ICD-10-CM | POA: Diagnosis not present

## 2023-06-02 DIAGNOSIS — I5032 Chronic diastolic (congestive) heart failure: Secondary | ICD-10-CM | POA: Diagnosis not present

## 2023-06-02 DIAGNOSIS — E1122 Type 2 diabetes mellitus with diabetic chronic kidney disease: Secondary | ICD-10-CM | POA: Diagnosis not present

## 2023-06-08 DIAGNOSIS — E1122 Type 2 diabetes mellitus with diabetic chronic kidney disease: Secondary | ICD-10-CM | POA: Diagnosis not present

## 2023-06-08 DIAGNOSIS — I130 Hypertensive heart and chronic kidney disease with heart failure and stage 1 through stage 4 chronic kidney disease, or unspecified chronic kidney disease: Secondary | ICD-10-CM | POA: Diagnosis not present

## 2023-06-08 DIAGNOSIS — I214 Non-ST elevation (NSTEMI) myocardial infarction: Secondary | ICD-10-CM | POA: Diagnosis not present

## 2023-06-08 DIAGNOSIS — I5032 Chronic diastolic (congestive) heart failure: Secondary | ICD-10-CM | POA: Diagnosis not present

## 2023-06-08 DIAGNOSIS — I2510 Atherosclerotic heart disease of native coronary artery without angina pectoris: Secondary | ICD-10-CM | POA: Diagnosis not present

## 2023-06-09 ENCOUNTER — Ambulatory Visit: Payer: Medicare Other | Admitting: Family Medicine

## 2023-06-09 ENCOUNTER — Encounter: Payer: Self-pay | Admitting: Family Medicine

## 2023-06-09 VITALS — BP 115/62 | HR 86 | Ht 71.0 in | Wt 187.0 lb

## 2023-06-09 DIAGNOSIS — I252 Old myocardial infarction: Secondary | ICD-10-CM | POA: Diagnosis not present

## 2023-06-09 DIAGNOSIS — I350 Nonrheumatic aortic (valve) stenosis: Secondary | ICD-10-CM

## 2023-06-09 DIAGNOSIS — E871 Hypo-osmolality and hyponatremia: Secondary | ICD-10-CM

## 2023-06-09 DIAGNOSIS — Z8673 Personal history of transient ischemic attack (TIA), and cerebral infarction without residual deficits: Secondary | ICD-10-CM

## 2023-06-09 NOTE — Progress Notes (Signed)
BP 115/62   Pulse 86   Ht 5\' 11"  (1.803 m)   Wt 187 lb (84.8 kg)   SpO2 97%   BMI 26.08 kg/m    Subjective:   Patient ID: [name], male    DOB: 02-Oct-1929, 89 y.o.   MRN: [id number]  HPI: [name] is a 89 y.o. male presenting on 06/09/2023 for Hospitalization Follow-up   HPI Hospital follow-up and transition of care. Patient was in the hospital from 05/24/2023 until 05/28/2023.  Patient had presented to the hospital with aphasia.  Patient was diagnosed with TIA and NSTEMI and CHF and also had an echocardiogram showing moderate AV stenosis.  The difficulty finding words last about 20 minutes and also had chest pain same time.  He was also found to have severe stenosis of right subclavian artery.  Patient was continued on Xarelto and to start aspirin 81 mg after discharge.  Patient was discharged with home health PT and OT and palliative care. Patient was contacted on 05/31/2023 for a transition of care telephone call by Jodelle Gross, RN. Transition of care office visit today After leaving the hospital he still has occasional chest pain but not as significant as he was having and only last a few seconds after therapy and once he sits down it goes away quickly.  He does have nitro but has not had to use it because it does not last once he sits down to rest.  He has not seen his cardiologist recently but he has opted to go DNR and does not want any of the cardiac procedures or vascular procedures done after being seen in the hospital.  Patient had some speech problems initially but it passed pretty quickly after about 20 minutes and has not had not returned either.  He is taking aspirin and denies any bleeding along with his Xarelto that he was taking previously.  Relevant past medical, surgical, family and social history reviewed and updated as indicated. Interim medical history since our last visit reviewed. Allergies and medications reviewed and updated.  Review of Systems   Constitutional:  Negative for chills and fever.  Eyes:  Negative for visual disturbance.  Respiratory:  Negative for shortness of breath and wheezing.   Cardiovascular:  Positive for chest pain. Negative for leg swelling.  Musculoskeletal:  Negative for arthralgias, back pain and gait problem.  Skin:  Negative for rash.  All other systems reviewed and are negative.   Per HPI unless specifically indicated above   Allergies as of 06/09/2023       Reactions   Alteplase Anaphylaxis   Codeine Other (See Comments)   Bad dreams   Diclofenac Sodium Diarrhea   Gemfibrozil Nausea Only   Other Reaction(s): GI Intolerance   Voltaren [diclofenac Sodium] Diarrhea        Medication List        Accurate as of June 09, 2023 10:10 AM. If you have any questions, ask your nurse or doctor.          Accu-Chek FastClix Lancets Misc Check BS up to 4 TIMES DAILY Dx E11.9   Accu-Chek Guide test strip Generic drug: glucose blood USE 1 STRIP TO CHECK GLUCOSE UP TO 4 TIMES DAILY   acetaminophen 325 MG tablet Commonly known as: TYLENOL Take 650 mg by mouth every 6 (six) hours as needed for mild pain, fever or headache.   Advair Diskus 100-50 MCG/ACT Aepb Generic drug: fluticasone-salmeterol INHALE 1 PUFF INTO LUNGS EVERY 12 HOURS  albuterol 108 (90 Base) MCG/ACT inhaler Commonly known as: VENTOLIN HFA INHALE 1 TO 2 PUFFS BY MOUTH EVERY 6 HOURS AS NEEDED FOR WHEEZING OR SHORTNESS OF BREATH   aspirin EC 81 MG tablet Take 1 tablet (81 mg total) by mouth daily. Swallow whole.   atorvastatin 80 MG tablet Commonly known as: LIPITOR Take 1 tablet (80 mg total) by mouth daily.   benazepril 20 MG tablet Commonly known as: LOTENSIN Take 1 tablet (20 mg total) by mouth daily.   blood glucose meter kit and supplies Kit Dispense based on patient and insurance preference. Use four times daily as directed.  Diagnosis type 2 diabetes   feeding supplement (GLUCERNA SHAKE) Liqd Take 237  mLs by mouth 2 (two) times daily between meals.   fish oil-omega-3 fatty acids 1000 MG capsule Take 2 g by mouth every evening.   hydrALAZINE 25 MG tablet Commonly known as: APRESOLINE Take 1 tablet (25 mg total) by mouth daily as needed.   metFORMIN 500 MG 24 hr tablet Commonly known as: GLUCOPHAGE-XR Take 1 tablet (500 mg total) by mouth daily with breakfast.   metoprolol tartrate 50 MG tablet Commonly known as: LOPRESSOR Take 1 tablet (50 mg total) by mouth 2 (two) times daily.   nitroGLYCERIN 0.4 MG SL tablet Commonly known as: NITROSTAT USE AS DIRECTED   pantoprazole 40 MG tablet Commonly known as: PROTONIX Take 1 tablet (40 mg total) by mouth daily.   Rivaroxaban 15 MG Tabs tablet Commonly known as: XARELTO Take 1 tablet (15 mg total) by mouth daily with supper.   Vitamin D 50 MCG (2000 UT) Caps Take 2,000 Units by mouth daily.         Objective:   BP 115/62   Pulse 86   Ht 5\' 11"  (1.803 m)   Wt 187 lb (84.8 kg)   SpO2 97%   BMI 26.08 kg/m   Wt Readings from Last 3 Encounters:  06/09/23 187 lb (84.8 kg)  05/28/23 178 lb 12.7 oz (81.1 kg)  04/21/23 188 lb 3.2 oz (85.4 kg)    Physical Exam Vitals and nursing note reviewed.  Constitutional:      General: He is not in acute distress.    Appearance: He is well-developed. He is not diaphoretic.  Eyes:     General: No scleral icterus.    Conjunctiva/sclera: Conjunctivae normal.  Neck:     Thyroid: No thyromegaly.  Cardiovascular:     Rate and Rhythm: Normal rate and regular rhythm.     Heart sounds: Normal heart sounds. No murmur heard. Pulmonary:     Effort: Pulmonary effort is normal. No respiratory distress.     Breath sounds: Normal breath sounds. No wheezing.  Musculoskeletal:        General: No swelling. Normal range of motion.     Cervical back: Neck supple.  Lymphadenopathy:     Cervical: No cervical adenopathy.  Skin:    General: Skin is warm and dry.     Findings: No rash.   Neurological:     Mental Status: He is alert and oriented to person, place, and time.     Coordination: Coordination normal.  Psychiatric:        Behavior: Behavior normal.       Assessment & Plan:   Problem List Items Addressed This Visit       Cardiovascular and Mediastinum   Aortic valve stenosis, nonrheumatic - Primary   Relevant Orders   CBC with Differential/Platelet   CMP14+EGFR  Other   Hypomagnesemia   Relevant Orders   CBC with Differential/Platelet   CMP14+EGFR   Magnesium   Other Visit Diagnoses     History of TIA (transient ischemic attack)       History of non-ST elevation myocardial infarction (NSTEMI)       Hyponatremia       Relevant Orders   CBC with Differential/Platelet   CMP14+EGFR       Signed DNR for patient today.  Recommended he still follow-up with cardiology but it sounds like he wants to go DNR and does not want to do any cardiac procedures.  Will check blood work today.  He is doing home health therapy as well. Follow up plan: Return if symptoms worsen or fail to improve, for Keep diabetic appointment in 1 month.  Counseling provided for all of the vaccine components Orders Placed This Encounter  Procedures   CBC with Differential/Platelet   CMP14+EGFR   Magnesium    Arville Care, MD [facility] 06/09/2023, 10:10 AM

## 2023-06-10 ENCOUNTER — Ambulatory Visit: Payer: Medicare Other

## 2023-06-10 DIAGNOSIS — I214 Non-ST elevation (NSTEMI) myocardial infarction: Secondary | ICD-10-CM

## 2023-06-10 DIAGNOSIS — J4540 Moderate persistent asthma, uncomplicated: Secondary | ICD-10-CM | POA: Diagnosis not present

## 2023-06-10 DIAGNOSIS — I130 Hypertensive heart and chronic kidney disease with heart failure and stage 1 through stage 4 chronic kidney disease, or unspecified chronic kidney disease: Secondary | ICD-10-CM | POA: Diagnosis not present

## 2023-06-10 DIAGNOSIS — I480 Paroxysmal atrial fibrillation: Secondary | ICD-10-CM

## 2023-06-10 DIAGNOSIS — I2510 Atherosclerotic heart disease of native coronary artery without angina pectoris: Secondary | ICD-10-CM

## 2023-06-10 DIAGNOSIS — E1122 Type 2 diabetes mellitus with diabetic chronic kidney disease: Secondary | ICD-10-CM

## 2023-06-10 DIAGNOSIS — E785 Hyperlipidemia, unspecified: Secondary | ICD-10-CM

## 2023-06-10 DIAGNOSIS — N1831 Chronic kidney disease, stage 3a: Secondary | ICD-10-CM

## 2023-06-10 DIAGNOSIS — I5032 Chronic diastolic (congestive) heart failure: Secondary | ICD-10-CM | POA: Diagnosis not present

## 2023-06-10 DIAGNOSIS — E1169 Type 2 diabetes mellitus with other specified complication: Secondary | ICD-10-CM

## 2023-06-10 LAB — CMP14+EGFR
ALT: 14 IU/L (ref 0–44)
AST: 17 IU/L (ref 0–40)
Albumin: 4.3 g/dL (ref 3.6–4.6)
Alkaline Phosphatase: 106 [IU]/L (ref 44–121)
BUN/Creatinine Ratio: 15 (ref 10–24)
BUN: 19 mg/dL (ref 10–36)
Bilirubin Total: 1 mg/dL (ref 0.0–1.2)
CO2: 22 mmol/L (ref 20–29)
Calcium: 9.3 mg/dL (ref 8.6–10.2)
Chloride: 93 mmol/L — ABNORMAL LOW (ref 96–106)
Creatinine, Ser: 1.29 mg/dL — ABNORMAL HIGH (ref 0.76–1.27)
Globulin, Total: 2.3 g/dL (ref 1.5–4.5)
Glucose: 290 mg/dL — ABNORMAL HIGH (ref 70–99)
Potassium: 4.4 mmol/L (ref 3.5–5.2)
Sodium: 133 mmol/L — ABNORMAL LOW (ref 134–144)
Total Protein: 6.6 g/dL (ref 6.0–8.5)
eGFR: 52 mL/min/{1.73_m2} — ABNORMAL LOW (ref >59)

## 2023-06-10 LAB — CBC WITH DIFFERENTIAL/PLATELET
Basophils Absolute: 0 10*3/uL (ref 0.0–0.2)
Basos: 0 %
EOS (ABSOLUTE): 0.2 10*3/uL (ref 0.0–0.4)
Eos: 2 %
Hematocrit: 36.8 % — ABNORMAL LOW (ref 37.5–51.0)
Hemoglobin: 12.6 g/dL — ABNORMAL LOW (ref 13.0–17.7)
Immature Grans (Abs): 0.1 10*3/uL (ref 0.0–0.1)
Immature Granulocytes: 1 %
Lymphocytes Absolute: 1.4 10*3/uL (ref 0.7–3.1)
Lymphs: 17 %
MCH: 34.5 pg — ABNORMAL HIGH (ref 26.6–33.0)
MCHC: 34.2 g/dL (ref 31.5–35.7)
MCV: 101 fL — ABNORMAL HIGH (ref 79–97)
Monocytes Absolute: 0.7 10*3/uL (ref 0.1–0.9)
Monocytes: 8 %
Neutrophils Absolute: 6.2 10*3/uL (ref 1.4–7.0)
Neutrophils: 72 %
Platelets: 235 10*3/uL (ref 150–450)
RBC: 3.65 x10E6/uL — ABNORMAL LOW (ref 4.14–5.80)
RDW: 11.4 % — ABNORMAL LOW (ref 11.6–15.4)
WBC: 8.5 10*3/uL (ref 3.4–10.8)

## 2023-06-10 LAB — MAGNESIUM: Magnesium: 1.5 mg/dL — ABNORMAL LOW (ref 1.6–2.3)

## 2023-06-16 DIAGNOSIS — C44329 Squamous cell carcinoma of skin of other parts of face: Secondary | ICD-10-CM | POA: Diagnosis not present

## 2023-06-16 DIAGNOSIS — C4491 Basal cell carcinoma of skin, unspecified: Secondary | ICD-10-CM | POA: Diagnosis not present

## 2023-06-16 DIAGNOSIS — C4492 Squamous cell carcinoma of skin, unspecified: Secondary | ICD-10-CM | POA: Diagnosis not present

## 2023-06-17 ENCOUNTER — Emergency Department (HOSPITAL_COMMUNITY)
Admission: EM | Admit: 2023-06-17 | Discharge: 2023-06-17 | Disposition: A | Discharge status: Home / Self Care | Payer: Medicare Other | Attending: Emergency Medicine | Admitting: Emergency Medicine

## 2023-06-17 ENCOUNTER — Encounter (HOSPITAL_COMMUNITY): Payer: Self-pay

## 2023-06-17 ENCOUNTER — Telehealth: Payer: Self-pay

## 2023-06-17 ENCOUNTER — Telehealth: Payer: Self-pay | Admitting: Family Medicine

## 2023-06-17 ENCOUNTER — Other Ambulatory Visit: Payer: Self-pay

## 2023-06-17 ENCOUNTER — Emergency Department (HOSPITAL_COMMUNITY): Payer: Medicare Other

## 2023-06-17 DIAGNOSIS — I5032 Chronic diastolic (congestive) heart failure: Secondary | ICD-10-CM

## 2023-06-17 DIAGNOSIS — H5789 Other specified disorders of eye and adnexa: Secondary | ICD-10-CM | POA: Diagnosis not present

## 2023-06-17 DIAGNOSIS — E119 Type 2 diabetes mellitus without complications: Secondary | ICD-10-CM | POA: Insufficient documentation

## 2023-06-17 DIAGNOSIS — Z7901 Long term (current) use of anticoagulants: Secondary | ICD-10-CM | POA: Insufficient documentation

## 2023-06-17 DIAGNOSIS — I672 Cerebral atherosclerosis: Secondary | ICD-10-CM

## 2023-06-17 DIAGNOSIS — I209 Angina pectoris, unspecified: Secondary | ICD-10-CM | POA: Diagnosis not present

## 2023-06-17 DIAGNOSIS — I200 Unstable angina: Secondary | ICD-10-CM | POA: Diagnosis not present

## 2023-06-17 DIAGNOSIS — I252 Old myocardial infarction: Secondary | ICD-10-CM

## 2023-06-17 DIAGNOSIS — Z7984 Long term (current) use of oral hypoglycemic drugs: Secondary | ICD-10-CM | POA: Insufficient documentation

## 2023-06-17 DIAGNOSIS — Z85828 Personal history of other malignant neoplasm of skin: Secondary | ICD-10-CM | POA: Diagnosis not present

## 2023-06-17 DIAGNOSIS — M25511 Pain in right shoulder: Secondary | ICD-10-CM | POA: Diagnosis not present

## 2023-06-17 DIAGNOSIS — I509 Heart failure, unspecified: Secondary | ICD-10-CM | POA: Diagnosis not present

## 2023-06-17 DIAGNOSIS — M542 Cervicalgia: Secondary | ICD-10-CM | POA: Insufficient documentation

## 2023-06-17 DIAGNOSIS — Z7982 Long term (current) use of aspirin: Secondary | ICD-10-CM | POA: Diagnosis not present

## 2023-06-17 DIAGNOSIS — M79601 Pain in right arm: Secondary | ICD-10-CM | POA: Diagnosis not present

## 2023-06-17 DIAGNOSIS — I2089 Other forms of angina pectoris: Secondary | ICD-10-CM

## 2023-06-17 DIAGNOSIS — R0602 Shortness of breath: Secondary | ICD-10-CM | POA: Diagnosis present

## 2023-06-17 DIAGNOSIS — M549 Dorsalgia, unspecified: Secondary | ICD-10-CM | POA: Diagnosis not present

## 2023-06-17 DIAGNOSIS — Z8673 Personal history of transient ischemic attack (TIA), and cerebral infarction without residual deficits: Secondary | ICD-10-CM

## 2023-06-17 DIAGNOSIS — R0989 Other specified symptoms and signs involving the circulatory and respiratory systems: Secondary | ICD-10-CM | POA: Diagnosis not present

## 2023-06-17 DIAGNOSIS — I25110 Atherosclerotic heart disease of native coronary artery with unstable angina pectoris: Secondary | ICD-10-CM

## 2023-06-17 NOTE — ED Notes (Signed)
ED Provider at bedside. 

## 2023-06-17 NOTE — ED Provider Notes (Signed)
[place] EMERGENCY DEPARTMENT AT [facility] Provider Note   CSN: [id number] Arrival date & time: 06/17/23  1001     History  No chief complaint on file.   [name] is a 89 y.o. male.  Pt is a 89y/o male with hx of TIA and NSTEMI and CHF at the end of oct on xarelto and ASA,moderate AV stenosis, severe stenosis of right subclavian artery, DM, basal cell carcinoma of the face which was removed yesterday who is presenting today with EMS after an episode of neck pain radiating into his right shoulder.  Patient reports that he felt okay when he got out of bed this morning but around 8:00 when he was walking to get breakfast is when the discomfort started.  He reports it radiated down into his arm and he felt unsteady when it started.  He is unsure if he felt more short of breath than baseline but reports he did take a nitroglycerin and the pain has now almost completely resolved.  He denies any chest pain.  He has not had recent cough, fever, leg swelling.  No nausea or vomiting.  He has been compliant with his medications.  He has noticed redness and swelling around his right eye today after the surgery yesterday.        Home Medications Prior to Admission medications   Medication Sig Start Date End Date Taking? Authorizing Provider  Accu-Chek FastClix Lancets MISC Check BS up to 4 TIMES DAILY Dx E11.9 07/15/20   Dettinger, Elige Radon, MD  acetaminophen (TYLENOL) 325 MG tablet Take 650 mg by mouth every 6 (six) hours as needed for mild pain, fever or headache.    [provider]  ADVAIR DISKUS 100-50 MCG/ACT AEPB INHALE 1 PUFF INTO LUNGS EVERY 12 HOURS 04/14/23   Dettinger, Elige Radon, MD  albuterol (VENTOLIN HFA) 108 (90 Base) MCG/ACT inhaler INHALE 1 TO 2 PUFFS BY MOUTH EVERY 6 HOURS AS NEEDED FOR WHEEZING OR SHORTNESS OF BREATH 04/15/23   Dettinger, Elige Radon, MD  aspirin EC 81 MG tablet Take 1 tablet (81 mg total) by mouth daily. Swallow whole. 05/28/23 05/27/24   Meredeth Ide, MD  atorvastatin (LIPITOR) 80 MG tablet Take 1 tablet (80 mg total) by mouth daily. 01/14/23   Dettinger, Elige Radon, MD  benazepril (LOTENSIN) 20 MG tablet Take 1 tablet (20 mg total) by mouth daily. 01/14/23   Dettinger, Elige Radon, MD  blood glucose meter kit and supplies KIT Dispense based on patient and insurance preference. Use four times daily as directed.  Diagnosis type 2 diabetes 05/10/19   Dettinger, Elige Radon, MD  Cholecalciferol (VITAMIN D) 2000 units CAPS Take 2,000 Units by mouth daily.    [provider]  feeding supplement, GLUCERNA SHAKE, (GLUCERNA SHAKE) LIQD Take 237 mLs by mouth 2 (two) times daily between meals. 12/13/15   Elenora Gamma, MD  fish oil-omega-3 fatty acids 1000 MG capsule Take 2 g by mouth every evening.    [provider]  glucose blood (ACCU-CHEK GUIDE) test strip USE 1 STRIP TO CHECK GLUCOSE UP TO 4 TIMES DAILY 06/19/22   Dettinger, Elige Radon, MD  hydrALAZINE (APRESOLINE) 25 MG tablet Take 1 tablet (25 mg total) by mouth daily as needed. 01/14/23   Dettinger, Elige Radon, MD  metFORMIN (GLUCOPHAGE-XR) 500 MG 24 hr tablet Take 1 tablet (500 mg total) by mouth daily with breakfast. 01/14/23   Dettinger, Elige Radon, MD  metoprolol tartrate (LOPRESSOR) 50 MG tablet Take  1 tablet (50 mg total) by mouth 2 (two) times daily. 05/28/23   Meredeth Ide, MD  nitroGLYCERIN (NITROSTAT) 0.4 MG SL tablet USE AS DIRECTED 06/08/22   Dettinger, Elige Radon, MD  pantoprazole (PROTONIX) 40 MG tablet Take 1 tablet (40 mg total) by mouth daily. 01/14/23   Dettinger, Elige Radon, MD  Rivaroxaban (XARELTO) 15 MG TABS tablet Take 1 tablet (15 mg total) by mouth daily with supper. 05/28/23   Meredeth Ide, MD      Allergies    Alteplase, Codeine, Diclofenac sodium, Gemfibrozil, and Voltaren [diclofenac sodium]    Review of Systems   Review of Systems  Physical Exam Updated Vital Signs BP (!) 92/56 (BP Location: Right Arm)   Pulse 95   Temp 98.5 F (36.9 C) (Oral)    Resp 18   SpO2 96%  Physical Exam Vitals and nursing note reviewed.  Constitutional:      General: He is not in acute distress.    Appearance: He is well-developed.  HENT:     Head: Normocephalic and atraumatic.  Eyes:     General:        Right eye: Discharge present.        Left eye: Discharge present.    Pupils: Pupils are equal, round, and reactive to light.     Comments: Wound sutured lateral to the right eye with surrounding erythema and erythema around the eye.  Discharge in bilateral eyes with crusting and matting of the lashes  Cardiovascular:     Rate and Rhythm: Regular rhythm. Tachycardia present.     Heart sounds: Murmur heard.     Comments: Blowing systolic murmur Pulmonary:     Effort: Pulmonary effort is normal. No respiratory distress.     Breath sounds: Normal breath sounds. No wheezing or rales.  Abdominal:     General: There is no distension.     Palpations: Abdomen is soft.     Tenderness: There is no abdominal tenderness. There is no guarding or rebound.  Musculoskeletal:        General: No tenderness. Normal range of motion.     Cervical back: Normal range of motion and neck supple.     Right lower leg: No edema.     Left lower leg: No edema.     Comments: HEaling ecchymosis over the right humerus but full range of motion of the arm.  Minimal tenderness with palpation over C7.  Full flexion extension of the neck without pain  Skin:    General: Skin is warm and dry.     Findings: No erythema or rash.  Neurological:     Mental Status: He is alert and oriented to person, place, and time. Mental status is at baseline.  Psychiatric:        Behavior: Behavior normal.     ED Results / Procedures / Treatments   Labs (all labs ordered are listed, but only abnormal results are displayed) Labs Reviewed  CBC WITH DIFFERENTIAL/PLATELET  COMPREHENSIVE METABOLIC PANEL  BRAIN NATRIURETIC PEPTIDE  TROPONIN I (HIGH SENSITIVITY)    EKG EKG  Interpretation Date/Time:  Thursday June 17 2023 10:18:28 EST Ventricular Rate:  94 PR Interval:  191 QRS Duration:  136 QT Interval:  388 QTC Calculation: 486 R Axis:   257  Text Interpretation: Sinus rhythm RBBB and LAFB Confirmed by Gwyneth Sprout ([id number]) on 06/17/2023 10:22:54 AM  Radiology No results found.  Procedures Procedures    Medications Ordered in ED Medications -  No data to display  ED Course/ Medical Decision Making/ A&P                                 Medical Decision Making Amount and/or Complexity of Data Reviewed Labs: ordered. Radiology: ordered and independent interpretation performed. Decision-making details documented in ED Course. ECG/medicine tests: ordered and independent interpretation performed. Decision-making details documented in ED Course.  Pt with multiple medical problems and comorbidities and presenting today with a complaint that caries a high risk for morbidity and mortality.  Patient here today with symptoms concerning for atypical ACS that started today at 8 AM improved after nitroglycerin.  Reports he is symptom-free at this time.  However patient also had surgery yesterday and has significant amount of erythema and swelling to the right side of the face and preauricular.  Patient denies any shortness of breath has no swelling down in the neck or trouble swallowing or breathing.  Patient has numerous cardiac risk factors in addition to aortic stenosis but does not want to undergo any valves or surgeries that cardiology has offered due to the concern that it would be fatal for him.  He is on Xarelto and aspirin at this time.  Patient had NSTEMI and CHF at the end of October but since being home has been compliant with his medications and have been doing okay.  Patient's heart rate on continuous cardiac monitoring is in the 90s today.  I independently interpreted patient's EKG which appears to be in sinus rhythm currently with a right bundle  branch block which is unchanged.  Patient in the past has had atrial fibrillation.  Will keep on continuous monitoring at this time.  11:02 AM After initial evaluation patient's son in law is now present and reports that they really do not want any further evaluation.  When they were here last hospice got involved but they admit they never called him when he got back home because he was doing okay.  Patient's daughter is his POA and reports that they do not want any further testing.  When talking with the patient about this he agrees that he still is not interested in getting a heart valve and he does not wish to stay at the hospital and he would like to go home.  I called Norm Parcel with hospice services and they are going to contact the patient and come out later this afternoon for evaluation to see how they can be of help to he and his family.  Patient is comfortable with this plan.  His son-in-law at bedside states that he can take him home.  They do not want any lab work done. I have independently visualized and interpreted pt's images today.  Chest x-ray today shows no evidence of significant fluid overload or pneumothorax.          Final Clinical Impression(s) / ED Diagnoses Final diagnoses:  [facility])    Rx / DC Orders ED Discharge Orders     None         Gwyneth Sprout, MD 06/17/23 1102

## 2023-06-17 NOTE — Discharge Instructions (Addendum)
The event you had today was most likely related to your heart and your narrow valve.

## 2023-06-17 NOTE — Telephone Encounter (Signed)
Telephone message from [place]. Please advise.   Copied from CRM [id number]. Topic: Referral - Request for Referral >> Jun 17, 2023  1:48 PM Larwance Sachs wrote: Reason For CRM: Eunice Blase called from [facility] supportive care regarding patient getting a referral for hospice, stated she will wait for a call back  Call back at [phone]

## 2023-06-17 NOTE — ED Triage Notes (Signed)
Bib ems form home; woke this am with pain in r scapula, shoulder, and neck; no sob, no trauma; skin cancer removed from r face yesterday; pt took nitroglycerin at home pta, pain resolved; 126/ 68, HR 100, 95% RA, cbg 249, RR 18; ems reports bp will drop to 90s systolic and then go back to 120s

## 2023-06-17 NOTE — Telephone Encounter (Signed)
Caregiver made aware that BP readings that pt dropped off were mainly good. He does need to monitor to make sure not consistently elevated.  Readings range from lower 100-s- upper 140's over 60's -mid 80's.

## 2023-06-18 NOTE — Telephone Encounter (Signed)
[name] with [facility] made aware of referral to Hospice and providers there will manage pts care once admitted.

## 2023-06-18 NOTE — Telephone Encounter (Signed)
Placed referral for hospice

## 2023-06-21 ENCOUNTER — Telehealth: Payer: Self-pay | Admitting: Family Medicine

## 2023-06-21 NOTE — Telephone Encounter (Signed)
Copied from CRM [id number]. Topic: Clinical - Home Health Verbal Orders >> Jun 21, 2023  9:38 AM Adelina Mings wrote: Reason for CRM: requesting to end home health patient is in hospice

## 2023-06-21 NOTE — Telephone Encounter (Signed)
Yes fine to go ahead and give her verbal and home health because patient is on hospice.

## 2023-06-21 NOTE — Telephone Encounter (Signed)
LMOVM VO given to discharge from [facility] d/t pt going to Hospice

## 2023-06-22 ENCOUNTER — Telehealth: Payer: Self-pay | Admitting: Family Medicine

## 2023-06-22 NOTE — Telephone Encounter (Signed)
Message for Dr Dettinger  Copied from CRM [id number]. Topic: General - Other >> Jun 22, 2023 12:11 PM [name] wrote: Reason for CRM: PT's son wants to let Dr.Dettinger know that his father is on hospice care and likely to pass soon. Pt's son asks that this message makes its way to Dr.Dettinger.

## 2023-06-23 NOTE — Telephone Encounter (Signed)
Okay thanks for the information, yes I did get the information from hospice to do basic orders.

## 2023-06-25 ENCOUNTER — Telehealth: Payer: Self-pay | Admitting: Family Medicine

## 2023-07-04 NOTE — Telephone Encounter (Signed)
Okay thanks for letting us know, let them know that our prayers to the family and well wishes and blessings to them as well.  Let us know if they need anything in the process.

## 2023-07-04 NOTE — Telephone Encounter (Signed)
Copied from CRM [id number]. Topic: General - Deceased Patient >> 06-29-23 10:07 AM Gaetano Hawthorne wrote: Name of caller: Adrienean from [facility] supportive care (hospice)  Date of death: 29-Jun-2023 @ 5:33 AM - died at home with son by his side, no pain and no suffering - nurse was also there for them.  Name of funeral home: [employer] - in [place]  Phone number of funeral home: [phone]  Provider that needs to sign form: N/A  Timeline for signing: N/A - provider with hospice was able to sign and complete paperwork

## 2023-07-04 DEATH — deceased

## 2023-07-22 ENCOUNTER — Ambulatory Visit: Payer: Medicare Other | Admitting: Family Medicine

## 2023-10-27 ENCOUNTER — Ambulatory Visit: Payer: Medicare Other | Admitting: Cardiology
